# Patient Record
Sex: Female | Born: 1995 | Race: White | Hispanic: No | Marital: Married | Smoking: Never smoker
Health system: Southern US, Community
[De-identification: ages and names within clinical notes are randomized; demographics above are authoritative.]

## PROBLEM LIST (undated history)

## (undated) ENCOUNTER — Inpatient Hospital Stay (HOSPITAL_COMMUNITY): Payer: Self-pay

## (undated) DIAGNOSIS — T8859XA Other complications of anesthesia, initial encounter: Secondary | ICD-10-CM

## (undated) DIAGNOSIS — F419 Anxiety disorder, unspecified: Secondary | ICD-10-CM

## (undated) DIAGNOSIS — N281 Cyst of kidney, acquired: Secondary | ICD-10-CM

## (undated) DIAGNOSIS — E611 Iron deficiency: Secondary | ICD-10-CM

## (undated) DIAGNOSIS — T4145XA Adverse effect of unspecified anesthetic, initial encounter: Secondary | ICD-10-CM

## (undated) DIAGNOSIS — J45909 Unspecified asthma, uncomplicated: Secondary | ICD-10-CM

## (undated) DIAGNOSIS — N96 Recurrent pregnancy loss: Secondary | ICD-10-CM

## (undated) DIAGNOSIS — R760 Raised antibody titer: Secondary | ICD-10-CM

## (undated) DIAGNOSIS — F329 Major depressive disorder, single episode, unspecified: Secondary | ICD-10-CM

## (undated) DIAGNOSIS — Q899 Congenital malformation, unspecified: Secondary | ICD-10-CM

## (undated) DIAGNOSIS — T7840XA Allergy, unspecified, initial encounter: Secondary | ICD-10-CM

## (undated) DIAGNOSIS — R002 Palpitations: Secondary | ICD-10-CM

## (undated) DIAGNOSIS — E162 Hypoglycemia, unspecified: Secondary | ICD-10-CM

## (undated) DIAGNOSIS — Z8489 Family history of other specified conditions: Secondary | ICD-10-CM

## (undated) DIAGNOSIS — F32A Depression, unspecified: Secondary | ICD-10-CM

## (undated) DIAGNOSIS — D649 Anemia, unspecified: Secondary | ICD-10-CM

## (undated) DIAGNOSIS — D689 Coagulation defect, unspecified: Secondary | ICD-10-CM

## (undated) DIAGNOSIS — I499 Cardiac arrhythmia, unspecified: Secondary | ICD-10-CM

## (undated) DIAGNOSIS — R519 Headache, unspecified: Secondary | ICD-10-CM

## (undated) DIAGNOSIS — Z5189 Encounter for other specified aftercare: Secondary | ICD-10-CM

## (undated) DIAGNOSIS — R51 Headache: Secondary | ICD-10-CM

## (undated) DIAGNOSIS — K219 Gastro-esophageal reflux disease without esophagitis: Secondary | ICD-10-CM

## (undated) DIAGNOSIS — M792 Neuralgia and neuritis, unspecified: Secondary | ICD-10-CM

## (undated) HISTORY — PX: ABLATION: SHX5711

## (undated) HISTORY — DX: Major depressive disorder, single episode, unspecified: F32.9

## (undated) HISTORY — DX: Raised antibody titer: R76.0

## (undated) HISTORY — DX: Recurrent pregnancy loss: N96

## (undated) HISTORY — DX: Anxiety disorder, unspecified: F41.9

## (undated) HISTORY — DX: Anemia, unspecified: D64.9

## (undated) HISTORY — DX: Allergy, unspecified, initial encounter: T78.40XA

## (undated) HISTORY — DX: Coagulation defect, unspecified: D68.9

## (undated) HISTORY — DX: Neuralgia and neuritis, unspecified: M79.2

## (undated) HISTORY — DX: Iron deficiency: E61.1

## (undated) HISTORY — DX: Cyst of kidney, acquired: N28.1

## (undated) HISTORY — DX: Encounter for other specified aftercare: Z51.89

## (undated) HISTORY — PX: WISDOM TOOTH EXTRACTION: SHX21

## (undated) HISTORY — DX: Depression, unspecified: F32.A

---
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
## 2016-02-09 DIAGNOSIS — F411 Generalized anxiety disorder: Secondary | ICD-10-CM | POA: Insufficient documentation

## 2016-02-09 DIAGNOSIS — F330 Major depressive disorder, recurrent, mild: Secondary | ICD-10-CM | POA: Insufficient documentation

## 2016-04-22 DIAGNOSIS — N912 Amenorrhea, unspecified: Secondary | ICD-10-CM | POA: Insufficient documentation

## 2016-07-02 ENCOUNTER — Encounter: Payer: Self-pay | Admitting: Cardiology

## 2016-07-04 ENCOUNTER — Encounter: Payer: Self-pay | Admitting: *Deleted

## 2016-07-05 ENCOUNTER — Encounter: Payer: Self-pay | Admitting: *Deleted

## 2016-07-05 ENCOUNTER — Ambulatory Visit: Payer: Self-pay | Admitting: Cardiology

## 2016-07-10 ENCOUNTER — Encounter: Payer: Self-pay | Admitting: Cardiology

## 2016-07-10 ENCOUNTER — Ambulatory Visit (INDEPENDENT_AMBULATORY_CARE_PROVIDER_SITE_OTHER): Payer: BLUE CROSS/BLUE SHIELD | Admitting: Cardiology

## 2016-07-10 VITALS — BP 117/73 | HR 97 | Ht 64.0 in | Wt 122.0 lb

## 2016-07-10 DIAGNOSIS — R002 Palpitations: Secondary | ICD-10-CM | POA: Diagnosis not present

## 2016-07-10 DIAGNOSIS — Z136 Encounter for screening for cardiovascular disorders: Secondary | ICD-10-CM | POA: Diagnosis not present

## 2016-07-10 NOTE — Patient Instructions (Signed)
Your physician recommends that you schedule a follow-up appointment in: 6 weeks with Dr. Wyline MoodBranch  Your physician recommends that you continue on your current medications as directed. Please refer to the Current Medication list given to you today.  Your physician has recommended that you wear an event monitor FOR 30 DAYS. Event monitors are medical devices that record the heart's electrical activity. Doctors most often us these monitors to diagnose arrhythmias. Arrhythmias are problems with the speed or rhythm of the heartbeat. The monitor is a small, portable device. You can wear one while you do your normal daily activities. This is usually used to diagnose what is causing palpitations/syncope (passing out).  Thank you for choosing [facility]!!

## 2016-07-10 NOTE — Progress Notes (Signed)
        Clinical Summary [name] is a 21 y.o.female seen today as a new patient, she is referred by Dr Dimas AguasHoward for palpitations.   1. Palpitations. - started about 1 year ago. Can have episodes at rest or with activity. Has checked her heart rates during episodes and noted as high as  175 bpm, sharp pain left sided chest pain. Feels weak, drained. Lasts a few minutes - occurs sporadically, difficult to calculate frequency.  - no caffeine, no EtoH.    Past Medical History  Diagnosis Date  . Anxiety and depression      No Known Allergies   Current Outpatient Prescriptions  Medication Sig Dispense Refill  . busPIRone (BUSPAR) 5 MG tablet Take 5 mg by mouth 2 (two) times daily.    Marland Kitchen. FLUoxetine (PROZAC) 20 MG capsule Take 20 mg by mouth daily.  3   No current facility-administered medications for this visit.     No past surgical history on file.   No Known Allergies    No family history on file.   Social History [name] reports that she has never smoked. She has never used smokeless tobacco. [name] has no alcohol history on file.   Review of Systems CONSTITUTIONAL: No weight loss, fever, chills, weakness or fatigue.  HEENT: Eyes: No visual loss, blurred vision, double vision or yellow sclerae.No hearing loss, sneezing, congestion, runny nose or sore throat.  SKIN: No rash or itching.  CARDIOVASCULAR: per HPI RESPIRATORY: No shortness of breath, cough or sputum.  GASTROINTESTINAL: No anorexia, nausea, vomiting or diarrhea. No abdominal pain or blood.  GENITOURINARY: No burning on urination, no polyuria NEUROLOGICAL: No headache, dizziness, syncope, paralysis, ataxia, numbness or tingling in the extremities. No change in bowel or bladder control.  MUSCULOSKELETAL: No muscle, back pain, joint pain or stiffness.  LYMPHATICS: No enlarged nodes. No history of splenectomy.  PSYCHIATRIC: No history of depression or anxiety.  ENDOCRINOLOGIC: No reports of sweating,  cold or heat intolerance. No polyuria or polydipsia.  Marland Kitchen.   Physical Examination Filed Vitals:   07/10/16 1446  BP: 117/73  Pulse: 97   Filed Weights   07/10/16 1446  Weight: 122 lb (55.339 kg)    Gen: resting comfortably, no acute distress HEENT: no scleral icterus, pupils equal round and reactive, no palptable cervical adenopathy,  CV: RRR, no m/r/g,no jvd Resp: Clear to auscultation bilaterally GI: abdomen is soft, non-tender, non-distended, normal bowel sounds, no hepatosplenomegaly MSK: extremities are warm, no edema.  Skin: warm, no rash Neuro:  no focal deficits Psych: appropriate affect       Assessment and Plan  1. Palpitations - EKG in clinic shows NSR, normal intervals without preexcitation - we will obtain 30 day event monitor to further evaluate her symptoms      Antoine PocheJonathan F. Iren Whipp, M.D

## 2016-07-16 ENCOUNTER — Ambulatory Visit (INDEPENDENT_AMBULATORY_CARE_PROVIDER_SITE_OTHER): Payer: BLUE CROSS/BLUE SHIELD

## 2016-07-16 DIAGNOSIS — R002 Palpitations: Secondary | ICD-10-CM

## 2016-08-21 ENCOUNTER — Telehealth: Payer: Self-pay | Admitting: *Deleted

## 2016-08-21 NOTE — Telephone Encounter (Signed)
Pt aware and would like to keep 9/8 appt and discuss results with Dr. Wyline MoodBranch prior to starting new medication

## 2016-08-21 NOTE — Telephone Encounter (Signed)
-----   Message from Lesle ChrisAngela G Hill, LPN sent at 1/61/09608/21/2017  3:44 PM EDT -----   ----- Message ----- From: Antoine PocheJonathan F Branch, MD Sent: 08/20/2016   3:43 PM To: Lesle ChrisAngela G Hill, LPN  Heart monitor overall looks good, no serious abnormal rhythms. If she is still having palpitations we can try starting lopressor 12.5mg  bid and follow symptoms. Have her f/u with me in 2 months. She can always update us with a call about her symptoms.   J BrancH MD

## 2016-08-23 ENCOUNTER — Ambulatory Visit: Payer: BLUE CROSS/BLUE SHIELD | Admitting: Cardiology

## 2016-08-30 ENCOUNTER — Telehealth: Payer: Self-pay | Admitting: Cardiology

## 2016-08-30 MED ORDER — METOPROLOL TARTRATE 25 MG PO TABS: 12.5000 mg | ORAL_TABLET | Freq: Two times a day (BID) | ORAL | 3 refills | Status: DC

## 2016-08-30 NOTE — Telephone Encounter (Signed)
Patient's grandfather passed this past Sunday from heart disease  no one knew about.   She would like to go ahead and start medication that was discussed

## 2016-08-30 NOTE — Telephone Encounter (Signed)
Pt wants to start Lopressor 12.5 mg bid as per 8/22 phone note and push back appt for 2 months grandfather passed away from undiagnosed heart problem. Rescheduled for October and sent medication to pharmacy. Will forward to Dr. Melony OverlyBranch FYI

## 2016-08-31 NOTE — Telephone Encounter (Signed)
Patient notified and verbalized understanding.  Follow up scheduled for 10/15/16 with Dr. Wyline MoodBranch.

## 2016-08-31 NOTE — Telephone Encounter (Signed)
I am sorry to hear about her loss. I want to reassure that her symptoms of palpitations are very common in young healty patients, and that the heart monitor overall looked good. The medication is mainly to see if it helps with symptoms of heart racing, we will see if it helps  Carol FerryJ Makyra Corprew MD

## 2016-09-07 ENCOUNTER — Ambulatory Visit: Payer: BLUE CROSS/BLUE SHIELD | Admitting: Cardiology

## 2016-09-09 ENCOUNTER — Emergency Department (HOSPITAL_COMMUNITY)
Admission: EM | Admit: 2016-09-09 | Discharge: 2016-09-09 | Disposition: A | Discharge status: Home / Self Care | Payer: BLUE CROSS/BLUE SHIELD | Attending: Emergency Medicine | Admitting: Emergency Medicine

## 2016-09-09 ENCOUNTER — Encounter (HOSPITAL_COMMUNITY): Payer: Self-pay | Admitting: Emergency Medicine

## 2016-09-09 DIAGNOSIS — Z79899 Other long term (current) drug therapy: Secondary | ICD-10-CM | POA: Insufficient documentation

## 2016-09-09 DIAGNOSIS — R51 Headache: Secondary | ICD-10-CM | POA: Diagnosis not present

## 2016-09-09 DIAGNOSIS — R112 Nausea with vomiting, unspecified: Secondary | ICD-10-CM | POA: Diagnosis not present

## 2016-09-09 HISTORY — DX: Congenital malformation, unspecified: Q89.9

## 2016-09-09 LAB — LIPASE, BLOOD: LIPASE: 31 U/L (ref 11–51)

## 2016-09-09 LAB — CBC WITH DIFFERENTIAL/PLATELET
BASOS PCT: 0 %
Basophils Absolute: 0 10*3/uL (ref 0.0–0.1)
EOS ABS: 0.2 10*3/uL (ref 0.0–0.7)
Eosinophils Relative: 2 %
HCT: 33 % — ABNORMAL LOW (ref 36.0–46.0)
Hemoglobin: 10.6 g/dL — ABNORMAL LOW (ref 12.0–15.0)
Lymphocytes Relative: 14 %
Lymphs Abs: 1.1 10*3/uL (ref 0.7–4.0)
MCH: 25.2 pg — ABNORMAL LOW (ref 26.0–34.0)
MCHC: 32.1 g/dL (ref 30.0–36.0)
MCV: 78.4 fL (ref 78.0–100.0)
MONO ABS: 0.4 10*3/uL (ref 0.1–1.0)
MONOS PCT: 4 %
NEUTROS PCT: 80 %
Neutro Abs: 6.6 10*3/uL (ref 1.7–7.7)
Platelets: 188 10*3/uL (ref 150–400)
RBC: 4.21 MIL/uL (ref 3.87–5.11)
RDW: 13.2 % (ref 11.5–15.5)
WBC: 8.2 10*3/uL (ref 4.0–10.5)

## 2016-09-09 LAB — PREGNANCY, URINE: Preg Test, Ur: NEGATIVE

## 2016-09-09 LAB — URINALYSIS, ROUTINE W REFLEX MICROSCOPIC
Bilirubin Urine: NEGATIVE
Glucose, UA: NEGATIVE mg/dL
HGB URINE DIPSTICK: NEGATIVE
Ketones, ur: NEGATIVE mg/dL
Leukocytes, UA: NEGATIVE
Nitrite: NEGATIVE
PH: 8 (ref 5.0–8.0)
Protein, ur: NEGATIVE mg/dL
SPECIFIC GRAVITY, URINE: 1.01 (ref 1.005–1.030)

## 2016-09-09 LAB — COMPREHENSIVE METABOLIC PANEL
ALK PHOS: 41 U/L (ref 38–126)
ALT: 17 U/L (ref 14–54)
AST: 19 U/L (ref 15–41)
Albumin: 4.8 g/dL (ref 3.5–5.0)
Anion gap: 9 (ref 5–15)
BUN: 18 mg/dL (ref 6–20)
CALCIUM: 8.9 mg/dL (ref 8.9–10.3)
CO2: 25 mmol/L (ref 22–32)
CREATININE: 0.65 mg/dL (ref 0.44–1.00)
Chloride: 103 mmol/L (ref 101–111)
GFR calc non Af Amer: >60 mL/min (ref >60)
GLUCOSE: 99 mg/dL (ref 65–99)
Potassium: 3.7 mmol/L (ref 3.5–5.1)
SODIUM: 137 mmol/L (ref 135–145)
Total Bilirubin: 0.9 mg/dL (ref 0.3–1.2)
Total Protein: 7.6 g/dL (ref 6.5–8.1)

## 2016-09-09 LAB — I-STAT BETA HCG BLOOD, ED (MC, WL, AP ONLY)

## 2016-09-09 MED ORDER — METOCLOPRAMIDE HCL 5 MG/ML IJ SOLN
10.0000 mg | Freq: Once | INTRAMUSCULAR | Status: AC
  Administered 2016-09-09: 10 mg via INTRAVENOUS
  Filled 2016-09-09: qty 2

## 2016-09-09 MED ORDER — SODIUM CHLORIDE 0.9 % IV BOLUS (SEPSIS)
1000.0000 mL | Freq: Once | INTRAVENOUS | Status: AC
  Administered 2016-09-09: 1000 mL via INTRAVENOUS

## 2016-09-09 MED ORDER — ONDANSETRON HCL 4 MG PO TABS: 4.0000 mg | ORAL_TABLET | Freq: Four times a day (QID) | ORAL | 0 refills | Status: DC

## 2016-09-09 MED ORDER — ONDANSETRON HCL 4 MG/2ML IJ SOLN
4.0000 mg | Freq: Once | INTRAMUSCULAR | Status: AC
  Administered 2016-09-09: 4 mg via INTRAVENOUS
  Filled 2016-09-09: qty 2

## 2016-09-09 MED ORDER — KETOROLAC TROMETHAMINE 30 MG/ML IJ SOLN
30.0000 mg | Freq: Once | INTRAMUSCULAR | Status: AC
  Administered 2016-09-09: 30 mg via INTRAVENOUS
  Filled 2016-09-09: qty 1

## 2016-09-09 NOTE — ED Notes (Signed)
Pt dosnt want family to be told about any medical information or results.

## 2016-09-09 NOTE — ED Notes (Signed)
Pt. Verbalized understanding of discharge.

## 2016-09-09 NOTE — ED Triage Notes (Signed)
Pt woke up this morning with N/V.  Pt says she thinks she might be pregnant [facility] On the way she had to pull over the car to throw up.

## 2016-09-09 NOTE — Discharge Instructions (Signed)
Frequent sips of fluids today.  Bland diet as tolerated this evening.  Follow-up with your doctor for recheck if needed.

## 2016-09-09 NOTE — ED Notes (Signed)
MD at bedside. 

## 2016-09-12 NOTE — ED Provider Notes (Signed)
[facility]PT Provider Note   CSN: [id number] Arrival date & time: 09/09/16  4098     History   Chief Complaint Chief Complaint  Patient presents with  . Nausea    N/V/D and headache    HPI Carol Bernard is a 21 y.o. female.  HPI  Carol Bernard is a 21 y.o. female who presents to the Emergency Department complaining of nausea and vomiting.  States she woke up on the morning of arrival with several episodes of vomiting.  She states that she is concerned that she may be pregnant, but also admits to eating take out food on the night prior that may have made her sick. Also c/o of slight frontal headache of gradual onset that began after vomiting.   She denies diarrhea, fever, dysuria, abdominal pain or pelvic pain, neck pain or stiffness and visual changes.    Past Medical History:  Diagnosis Date  . Anxiety and depression   . Birth defect     There are no active problems to display for this patient.   Past Surgical History:  Procedure Laterality Date  . WISDOM TOOTH EXTRACTION      OB History    Gravida Para Term Preterm AB Living   1       1     SAB TAB Ectopic Multiple Live Births   1               Home Medications    Prior to Admission medications   Medication Sig Start Date End Date Taking? Authorizing Provider  busPIRone (BUSPAR) 5 MG tablet Take 5 mg by mouth 2 (two) times daily.   Yes Historical Provider, MD  diclofenac (VOLTAREN) 75 MG EC tablet Take 1 tablet by mouth 2 (two) times daily. 08/21/16  Yes Historical Provider, MD  FLUoxetine (PROZAC) 20 MG capsule Take 20 mg by mouth daily. 05/25/16  Yes Historical Provider, MD  metoprolol tartrate (LOPRESSOR) 25 MG tablet Take 0.5 tablets (12.5 mg total) by mouth 2 (two) times daily. 08/30/16 11/28/16 Yes Antoine Poche, MD  ondansetron (ZOFRAN) 4 MG tablet Take 1 tablet (4 mg total) by mouth every 6 (six) hours. 09/09/16   Bhavya Grand, PA-C    Family History No family history on file.  Social  History Social History  Substance Use Topics  . Smoking status: Never Smoker  . Smokeless tobacco: Never Used  . Alcohol use No     Allergies   Review of patient's allergies indicates no known allergies.   Review of Systems Review of Systems  Constitutional: Negative for appetite change, chills and fever.  Respiratory: Negative for chest tightness and shortness of breath.   Cardiovascular: Negative for chest pain.  Gastrointestinal: Positive for nausea and vomiting. Negative for abdominal pain, blood in stool and constipation.  Genitourinary: Negative for decreased urine volume, difficulty urinating, dysuria, flank pain, vaginal bleeding and vaginal pain.  Musculoskeletal: Negative for back pain.  Skin: Negative for color change and rash.  Neurological: Positive for headaches. Negative for dizziness, weakness and numbness.  Hematological: Negative for adenopathy.  Psychiatric/Behavioral: Negative for confusion.  All other systems reviewed and are negative.    Physical Exam Updated Vital Signs BP 108/65 (BP Location: Right Arm)   Pulse 75   Temp 98.4 F (36.9 C) (Oral)   Resp 18   Ht 5' 3.5" (1.613 m)   Wt 53 kg   LMP 08/12/2016   SpO2 100%   BMI 20.37 kg/m  Physical Exam  Constitutional: She is oriented to person, place, and time. She appears well-developed and well-nourished. No distress.  HENT:  Head: Normocephalic and atraumatic.  Mouth/Throat: Oropharynx is clear and moist.  Neck: Normal range of motion. No Kernig's sign noted.  Cardiovascular: Normal rate, regular rhythm and intact distal pulses.   No murmur heard. Pulmonary/Chest: Effort normal and breath sounds normal. No respiratory distress. She exhibits no tenderness.  Abdominal: Soft. Bowel sounds are normal. She exhibits no distension and no mass. There is no tenderness. There is no rebound and no guarding.  Musculoskeletal: Normal range of motion. She exhibits no edema.  Neurological: She is alert  and oriented to person, place, and time. She exhibits normal muscle tone. Coordination normal.  Skin: Skin is warm and dry.  Psychiatric: She has a normal mood and affect.  Nursing note and vitals reviewed.    ED Treatments / Results  Labs (all labs ordered are listed, but only abnormal results are displayed) Labs Reviewed  CBC WITH DIFFERENTIAL/PLATELET - Abnormal; Notable for the following:       Result Value   Hemoglobin 10.6 (*)    HCT 33.0 (*)    MCH 25.2 (*)    All other components within normal limits  PREGNANCY, URINE  URINALYSIS, ROUTINE W REFLEX MICROSCOPIC (NOT AT [facility])  COMPREHENSIVE METABOLIC PANEL  LIPASE, BLOOD  I-STAT BETA HCG BLOOD, ED ([facility], [facility], [facility] ONLY)    EKG  EKG Interpretation None       Radiology No results found.  Procedures Procedures (including critical care time)  Medications Ordered in ED Medications  sodium chloride 0.9 % bolus 1,000 mL (0 mLs Intravenous Stopped 09/09/16 1133)  ondansetron (ZOFRAN) injection 4 mg (4 mg Intravenous Given 09/09/16 1012)  ketorolac (TORADOL) 30 MG/ML injection 30 mg (30 mg Intravenous Given 09/09/16 1217)  metoCLOPramide (REGLAN) injection 10 mg (10 mg Intravenous Given 09/09/16 1217)     Initial Impression / Assessment and Plan / ED Course  I have reviewed the triage vital signs and the nursing notes.  Pertinent labs & imaging results that were available during my care of the patient were reviewed by me and considered in my medical decision making (see chart for details).  Clinical Course    Pt with several episodes of vomiting.  Abdomen is soft, non-tender.  Pt is non-toxic appearing.  No concerning sx's for surgical abdomen.   On recheck, pt is feeling better after IVF's and anti emetic.  Has ambulated to restroom w/o difficulty.  Sx's likely viral.  Has tolerated po fluids, appears stable for d/c.  Return precautions given.    Final Clinical Impressions(s) / ED Diagnoses   Final diagnoses:    Non-intractable vomiting with nausea, vomiting of unspecified type    New Prescriptions Discharge Medication List as of 09/09/2016 12:53 PM    START taking these medications   Details  ondansetron (ZOFRAN) 4 MG tablet Take 1 tablet (4 mg total) by mouth every 6 (six) hours., Starting Sun 09/09/2016, Print         [employer] Gyasi Hazzard, PA-C 09/12/16 [id number]    Loren Raceravid Yelverton, MD 09/13/16 [phone]

## 2016-10-15 ENCOUNTER — Encounter: Payer: Self-pay | Admitting: Cardiology

## 2016-10-15 ENCOUNTER — Ambulatory Visit (INDEPENDENT_AMBULATORY_CARE_PROVIDER_SITE_OTHER): Payer: BLUE CROSS/BLUE SHIELD | Admitting: Cardiology

## 2016-10-15 VITALS — BP 106/71 | HR 79 | Ht 64.0 in | Wt 124.0 lb

## 2016-10-15 DIAGNOSIS — R002 Palpitations: Secondary | ICD-10-CM | POA: Diagnosis not present

## 2016-10-15 MED ORDER — METOPROLOL TARTRATE 25 MG PO TABS: 25.0000 mg | ORAL_TABLET | Freq: Two times a day (BID) | ORAL | Status: DC

## 2016-10-15 NOTE — Patient Instructions (Addendum)
Medication Instructions:   Increase Lopressor to 25mg  twice a day, may take an extra tablet as needed for palpitations   Continue all other medications.    Labwork: none  Testing/Procedures: none  Follow-Up: Your physician wants you to follow up in:  4 months.  You will receive a reminder letter in the mail one-two months in advance.  If you don't receive a letter, please call our office to schedule the follow up appointment   Any Other Special Instructions Will Be Listed Below (If Applicable).  If you need a refill on your cardiac medications before your next appointment, please call your pharmacy.

## 2016-10-15 NOTE — Progress Notes (Signed)
Clinical Summary Carol Bernard is a 21 y.o.female seen today for follow up of the following medical problems.   1. Palpitations. - started about 1 year ago. Can have episodes at rest or with activity. Has checked her heart rates during episodes and noted as high as  175 bpm, sharp pain left sided chest pain. Feels weak, drained. Lasts a few minutes - occurs sporadically, difficult to calculate frequency.  - no caffeine, no EtoH.  - TSH 1.59   - 06/2016 event monitor without significant arrhythmias.  - started lopressor 12.5mg  bid. Has not helped symptoms. Still with episodes of palpitations  2. Anxiety/Depression - followed by pcp    Past Medical History:  Diagnosis Date  . Anxiety and depression   . Birth defect      No Known Allergies   Current Outpatient Prescriptions  Medication Sig Dispense Refill  . busPIRone (BUSPAR) 5 MG tablet Take 5 mg by mouth 2 (two) times daily.    . diclofenac (VOLTAREN) 75 MG EC tablet Take 1 tablet by mouth 2 (two) times daily.  0  . FLUoxetine (PROZAC) 20 MG capsule Take 20 mg by mouth daily.  3  . metoprolol tartrate (LOPRESSOR) 25 MG tablet Take 0.5 tablets (12.5 mg total) by mouth 2 (two) times daily. 180 tablet 3  . ondansetron (ZOFRAN) 4 MG tablet Take 1 tablet (4 mg total) by mouth every 6 (six) hours. 12 tablet 0   No current facility-administered medications for this visit.      Past Surgical History:  Procedure Laterality Date  . WISDOM TOOTH EXTRACTION       No Known Allergies    No family history on file.   Social History Carol Bernard reports that she has never smoked. She has never used smokeless tobacco. Carol Bernard reports that she does not drink alcohol.   Review of Systems CONSTITUTIONAL: No weight loss, fever, chills, weakness or fatigue.  HEENT: Eyes: No visual loss, blurred vision, double vision or yellow sclerae.No hearing loss, sneezing, congestion, runny nose or sore throat.  SKIN: No rash or itching.    CARDIOVASCULAR: per HPI RESPIRATORY: No shortness of breath, cough or sputum.  GASTROINTESTINAL: No anorexia, nausea, vomiting or diarrhea. No abdominal pain or blood.  GENITOURINARY: No burning on urination, no polyuria NEUROLOGICAL: No headache, dizziness, syncope, paralysis, ataxia, numbness or tingling in the extremities. No change in bowel or bladder control.  MUSCULOSKELETAL: No muscle, back pain, joint pain or stiffness.  LYMPHATICS: No enlarged nodes. No history of splenectomy.  PSYCHIATRIC: +depression, anxiety ENDOCRINOLOGIC: No reports of sweating, cold or heat intolerance. No polyuria or polydipsia.  Marland Kitchen   Physical Examination Vitals:   10/15/16 1552  BP: 106/71  Pulse: 79   Vitals:   10/15/16 1552  Weight: 124 lb (56.2 kg)  Height: 5\' 4"  (1.626 m)    Gen: resting comfortably, no acute distress HEENT: no scleral icterus, pupils equal round and reactive, no palptable cervical adenopathy,  CV: RRR, no m/r/g, no jvd Resp: Clear to auscultation bilaterally GI: abdomen is soft, non-tender, non-distended, normal bowel sounds, no hepatosplenomegaly MSK: extremities are warm, no edema.  Skin: warm, no rash Neuro:  no focal deficits Psych: appropriate affect   Diagnostic Studies  06/2016 event monitor  Telemetry tracings show sinus rhythm and sinus tachycardia  No symptoms reported  No significant arrhythmias   Assessment and Plan   1. Palpitations - no significant arrhythmias on monitor - we will increase lopressor to 25mg  bid  F/u 4 months   Antoine PocheJonathan F. Branch, M.D.

## 2017-01-04 ENCOUNTER — Other Ambulatory Visit: Payer: Self-pay | Admitting: Obstetrics and Gynecology

## 2017-01-04 DIAGNOSIS — O3680X0 Pregnancy with inconclusive fetal viability, not applicable or unspecified: Secondary | ICD-10-CM

## 2017-01-07 ENCOUNTER — Ambulatory Visit (INDEPENDENT_AMBULATORY_CARE_PROVIDER_SITE_OTHER): Payer: BLUE CROSS/BLUE SHIELD

## 2017-01-07 ENCOUNTER — Other Ambulatory Visit: Payer: Self-pay | Admitting: Obstetrics and Gynecology

## 2017-01-07 ENCOUNTER — Ambulatory Visit (INDEPENDENT_AMBULATORY_CARE_PROVIDER_SITE_OTHER): Payer: BLUE CROSS/BLUE SHIELD | Admitting: Women's Health

## 2017-01-07 ENCOUNTER — Encounter: Payer: Self-pay | Admitting: Women's Health

## 2017-01-07 VITALS — BP 125/71 | HR 91 | Ht 64.0 in | Wt 120.0 lb

## 2017-01-07 DIAGNOSIS — O200 Threatened abortion: Secondary | ICD-10-CM

## 2017-01-07 DIAGNOSIS — R112 Nausea with vomiting, unspecified: Secondary | ICD-10-CM

## 2017-01-07 DIAGNOSIS — O3680X0 Pregnancy with inconclusive fetal viability, not applicable or unspecified: Secondary | ICD-10-CM

## 2017-01-07 DIAGNOSIS — Z3A08 8 weeks gestation of pregnancy: Secondary | ICD-10-CM | POA: Diagnosis not present

## 2017-01-07 MED ORDER — PROMETHAZINE HCL 25 MG PO TABS: 12.5000 mg | ORAL_TABLET | Freq: Four times a day (QID) | ORAL | 0 refills | Status: DC | PRN

## 2017-01-07 NOTE — Progress Notes (Signed)
US TA/TV: 8+2 wks GS,w/ys,crl 1.3 mm correlates to 5+2 wks fetal pole,no FHT,normal ov's bilat,pt is seeing Kim following ultrasound,

## 2017-01-07 NOTE — Progress Notes (Signed)
   [facility] Visit  Patient name: Carol Bernard MRN [id number]  Date of birth: 1995/04/20  CC & HPI:  Carol Bernard is a 22 y.o. [id number] Caucasian female presenting today for dating u/s only, but was found to have some discrepancies on u/s. Certain LMP of 11/05/16 placed GA @ 7873w4d, GS measured 30.628mm c/w 6627w2d, and CRL of 1.513mm c/w 2843w2d w/o FCA, +intrauterine YS. Pt denies any cramping or vb. Had bhcg drawn at her hospital last Monday just to have it done and was 57,126. Still having n/v, has rx for zofran, states she's only taken 1-2 times and doesn't help. Requests rx for nausea med.  Patient's last menstrual period was 11/15/2016 (exact date).  Pertinent History Reviewed:  Medical & Surgical Hx:   Past medical, surgical, family, and social history reviewed in electronic medical record Medications: Reviewed & Updated - see associated section Allergies: Reviewed in electronic medical record  Objective Findings:  Vitals: BP 125/71 (BP Location: Right Arm, Patient Position: Sitting, Cuff Size: Normal)   Pulse 91   Ht 5\' 4"  (1.626 m)   Wt 120 lb (54.4 kg)   LMP 11/15/2016 (Exact Date)   BMI 20.60 kg/m  Body mass index is 20.6 kg/m.  Physical Examination: General appearance - crying, upset  No results found for this or any previous visit (from the past 24 hour(s)).   Assessment & Plan:  A:   Likely miscarriage  4573w4d by LMP, GS c/w 1327w2d, CRL c/w 7243w2d w/o FCA, +intrauterine YS  N/V  P:  CBC, BHCG, Group & Type today  Rx phenergan for n/v  Discussed s/s SAB  Return for 2d for bhcg, then 1/19 for early ob u/s and f/u w/ me.  Marge DuncansBooker, Carol Bernard CNM, [facility]NP-BC 01/07/2017 1:58 PM

## 2017-01-08 ENCOUNTER — Telehealth: Payer: Self-pay | Admitting: Women's Health

## 2017-01-08 LAB — CBC
HEMATOCRIT: 37.1 % (ref 34.0–46.6)
Hemoglobin: 12.7 g/dL (ref 11.1–15.9)
MCH: 26.5 pg — ABNORMAL LOW (ref 26.6–33.0)
MCHC: 34.2 g/dL (ref 31.5–35.7)
MCV: 78 fL — AB (ref 79–97)
Platelets: 208 10*3/uL (ref 150–379)
RBC: 4.79 x10E6/uL (ref 3.77–5.28)
RDW: 17.2 % — ABNORMAL HIGH (ref 12.3–15.4)
WBC: 9.7 10*3/uL (ref 3.4–10.8)

## 2017-01-08 LAB — ABO AND RH: Rh Factor: POSITIVE

## 2017-01-08 LAB — BETA HCG QUANT (REF LAB): HCG QUANT: 102828 m[IU]/mL

## 2017-01-08 NOTE — Telephone Encounter (Signed)
Pt informed of BHCG results, instructed to [facility] BHCG and next Friday for U/S and OV with Kim.  Pt verbalized understanding.

## 2017-01-09 ENCOUNTER — Telehealth: Payer: Self-pay | Admitting: Women's Health

## 2017-01-09 ENCOUNTER — Other Ambulatory Visit: Payer: BLUE CROSS/BLUE SHIELD

## 2017-01-09 DIAGNOSIS — Z3042 Encounter for surveillance of injectable contraceptive: Secondary | ICD-10-CM

## 2017-01-09 LAB — BETA HCG QUANT (REF LAB): hCG Quant: 84907 m[IU]/mL

## 2017-01-09 MED ORDER — MISOPROSTOL 200 MCG PO TABS: 800.0000 ug | ORAL_TABLET | Freq: Once | ORAL | 1 refills | Status: DC

## 2017-01-09 MED ORDER — NAPROXEN 250 MG PO TABS: 250.0000 mg | ORAL_TABLET | Freq: Two times a day (BID) | ORAL | 0 refills | Status: DC | PRN

## 2017-01-09 NOTE — Telephone Encounter (Signed)
Pt notified of dropping hcg to 84,907 from 102,828 2 days ago c/w missed Ab. No spotting/cramping. Has appt for u/s scheduled for next Friday, discussed no benefit of continuing w/ u/s. Gave options of expectant management vs. Cytotec. Wants to think about it and discuss w/ partner, will call back.  Cheral MarkerKimberly R. Ellory Khurana, CNM, WHNP-BC 01/09/2017 2:02 PM

## 2017-01-09 NOTE — Telephone Encounter (Signed)
Returned pt's call, wants cytotec sent to pharmacy. Discussed use, can take orally or vaginally. If no results in 48hrs can get refilled and take again. Unable to take pain meds w/ tylenol as it makes liver enzymes go up, will also rx naprosyn 250mg  take 1-2 q 12hrs prn pain. F/U in 1wk- will call back to schedule. Reviewed labs/sono and poc w/ JVF who agrees.  Cheral MarkerKimberly R. Towanna Avery, CNM, [facility] 01/09/2017 4:55 PM

## 2017-01-10 ENCOUNTER — Telehealth: Payer: Self-pay | Admitting: *Deleted

## 2017-01-10 NOTE — Telephone Encounter (Signed)
Pt called and wanted to know if she could get a copy of the U/S picture from Monday.  [facility] and she will print her the picture and leave it at the front desk for pt to pick up, pt verbalized understanding.

## 2017-01-18 ENCOUNTER — Other Ambulatory Visit: Payer: BLUE CROSS/BLUE SHIELD

## 2017-01-18 ENCOUNTER — Encounter: Payer: BLUE CROSS/BLUE SHIELD | Admitting: Women's Health

## 2017-01-22 ENCOUNTER — Telehealth: Payer: Self-pay | Admitting: Women's Health

## 2017-01-22 ENCOUNTER — Emergency Department (HOSPITAL_COMMUNITY)
Admission: EM | Admit: 2017-01-22 | Discharge: 2017-01-22 | Disposition: A | Discharge status: Home / Self Care | Payer: BLUE CROSS/BLUE SHIELD | Attending: Emergency Medicine | Admitting: Emergency Medicine

## 2017-01-22 ENCOUNTER — Encounter (HOSPITAL_COMMUNITY): Payer: Self-pay

## 2017-01-22 DIAGNOSIS — O039 Complete or unspecified spontaneous abortion without complication: Secondary | ICD-10-CM | POA: Insufficient documentation

## 2017-01-22 DIAGNOSIS — Z79899 Other long term (current) drug therapy: Secondary | ICD-10-CM | POA: Diagnosis not present

## 2017-01-22 DIAGNOSIS — O219 Vomiting of pregnancy, unspecified: Secondary | ICD-10-CM | POA: Diagnosis present

## 2017-01-22 HISTORY — DX: Palpitations: R00.2

## 2017-01-22 HISTORY — DX: Hypoglycemia, unspecified: E16.2

## 2017-01-22 LAB — COMPREHENSIVE METABOLIC PANEL
ALK PHOS: 35 U/L — AB (ref 38–126)
ALT: 12 U/L — ABNORMAL LOW (ref 14–54)
ANION GAP: 8 (ref 5–15)
AST: 17 U/L (ref 15–41)
Albumin: 4.6 g/dL (ref 3.5–5.0)
BILIRUBIN TOTAL: 1.1 mg/dL (ref 0.3–1.2)
BUN: 7 mg/dL (ref 6–20)
CALCIUM: 9.4 mg/dL (ref 8.9–10.3)
CO2: 22 mmol/L (ref 22–32)
Chloride: 101 mmol/L (ref 101–111)
Creatinine, Ser: 0.52 mg/dL (ref 0.44–1.00)
GFR calc non Af Amer: >60 mL/min (ref >60)
Glucose, Bld: 91 mg/dL (ref 65–99)
Potassium: 3.7 mmol/L (ref 3.5–5.1)
Sodium: 131 mmol/L — ABNORMAL LOW (ref 135–145)
TOTAL PROTEIN: 8.1 g/dL (ref 6.5–8.1)

## 2017-01-22 LAB — LIPASE, BLOOD: LIPASE: 26 U/L (ref 11–51)

## 2017-01-22 LAB — CBC
HCT: 37.9 % (ref 36.0–46.0)
HEMOGLOBIN: 12.9 g/dL (ref 12.0–15.0)
MCH: 27 pg (ref 26.0–34.0)
MCHC: 34 g/dL (ref 30.0–36.0)
MCV: 79.5 fL (ref 78.0–100.0)
Platelets: 187 10*3/uL (ref 150–400)
RBC: 4.77 MIL/uL (ref 3.87–5.11)
RDW: 15 % (ref 11.5–15.5)
WBC: 10.5 10*3/uL (ref 4.0–10.5)

## 2017-01-22 NOTE — ED Notes (Signed)
Patient given ice chips. 

## 2017-01-22 NOTE — ED Provider Notes (Signed)
[facility] DEPT Provider Note   CSN: [id number] Arrival date & time: 01/22/17  1738  By signing my name below, I, Bobbie Stack, attest that this documentation has been prepared under the direction and in the presence of Vanetta Mulders, MD. Electronically Signed: Bobbie Stack, Scribe. 01/22/17. 10:23 PM. History   Chief Complaint Chief Complaint  Patient presents with  . Weakness  . Nausea    HPI Carol Bernard is a 22 y.o. female.  She presents today with abdominal pain and weakness since 01/07/2017. On January 8th she was seen at [facility] for a threatening miscarriage. She was given Cytotec to assist with ending her pregnancy. She began spotting and passing blood shortly after. She also reports associated nausea with some vomiting. She called her OB today and they recommended that she come into the ED for treatment. Her last known menstrual cycle was on November 16th. This was her first pregnancy.  The history is provided by the patient and a friend. No language interpreter was used.  Weakness  Primary symptoms include no visual change. There has been no fever. Associated symptoms include chest pain and vomiting. Pertinent negatives include no shortness of breath and no confusion.     Past Medical History:  Diagnosis Date  . Anxiety and depression   . Birth defect   . Hypoglycemia   . Palpitations     Patient Active Problem List   Diagnosis Date Noted  . Threatened miscarriage 01/07/2017    Past Surgical History:  Procedure Laterality Date  . WISDOM TOOTH EXTRACTION      OB History    Gravida Para Term Preterm AB Living   2       1     SAB TAB Ectopic Multiple Live Births   1               Home Medications    Prior to Admission medications   Medication Sig Start Date End Date Taking? Authorizing Provider  busPIRone (BUSPAR) 5 MG tablet Take 5 mg by mouth 2 (two) times daily.   Yes Historical Provider, MD  diazepam (VALIUM) 5 MG tablet Take 5 mg by mouth 2  (two) times daily as needed for anxiety.   Yes Historical Provider, MD  metoprolol tartrate (LOPRESSOR) 25 MG tablet Take 1 tablet (25 mg total) by mouth 2 (two) times daily. (May take an extra 1 tab as needed for palpitations.) 10/15/16  Yes Antoine Poche, MD  naproxen (NAPROSYN) 250 MG tablet Take 1-2 tablets (250-500 mg total) by mouth every 12 (twelve) hours as needed for moderate pain. 01/09/17  Yes Cheral Marker, CNM  Prenatal Multivit-Min-Fe-FA (PRENATAL VITAMINS PO) Take 1 tablet by mouth daily.    Yes Historical Provider, MD  promethazine (PHENERGAN) 25 MG tablet Take 0.5-1 tablets (12.5-25 mg total) by mouth every 6 (six) hours as needed for nausea or vomiting. 01/07/17  Yes Cheral Marker, CNM  venlafaxine XR (EFFEXOR-XR) 37.5 MG 24 hr capsule Take 37.5 mg by mouth daily with breakfast.   Yes Historical Provider, MD  misoprostol (CYTOTEC) 200 MCG tablet Take 4 tablets (800 mcg total) by mouth once. Patient not taking: Reported on 01/22/2017 01/09/17 01/09/17  Cheral Marker, CNM  ondansetron (ZOFRAN) 4 MG tablet Take 1 tablet (4 mg total) by mouth every 6 (six) hours. Patient not taking: Reported on 01/07/2017 09/09/16   Pauline Aus, PA-C    Family History Family History  Problem Relation Age of Onset  . Hypertension Maternal  Grandmother   . Skin cancer Maternal Grandmother   . Heart disease Maternal Grandfather     Social History Social History  Substance Use Topics  . Smoking status: Never Smoker  . Smokeless tobacco: Never Used  . Alcohol use No     Allergies   Pecan nut (diagnostic) and Reglan [metoclopramide]   Review of Systems Review of Systems  Constitutional: Negative for fever.  HENT: Negative for rhinorrhea and sore throat.   Eyes: Negative for visual disturbance.  Respiratory: Negative for cough and shortness of breath.   Cardiovascular: Positive for chest pain.  Gastrointestinal: Positive for abdominal pain, nausea and vomiting.  Genitourinary:  Negative for difficulty urinating and hematuria.  Musculoskeletal: Positive for back pain (Lower). Negative for joint swelling.  Skin: Negative for rash.  Neurological: Positive for weakness.  Hematological: Does not bruise/bleed easily.  Psychiatric/Behavioral: Negative for confusion.  All other systems reviewed and are negative.    Physical Exam Updated Vital Signs BP 113/65 (BP Location: Right Arm)   Pulse 101   Temp 98.4 F (36.9 C)   Resp 18   Ht 5\' 4"  (1.626 m)   Wt 55.3 kg   LMP 11/15/2016 (Exact Date)   SpO2 100%   BMI 20.94 kg/m   Physical Exam  Constitutional: She is oriented to person, place, and time. She appears well-nourished. No distress.  HENT:  Head: Normocephalic and atraumatic.  Eyes: Pupils are equal, round, and reactive to light. No scleral icterus.  Neck: Normal range of motion. Neck supple.  Cardiovascular: Normal rate and regular rhythm.   No murmur heard. No swelling in ankles.  Pulmonary/Chest: Effort normal and breath sounds normal.  Abdominal: Bowel sounds are normal. There is no tenderness.  Musculoskeletal: Normal range of motion.  Neurological: She is alert and oriented to person, place, and time. No cranial nerve deficit.  Skin: Skin is warm and dry.     ED Treatments / Results  DIAGNOSTIC STUDIES: Oxygen Saturation is 100% on RA, normal by my interpretation.    COORDINATION OF CARE: 10:21 PM Discussed treatment plan with pt at bedside and pt agreed to plan.  Labs (all labs ordered are listed, but only abnormal results are displayed) Labs Reviewed  COMPREHENSIVE METABOLIC PANEL - Abnormal; Notable for the following:       Result Value   Sodium 131 (*)    ALT 12 (*)    Alkaline Phosphatase 35 (*)    All other components within normal limits  LIPASE, BLOOD  CBC  URINALYSIS, ROUTINE W REFLEX MICROSCOPIC    EKG  EKG Interpretation None       Radiology No results found.  Procedures Procedures (including critical care  time)  Medications Ordered in ED Medications - No data to display   Initial Impression / Assessment and Plan / ED Course  I have reviewed the triage vital signs and the nursing notes.  Pertinent labs & imaging results that were available during my care of the patient were reviewed by me and considered in my medical decision making (see chart for details).     Patient nontoxic no acute distress. [facility] a missed AB miscarriage patient was treated with Cytotec. Patient is having some abdominal discomfort no severe pain nausea and vomiting. Patient started on the medication on the 10th or 11th. Today's labs without sniffing or mallet is a slight hyponatremia. Patient's vital signs without any acute findings. Recommend follow back up with OB/GYN.  Final Clinical  Impressions(s) / ED Diagnoses   Final diagnoses:  Miscarriage    New Prescriptions New Prescriptions   No medications on file   I personally performed the services described in this documentation, which was scribed in my presence. The recorded information has been reviewed and is accurate.       Vanetta MuldersScott Luchiano Viscomi, MD 01/22/17 2308

## 2017-01-22 NOTE — ED Triage Notes (Addendum)
Pt seen by OB on the 8th for threatened miscarriage. Hcg  Levels had dropped and was given medication due to this. Has been weak and nauseated since. Complaining of some abdominal pain. Has not had a period since. She instructed to be seen in ED

## 2017-01-22 NOTE — Discharge Instructions (Signed)
Symptoms seem to be consistent with the Cytotec that you were given for the miscarriage. Recommend following back up with the OB/GYN office in the morning. Return for any new or worse symptoms. Today's labs without any significant abnormalities other than a slightly low sodium.

## 2017-01-22 NOTE — Telephone Encounter (Signed)
Pt states she is feeling very sick, nauseous all the time, headaches, vomiting and dizziness, has been going on since before taking the Cytotec two weeks ago.  Pt did have bleeding after taking the Cytotec, bleed for about 3 days and passed large clot then bleeding stopped.  Pt states she is trying to drink OJ every few hours to keep her blood sugar up because she has had problems in the past with low blood sugar.  Pt states she is staying hydrated with water and Gatorade.  Please advise.

## 2017-01-22 NOTE — ED Notes (Signed)
Pt reports she had a threatened miscarriage and was given a medication to "break" pregnancy. Spotting and passing blood happened shortly after. Pt continues to have N/ and abd bloating.

## 2017-01-22 NOTE — Telephone Encounter (Signed)
Informed pt to go to ED, pt does not want to go due to cost, she wants to come here tomorrow.  Informed pt our recommendation is the ED but we can not make her go, if she does not she can call back up here tomorrow morning to see if she can get worked in.  Pt verbalized understanding.

## 2017-01-23 ENCOUNTER — Telehealth: Payer: Self-pay | Admitting: *Deleted

## 2017-01-23 ENCOUNTER — Ambulatory Visit: Payer: BLUE CROSS/BLUE SHIELD

## 2017-01-23 ENCOUNTER — Encounter: Payer: Self-pay | Admitting: Advanced Practice Midwife

## 2017-01-23 ENCOUNTER — Ambulatory Visit (INDEPENDENT_AMBULATORY_CARE_PROVIDER_SITE_OTHER): Payer: BLUE CROSS/BLUE SHIELD | Admitting: Advanced Practice Midwife

## 2017-01-23 ENCOUNTER — Other Ambulatory Visit (INDEPENDENT_AMBULATORY_CARE_PROVIDER_SITE_OTHER): Payer: BLUE CROSS/BLUE SHIELD

## 2017-01-23 VITALS — BP 90/60 | HR 88 | Wt 123.4 lb

## 2017-01-23 DIAGNOSIS — O021 Missed abortion: Secondary | ICD-10-CM

## 2017-01-23 MED ORDER — ONDANSETRON 4 MG PO TBDP: 4.0000 mg | ORAL_TABLET | Freq: Three times a day (TID) | ORAL | 1 refills | Status: DC | PRN

## 2017-01-23 NOTE — Progress Notes (Signed)
[facility]  Patient name: IVORY MADURO MRN [id number]  Date of birth: 10/04/1995  CC & HPI:  Carol Bernard is a 22 y.o. Caucasian female presenting today for F/U missed ab.  She had an 8 week GS w/5 week fetal pole, no FCA. Took cytotec 1/13, bled "a little'" and saw a "little blood clot".  Went to ED yesterday on advice from our staff (called at 1650) d/t feeling nausea, some vomiting persistant. Was not treated for nausea or HCG drawn.  Pt states that ED MD told her "[facility] started this, they need to finish it".  Pt rather unhappy.    Pertinent History Reviewed:  Medical & Surgical Hx:   Past Medical History:  Diagnosis Date  . Anxiety and depression   . Birth defect   . Hypoglycemia   . Palpitations    Past Surgical History:  Procedure Laterality Date  . WISDOM TOOTH EXTRACTION     Family History  Problem Relation Age of Onset  . Hypertension Maternal Grandmother   . Skin cancer Maternal Grandmother   . Heart disease Maternal Grandfather     Current Outpatient Prescriptions:  .  promethazine (PHENERGAN) 25 MG tablet, Take 0.5-1 tablets (12.5-25 mg total) by mouth every 6 (six) hours as needed for nausea or vomiting., Disp: 30 tablet, Rfl: 0 .  busPIRone (BUSPAR) 5 MG tablet, Take 5 mg by mouth 2 (two) times daily., Disp: , Rfl:  .  diazepam (VALIUM) 5 MG tablet, Take 5 mg by mouth 2 (two) times daily as needed for anxiety., Disp: , Rfl:  .  metoprolol tartrate (LOPRESSOR) 25 MG tablet, Take 1 tablet (25 mg total) by mouth 2 (two) times daily. (May take an extra 1 tab as needed for palpitations.), Disp: , Rfl:  .  misoprostol (CYTOTEC) 200 MCG tablet, Take 4 tablets (800 mcg total) by mouth once. (Patient not taking: Reported on 01/22/2017), Disp: 4 tablet, Rfl: 1 .  naproxen (NAPROSYN) 250 MG tablet, Take 1-2 tablets (250-500 mg total) by mouth every 12 (twelve) hours as needed for moderate pain. (Patient not taking: Reported on 01/23/2017), Disp: 10 tablet,  Rfl: 0 .  ondansetron (ZOFRAN) 4 MG tablet, Take 1 tablet (4 mg total) by mouth every 6 (six) hours. (Patient not taking: Reported on 01/07/2017), Disp: 12 tablet, Rfl: 0 .  Prenatal Multivit-Min-Fe-FA (PRENATAL VITAMINS PO), Take 1 tablet by mouth daily. , Disp: , Rfl:  .  venlafaxine XR (EFFEXOR-XR) 37.5 MG 24 hr capsule, Take 37.5 mg by mouth daily with breakfast., Disp: , Rfl:  Social History: Reviewed -  reports that she has never smoked. She has never used smokeless tobacco.  Review of Systems:   Constitutional: Negative for fever and chills Eyes: Negative for visual disturbances Respiratory: Negative for shortness of breath, dyspnea Cardiovascular: Negative for chest pain or palpitations  Gastrointestinal: negative for diarrhea and constipation; mild abdominal pain Genitourinary: Negative for dysuria and urgency, vaginal irritation or itching Musculoskeletal: Negative for back pain, joint pain, myalgias  Neurological: Negative for dizziness and headaches    Objective Findings:    Physical Examination: General appearance - well appearing, and in no distress Mental status - alert, oriented to person, place, and time Chest:  Normal respiratory effort Heart - normal rate and regular rhythm Abdomen:  Soft, nontender [place]:  Still 8 week GS, no change since last [place] Musculoskeletal:  Normal range of motion without pain Extremities:  No edema    Results for orders  placed or performed during the hospital encounter of 01/22/17 (from the past 24 hour(s))  Lipase, blood   Collection Time: 01/22/17  6:38 PM  Result Value Ref Range   Lipase 26 11 - 51 U/L  Comprehensive metabolic panel   Collection Time: 01/22/17  6:38 PM  Result Value Ref Range   Sodium 131 (L) 135 - 145 mmol/L   Potassium 3.7 3.5 - 5.1 mmol/L   Chloride 101 101 - 111 mmol/L   CO2 22 22 - 32 mmol/L   Glucose, Bld 91 65 - 99 mg/dL   BUN 7 6 - 20 mg/dL   Creatinine, Ser 1.610.52 0.44 - 1.00 mg/dL   Calcium 9.4 8.9 -  09.610.3 mg/dL   Total Protein 8.1 6.5 - 8.1 g/dL   Albumin 4.6 3.5 - 5.0 g/dL   AST 17 15 - 41 U/L   ALT 12 (L) 14 - 54 U/L   Alkaline Phosphatase 35 (L) 38 - 126 U/L   Total Bilirubin 1.1 0.3 - 1.2 mg/dL   GFR calc non Af Amer >60 >60 mL/min   GFR calc Af Amer >60 >60 mL/min   Anion gap 8 5 - 15  CBC   Collection Time: 01/22/17  6:38 PM  Result Value Ref Range   WBC 10.5 4.0 - 10.5 K/uL   RBC 4.77 3.87 - 5.11 MIL/uL   Hemoglobin 12.9 12.0 - 15.0 g/dL   HCT 04.537.9 40.936.0 - 81.146.0 %   MCV 79.5 78.0 - 100.0 fL   MCH 27.0 26.0 - 34.0 pg   MCHC 34.0 30.0 - 36.0 g/dL   RDW 91.415.0 78.211.5 - 95.615.5 %   Platelets 187 150 - 400 K/uL      Assessment & Plan:  A:   Missed AB, s/p cytotec P:  Options discussed:  Repeat cytotec, wait, D&C Pt will call tomorrow or Friday and let me know what she wants to do   No Follow-up on file.  CRESENZO-DISHMAN,Evalise Abruzzese CNM 01/23/2017 4:21 PM

## 2017-01-23 NOTE — Telephone Encounter (Signed)
Discussed with Leah.  Pt given appointment to f/u with us today.  01-23-17  AS

## 2017-01-23 NOTE — Progress Notes (Signed)
US TV 8+5 wks GS w/ys,crl 2.1 mm 5+3 wks,No FHT seen,normal ov's bilat,N/C [place],Fran reviewed images during the exam and discussed results w/pt.

## 2017-01-24 ENCOUNTER — Telehealth: Payer: Self-pay | Admitting: Advanced Practice Midwife

## 2017-01-24 ENCOUNTER — Other Ambulatory Visit: Payer: Self-pay | Admitting: Advanced Practice Midwife

## 2017-01-24 MED ORDER — PROMETHAZINE HCL 25 MG PO TABS: 12.5000 mg | ORAL_TABLET | Freq: Four times a day (QID) | ORAL | 0 refills | Status: DC | PRN

## 2017-01-24 NOTE — Telephone Encounter (Signed)
Pt called stating that she would like to speak with Drenda FreezeFran regarding her appointment that she had yesterday. Please contact pt

## 2017-01-24 NOTE — Progress Notes (Signed)
Does not want to to another round of cytotec, wants D&C (discussed risks/benefits yesterday).  She does not want [facility] (did not delve into the why).  Discussed w/JVF.  Both his and LHE's schedules [facility] difficult to accommodate. Pt to come in Monday at noon to meed w/JVF for pre-op D&C, [facility].  Pt again offered cytotec, and instructed to call office before 2pm tomorrow if she changes her mind.

## 2017-01-28 ENCOUNTER — Encounter: Payer: Self-pay | Admitting: Obstetrics and Gynecology

## 2017-01-28 ENCOUNTER — Ambulatory Visit (INDEPENDENT_AMBULATORY_CARE_PROVIDER_SITE_OTHER): Payer: BLUE CROSS/BLUE SHIELD | Admitting: Obstetrics and Gynecology

## 2017-01-28 ENCOUNTER — Other Ambulatory Visit: Payer: Self-pay | Admitting: Obstetrics and Gynecology

## 2017-01-28 ENCOUNTER — Encounter: Payer: BLUE CROSS/BLUE SHIELD | Admitting: Obstetrics and Gynecology

## 2017-01-28 VITALS — BP 112/64 | HR 80 | Wt 125.6 lb

## 2017-01-28 DIAGNOSIS — Z118 Encounter for screening for other infectious and parasitic diseases: Secondary | ICD-10-CM

## 2017-01-28 DIAGNOSIS — Z01818 Encounter for other preprocedural examination: Secondary | ICD-10-CM | POA: Diagnosis not present

## 2017-01-28 DIAGNOSIS — O021 Missed abortion: Secondary | ICD-10-CM | POA: Diagnosis not present

## 2017-01-28 DIAGNOSIS — Z1159 Encounter for screening for other viral diseases: Secondary | ICD-10-CM

## 2017-01-28 MED ORDER — ACETAMINOPHEN-CODEINE #3 300-30 MG PO TABS: 1.0000 | ORAL_TABLET | ORAL | 0 refills | Status: DC | PRN

## 2017-01-28 NOTE — Progress Notes (Signed)
Preoperative History and Physical  Carol Bernard is a 22 y.o. G2P0010 here for surgical management of a miscarriage.   No significant preoperative concerns. She was last seen by Carol Bernard on 01/24/17. She reports slight vaginal bleeding.   Proposed surgery: D&C  Past Medical History:  Diagnosis Date  . Anxiety and depression   . Birth defect   . Hypoglycemia   . Palpitations    Past Surgical History:  Procedure Laterality Date  . WISDOM TOOTH EXTRACTION     OB History  Gravida Para Term Preterm AB Living  2       1    SAB TAB Ectopic Multiple Live Births  1            # Outcome Date GA Lbr Len/2nd Weight Sex Delivery Anes PTL Lv  2 Gravida           1 SAB             Patient denies any other pertinent gynecologic issues.   Current Outpatient Prescriptions on File Prior to Visit  Medication Sig Dispense Refill  . promethazine (PHENERGAN) 25 MG tablet Take 0.5-1 tablets (12.5-25 mg total) by mouth every 6 (six) hours as needed for nausea or vomiting. 30 tablet 0  . busPIRone (BUSPAR) 5 MG tablet Take 5 mg by mouth 2 (two) times daily.    . diazepam (VALIUM) 5 MG tablet Take 5 mg by mouth 2 (two) times daily as needed for anxiety.    . metoprolol tartrate (LOPRESSOR) 25 MG tablet Take 1 tablet (25 mg total) by mouth 2 (two) times daily. (May take an extra 1 tab as needed for palpitations.) (Patient not taking: Reported on 01/28/2017)    . misoprostol (CYTOTEC) 200 MCG tablet Take 4 tablets (800 mcg total) by mouth once. (Patient not taking: Reported on 01/22/2017) 4 tablet 1  . naproxen (NAPROSYN) 250 MG tablet Take 1-2 tablets (250-500 mg total) by mouth every 12 (twelve) hours as needed for moderate pain. (Patient not taking: Reported on 01/23/2017) 10 tablet 0  . ondansetron (ZOFRAN) 4 MG tablet Take 1 tablet (4 mg total) by mouth every 6 (six) hours. (Patient not taking: Reported on 01/07/2017) 12 tablet 0  . ondansetron (ZOFRAN-ODT) 4 MG disintegrating tablet Take 1 tablet (4 mg total) by  mouth every 8 (eight) hours as needed for nausea or vomiting. (Patient not taking: Reported on 01/28/2017) 30 tablet 1  . Prenatal Multivit-Min-Fe-FA (PRENATAL VITAMINS PO) Take 1 tablet by mouth daily.     Marland Kitchen venlafaxine XR (EFFEXOR-XR) 37.5 MG 24 hr capsule Take 37.5 mg by mouth daily with breakfast.     No current facility-administered medications on file prior to visit.    Allergies  Allergen Reactions  . Pecan Nut (Diagnostic) Hives  . Reglan [Metoclopramide] Hives    Social History:   reports that she has never smoked. She has never used smokeless tobacco. She reports that she does not drink alcohol or use drugs.  Family History  Problem Relation Age of Onset  . Hypertension Maternal Grandmother   . Skin cancer Maternal Grandmother   . Heart disease Maternal Grandfather     Review of Systems: Noncontributory  PHYSICAL EXAM: Blood pressure 112/64, pulse 80, weight 125 lb 9.6 oz (57 kg), last menstrual period 11/15/2016, unknown if currently breastfeeding. General appearance - alert, well appearing, and in no distress Chest - clear to auscultation, no wheezes, rales or rhonchi, symmetric air entry Heart - normal rate and regular  rhythm Abdomen - soft, nontender, nondistended, no masses or organomegaly  Pelvic - light vaginal bleeding with yellow/brown discharge from cervical os. Uterus is minimally enlarged and anteflexed. Bimanual exam normal Extremities - peripheral pulses normal, no pedal edema, no clubbing or cyanosis  Labs: Results for orders placed or performed during the hospital encounter of 01/22/17 (from the past 336 hour(s))  Lipase, blood   Collection Time: 01/22/17  6:38 PM  Result Value Ref Range   Lipase 26 11 - 51 U/L  Comprehensive metabolic panel   Collection Time: 01/22/17  6:38 PM  Result Value Ref Range   Sodium 131 (L) 135 - 145 mmol/L   Potassium 3.7 3.5 - 5.1 mmol/L   Chloride 101 101 - 111 mmol/L   CO2 22 22 - 32 mmol/L   Glucose, Bld 91 65 - 99  mg/dL   BUN 7 6 - 20 mg/dL   Creatinine, Ser 8.110.52 0.44 - 1.00 mg/dL   Calcium 9.4 8.9 - 91.410.3 mg/dL   Total Protein 8.1 6.5 - 8.1 g/dL   Albumin 4.6 3.5 - 5.0 g/dL   AST 17 15 - 41 U/L   ALT 12 (L) 14 - 54 U/L   Alkaline Phosphatase 35 (L) 38 - 126 U/L   Total Bilirubin 1.1 0.3 - 1.2 mg/dL   GFR calc non Af Amer >60 >60 mL/min   GFR calc Af Amer >60 >60 mL/min   Anion gap 8 5 - 15  CBC   Collection Time: 01/22/17  6:38 PM  Result Value Ref Range   WBC 10.5 4.0 - 10.5 K/uL   RBC 4.77 3.87 - 5.11 MIL/uL   Hemoglobin 12.9 12.0 - 15.0 g/dL   HCT 78.237.9 95.636.0 - 21.346.0 %   MCV 79.5 78.0 - 100.0 fL   MCH 27.0 26.0 - 34.0 pg   MCHC 34.0 30.0 - 36.0 g/dL   RDW 08.615.0 57.811.5 - 46.915.5 %   Platelets 187 150 - 400 K/uL    Imaging Studies: [place] Ob Comp Less 14 Wks  Result Date: 01/16/2017 DATING AND VIABILITY SONOGRAM Carol Bernard is a 22 y.o. year old G2P0010 with LMP 11/05/2016 which would correlate to  7+[redacted] weeks gestation.  She has regular menstrual cycles.   She is here today for a confirmatory initial sonogram. GESTATION: SINGLETON   FETAL ACTIVITY:          Heart rate         NO FHT         CERVIX: Appears closed ADNEXA: The ovaries are normal. GESTATIONAL AGE AND  BIOMETRICS: Gestational criteria: Estimated Date of Delivery: 08/22/2017. by LMP now at 7+4 wks Previous Scans:0 GESTATIONAL SAC           30.8 mm         8+2 weeks CROWN RUMP LENGTH           1.3 mm         5+2 weeks                                                                      AVERAGE EGA(BY THIS SCAN):  5+2 weeks EDD: to be determined  TECHNICIAN COMMENTS: US TA/TV: 8+2 wks GS,w/ys,crl 1.3 mm correlates to 5+2  wks fetal pole,no FHT,normal ov's bilat,pt is seeing Kim following ultrasound, A copy of this report including all images has been saved and backed up to a second source for retrieval if needed. All measures and details of the anatomical scan, placentation, fluid volume and pelvic anatomy are contained in that report. Amber J Carl  01/07/2017 1:28 PM 1. Size<<dates descrepancy, suggestive of missed ab., but would recommend Serial u/s in one week, or serial HCG's. 2. Intrauterine pregnancy confirmed.   US Ob Transvaginal  Result Date: 01/16/2017 DATING AND VIABILITY SONOGRAM MAKHAYLA MCMURRY is a 22 y.o. year old G2P0010 with LMP 11/05/2016 which would correlate to  7+[redacted] weeks gestation.  She has regular menstrual cycles.   She is here today for a confirmatory initial sonogram. GESTATION: SINGLETON   FETAL ACTIVITY:          Heart rate         NO FHT         CERVIX: Appears closed ADNEXA: The ovaries are normal. GESTATIONAL AGE AND  BIOMETRICS: Gestational criteria: Estimated Date of Delivery: 08/22/2017. by LMP now at 7+4 wks Previous Scans:0 GESTATIONAL SAC           30.8 mm         8+2 weeks CROWN RUMP LENGTH           1.3 mm         5+2 weeks                                                                      AVERAGE EGA(BY THIS SCAN):  5+2 weeks EDD: to be determined  TECHNICIAN COMMENTS: [place] TA/TV: 8+2 wks GS,w/ys,crl 1.3 mm correlates to 5+2 wks fetal pole,no FHT,normal ov's bilat,pt is seeing Kim following ultrasound, A copy of this report including all images has been saved and backed up to a second source for retrieval if needed. All measures and details of the anatomical scan, placentation, fluid volume and pelvic anatomy are contained in that report. Amber J Carl 01/07/2017 1:28 PM 1. Size<<dates descrepancy, suggestive of missed ab., but would recommend Serial u/s in one week, or serial HCG's. 2. Intrauterine pregnancy confirmed.    Assessment: Patient Active Problem List   Diagnosis Date Noted  . Threatened miscarriage 01/07/2017    Plan: Patient will undergo surgical management with a D&C. Scheduled for 1/30 Will prescribe Tylenol with codeine to take with ibuprofen for pain control     By signing my name below, I, Sonum Patel, attest that this documentation has been prepared under the direction and in the presence of Tilda Burrow, MD. Electronically Signed: Sonum Patel, Neurosurgeon. 01/28/17. 3:49 PM.  I personally performed the services described in this documentation, which was SCRIBED in my presence. The recorded information has been reviewed and considered accurate. It has been edited as necessary during review. Tilda Burrow, MD

## 2017-01-29 ENCOUNTER — Ambulatory Visit (HOSPITAL_COMMUNITY): Payer: BLUE CROSS/BLUE SHIELD | Admitting: Anesthesiology

## 2017-01-29 ENCOUNTER — Observation Stay (HOSPITAL_COMMUNITY)
Admission: RE | Admit: 2017-01-29 | Discharge: 2017-01-30 | Disposition: A | Discharge status: Home / Self Care | Payer: BLUE CROSS/BLUE SHIELD | Source: Ambulatory Visit | Attending: Obstetrics and Gynecology | Admitting: Obstetrics and Gynecology

## 2017-01-29 ENCOUNTER — Ambulatory Visit (HOSPITAL_COMMUNITY): Payer: BLUE CROSS/BLUE SHIELD

## 2017-01-29 ENCOUNTER — Encounter (HOSPITAL_COMMUNITY): Payer: Self-pay | Admitting: *Deleted

## 2017-01-29 ENCOUNTER — Other Ambulatory Visit: Payer: Self-pay | Admitting: Obstetrics and Gynecology

## 2017-01-29 ENCOUNTER — Encounter (HOSPITAL_COMMUNITY)
Admission: RE | Disposition: A | Discharge status: Home / Self Care | Payer: Self-pay | Source: Ambulatory Visit | Attending: Obstetrics and Gynecology

## 2017-01-29 DIAGNOSIS — F329 Major depressive disorder, single episode, unspecified: Secondary | ICD-10-CM | POA: Insufficient documentation

## 2017-01-29 DIAGNOSIS — O200 Threatened abortion: Secondary | ICD-10-CM | POA: Diagnosis present

## 2017-01-29 DIAGNOSIS — F419 Anxiety disorder, unspecified: Secondary | ICD-10-CM | POA: Diagnosis not present

## 2017-01-29 DIAGNOSIS — Z9889 Other specified postprocedural states: Secondary | ICD-10-CM

## 2017-01-29 DIAGNOSIS — J385 Laryngeal spasm: Secondary | ICD-10-CM | POA: Diagnosis present

## 2017-01-29 DIAGNOSIS — Z79899 Other long term (current) drug therapy: Secondary | ICD-10-CM | POA: Diagnosis not present

## 2017-01-29 DIAGNOSIS — R0600 Dyspnea, unspecified: Secondary | ICD-10-CM

## 2017-01-29 DIAGNOSIS — O021 Missed abortion: Principal | ICD-10-CM | POA: Diagnosis present

## 2017-01-29 HISTORY — DX: Headache: R51

## 2017-01-29 HISTORY — DX: Headache, unspecified: R51.9

## 2017-01-29 HISTORY — DX: Cardiac arrhythmia, unspecified: I49.9

## 2017-01-29 HISTORY — DX: Unspecified asthma, uncomplicated: J45.909

## 2017-01-29 HISTORY — PX: DILATION AND CURETTAGE OF UTERUS: SHX78

## 2017-01-29 HISTORY — DX: Gastro-esophageal reflux disease without esophagitis: K21.9

## 2017-01-29 LAB — CBC
HCT: 32.2 % — ABNORMAL LOW (ref 36.0–46.0)
HEMATOCRIT: 27.8 % — AB (ref 36.0–46.0)
Hemoglobin: 11.3 g/dL — ABNORMAL LOW (ref 12.0–15.0)
Hemoglobin: 9.7 g/dL — ABNORMAL LOW (ref 12.0–15.0)
MCH: 28.1 pg (ref 26.0–34.0)
MCH: 27.8 pg (ref 26.0–34.0)
MCHC: 35.1 g/dL (ref 30.0–36.0)
MCHC: 34.9 g/dL (ref 30.0–36.0)
MCV: 80.1 fL (ref 78.0–100.0)
MCV: 79.7 fL (ref 78.0–100.0)
PLATELETS: 143 10*3/uL — AB (ref 150–400)
PLATELETS: 138 10*3/uL — AB (ref 150–400)
RBC: 4.02 MIL/uL (ref 3.87–5.11)
RBC: 3.49 MIL/uL — ABNORMAL LOW (ref 3.87–5.11)
RDW: 15 % (ref 11.5–15.5)
RDW: 14.8 % (ref 11.5–15.5)
WBC: 6.4 10*3/uL (ref 4.0–10.5)
WBC: 7.7 10*3/uL (ref 4.0–10.5)

## 2017-01-29 LAB — BASIC METABOLIC PANEL
Anion gap: 6 (ref 5–15)
BUN: 5 mg/dL — AB (ref 6–20)
CALCIUM: 8.4 mg/dL — AB (ref 8.9–10.3)
CO2: 21 mmol/L — ABNORMAL LOW (ref 22–32)
CREATININE: 0.54 mg/dL (ref 0.44–1.00)
Chloride: 110 mmol/L (ref 101–111)
GFR calc Af Amer: >60 mL/min (ref >60)
GLUCOSE: 180 mg/dL — AB (ref 65–99)
POTASSIUM: 3.9 mmol/L (ref 3.5–5.1)
SODIUM: 137 mmol/L (ref 135–145)

## 2017-01-29 SURGERY — DILATION AND CURETTAGE
Anesthesia: General | Site: Vagina

## 2017-01-29 MED ORDER — RACEPINEPHRINE HCL 2.25 % IN NEBU
0.5000 mL | INHALATION_SOLUTION | Freq: Once | RESPIRATORY_TRACT | Status: AC
  Administered 2017-01-29: 0.5 mL via RESPIRATORY_TRACT

## 2017-01-29 MED ORDER — BUPIVACAINE-EPINEPHRINE (PF) 0.25% -1:200000 IJ SOLN
INTRAMUSCULAR | Status: AC
  Filled 2017-01-29: qty 30

## 2017-01-29 MED ORDER — MIDAZOLAM HCL 2 MG/2ML IJ SOLN
INTRAMUSCULAR | Status: AC
  Filled 2017-01-29: qty 2

## 2017-01-29 MED ORDER — FENTANYL CITRATE (PF) 100 MCG/2ML IJ SOLN
INTRAMUSCULAR | Status: AC
  Filled 2017-01-29: qty 2

## 2017-01-29 MED ORDER — LIDOCAINE HCL (CARDIAC) 10 MG/ML IV SOLN
INTRAVENOUS | Status: DC | PRN
  Administered 2017-01-29: 50 mg via INTRAVENOUS

## 2017-01-29 MED ORDER — DEXAMETHASONE SODIUM PHOSPHATE 4 MG/ML IJ SOLN
4.0000 mg | Freq: Once | INTRAMUSCULAR | Status: AC
  Administered 2017-01-29: 4 mg via INTRAVENOUS

## 2017-01-29 MED ORDER — 0.9 % SODIUM CHLORIDE (POUR BTL) OPTIME
TOPICAL | Status: DC | PRN
  Administered 2017-01-29: 1000 mL

## 2017-01-29 MED ORDER — IBUPROFEN 600 MG PO TABS: 600.0000 mg | ORAL_TABLET | Freq: Four times a day (QID) | ORAL | Status: DC | PRN

## 2017-01-29 MED ORDER — LORAZEPAM 2 MG/ML IJ SOLN
INTRAMUSCULAR | Status: AC
  Filled 2017-01-29: qty 1

## 2017-01-29 MED ORDER — ONDANSETRON HCL 4 MG/2ML IJ SOLN
4.0000 mg | Freq: Four times a day (QID) | INTRAMUSCULAR | Status: DC | PRN
  Administered 2017-01-30: 4 mg via INTRAVENOUS
  Filled 2017-01-29: qty 2

## 2017-01-29 MED ORDER — ALBUTEROL SULFATE (2.5 MG/3ML) 0.083% IN NEBU
2.5000 mg | INHALATION_SOLUTION | Freq: Once | RESPIRATORY_TRACT | Status: AC
  Administered 2017-01-29: 2.5 mg via RESPIRATORY_TRACT

## 2017-01-29 MED ORDER — ONDANSETRON HCL 4 MG PO TABS: 4.0000 mg | ORAL_TABLET | Freq: Four times a day (QID) | ORAL | Status: DC | PRN

## 2017-01-29 MED ORDER — SODIUM CHLORIDE 0.9 % IN NEBU
INHALATION_SOLUTION | RESPIRATORY_TRACT | Status: AC
  Filled 2017-01-29: qty 3

## 2017-01-29 MED ORDER — BUSPIRONE HCL 5 MG PO TABS
5.0000 mg | ORAL_TABLET | Freq: Two times a day (BID) | ORAL | Status: DC
  Administered 2017-01-30: 5 mg via ORAL
  Filled 2017-01-29: qty 1

## 2017-01-29 MED ORDER — RACEPINEPHRINE HCL 2.25 % IN NEBU
0.5000 mL | INHALATION_SOLUTION | RESPIRATORY_TRACT | Status: DC | PRN
  Administered 2017-01-29: 0.5 mL via RESPIRATORY_TRACT
  Filled 2017-01-29: qty 0.5

## 2017-01-29 MED ORDER — FENTANYL CITRATE (PF) 100 MCG/2ML IJ SOLN
INTRAMUSCULAR | Status: DC | PRN
  Administered 2017-01-29 (×2): 50 ug via INTRAVENOUS

## 2017-01-29 MED ORDER — LORAZEPAM 2 MG/ML IJ SOLN
1.0000 mg | INTRAMUSCULAR | Status: AC | PRN
  Administered 2017-01-29 (×2): 1 mg via INTRAVENOUS

## 2017-01-29 MED ORDER — ALBUTEROL SULFATE (2.5 MG/3ML) 0.083% IN NEBU: 2.5000 mg | INHALATION_SOLUTION | RESPIRATORY_TRACT | Status: DC | PRN

## 2017-01-29 MED ORDER — CEFAZOLIN SODIUM-DEXTROSE 2-4 GM/100ML-% IV SOLN
INTRAVENOUS | Status: AC
  Filled 2017-01-29: qty 100

## 2017-01-29 MED ORDER — RACEPINEPHRINE HCL 2.25 % IN NEBU
INHALATION_SOLUTION | RESPIRATORY_TRACT | Status: AC
Start: 2017-01-29 — End: 2017-01-29
  Filled 2017-01-29: qty 0.5

## 2017-01-29 MED ORDER — SCOPOLAMINE 1 MG/3DAYS TD PT72
MEDICATED_PATCH | TRANSDERMAL | Status: AC
  Filled 2017-01-29: qty 1

## 2017-01-29 MED ORDER — RACEPINEPHRINE HCL 2.25 % IN NEBU
INHALATION_SOLUTION | RESPIRATORY_TRACT | Status: AC
  Filled 2017-01-29: qty 0.5

## 2017-01-29 MED ORDER — DEXAMETHASONE SODIUM PHOSPHATE 4 MG/ML IJ SOLN
INTRAMUSCULAR | Status: AC
  Filled 2017-01-29: qty 1

## 2017-01-29 MED ORDER — HYDROMORPHONE HCL 1 MG/ML IJ SOLN
INTRAMUSCULAR | Status: AC
  Filled 2017-01-29: qty 0.5

## 2017-01-29 MED ORDER — PROMETHAZINE HCL 12.5 MG PO TABS
25.0000 mg | ORAL_TABLET | Freq: Four times a day (QID) | ORAL | Status: DC | PRN
  Administered 2017-01-29: 25 mg via ORAL
  Filled 2017-01-29: qty 2

## 2017-01-29 MED ORDER — HYDROMORPHONE HCL 1 MG/ML IJ SOLN
0.2500 mg | INTRAMUSCULAR | Status: DC | PRN
  Administered 2017-01-29 (×3): 0.5 mg via INTRAVENOUS
  Filled 2017-01-29: qty 0.5

## 2017-01-29 MED ORDER — METHYLPREDNISOLONE SODIUM SUCC 40 MG IJ SOLR
40.0000 mg | Freq: Two times a day (BID) | INTRAMUSCULAR | Status: DC
  Administered 2017-01-30 (×2): 40 mg via INTRAVENOUS
  Filled 2017-01-29 (×2): qty 1

## 2017-01-29 MED ORDER — PANTOPRAZOLE SODIUM 40 MG PO TBEC
40.0000 mg | DELAYED_RELEASE_TABLET | Freq: Every day | ORAL | Status: DC
  Administered 2017-01-30: 40 mg via ORAL
  Filled 2017-01-29: qty 1

## 2017-01-29 MED ORDER — ALBUTEROL SULFATE (2.5 MG/3ML) 0.083% IN NEBU
INHALATION_SOLUTION | RESPIRATORY_TRACT | Status: AC
  Filled 2017-01-29: qty 3

## 2017-01-29 MED ORDER — SUCCINYLCHOLINE CHLORIDE 20 MG/ML IJ SOLN
INTRAMUSCULAR | Status: DC | PRN
  Administered 2017-01-29: 120 mg via INTRAVENOUS

## 2017-01-29 MED ORDER — SODIUM CHLORIDE 0.9 % IV SOLN
INTRAVENOUS | Status: DC
  Administered 2017-01-29 – 2017-01-30 (×3): via INTRAVENOUS

## 2017-01-29 MED ORDER — SUCCINYLCHOLINE CHLORIDE 20 MG/ML IJ SOLN
INTRAMUSCULAR | Status: AC
  Filled 2017-01-29: qty 1

## 2017-01-29 MED ORDER — OXYTOCIN 10 UNIT/ML IJ SOLN
INTRAMUSCULAR | Status: DC | PRN
  Administered 2017-01-29 (×2): 10 [IU] via INTRAMUSCULAR

## 2017-01-29 MED ORDER — LIDOCAINE HCL (PF) 1 % IJ SOLN
INTRAMUSCULAR | Status: AC
  Filled 2017-01-29: qty 5

## 2017-01-29 MED ORDER — METHYLPREDNISOLONE SODIUM SUCC 125 MG IJ SOLR
INTRAMUSCULAR | Status: AC
Start: 2017-01-29 — End: 2017-01-29
  Filled 2017-01-29: qty 2

## 2017-01-29 MED ORDER — ACETAMINOPHEN-CODEINE #3 300-30 MG PO TABS: 1.0000 | ORAL_TABLET | ORAL | 0 refills | Status: DC | PRN

## 2017-01-29 MED ORDER — MIDAZOLAM HCL 2 MG/2ML IJ SOLN
2.0000 mg | Freq: Once | INTRAMUSCULAR | Status: AC
  Administered 2017-01-29: 2 mg via INTRAVENOUS

## 2017-01-29 MED ORDER — PRENATAL MULTIVITAMIN CH
1.0000 | ORAL_TABLET | Freq: Every day | ORAL | Status: DC
  Administered 2017-01-30: 1 via ORAL
  Filled 2017-01-29 (×3): qty 1

## 2017-01-29 MED ORDER — ROCURONIUM BROMIDE 100 MG/10ML IV SOLN
INTRAVENOUS | Status: DC | PRN
  Administered 2017-01-29: 5 mg via INTRAVENOUS

## 2017-01-29 MED ORDER — OXYCODONE-ACETAMINOPHEN 5-325 MG PO TABS
1.0000 | ORAL_TABLET | ORAL | Status: DC | PRN
  Administered 2017-01-29: 2 via ORAL
  Administered 2017-01-30: 1 via ORAL
  Administered 2017-01-30: 2 via ORAL
  Filled 2017-01-29 (×2): qty 2
  Filled 2017-01-29: qty 1

## 2017-01-29 MED ORDER — ONDANSETRON HCL 4 MG/2ML IJ SOLN
4.0000 mg | Freq: Once | INTRAMUSCULAR | Status: AC
  Administered 2017-01-29: 4 mg via INTRAVENOUS

## 2017-01-29 MED ORDER — MIDAZOLAM HCL 2 MG/2ML IJ SOLN
0.5000 mg | INTRAMUSCULAR | Status: DC | PRN
  Administered 2017-01-29: 1 mg via INTRAVENOUS
  Administered 2017-01-29: 2 mg via INTRAVENOUS
  Administered 2017-01-29: 1 mg via INTRAVENOUS
  Administered 2017-01-29: 2 mg via INTRAVENOUS
  Filled 2017-01-29 (×2): qty 2

## 2017-01-29 MED ORDER — OXYTOCIN 10 UNIT/ML IJ SOLN
INTRAMUSCULAR | Status: AC
  Filled 2017-01-29: qty 2

## 2017-01-29 MED ORDER — PROPOFOL 10 MG/ML IV BOLUS
INTRAVENOUS | Status: DC | PRN
  Administered 2017-01-29: 150 mg via INTRAVENOUS

## 2017-01-29 MED ORDER — METHYLPREDNISOLONE SODIUM SUCC 125 MG IJ SOLR
125.0000 mg | Freq: Once | INTRAMUSCULAR | Status: AC
  Administered 2017-01-29: 125 mg via INTRAVENOUS

## 2017-01-29 MED ORDER — KETOROLAC TROMETHAMINE 30 MG/ML IJ SOLN
30.0000 mg | Freq: Four times a day (QID) | INTRAMUSCULAR | Status: DC
  Administered 2017-01-29: 30 mg via INTRAVENOUS
  Filled 2017-01-29: qty 1

## 2017-01-29 MED ORDER — VENLAFAXINE HCL ER 37.5 MG PO CP24
37.5000 mg | ORAL_CAPSULE | Freq: Every day | ORAL | Status: DC
  Administered 2017-01-30: 37.5 mg via ORAL
  Filled 2017-01-29 (×3): qty 1

## 2017-01-29 MED ORDER — SCOPOLAMINE 1 MG/3DAYS TD PT72
1.0000 | MEDICATED_PATCH | Freq: Once | TRANSDERMAL | Status: DC
  Administered 2017-01-29: 1.5 mg via TRANSDERMAL

## 2017-01-29 MED ORDER — LACTATED RINGERS IV SOLN
INTRAVENOUS | Status: DC
  Administered 2017-01-29: 11:00:00 via INTRAVENOUS

## 2017-01-29 MED ORDER — BUPIVACAINE-EPINEPHRINE (PF) 0.25% -1:200000 IJ SOLN
INTRAMUSCULAR | Status: DC | PRN
  Administered 2017-01-29: 10 mL via PERINEURAL

## 2017-01-29 MED ORDER — CEFAZOLIN SODIUM-DEXTROSE 2-4 GM/100ML-% IV SOLN
2.0000 g | INTRAVENOUS | Status: AC
  Administered 2017-01-29: 2 g via INTRAVENOUS
  Filled 2017-01-29: qty 100

## 2017-01-29 MED ORDER — METHYLERGONOVINE MALEATE 0.2 MG/ML IJ SOLN
INTRAMUSCULAR | Status: AC
  Filled 2017-01-29: qty 1

## 2017-01-29 MED ORDER — ONDANSETRON HCL 4 MG/2ML IJ SOLN
INTRAMUSCULAR | Status: AC
  Filled 2017-01-29: qty 2

## 2017-01-29 MED ORDER — DIAZEPAM 5 MG PO TABS
5.0000 mg | ORAL_TABLET | Freq: Two times a day (BID) | ORAL | Status: DC
  Administered 2017-01-29 – 2017-01-30 (×2): 5 mg via ORAL
  Filled 2017-01-29 (×2): qty 1

## 2017-01-29 SURGICAL SUPPLY — 34 items
BAG HAMPER (MISCELLANEOUS) IMPLANT
CATH ROBINSON RED A/P 14FR (CATHETERS) IMPLANT
CLOTH BEACON ORANGE TIMEOUT ST (SAFETY) ×3 IMPLANT
COVER LIGHT HANDLE STERIS (MISCELLANEOUS) ×6 IMPLANT
COVER MAYO STAND XLG (DRAPE) ×3 IMPLANT
CURETTE VACUUM 9MM CVD CLR (CANNULA) ×3 IMPLANT
DECANTER SPIKE VIAL GLASS SM (MISCELLANEOUS) IMPLANT
FORMALIN 10 PREFIL 480ML (MISCELLANEOUS) ×3 IMPLANT
GAUZE SPONGE 4X4 16PLY XRAY LF (GAUZE/BANDAGES/DRESSINGS) ×3 IMPLANT
GLOVE BIOGEL PI IND STRL 7.0 (GLOVE) ×1 IMPLANT
GLOVE BIOGEL PI IND STRL 7.5 (GLOVE) ×1 IMPLANT
GLOVE BIOGEL PI IND STRL 9 (GLOVE) ×1 IMPLANT
GLOVE BIOGEL PI INDICATOR 7.0 (GLOVE) ×2
GLOVE BIOGEL PI INDICATOR 7.5 (GLOVE) ×2
GLOVE BIOGEL PI INDICATOR 9 (GLOVE) ×2
GLOVE ECLIPSE 6.5 STRL STRAW (GLOVE) ×3 IMPLANT
GLOVE ECLIPSE 7.0 STRL STRAW (GLOVE) ×3 IMPLANT
GLOVE ECLIPSE 9.0 STRL (GLOVE) ×3 IMPLANT
GOWN SPEC L3 XXLG W/TWL (GOWN DISPOSABLE) ×3 IMPLANT
GOWN STRL REUS W/TWL LRG LVL3 (GOWN DISPOSABLE) ×3 IMPLANT
KIT BERKELEY 1ST TRIMESTER 3/8 (MISCELLANEOUS) ×3 IMPLANT
KIT ROOM TURNOVER AP CYSTO (KITS) ×3 IMPLANT
MARKER SKIN DUAL TIP RULER LAB (MISCELLANEOUS) ×3 IMPLANT
NEEDLE SPNL 22GX3.5 QUINCKE BK (NEEDLE) ×3 IMPLANT
NS IRRIG 1000ML POUR BTL (IV SOLUTION) ×3 IMPLANT
PACK BASIC III (CUSTOM PROCEDURE TRAY) ×2
PACK SRG BSC III STRL LF ECLPS (CUSTOM PROCEDURE TRAY) ×1 IMPLANT
PAD ARMBOARD 7.5X6 YLW CONV (MISCELLANEOUS) ×3 IMPLANT
SET BERKELEY SUCTION TUBING (SUCTIONS) ×3 IMPLANT
SYR CONTROL 10ML LL (SYRINGE) IMPLANT
TOWEL OR 17X26 4PK STRL BLUE (TOWEL DISPOSABLE) ×3 IMPLANT
VACURETTE 10MM (CANNULA) ×3 IMPLANT
VACURETTE 12MM (CANNULA) IMPLANT
VACURETTE 8MM (CANNULA) IMPLANT

## 2017-01-29 NOTE — Discharge Instructions (Signed)

## 2017-01-29 NOTE — Brief Op Note (Signed)
01/29/2017  12:44 PM  PATIENT:  Carol Bernard  22 y.o. female  PRE-OPERATIVE DIAGNOSIS:  missed abortion  POST-OPERATIVE DIAGNOSIS:  missed abortion  PROCEDURE:  Procedure(s) with comments: SUCTION DILATATION AND CURETTAGE (N/A) - patient to arrive at 10:00 for any needed labwork  SURGEON:  Surgeon(s) and Role:    * Tilda BurrowJohn Timithy Arons V, MD - Primary   PHYSICIAN ASSISTANT:   ASSISTANTS: Angela witt CST   ANESTHESIA:   paracervical block  EBL:  Total I/O In: 1000 [I.V.:1000] Out: 400 [Blood:400] Increased blood loss due to a tendency toward uterine atony, eventually responding to IV oxytocin. The patient will be given Cytotec orally in recovery BLOOD ADMINISTERED:none  DRAINS: none   LOCAL MEDICATIONS USED:  MARCAINE    and Amount: 20 ml  SPECIMEN:  Source of Specimen:  Products of conception  DISPOSITION OF SPECIMEN:  PATHOLOGY  COUNTS:  YES  TOURNIQUET:  * No tourniquets in log *  DICTATION: .Dragon Dictation  PLAN OF CARE: Discharge to home after PACU  PATIENT DISPOSITION:  PACU - hemodynamically stable.   Delay start of Pharmacological VTE agent (>24hrs) due to surgical blood loss or risk of bleeding: n/a Details of procedure: Patient was taken operating room prepped and draped for vaginal procedure with anesthesia introduced and timeout conducted. Ancef was administered. Cervix was grasped with single-tooth tenaculum sounded in the anteflexed to mid position to a depth of 12 cm, dilated to 27 [place] which allowed introduction of a 9 mm suction curette. It was noted during the dilation patient that are a real tendency toward increase bleeding, [name] 100 cc during the initial port of the case the patient will then used a 9 mm curved suction curet was placed inside the uterine cavity and suction applied to 45-50 mm suction pressure appropriate amounts of tissue began to be extracted. The tissue would not completely come out of the cervix so we switched to a 10 mm suction  curette which was more effective. Again suction curettage removed tissue sometimes and large portions consistent with normal procedure. IV oxytocin was infused smooth sharp curettage was performed and there was a small amount of tissue on the anterior right side that was encountered that was able to be curetted away and then again suction curettage repeated 2 and confirmatory curettage again performed and the uterus remained somewhat boggy but there was never any suspicion of uterine perforation and at the end of the procedure with felt like that the cesarean internal uterine cavity felt smooth EBL for the entire procedure was at least 400 cc. The patient was observed in the bleeding seemed controlled so procedure was considered satisfactorily completed. Patient will be given Cytotec orally in recovery room and a prescription for 3 times a day Cytotec for 24 hours given. Sponge and needle counts correct blood type is Confirmed as A positive

## 2017-01-29 NOTE — H&P (Signed)
Preoperative History and Physical  Carol Bernard is a 22 y.o. G2P0010 here for surgical management of a miscarriage.   No significant preoperative concerns. She was last seen by Carol Bernard on 01/24/17. She reports slight vaginal bleeding.   Proposed surgery: D&C      Past Medical History:  Diagnosis Date  . Anxiety and depression   . Birth defect   . Hypoglycemia   . Palpitations         Past Surgical History:  Procedure Laterality Date  . WISDOM TOOTH EXTRACTION                     OB History  Gravida Para Term Preterm AB Living  2       1    SAB TAB Ectopic Multiple Live Births  1            # Outcome Date GA Lbr Len/2nd Weight Sex Delivery Anes PTL Lv  2 Gravida           1 SAB             Patient denies any other pertinent gynecologic issues.         Current Outpatient Prescriptions on File Prior to Visit  Medication Sig Dispense Refill  . promethazine (PHENERGAN) 25 MG tablet Take 0.5-1 tablets (12.5-25 mg total) by mouth every 6 (six) hours as needed for nausea or vomiting. 30 tablet 0  . busPIRone (BUSPAR) 5 MG tablet Take 5 mg by mouth 2 (two) times daily.    . diazepam (VALIUM) 5 MG tablet Take 5 mg by mouth 2 (two) times daily as needed for anxiety.    . metoprolol tartrate (LOPRESSOR) 25 MG tablet Take 1 tablet (25 mg total) by mouth 2 (two) times daily. (May take an extra 1 tab as needed for palpitations.) (Patient not taking: Reported on 01/28/2017)    . misoprostol (CYTOTEC) 200 MCG tablet Take 4 tablets (800 mcg total) by mouth once. (Patient not taking: Reported on 01/22/2017) 4 tablet 1  . naproxen (NAPROSYN) 250 MG tablet Take 1-2 tablets (250-500 mg total) by mouth every 12 (twelve) hours as needed for moderate pain. (Patient not taking: Reported on 01/23/2017) 10 tablet 0  . ondansetron (ZOFRAN) 4 MG tablet Take 1 tablet (4 mg total) by mouth every 6 (six) hours. (Patient not taking: Reported on 01/07/2017) 12 tablet 0  .  ondansetron (ZOFRAN-ODT) 4 MG disintegrating tablet Take 1 tablet (4 mg total) by mouth every 8 (eight) hours as needed for nausea or vomiting. (Patient not taking: Reported on 01/28/2017) 30 tablet 1  . Prenatal Multivit-Min-Fe-FA (PRENATAL VITAMINS PO) Take 1 tablet by mouth daily.     Marland Kitchen venlafaxine XR (EFFEXOR-XR) 37.5 MG 24 hr capsule Take 37.5 mg by mouth daily with breakfast.     No current facility-administered medications on file prior to visit.        Allergies  Allergen Reactions  . Pecan Nut (Diagnostic) Hives  . Reglan [Metoclopramide] Hives    Social History:   reports that she has never smoked. She has never used smokeless tobacco. She reports that she does not drink alcohol or use drugs.       Family History  Problem Relation Age of Onset  . Hypertension Maternal Grandmother   . Skin cancer Maternal Grandmother   . Heart disease Maternal Grandfather     Review of Systems: Noncontributory  PHYSICAL EXAM: Blood pressure 112/64, pulse 80, weight 125 lb 9.6  oz (57 kg), last menstrual period 11/15/2016, unknown if currently breastfeeding. General appearance - alert, well appearing, and in no distress Chest - clear to auscultation, no wheezes, rales or rhonchi, symmetric air entry Heart - normal rate and regular rhythm Abdomen - soft, nontender, nondistended, no masses or organomegaly  Pelvic - light vaginal bleeding with yellow/brown discharge from cervical os. Uterus is minimally enlarged and anteflexed. Bimanual exam normal Extremities - peripheral pulses normal, no pedal edema, no clubbing or cyanosis  Labs:      Results for orders placed or performed during the hospital encounter of 01/22/17 (from the past 336 hour(s))  Lipase, blood   Collection Time: 01/22/17  6:38 PM  Result Value Ref Range   Lipase 26 11 - 51 U/L  Comprehensive metabolic panel   Collection Time: 01/22/17  6:38 PM  Result Value Ref Range   Sodium 131 (L) 135 - 145 mmol/L    Potassium 3.7 3.5 - 5.1 mmol/L   Chloride 101 101 - 111 mmol/L   CO2 22 22 - 32 mmol/L   Glucose, Bld 91 65 - 99 mg/dL   BUN 7 6 - 20 mg/dL   Creatinine, Ser 1.610.52 0.44 - 1.00 mg/dL   Calcium 9.4 8.9 - 09.610.3 mg/dL   Total Protein 8.1 6.5 - 8.1 g/dL   Albumin 4.6 3.5 - 5.0 g/dL   AST 17 15 - 41 U/L   ALT 12 (L) 14 - 54 U/L   Alkaline Phosphatase 35 (L) 38 - 126 U/L   Total Bilirubin 1.1 0.3 - 1.2 mg/dL   GFR calc non Af Amer >60 >60 mL/min   GFR calc Af Amer >60 >60 mL/min   Anion gap 8 5 - 15  CBC   Collection Time: 01/22/17  6:38 PM  Result Value Ref Range   WBC 10.5 4.0 - 10.5 K/uL   RBC 4.77 3.87 - 5.11 MIL/uL   Hemoglobin 12.9 12.0 - 15.0 g/dL   HCT 04.537.9 40.936.0 - 81.146.0 %   MCV 79.5 78.0 - 100.0 fL   MCH 27.0 26.0 - 34.0 pg   MCHC 34.0 30.0 - 36.0 g/dL   RDW 91.415.0 78.211.5 - 95.615.5 %   Platelets 187 150 - 400 K/uL    Imaging Studies:  ImagingResults  [place] Ob Comp Less 14 Wks  Result Date: 01/16/2017 DATING AND VIABILITY SONOGRAM Carol Bernard is a 22 y.o. year old G2P0010 with LMP 11/05/2016 which would correlate to  7+[redacted] weeks gestation.  She has regular menstrual cycles.   She is here today for a confirmatory initial sonogram. GESTATION: SINGLETON   FETAL ACTIVITY:          Heart rate         NO FHT         CERVIX: Appears closed ADNEXA: The ovaries are normal. GESTATIONAL AGE AND  BIOMETRICS: Gestational criteria: Estimated Date of Delivery: 08/22/2017. by LMP now at 7+4 wks Previous Scans:0 GESTATIONAL SAC           30.8 mm         8+2 weeks CROWN RUMP LENGTH           1.3 mm         5+2 weeks  AVERAGE EGA(BY THIS SCAN):  5+2 weeks EDD: to be determined  TECHNICIAN COMMENTS: [place] TA/TV: 8+2 wks GS,w/ys,crl 1.3 mm correlates to 5+2 wks fetal pole,no FHT,normal ov's bilat,pt is seeing Carol Bernard following ultrasound, A copy of this report including all images has been saved and backed up to a second  source for retrieval if needed. All measures and details of the anatomical scan, placentation, fluid volume and pelvic anatomy are contained in that report. Carol Bernard 01/07/2017 1:28 PM 1. Size<<dates descrepancy, suggestive of missed ab., but would recommend Serial u/s in one week, or serial HCG's. 2. Intrauterine pregnancy confirmed.   US Ob Transvaginal  Result Date: 01/16/2017 DATING AND VIABILITY SONOGRAM ANUPAMA PIEHL is a 22 y.o. year old G2P0010 with LMP 11/05/2016 which would correlate to  7+[redacted] weeks gestation.  She has regular menstrual cycles.   She is here today for a confirmatory initial sonogram. GESTATION: SINGLETON   FETAL ACTIVITY:          Heart rate         NO FHT         CERVIX: Appears closed ADNEXA: The ovaries are normal. GESTATIONAL AGE AND  BIOMETRICS: Gestational criteria: Estimated Date of Delivery: 08/22/2017. by LMP now at 7+4 wks Previous Scans:0 GESTATIONAL SAC           30.8 mm         8+2 weeks CROWN RUMP LENGTH           1.3 mm         5+2 weeks                                                                      AVERAGE EGA(BY THIS SCAN):  5+2 weeks EDD: to be determined  TECHNICIAN COMMENTS: [place] TA/TV: 8+2 wks GS,w/ys,crl 1.3 mm correlates to 5+2 wks fetal pole,no FHT,normal ov's bilat,pt is seeing Carol Bernard following ultrasound, A copy of this report including all images has been saved and backed up to a second source for retrieval if needed. All measures and details of the anatomical scan, placentation, fluid volume and pelvic anatomy are contained in that report. Carol Bernard 01/07/2017 1:28 PM 1. Size<<dates descrepancy, suggestive of missed ab., but would recommend Serial u/s in one week, or serial HCG's. 2. Intrauterine pregnancy confirmed.     Assessment:     Patient Active Problem List   Diagnosis Date Noted  . Threatened miscarriage 01/07/2017    Plan: Patient will undergo surgical management with a D&C ,Suction Dilation and Curettage. Scheduled for 1/30 Will  prescribe Tylenol with codeine to take with ibuprofen for pain control     By signing my name below, I, Sonum Patel, attest that this documentation has been prepared under the direction and in the presence of Tilda Burrow, MD. Electronically Signed: Sonum Patel, Neurosurgeon. 01/28/17. 3:49 PM.  I personally performed the services described in this documentation, which was SCRIBED in my presence. The recorded information has been reviewed and considered accurate. It has been edited as necessary during review. Tilda Burrow, MD

## 2017-01-29 NOTE — Anesthesia Postprocedure Evaluation (Addendum)
Anesthesia Post Note  Patient: [name]  Procedure(s) Performed: Procedure(s) (LRB): SUCTION DILATATION AND CURETTAGE (N/A)  Patient location during evaluation: PACU Anesthesia Type: General Level of consciousness: awake and alert Pain management: pain level controlled Vital Signs Assessment: post-procedure vital signs reviewed and stable Respiratory status: spontaneous breathing, patient connected to face mask oxygen and respiratory function stable Cardiovascular status: stable Postop Assessment: no signs of nausea or vomiting Anesthetic complications: no Comments: Pt c/o SOB in PACU.  O2 sat =100%, given albuterol nebs treatment, but she claims no improvement. CXR negative. Pt making stridor sounds that stop when she speaks to answer questions. Lungs are clear to auscultation. Lack of organic cause for her SOB leads to think that it is more likely an anxiety reaction. Will consult with her surgeon and plan for extended observation and monitoring.                    Raena Pau

## 2017-01-29 NOTE — Op Note (Signed)
Please see the brief operative note for details. The patient had a greater than normal blood loss 400 cc due to a tendency toward a boggy uterus that only slightly firmed up after curettage

## 2017-01-29 NOTE — Progress Notes (Signed)
Patient came into PACU  Having difficult time catching breath. Oral suction was used on patient and an albuterol breathing treatment given. 5 mg of versed was given at interval times. Scopalamine patch was removed and pitocin was stopped. A chest x-ray was taken because patient complained of chest pain. A 12 lead EKG was done which showed sinus tachycardia. No further orders at this time from Dr. Marcos EkeGonzales.

## 2017-01-29 NOTE — Progress Notes (Signed)
Day of Surgery Procedure(s) (LRB): SUCTION DILATATION AND CURETTAGE (N/A) Rolly SalterHaley was admitted after a dramatic presentation , somewhat histrionic, of respiratory stridor postop after Suction D&C.  Workup by Anesthesia and then myself included albuterol, then EKG (nl), CXR (nl), and then sedation, restarting pt's reported preop anxiolytics.  Subjective: Patient reports currently she's somewhat sedated, is breathing without stridor, talking slightly hoarse voice. .  Pt is somewhat somnolent, but she asked boyfriend to leave room so she could tell me she wanted to stay another night after tonight as she knew no one could take care of her at her home, where she lives with her grandmother who is limited in mobility. Pt's mother and stepfather live in area. Pt is adopted, I think. Pt has made multiple somewhat illogical assessments throughout her care, refusing po cytotec second dose, [facility],    Objective: I have reviewed patient's vital signs, intake and output and labs. She had an episode of anxiety with HR to 140's.  BP 122/65 (BP Location: Right Arm)   Pulse (!) 127   Temp 97.9 F (36.6 C) (Oral)   Resp (!) 25   SpO2 100%  Breathing is fine now. General: fatigued and anxious. GI: soft, non-tender; bowel sounds normal; no masses,  no organomegaly Vaginal Bleeding: minimal  Assessment: s/p Procedure(s): SUCTION DILATATION AND CURETTAGE (N/A): stable and anxiety with somatization. Dramatic initial response to intubation. Plan: 1. To continue solumedrol  2  . Social work consult.consider Psychology followupas outpatient. 3 check CBC and cmet tonight instead of a.m. 4 No indicators for continued inpatient care after tonight, but pt does need psychologic support for her issues as outpt.  LOS: 0 days    Jeovanni Heuring V 01/29/2017, 8:13 PM

## 2017-01-29 NOTE — Transfer of Care (Signed)
Immediate Anesthesia Transfer of Care Note  Patient: Carol Bernard  Procedure(s) Performed: Procedure(s): SUCTION DILATATION AND CURETTAGE (N/A)  Patient Location: PACU  Anesthesia Type:General  Level of Consciousness: awake and patient cooperative  Airway & Oxygen Therapy: Patient Spontanous Breathing and Patient connected to face mask oxygen  Post-op Assessment: Report given to RN, Post -op Vital signs reviewed and stable and Patient moving all extremities  Post vital signs: Reviewed and stable  Last Vitals:  Vitals:   01/29/17 1105 01/29/17 1110  BP: 115/73 119/76  Resp: (!) 22 (!) 30    Last Pain: There were no vitals filed for this visit.       Complications: No apparent anesthesia complications

## 2017-01-29 NOTE — Anesthesia Postprocedure Evaluation (Addendum)
Anesthesia Post Note  Patient: Carol Bernard  Procedure(s) Performed: Procedure(s) (LRB): SUCTION DILATATION AND CURETTAGE (N/A)  Patient location during evaluation: PACU Anesthesia Type: General Level of consciousness: oriented (Patient resting quietly, dozing off an.) Pain management: pain level controlled Vital Signs Assessment: post-procedure vital signs reviewed and stable Respiratory status: spontaneous breathing (Room air, no stridor, lungs clear.) Cardiovascular status: stable and blood pressure returned to baseline Postop Assessment: no signs of nausea or vomiting Anesthesia complication: Airway issues resolved.  Being admitted for observation, may be discharged later if stable. Comments: sats 98-100.      Last Vitals:  Vitals:   01/29/17 1515 01/29/17 1530  BP: 117/69 127/72  Pulse: (!) 106 (!) 127  Resp: 13 13  Temp:      Last Pain:  Vitals:   01/29/17 1510  PainSc: 6                  Kynzee Devinney

## 2017-01-29 NOTE — Anesthesia Procedure Notes (Signed)
Procedure Name: Intubation Date/Time: 01/29/2017 12:03 PM Performed by: Franco NonesYATES, Less Woolsey S Pre-anesthesia Checklist: Patient identified, Patient being monitored, Timeout performed, Emergency Drugs available and Suction available Patient Re-evaluated:Patient Re-evaluated prior to inductionOxygen Delivery Method: Circle System Utilized Preoxygenation: Pre-oxygenation with 100% oxygen Intubation Type: IV induction Ventilation: Mask ventilation without difficulty Laryngoscope Size: Miller and 2 Grade View: Grade I Tube type: Oral Tube size: 7.0 mm Number of attempts: 1 Airway Equipment and Method: Stylet Placement Confirmation: ETT inserted through vocal cords under direct vision,  positive ETCO2 and breath sounds checked- equal and bilateral Secured at: 21 cm Tube secured with: Tape Dental Injury: Teeth and Oropharynx as per pre-operative assessment

## 2017-01-29 NOTE — Progress Notes (Signed)
Preoperative History and Physical  Carol Bernard is a 22 y.o. G2P0010 here for surgical management of a miscarriage.   No significant preoperative concerns. She was last seen by Drenda FreezeFran on 01/24/17. She reports slight vaginal bleeding.   Proposed surgery: D&C      Past Medical History:  Diagnosis Date  . Anxiety and depression   . Birth defect   . Hypoglycemia   . Palpitations         Past Surgical History:  Procedure Laterality Date  . WISDOM TOOTH EXTRACTION                     OB History  Gravida Para Term Preterm AB Living  2       1    SAB TAB Ectopic Multiple Live Births  1            # Outcome Date GA Lbr Len/2nd Weight Sex Delivery Anes PTL Lv  2 Gravida           1 SAB             Patient denies any other pertinent gynecologic issues.         Current Outpatient Prescriptions on File Prior to Visit  Medication Sig Dispense Refill  . promethazine (PHENERGAN) 25 MG tablet Take 0.5-1 tablets (12.5-25 mg total) by mouth every 6 (six) hours as needed for nausea or vomiting. 30 tablet 0  . busPIRone (BUSPAR) 5 MG tablet Take 5 mg by mouth 2 (two) times daily.    . diazepam (VALIUM) 5 MG tablet Take 5 mg by mouth 2 (two) times daily as needed for anxiety.    . metoprolol tartrate (LOPRESSOR) 25 MG tablet Take 1 tablet (25 mg total) by mouth 2 (two) times daily. (May take an extra 1 tab as needed for palpitations.) (Patient not taking: Reported on 01/28/2017)    . misoprostol (CYTOTEC) 200 MCG tablet Take 4 tablets (800 mcg total) by mouth once. (Patient not taking: Reported on 01/22/2017) 4 tablet 1  . naproxen (NAPROSYN) 250 MG tablet Take 1-2 tablets (250-500 mg total) by mouth every 12 (twelve) hours as needed for moderate pain. (Patient not taking: Reported on 01/23/2017) 10 tablet 0  . ondansetron (ZOFRAN) 4 MG tablet Take 1 tablet (4 mg total) by mouth every 6 (six) hours. (Patient not taking: Reported on 01/07/2017) 12 tablet 0  .  ondansetron (ZOFRAN-ODT) 4 MG disintegrating tablet Take 1 tablet (4 mg total) by mouth every 8 (eight) hours as needed for nausea or vomiting. (Patient not taking: Reported on 01/28/2017) 30 tablet 1  . Prenatal Multivit-Min-Fe-FA (PRENATAL VITAMINS PO) Take 1 tablet by mouth daily.     Marland Kitchen. venlafaxine XR (EFFEXOR-XR) 37.5 MG 24 hr capsule Take 37.5 mg by mouth daily with breakfast.     No current facility-administered medications on file prior to visit.        Allergies  Allergen Reactions  . Pecan Nut (Diagnostic) Hives  . Reglan [Metoclopramide] Hives    Social History:   reports that she has never smoked. She has never used smokeless tobacco. She reports that she does not drink alcohol or use drugs.       Family History  Problem Relation Age of Onset  . Hypertension Maternal Grandmother   . Skin cancer Maternal Grandmother   . Heart disease Maternal Grandfather     Review of Systems: Noncontributory  PHYSICAL EXAM: Blood pressure 112/64, pulse 80, weight 125 lb 9.6  oz (57 kg), last menstrual period 11/15/2016, unknown if currently breastfeeding. General appearance - alert, well appearing, and in no distress Chest - clear to auscultation, no wheezes, rales or rhonchi, symmetric air entry Heart - normal rate and regular rhythm Abdomen - soft, nontender, nondistended, no masses or organomegaly  Pelvic - light vaginal bleeding with yellow/brown discharge from cervical os. Uterus is minimally enlarged and anteflexed. Bimanual exam normal Extremities - peripheral pulses normal, no pedal edema, no clubbing or cyanosis  Labs:      Results for orders placed or performed during the hospital encounter of 01/22/17 (from the past 336 hour(s))  Lipase, blood   Collection Time: 01/22/17  6:38 PM  Result Value Ref Range   Lipase 26 11 - 51 U/L  Comprehensive metabolic panel   Collection Time: 01/22/17  6:38 PM  Result Value Ref Range   Sodium 131 (L) 135 - 145 mmol/L    Potassium 3.7 3.5 - 5.1 mmol/L   Chloride 101 101 - 111 mmol/L   CO2 22 22 - 32 mmol/L   Glucose, Bld 91 65 - 99 mg/dL   BUN 7 6 - 20 mg/dL   Creatinine, Ser 1.610.52 0.44 - 1.00 mg/dL   Calcium 9.4 8.9 - 09.610.3 mg/dL   Total Protein 8.1 6.5 - 8.1 g/dL   Albumin 4.6 3.5 - 5.0 g/dL   AST 17 15 - 41 U/L   ALT 12 (L) 14 - 54 U/L   Alkaline Phosphatase 35 (L) 38 - 126 U/L   Total Bilirubin 1.1 0.3 - 1.2 mg/dL   GFR calc non Af Amer >60 >60 mL/min   GFR calc Af Amer >60 >60 mL/min   Anion gap 8 5 - 15  CBC   Collection Time: 01/22/17  6:38 PM  Result Value Ref Range   WBC 10.5 4.0 - 10.5 K/uL   RBC 4.77 3.87 - 5.11 MIL/uL   Hemoglobin 12.9 12.0 - 15.0 g/dL   HCT 04.537.9 40.936.0 - 81.146.0 %   MCV 79.5 78.0 - 100.0 fL   MCH 27.0 26.0 - 34.0 pg   MCHC 34.0 30.0 - 36.0 g/dL   RDW 91.415.0 78.211.5 - 95.615.5 %   Platelets 187 150 - 400 K/uL    Imaging Studies:  ImagingResults  [place] Ob Comp Less 14 Wks  Result Date: 01/16/2017 DATING AND VIABILITY SONOGRAM Carol Bernard is a 22 y.o. year old G2P0010 with LMP 11/05/2016 which would correlate to  7+[redacted] weeks gestation.  She has regular menstrual cycles.   She is here today for a confirmatory initial sonogram. GESTATION: SINGLETON   FETAL ACTIVITY:          Heart rate         NO FHT         CERVIX: Appears closed ADNEXA: The ovaries are normal. GESTATIONAL AGE AND  BIOMETRICS: Gestational criteria: Estimated Date of Delivery: 08/22/2017. by LMP now at 7+4 wks Previous Scans:0 GESTATIONAL SAC           30.8 mm         8+2 weeks CROWN RUMP LENGTH           1.3 mm         5+2 weeks  AVERAGE EGA(BY THIS SCAN):  5+2 weeks EDD: to be determined  TECHNICIAN COMMENTS: [place] TA/TV: 8+2 wks GS,w/ys,crl 1.3 mm correlates to 5+2 wks fetal pole,no FHT,normal ov's bilat,pt is seeing Kim following ultrasound, A copy of this report including all images has been saved and backed up to a second  source for retrieval if needed. All measures and details of the anatomical scan, placentation, fluid volume and pelvic anatomy are contained in that report. Amber J Carl 01/07/2017 1:28 PM 1. Size<<dates descrepancy, suggestive of missed ab., but would recommend Serial u/s in one week, or serial HCG's. 2. Intrauterine pregnancy confirmed.   US Ob Transvaginal  Result Date: 01/16/2017 DATING AND VIABILITY SONOGRAM VICI NOVICK is a 22 y.o. year old G2P0010 with LMP 11/05/2016 which would correlate to  7+[redacted] weeks gestation.  She has regular menstrual cycles.   She is here today for a confirmatory initial sonogram. GESTATION: SINGLETON   FETAL ACTIVITY:          Heart rate         NO FHT         CERVIX: Appears closed ADNEXA: The ovaries are normal. GESTATIONAL AGE AND  BIOMETRICS: Gestational criteria: Estimated Date of Delivery: 08/22/2017. by LMP now at 7+4 wks Previous Scans:0 GESTATIONAL SAC           30.8 mm         8+2 weeks CROWN RUMP LENGTH           1.3 mm         5+2 weeks                                                                      AVERAGE EGA(BY THIS SCAN):  5+2 weeks EDD: to be determined  TECHNICIAN COMMENTS: [place] TA/TV: 8+2 wks GS,w/ys,crl 1.3 mm correlates to 5+2 wks fetal pole,no FHT,normal ov's bilat,pt is seeing Kim following ultrasound, A copy of this report including all images has been saved and backed up to a second source for retrieval if needed. All measures and details of the anatomical scan, placentation, fluid volume and pelvic anatomy are contained in that report. Amber J Carl 01/07/2017 1:28 PM 1. Size<<dates descrepancy, suggestive of missed ab., but would recommend Serial u/s in one week, or serial HCG's. 2. Intrauterine pregnancy confirmed.     Assessment:     Patient Active Problem List   Diagnosis Date Noted  . Missed abortion,  S/p failed cytotec treatment for missed abortion     Plan: Patient will undergo surgical management with a D&C. Scheduled for  1/30 Will prescribe Tylenol with codeine to take with ibuprofen for pain control   Procedure scheduled at [facility] for 01/29/17  By signing my name below, I, Sonum Patel, attest that this documentation has been prepared under the direction and in the presence of Tilda Burrow, MD. Electronically Signed: Sonum Patel, Neurosurgeon. 01/28/17. 3:49 PM.  I personally performed the services described in this documentation, which was SCRIBED in my presence. The recorded information has been reviewed and considered accurate. It has been edited as necessary during review. Tilda Burrow, MD

## 2017-01-29 NOTE — Anesthesia Preprocedure Evaluation (Signed)
Anesthesia Evaluation  Patient identified by MRN, date of birth, ID band Patient awake    Reviewed: Allergy & Precautions, NPO status , Patient's Chart, lab work & pertinent test results  Airway Mallampati: I  TM Distance: >3 FB Neck ROM: Full    Dental  (+) Teeth Intact   Pulmonary asthma ,    breath sounds clear to auscultation       Cardiovascular + dysrhythmias ("palpiteations")  Rhythm:Regular Rate:Normal     Neuro/Psych  Headaches, PSYCHIATRIC DISORDERS Anxiety Depression    GI/Hepatic GERD (inactive now)  Controlled,  Endo/Other    Renal/GU      Musculoskeletal   Abdominal   Peds  Hematology   Anesthesia Other Findings N&V this AM.  Reproductive/Obstetrics                             Anesthesia Physical Anesthesia Plan  ASA: II  Anesthesia Plan: General   Post-op Pain Management:    Induction: Intravenous, Rapid sequence and Cricoid pressure planned  Airway Management Planned: Oral ETT  Additional Equipment:   Intra-op Plan:   Post-operative Plan: Extubation in OR  Informed Consent: I have reviewed the patients History and Physical, chart, labs and discussed the procedure including the risks, benefits and alternatives for the proposed anesthesia with the patient or authorized representative who has indicated his/her understanding and acceptance.     Plan Discussed with:   Anesthesia Plan Comments:         Anesthesia Quick Evaluation

## 2017-01-29 NOTE — Consult Note (Signed)
Full note to follow. She had post op stridor which is better. History of mild asthma.  Family says she is back to baseline.

## 2017-01-29 NOTE — Anesthesia Postprocedure Evaluation (Signed)
Anesthesia Post Note  Patient: Carol Bernard  Procedure(s) Performed: Procedure(s) (LRB): SUCTION DILATATION AND CURETTAGE (N/A)  Patient location during evaluation: PACU Anesthesia Type: General Level of consciousness: awake and alert Pain control: mild pain Lt shoulder. Vital Signs Assessment: post-procedure vital signs reviewed and stable Respiratory status: spontaneous breathing and patient connected to nasal cannula oxygen Cardiovascular status: stable Postop Assessment: no signs of nausea or vomiting Anesthesia complication: Continue monitoring.     Last Vitals:  Vitals:   01/29/17 1330 01/29/17 1345  BP: (!) 134/111 136/78  Pulse: (!) 123 (!) 119  Resp: (!) 24 20  Temp:      Last Pain:  Vitals:   01/29/17 1330  PainSc: 3                  Keland Peyton

## 2017-01-29 NOTE — Progress Notes (Signed)
Patient starting to relax more. Chest x-ray was normal. Dr. Emelda FearFerguson to come back and reassess patient.

## 2017-01-30 ENCOUNTER — Encounter (HOSPITAL_COMMUNITY): Payer: Self-pay | Admitting: Radiology

## 2017-01-30 ENCOUNTER — Observation Stay (HOSPITAL_COMMUNITY): Payer: BLUE CROSS/BLUE SHIELD

## 2017-01-30 DIAGNOSIS — O021 Missed abortion: Secondary | ICD-10-CM | POA: Diagnosis not present

## 2017-01-30 LAB — CBC WITH DIFFERENTIAL/PLATELET
BASOS ABS: 0 10*3/uL (ref 0.0–0.1)
BASOS PCT: 0 %
Eosinophils Absolute: 0 10*3/uL (ref 0.0–0.7)
Eosinophils Relative: 0 %
HCT: 27.6 % — ABNORMAL LOW (ref 36.0–46.0)
HEMOGLOBIN: 9.5 g/dL — AB (ref 12.0–15.0)
Lymphocytes Relative: 6 %
Lymphs Abs: 0.8 10*3/uL (ref 0.7–4.0)
MCH: 27.9 pg (ref 26.0–34.0)
MCHC: 34.4 g/dL (ref 30.0–36.0)
MCV: 81.2 fL (ref 78.0–100.0)
MONOS PCT: 4 %
Monocytes Absolute: 0.5 10*3/uL (ref 0.1–1.0)
NEUTROS ABS: 11 10*3/uL — AB (ref 1.7–7.7)
NEUTROS PCT: 90 %
Platelets: 147 10*3/uL — ABNORMAL LOW (ref 150–400)
RBC: 3.4 MIL/uL — ABNORMAL LOW (ref 3.87–5.11)
RDW: 15.4 % (ref 11.5–15.5)
WBC: 12.3 10*3/uL — ABNORMAL HIGH (ref 4.0–10.5)

## 2017-01-30 LAB — RPR: RPR Ser Ql: NONREACTIVE

## 2017-01-30 MED ORDER — DOXYCYCLINE HYCLATE 100 MG PO CAPS: 100.0000 mg | ORAL_CAPSULE | Freq: Two times a day (BID) | ORAL | 1 refills | Status: DC

## 2017-01-30 NOTE — Discharge Summary (Signed)
Physician Discharge Summary  Patient ID: Carol Bernard MRN: [id number] DOB/AGE: 1995-06-16 21 y.o.  Admit date: 01/29/2017 Discharge date: 01/30/2017  Admission Diagnoses:Stridor after suction D&C  Discharge Diagnoses: Resolved respiratory stridor, symmetric position Active Problems:   * No active hospital problems. * Depression without suicidal ideation  Discharged Condition: good  Hospital Course: Day of Surgery Procedure(s) (LRB): SUCTION DILATATION AND CURETTAGE (N/A) Rolly SalterHaley was admitted after a dramatic presentation , somewhat histrionic, of respiratory stridor postop after Suction D&C.  Workup by Anesthesia and then myself included albuterol, then EKG (nl), CXR (nl), and then sedation, restarting pt's reported preop anxiolytics.  Consults: pulmonary/intensive care  Significant Diagnostic Studies: labs:  CBC Latest Ref Rng & Units 01/30/2017 01/29/2017 01/29/2017  WBC 4.0 - 10.5 K/uL 12.3(H) 7.7 6.4  Hemoglobin 12.0 - 15.0 g/dL 8.6(V9.5(L) 7.8(I9.7(L) 11.3(L)  Hematocrit 36.0 - 46.0 % 27.6(L) 27.8(L) 32.2(L)  Platelets 150 - 400 K/uL 147(L) 138(L) 143(L)    and radiology: X-Ray: acute abd series, normal pelvic ultrasound confirming emptied uterus  Treatments: IV hydration and respiratory therapy: O2 and albuterol/atropine nebulizer  Discharge Exam: Blood pressure 117/71, pulse 72, temperature 99.2 F (37.3 C), temperature source Oral, resp. rate 16, SpO2 100 %, unknown if currently breastfeeding. General appearance: alert, cooperative, distracted and tearful anxious, greiving over loss and uncertain of home support Head: Normocephalic, without obvious abnormality, atraumatic Resp: clear to auscultation bilaterally Chest wall: no tenderness Cardio: regular rate and rhythm GI: soft, non-tender; bowel sounds normal; no masses,  no organomegaly Pelvic: see u/s report Neurologic: Alert and oriented X 3, normal strength and tone. Normal symmetric reflexes. Normal coordination and  gait Mental status: Alert, oriented, thought content appropriate, affect: labile  Disposition: 01-Home or Self Care  Discharge Instructions    Call MD for:  severe uncontrolled pain    Complete by:  As directed    Call MD for:  temperature >100.4    Complete by:  As directed    Diet - low sodium heart healthy    Complete by:  As directed    Diet - low sodium heart healthy    Complete by:  As directed    Increase activity slowly    Complete by:  As directed    Increase activity slowly    Complete by:  As directed       Follow-up Information    Tilda BurrowFERGUSON,Devinn Hurwitz V, MD Follow up in 2 week(s).   Specialties:  Obstetrics and Gynecology, Radiology Why:  Postoperative visit Contact information: [address] [place] [address] [phone]           Signed: Tilda BurrowFERGUSON,Bretta Fees V 01/30/2017, 4:22 PM

## 2017-01-30 NOTE — Addendum Note (Signed)
Addendum  created 01/30/17 1502 by Despina Hiddenobert J Gennesis Hogland, CRNA   Sign clinical note

## 2017-01-30 NOTE — Consult Note (Signed)
Consult requested by: Dr. Emelda Fear Consult requested for stridor:  HPI: This is the completion of consult note started yesterday after my exam around 6:00 PM. She was intubated for D&C than when she was extubated had some stridor. According to her family at bedside she does have some history of relatively mild asthma. She uses a rescue inhaler only and doesn't use that very much. When I saw her she looked sleepy but did not have any stridor. Family confirmed that her voice was normal. No chest pain. Not short of breath. No other new complaints. No nausea or vomiting.  Past Medical History:  Diagnosis Date  . Anxiety   . Anxiety and depression   . Asthma   . Birth defect   . Depression   . Dysrhythmia    h/o SVT  . GERD (gastroesophageal reflux disease)   . Headache   . Hypoglycemia   . Palpitations      Family History  Problem Relation Age of Onset  . Hypertension Maternal Grandmother   . Skin cancer Maternal Grandmother   . Heart disease Maternal Grandfather      Social History   Social History  . Marital status: Single    Spouse name: N/A  . Number of children: N/A  . Years of education: N/A   Social History Main Topics  . Smoking status: Never Smoker  . Smokeless tobacco: Never Used  . Alcohol use No  . Drug use: No  . Sexual activity: Yes    Birth control/ protection: None   Other Topics Concern  . None   Social History Narrative  . None     ROS: Except as mentioned above 10 point review of systems is negative    Objective: Vital signs in last 24 hours: Temp:  [97.6 F (36.4 C)-99.2 F (37.3 C)] 99.2 F (37.3 C) (01/31 0517) Pulse Rate:  [69-135] 74 (01/31 0517) Resp:  [13-35] 20 (01/31 0517) BP: (103-162)/(55-122) 105/63 (01/31 0517) SpO2:  [97 %-100 %] 100 % (01/31 0517) Weight change:  Last BM Date: 01/29/17  Intake/Output from previous day: 01/30 0701 - 01/31 0700 In: 2433.3 [I.V.:2433.3] Out: 400 [Blood:400]  PHYSICAL  EXAM Constitutional: She is awake but sleepy. She is very thin. Eyes: Pupils react. EOMI. Ears, nose and throat: Mucous membranes are moist. Pharynx clear. Cardiovascular: Her heart is regular with normal heart sounds. No edema. Respiratory: Normal respiratory effort. She doesn't seem to have any stridor now. Lungs are clear. Gastrointestinal: Her abdomen is soft with no masses. Musculoskeletal: Normal strength. Skin: Warm and dry psychiatric: Sleepy. She had been anxious previously it does not appear anxious now  Lab Results: Basic Metabolic Panel:  Recent Labs  16/10/96 2044  NA 137  K 3.9  CL 110  CO2 21*  GLUCOSE 180*  BUN 5*  CREATININE 0.54  CALCIUM 8.4*   Liver Function Tests: No results for input(s): AST, ALT, ALKPHOS, BILITOT, PROT, ALBUMIN in the last 72 hours. No results for input(s): LIPASE, AMYLASE in the last 72 hours. No results for input(s): AMMONIA in the last 72 hours. CBC:  Recent Labs  01/29/17 1047 01/29/17 2044  WBC 6.4 7.7  HGB 11.3* 9.7*  HCT 32.2* 27.8*  MCV 80.1 79.7  PLT 143* 138*   Cardiac Enzymes: No results for input(s): CKTOTAL, CKMB, CKMBINDEX, TROPONINI in the last 72 hours. BNP: No results for input(s): PROBNP in the last 72 hours. D-Dimer: No results for input(s): DDIMER in the last 72 hours. CBG: No results  for input(s): GLUCAP in the last 72 hours. Hemoglobin A1C: No results for input(s): HGBA1C in the last 72 hours. Fasting Lipid Panel: No results for input(s): CHOL, HDL, LDLCALC, TRIG, CHOLHDL, LDLDIRECT in the last 72 hours. Thyroid Function Tests: No results for input(s): TSH, T4TOTAL, FREET4, T3FREE, THYROIDAB in the last 72 hours. Anemia Panel: No results for input(s): VITAMINB12, FOLATE, FERRITIN, TIBC, IRON, RETICCTPCT in the last 72 hours. Coagulation: No results for input(s): LABPROT, INR in the last 72 hours. Urine Drug Screen: Drugs of Abuse  No results found for: LABOPIA, COCAINSCRNUR, LABBENZ, AMPHETMU, THCU,  LABBARB  Alcohol Level: No results for input(s): ETH in the last 72 hours. Urinalysis: No results for input(s): COLORURINE, LABSPEC, PHURINE, GLUCOSEU, HGBUR, BILIRUBINUR, KETONESUR, PROTEINUR, UROBILINOGEN, NITRITE, LEUKOCYTESUR in the last 72 hours.  Invalid input(s): APPERANCEUR Misc. Labs:   ABGS: No results for input(s): PHART, PO2ART, TCO2, HCO3 in the last 72 hours.  Invalid input(s): PCO2   MICROBIOLOGY: No results found for this or any previous visit (from the past 240 hour(s)).  Studies/Results: Dg Chest Port 1 View  Result Date: 01/29/2017 CLINICAL DATA:  22 year old female with shortness of breath following surgical management of miscarriage. Initial encounter. EXAM: PORTABLE CHEST 1 VIEW COMPARISON:  Thoracic spine radiographs 03/29/2014 FINDINGS: Portable AP upright view at 1333 hours. Normal cardiac size and mediastinal contours. Visualized tracheal air column is within normal limits. Allowing for portable technique the lungs are clear. No pneumothorax or pleural effusion. Negative visible bowel gas pattern. No acute osseous abnormality identified. IMPRESSION: Negative.  No acute cardiopulmonary abnormality. Electronically Signed   By: Odessa FlemingH  Hall M.D.   On: 01/29/2017 13:48    Medications:  Prior to Admission:  Prescriptions Prior to Admission  Medication Sig Dispense Refill Last Dose  . busPIRone (BUSPAR) 5 MG tablet Take 5 mg by mouth 2 (two) times daily.   01/28/2017 at Unknown time  . diazepam (VALIUM) 5 MG tablet Take 5 mg by mouth 2 (two) times daily as needed for anxiety.   01/28/2017 at Unknown time  . Prenatal Multivit-Min-Fe-FA (PRENATAL VITAMINS PO) Take 1 tablet by mouth daily.    01/28/2017 at Unknown time  . promethazine (PHENERGAN) 25 MG tablet Take 0.5-1 tablets (12.5-25 mg total) by mouth every 6 (six) hours as needed for nausea or vomiting. 30 tablet 0 01/28/2017 at Unknown time  . venlafaxine XR (EFFEXOR-XR) 37.5 MG 24 hr capsule Take 37.5 mg by mouth daily  with breakfast.   01/28/2017 at Unknown time  . [DISCONTINUED] acetaminophen-codeine (TYLENOL #3) 300-30 MG tablet Take 1-2 tablets by mouth every 4 (four) hours as needed for moderate pain. 20 tablet 0 01/28/2017 at Unknown time   Scheduled: . busPIRone  5 mg Oral BID  . diazepam  5 mg Oral BID  . methylPREDNISolone (SOLU-MEDROL) injection  40 mg Intravenous Q12H  . pantoprazole  40 mg Oral Daily  . prenatal multivitamin  1 tablet Oral Q1200  . venlafaxine XR  37.5 mg Oral Q breakfast   Continuous: . sodium chloride 100 mL/hr at 01/30/17 0513   ZOX:[id number]:albuterol, ibuprofen, ondansetron **OR** ondansetron (ZOFRAN) IV, oxyCODONE-acetaminophen, promethazine, Racepinephrine HCl  Assesment: She appeared to have some stridor/laryngospasm after being extubated. She has a history of asthma at baseline. I have left orders for her to continue Solu-Medrol overnight and for her to have racemic epinephrine available but I don't think she needs another treatment now. Active Problems:   Laryngospasm    Plan: As above. Thanks for allowing me to see  her with you    LOS: 0 days   Hudsen Fei L 01/30/2017, 7:35 AM

## 2017-01-30 NOTE — Anesthesia Postprocedure Evaluation (Signed)
Anesthesia Post Note  Patient: Carol Bernard  Procedure(s) Performed: Procedure(s) (LRB): SUCTION DILATATION AND CURETTAGE (N/A)  Patient location during evaluation: Nursing Unit Anesthesia Type: General Level of consciousness: awake, patient cooperative and oriented Pain management: pain level controlled Vital Signs Assessment: post-procedure vital signs reviewed and stable Respiratory status: spontaneous breathing, nonlabored ventilation and respiratory function stable Cardiovascular status: blood pressure returned to baseline Postop Assessment: no signs of nausea or vomiting Comments: Pt to remain overnight per self due to "weakness".VSS.No wheezing or stridor noted on speaking.     Last Vitals:  Vitals:   01/29/17 2222 01/30/17 0517  BP: (!) 103/55 105/63  Pulse: 88 74  Resp: 20 20  Temp: 37.3 C 37.3 C    Last Pain:  Vitals:   01/30/17 1331  TempSrc:   PainSc: 4                  Kenslei Hearty J

## 2017-01-30 NOTE — Progress Notes (Signed)
I discussed her situation with Dr. Emelda FearFerguson this morning and he plans to let her go home. I will sign off.

## 2017-01-30 NOTE — Progress Notes (Signed)
Pt being discharged home, vitals stable, no c/o pain, no questions for the nurse
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
## 2017-01-31 ENCOUNTER — Encounter (HOSPITAL_COMMUNITY): Payer: Self-pay | Admitting: Obstetrics and Gynecology

## 2017-01-31 LAB — GC/CHLAMYDIA PROBE AMP
CHLAMYDIA, DNA PROBE: NEGATIVE
NEISSERIA GONORRHOEAE BY PCR: NEGATIVE

## 2017-02-11 ENCOUNTER — Encounter: Payer: Self-pay | Admitting: Obstetrics and Gynecology

## 2017-02-11 ENCOUNTER — Ambulatory Visit (INDEPENDENT_AMBULATORY_CARE_PROVIDER_SITE_OTHER): Payer: BLUE CROSS/BLUE SHIELD | Admitting: Obstetrics and Gynecology

## 2017-02-11 VITALS — BP 98/58 | Wt 120.8 lb

## 2017-02-11 DIAGNOSIS — N938 Other specified abnormal uterine and vaginal bleeding: Secondary | ICD-10-CM | POA: Diagnosis not present

## 2017-02-11 DIAGNOSIS — Z09 Encounter for follow-up examination after completed treatment for conditions other than malignant neoplasm: Secondary | ICD-10-CM

## 2017-02-11 DIAGNOSIS — R300 Dysuria: Secondary | ICD-10-CM

## 2017-02-11 NOTE — Progress Notes (Signed)
Patient ID: Carol Bernard, female   DOB: 01/25/1995, 22 y.o.   MRN: [id number]  Subjective:  Carol Bernard is a 22 y.o. female now 2 weeks status post suction D&C.   Chief Complaint  Patient presents with  . Routine Post Op    D&C    Pt states she is still experiencing light vaginal bleeding. She also complains of dysuria.  Pt states her dysuria is similar to prior UTI. No worsening or alleviating factors noted.    Review of Systems Negative except dysuria, vaginal spotting    Diet:   Normal    Bowel movements : normal.  The patient is not having any pain.  Objective:  BP (!) 98/58 (BP Location: Right Arm, Patient Position: Sitting, Cuff Size: Normal)   Wt 120 lb 12.8 oz (54.8 kg)   BMI 20.74 kg/m  General:Well developed, well nourished.  No acute distress. Abdomen: Bowel sounds normal, soft, non-tender. Pelvic Exam:    External Genitalia:  Normal.    Vagina: Normal   Bedside transvaginal US reveals:  Anteverted and anteflexed uterus with thin symmetric endometrium, 5.8 mm thick.   Assessment:  Post-Op 2 weeks status post suction D&C. That was notable for increased   Doing well postoperatively.   Plan:  1.Wound care discussed   2. . current medications: none 3. Activity restrictions: none 4. return to work: now. 5. Follow up in PRN .   By signing my name below, I, Doreatha MartinEva Mathews, attest that this documentation has been prepared under the direction and in the presence of Tilda BurrowJohn Flynt Breeze V, MD. Electronically Signed: Doreatha MartinEva Mathews, ED Scribe. 02/11/17. 2:47 PM.  I personally performed the services described in this documentation, which was SCRIBED in my presence. The recorded information has been reviewed and considered accurate. It has been edited as necessary during review. Tilda BurrowFERGUSON,Avedis Bevis V, MD   I personally performed the services described in this documentation, which was SCRIBED in my presence. The recorded information has been reviewed and considered accurate. It has been  edited as necessary during review. Tilda BurrowFERGUSON,Marceline Napierala V, MD

## 2017-02-14 DIAGNOSIS — Z09 Encounter for follow-up examination after completed treatment for conditions other than malignant neoplasm: Secondary | ICD-10-CM | POA: Insufficient documentation

## 2017-02-18 ENCOUNTER — Telehealth: Payer: Self-pay | Admitting: *Deleted

## 2017-02-19 ENCOUNTER — Encounter: Payer: Self-pay | Admitting: *Deleted

## 2017-02-19 NOTE — Telephone Encounter (Signed)
Patient requesting work excuse note from being out of school due to miscarriage and surgery. Note printed and signed. Will pick up later today.

## 2017-03-05 ENCOUNTER — Telehealth: Payer: Self-pay | Admitting: *Deleted

## 2017-03-05 NOTE — Telephone Encounter (Signed)
Patient called wanting to know when should could conceived. She and her boyfriend are having sex frequently, with no protection and wants to know how she can conceive sooner. I advised patient that since her last period was 2/25 she should be ovulating towards the end of the week to first of next week. Pt verbalized understanding.

## 2017-03-25 ENCOUNTER — Encounter: Payer: Self-pay | Admitting: Adult Health

## 2017-03-25 ENCOUNTER — Ambulatory Visit (INDEPENDENT_AMBULATORY_CARE_PROVIDER_SITE_OTHER): Payer: BLUE CROSS/BLUE SHIELD | Admitting: Adult Health

## 2017-03-25 VITALS — BP 110/58 | HR 82 | Ht 63.5 in | Wt 126.5 lb

## 2017-03-25 DIAGNOSIS — N96 Recurrent pregnancy loss: Secondary | ICD-10-CM | POA: Diagnosis not present

## 2017-03-25 DIAGNOSIS — R300 Dysuria: Secondary | ICD-10-CM

## 2017-03-25 DIAGNOSIS — Z3490 Encounter for supervision of normal pregnancy, unspecified, unspecified trimester: Secondary | ICD-10-CM

## 2017-03-25 DIAGNOSIS — Z3201 Encounter for pregnancy test, result positive: Secondary | ICD-10-CM | POA: Diagnosis not present

## 2017-03-25 DIAGNOSIS — O0990 Supervision of high risk pregnancy, unspecified, unspecified trimester: Secondary | ICD-10-CM | POA: Insufficient documentation

## 2017-03-25 DIAGNOSIS — O3680X0 Pregnancy with inconclusive fetal viability, not applicable or unspecified: Secondary | ICD-10-CM

## 2017-03-25 LAB — POCT URINALYSIS DIPSTICK
Glucose, UA: NEGATIVE
Ketones, UA: NEGATIVE
Nitrite, UA: NEGATIVE
PROTEIN UA: NEGATIVE
RBC UA: NEGATIVE

## 2017-03-25 LAB — POCT URINE PREGNANCY: PREG TEST UR: POSITIVE — AB

## 2017-03-25 NOTE — Progress Notes (Signed)
Subjective:     Patient ID: Carol Bernard, female   DOB: Nov 27, 1995, 22 y.o.   MRN: [id number]  HPI Carol Bernard is a 22 year old white female in for UPT, she had D&C 01/29/17.+ nausea.  Review of Systems +missed period +nausea Had burning with urination, but has resolved    Objective:   Physical Exam BP (!) 110/58 (BP Location: Left Arm, Patient Position: Sitting, Cuff Size: Normal)   Pulse 82   Ht 5' 3.5" (1.613 m)   Wt 126 lb 8 oz (57.4 kg)   LMP 02/24/2017   Breastfeeding? No   BMI 22.06 kg/m  Urine dipstick:trace leuks UPT +, about 4+1 week by LMP, with EDD 12/01/17.Skin warm and dry. Neck: mid line trachea, normal thyroid, good ROM, no lymphadenopathy noted. Lungs: clear to ausculation bilaterally. Cardiovascular: regular rate and rhythm.Abdomen is soft and non tender.   Blood type is A+. [facility] and US in 3 weeks.  Assessment:     1. Pregnancy examination or test, positive result   2. Pregnancy, unspecified gestational age   273. Encounter to determine fetal viability of pregnancy, single or unspecified fetus   4. Burning with urination   5. History of multiple miscarriages       Plan:     Check QHCG and progesterone level  Continue PNV Return in 3 weeks for dating US Review handout on first trimester

## 2017-03-25 NOTE — Patient Instructions (Signed)
First Trimester of Pregnancy The first trimester of pregnancy is from week 1 until the end of week 13 (months 1 through 3). A week after a sperm fertilizes an egg, the egg will implant on the wall of the uterus. This embryo will begin to develop into a baby. Genes from you and your partner will form the baby. The female genes will determine whether the baby will be a boy or a girl. At 6-8 weeks, the eyes and face will be formed, and the heartbeat can be seen on ultrasound. At the end of 12 weeks, all the baby's organs will be formed. Now that you are pregnant, you will want to do everything you can to have a healthy baby. Two of the most important things are to get good prenatal care and to follow your health care provider's instructions. Prenatal care is all the medical care you receive before the baby's birth. This care will help prevent, find, and treat any problems during the pregnancy and childbirth. Body changes during your first trimester Your body goes through many changes during pregnancy. The changes vary from woman to woman.  You may gain or lose a couple of pounds at first.  You may feel sick to your stomach (nauseous) and you may throw up (vomit). If the vomiting is uncontrollable, call your health care provider.  You may tire easily.  You may develop headaches that can be relieved by medicines. All medicines should be approved by your health care provider.  You may urinate more often. Painful urination may mean you have a bladder infection.  You may develop heartburn as a result of your pregnancy.  You may develop constipation because certain hormones are causing the muscles that push stool through your intestines to slow down.  You may develop hemorrhoids or swollen veins (varicose veins).  Your breasts may begin to grow larger and become tender. Your nipples may stick out more, and the tissue that surrounds them (areola) may become darker.  Your gums may bleed and may be  sensitive to brushing and flossing.  Dark spots or blotches (chloasma, mask of pregnancy) may develop on your face. This will likely fade after the baby is born.  Your menstrual periods will stop.  You may have a loss of appetite.  You may develop cravings for certain kinds of food.  You may have changes in your emotions from day to day, such as being excited to be pregnant or being concerned that something may go wrong with the pregnancy and baby.  You may have more vivid and strange dreams.  You may have changes in your hair. These can include thickening of your hair, rapid growth, and changes in texture. Some women also have hair loss during or after pregnancy, or hair that feels dry or thin. Your hair will most likely return to normal after your baby is born.  What to expect at prenatal visits During a routine prenatal visit:  You will be weighed to make sure you and the baby are growing normally.  Your blood pressure will be taken.  Your abdomen will be measured to track your baby's growth.  The fetal heartbeat will be listened to between weeks 10 and 14 of your pregnancy.  Test results from any previous visits will be discussed.  Your health care provider may ask you:  How you are feeling.  If you are feeling the baby move.  If you have had any abnormal symptoms, such as leaking fluid, bleeding, severe headaches,   or abdominal cramping.  If you are using any tobacco products, including cigarettes, chewing tobacco, and electronic cigarettes.  If you have any questions.  Other tests that may be performed during your first trimester include:  Blood tests to find your blood type and to check for the presence of any previous infections. The tests will also be used to check for low iron levels (anemia) and protein on red blood cells (Rh antibodies). Depending on your risk factors, or if you previously had diabetes during pregnancy, you may have tests to check for high blood  sugar that affects pregnant women (gestational diabetes).  Urine tests to check for infections, diabetes, or protein in the urine.  An ultrasound to confirm the proper growth and development of the baby.  Fetal screens for spinal cord problems (spina bifida) and Down syndrome.  HIV (human immunodeficiency virus) testing. Routine prenatal testing includes screening for HIV, unless you choose not to have this test.  You may need other tests to make sure you and the baby are doing well.  Follow these instructions at home: Medicines  Follow your health care provider's instructions regarding medicine use. Specific medicines may be either safe or unsafe to take during pregnancy.  Take a prenatal vitamin that contains at least 600 micrograms (mcg) of folic acid.  If you develop constipation, try taking a stool softener if your health care provider approves. Eating and drinking  Eat a balanced diet that includes fresh fruits and vegetables, whole grains, good sources of protein such as meat, eggs, or tofu, and low-fat dairy. Your health care provider will help you determine the amount of weight gain that is right for you.  Avoid raw meat and uncooked cheese. These carry germs that can cause birth defects in the baby.  Eating four or five small meals rather than three large meals a day may help relieve nausea and vomiting. If you start to feel nauseous, eating a few soda crackers can be helpful. Drinking liquids between meals, instead of during meals, also seems to help ease nausea and vomiting.  Limit foods that are high in fat and processed sugars, such as fried and sweet foods.  To prevent constipation: ? Eat foods that are high in fiber, such as fresh fruits and vegetables, whole grains, and beans. ? Drink enough fluid to keep your urine clear or pale yellow. Activity  Exercise only as directed by your health care provider. Most women can continue their usual exercise routine during  pregnancy. Try to exercise for 30 minutes at least 5 days a week. Exercising will help you: ? Control your weight. ? Stay in shape. ? Be prepared for labor and delivery.  Experiencing pain or cramping in the lower abdomen or lower back is a good sign that you should stop exercising. Check with your health care provider before continuing with normal exercises.  Try to avoid standing for long periods of time. Move your legs often if you must stand in one place for a long time.  Avoid heavy lifting.  Wear low-heeled shoes and practice good posture.  You may continue to have sex unless your health care provider tells you not to. Relieving pain and discomfort  Wear a good support bra to relieve breast tenderness.  Take warm sitz baths to soothe any pain or discomfort caused by hemorrhoids. Use hemorrhoid cream if your health care provider approves.  Rest with your legs elevated if you have leg cramps or low back pain.  If you develop   varicose veins in your legs, wear support hose. Elevate your feet for 15 minutes, 3-4 times a day. Limit salt in your diet. Prenatal care  Schedule your prenatal visits by the twelfth week of pregnancy. They are usually scheduled monthly at first, then more often in the last 2 months before delivery.  Write down your questions. Take them to your prenatal visits.  Keep all your prenatal visits as told by your health care provider. This is important. Safety  Wear your seat belt at all times when driving.  Make a list of emergency phone numbers, including numbers for family, friends, the hospital, and police and fire departments. General instructions  Ask your health care provider for a referral to a local prenatal education class. Begin classes no later than the beginning of month 6 of your pregnancy.  Ask for help if you have counseling or nutritional needs during pregnancy. Your health care provider can offer advice or refer you to specialists for help  with various needs.  Do not use hot tubs, steam rooms, or saunas.  Do not douche or use tampons or scented sanitary pads.  Do not cross your legs for long periods of time.  Avoid cat litter boxes and soil used by cats. These carry germs that can cause birth defects in the baby and possibly loss of the fetus by miscarriage or stillbirth.  Avoid all smoking, herbs, alcohol, and medicines not prescribed by your health care provider. Chemicals in these products affect the formation and growth of the baby.  Do not use any products that contain nicotine or tobacco, such as cigarettes and e-cigarettes. If you need help quitting, ask your health care provider. You may receive counseling support and other resources to help you quit.  Schedule a dentist appointment. At home, brush your teeth with a soft toothbrush and be gentle when you floss. Contact a health care provider if:  You have dizziness.  You have mild pelvic cramps, pelvic pressure, or nagging pain in the abdominal area.  You have persistent nausea, vomiting, or diarrhea.  You have a bad smelling vaginal discharge.  You have pain when you urinate.  You notice increased swelling in your face, hands, legs, or ankles.  You are exposed to fifth disease or chickenpox.  You are exposed to German measles (rubella) and have never had it. Get help right away if:  You have a fever.  You are leaking fluid from your vagina.  You have spotting or bleeding from your vagina.  You have severe abdominal cramping or pain.  You have rapid weight gain or loss.  You vomit blood or material that looks like coffee grounds.  You develop a severe headache.  You have shortness of breath.  You have any kind of trauma, such as from a fall or a car accident. Summary  The first trimester of pregnancy is from week 1 until the end of week 13 (months 1 through 3).  Your body goes through many changes during pregnancy. The changes vary from  woman to woman.  You will have routine prenatal visits. During those visits, your health care provider will examine you, discuss any test results you may have, and talk with you about how you are feeling. This information is not intended to replace advice given to you by your health care provider. Make sure you discuss any questions you have with your health care provider. Document Released: 12/11/2001 Document Revised: 11/28/2016 Document Reviewed: 11/28/2016 Elsevier Interactive Patient Education  2017 Elsevier   Inc.  

## 2017-03-26 ENCOUNTER — Telehealth: Payer: Self-pay | Admitting: Adult Health

## 2017-03-26 LAB — BETA HCG QUANT (REF LAB): hCG Quant: 81 m[IU]/mL

## 2017-03-26 LAB — PROGESTERONE: Progesterone: 25.6 ng/mL

## 2017-03-26 NOTE — Telephone Encounter (Signed)
Spoke with pt letting her know progesterone is good and quant suggests around [redacted] weeks pregnant. Advised to keep appt for US on 4/16. Call with any concerns. Pt voiced understanding. JSY

## 2017-03-26 NOTE — Telephone Encounter (Signed)
Pt called stating that she would like to know the results of her blood work that she had done yesterday. Please contact pt

## 2017-04-01 ENCOUNTER — Telehealth: Payer: Self-pay | Admitting: *Deleted

## 2017-04-01 DIAGNOSIS — Z3490 Encounter for supervision of normal pregnancy, unspecified, unspecified trimester: Secondary | ICD-10-CM

## 2017-04-01 NOTE — Telephone Encounter (Signed)
Patient called requesting Hcg and progesterone levels be rechecked again, she states she is "very paranoid". I informed patient that her labs were just drawn on 3/26 and generally we would wait a couple of weeks to recheck if necessary. Patient has appointment on 4/16. Please advise.

## 2017-04-01 NOTE — Telephone Encounter (Signed)
Will check QHCG and progesterone level at her request, she is worried about miscarriage, has no pregnancy symptoms

## 2017-04-01 NOTE — Addendum Note (Signed)
Addended by: Cyril Mourning A on: 04/01/2017 04:49 PM   Modules accepted: Orders

## 2017-04-03 ENCOUNTER — Telehealth: Payer: Self-pay | Admitting: Adult Health

## 2017-04-03 ENCOUNTER — Telehealth: Payer: Self-pay | Admitting: *Deleted

## 2017-04-03 LAB — PROGESTERONE: PROGESTERONE: 16.4 ng/mL

## 2017-04-03 LAB — BETA HCG QUANT (REF LAB): HCG QUANT: 2513 m[IU]/mL

## 2017-04-03 MED ORDER — PROGESTERONE MICRONIZED 200 MG PO CAPS: ORAL_CAPSULE | ORAL | 3 refills | Status: DC

## 2017-04-03 NOTE — Telephone Encounter (Signed)
Pt wants to start progesterone suppositories to prevent miscarriage. I explained to patient the per Jennifer's note yesterday, her levels were fine and progesterone was not indicated at this time. Patient states she wants to be proactive to prevent the miscarriage. Informed patient I would route message to [facility] to reevaluate. Pt verbalized understanding.

## 2017-04-03 NOTE — Telephone Encounter (Signed)
Pt is nervous over miscarriage again, and wants progesterone, will rx Prometrium 200 mg in vagina at hs

## 2017-04-04 ENCOUNTER — Telehealth: Payer: Self-pay | Admitting: *Deleted

## 2017-04-04 NOTE — Telephone Encounter (Signed)
Patient called concerned about soft stools since starting the Prometrium last night. I informed patient that progesterone is a natural hormone that is released and may cause loose stools along with discharge. Pt states she is just very nervous.

## 2017-04-09 ENCOUNTER — Telehealth: Payer: Self-pay | Admitting: Adult Health

## 2017-04-09 ENCOUNTER — Encounter: Payer: Self-pay | Admitting: *Deleted

## 2017-04-09 ENCOUNTER — Other Ambulatory Visit: Payer: BLUE CROSS/BLUE SHIELD

## 2017-04-09 ENCOUNTER — Other Ambulatory Visit: Payer: Self-pay | Admitting: Adult Health

## 2017-04-09 ENCOUNTER — Encounter: Payer: Self-pay | Admitting: Adult Health

## 2017-04-09 DIAGNOSIS — O09299 Supervision of pregnancy with other poor reproductive or obstetric history, unspecified trimester: Secondary | ICD-10-CM

## 2017-04-09 NOTE — Telephone Encounter (Signed)
Send mychart message

## 2017-04-10 LAB — BETA HCG QUANT (REF LAB): hCG Quant: 13686 m[IU]/mL

## 2017-04-15 ENCOUNTER — Other Ambulatory Visit: Payer: Self-pay | Admitting: Adult Health

## 2017-04-15 ENCOUNTER — Ambulatory Visit (INDEPENDENT_AMBULATORY_CARE_PROVIDER_SITE_OTHER): Payer: BLUE CROSS/BLUE SHIELD

## 2017-04-15 DIAGNOSIS — O3680X0 Pregnancy with inconclusive fetal viability, not applicable or unspecified: Secondary | ICD-10-CM | POA: Diagnosis not present

## 2017-04-15 NOTE — Progress Notes (Addendum)
[place] 5+6 wks,single IUP w/ys,pos fht 92 bpm,normal ovaries bilat,crl 3.36 mm,pt will come back for f/u ultrasound in 10 day,per Victorino Dike, EDD 12/10/2017

## 2017-04-23 ENCOUNTER — Telehealth: Payer: Self-pay | Admitting: *Deleted

## 2017-04-23 ENCOUNTER — Other Ambulatory Visit: Payer: Self-pay | Admitting: Adult Health

## 2017-04-23 DIAGNOSIS — O3680X0 Pregnancy with inconclusive fetal viability, not applicable or unspecified: Secondary | ICD-10-CM

## 2017-04-23 NOTE — Telephone Encounter (Signed)
Patient called wanting to know if she could eat crab legs. Informed patient that was ok. Pt verbalized understanding.

## 2017-04-24 ENCOUNTER — Telehealth: Payer: Self-pay | Admitting: *Deleted

## 2017-04-24 NOTE — Telephone Encounter (Signed)
Patient called with complaints of lower abdominal cramping that comes and goes, no bleeding. Patient states she did have intercourse last night. Informed patient that cramping can occur after intercourse.Pt verbalized understanding.

## 2017-04-25 ENCOUNTER — Other Ambulatory Visit: Payer: Self-pay | Admitting: Adult Health

## 2017-04-25 ENCOUNTER — Telehealth: Payer: Self-pay | Admitting: *Deleted

## 2017-04-25 ENCOUNTER — Ambulatory Visit (INDEPENDENT_AMBULATORY_CARE_PROVIDER_SITE_OTHER): Payer: BLUE CROSS/BLUE SHIELD

## 2017-04-25 ENCOUNTER — Ambulatory Visit: Payer: BLUE CROSS/BLUE SHIELD | Admitting: Adult Health

## 2017-04-25 ENCOUNTER — Encounter: Payer: Self-pay | Admitting: Adult Health

## 2017-04-25 VITALS — BP 92/60 | HR 80 | Ht 64.0 in | Wt 128.5 lb

## 2017-04-25 DIAGNOSIS — O200 Threatened abortion: Secondary | ICD-10-CM

## 2017-04-25 DIAGNOSIS — O3680X0 Pregnancy with inconclusive fetal viability, not applicable or unspecified: Secondary | ICD-10-CM

## 2017-04-25 DIAGNOSIS — O418X10 Other specified disorders of amniotic fluid and membranes, first trimester, not applicable or unspecified: Secondary | ICD-10-CM

## 2017-04-25 DIAGNOSIS — O468X1 Other antepartum hemorrhage, first trimester: Secondary | ICD-10-CM

## 2017-04-25 NOTE — Telephone Encounter (Signed)
Patient called with complaints of continued right lower sided pain. She is not bleeding but is concerned. Informed patient that per Jennifer's note, she does have a subchorionic hem and also a threatened miscarriage. Encouraged rest and push fluids and to follow-up as advised but to go to the ED if she felt was necessary. Patient verbalized understanding.

## 2017-04-25 NOTE — Progress Notes (Signed)
[place] TA/TV:7+2 wks by previous ultrasound,crl 4.68 mm = 6+1 wks,positive FHT 79 bpm (bradycardia),subchorionic hemorrhage 1.3 x  .6 x 1 cm,normal ovaries bilat,pt will see Victorino Dike to discuss results

## 2017-04-25 NOTE — Progress Notes (Signed)
Subjective:     Patient ID: Carol Bernard, female   DOB: [birth year], 22 y.o.   MRN: [id number]  HPI Carol Bernard is a 22 year old white female in for F/U US has history of 2 miscarriages and 1 molar pregnancy, is on Prometrium. Has some pain in right side.  Review of Systems +pain in right side Reviewed past medical,surgical, social and family history. Reviewed medications and allergies.     Objective:   Physical Exam BP 92/60 (BP Location: Left Arm, Patient Position: Sitting, Cuff Size: Normal)   Pulse 80   Ht  (1.626 m)   Wt 128 lb 8 oz (58.3 kg)   LMP 02/24/2017 (Exact Date)   BMI 22.06 kg/m    US shows CRL 4.68 mm=6+1 week, which is 1 week less than last [place], and hs SCH 1.3 x.6 x 1 cm, FHR 79 bpm, ovaries were normal. Discussed with pt that this is concerning for miscarriage, will recheck [place] in 5 days.  Assessment:     1. Threatened miscarriage   2. Subchorionic hematoma in first trimester, single or unspecified fetus       Plan:     Note given for school and work to return on 05/01/17 Continue Prometrium Increase fluids No sex or heavy lifting Return in 5 days for F/U US

## 2017-04-26 ENCOUNTER — Telehealth: Payer: Self-pay | Admitting: Adult Health

## 2017-04-26 NOTE — Telephone Encounter (Signed)
Patient called stating she requested a note from [employer] to be out of work until May 2. She is now wanting to work but they will not let her until she received a different note stating she can work. Will get note from [facility] and leave at front for patient to pick up.

## 2017-04-30 ENCOUNTER — Ambulatory Visit (INDEPENDENT_AMBULATORY_CARE_PROVIDER_SITE_OTHER): Payer: BLUE CROSS/BLUE SHIELD | Admitting: Women's Health

## 2017-04-30 ENCOUNTER — Ambulatory Visit (INDEPENDENT_AMBULATORY_CARE_PROVIDER_SITE_OTHER): Payer: BLUE CROSS/BLUE SHIELD

## 2017-04-30 ENCOUNTER — Ambulatory Visit: Payer: BLUE CROSS/BLUE SHIELD | Admitting: *Deleted

## 2017-04-30 ENCOUNTER — Encounter: Payer: Self-pay | Admitting: Women's Health

## 2017-04-30 ENCOUNTER — Other Ambulatory Visit: Payer: Self-pay | Admitting: Adult Health

## 2017-04-30 VITALS — BP 122/70 | HR 106 | Ht 64.0 in | Wt 130.0 lb

## 2017-04-30 DIAGNOSIS — O468X1 Other antepartum hemorrhage, first trimester: Secondary | ICD-10-CM | POA: Diagnosis not present

## 2017-04-30 DIAGNOSIS — O200 Threatened abortion: Secondary | ICD-10-CM

## 2017-04-30 DIAGNOSIS — O021 Missed abortion: Secondary | ICD-10-CM | POA: Diagnosis not present

## 2017-04-30 DIAGNOSIS — O418X10 Other specified disorders of amniotic fluid and membranes, first trimester, not applicable or unspecified: Secondary | ICD-10-CM

## 2017-04-30 NOTE — Patient Instructions (Signed)
Nothing to eat or drink after midnight Be at [facility] Short Stay at 7:15am in the morning

## 2017-04-30 NOTE — Progress Notes (Signed)
   [facility] Visit  Patient name: Carol Bernard MRN [id number]  Date of birth: [birth year]  CC & HPI:  Carol Bernard is a 22 y.o. G26P0030 Caucasian female presenting today initially for new ob visit, however on f/u u/s done today there was no FCA. Denies cramping or bleeding. Recently had D&C in Jan for missed Ab, failed cytotec. Does not want to try meds again. Leaving for [place] next Wed 5/9, wants everything to be done by then. 03/25/17: bhcg 81, progesterone 25.6 04/02/17: bhcg 2,513, progesterone 16.4>started on progesterone 04/09/17: bhcg 13,686 04/15/17: u/s at [redacted]w[redacted]d by LMP w/ GS c/w [redacted]w[redacted]d and CRL c/w [redacted]w[redacted]d and FHR 92 04/25/17: u/s at [redacted]w[redacted]d w/ CRL c/w [redacted]w[redacted]d, and FHR 79 w/ [facility] 04/30/17: u/s @ [redacted]w[redacted]d w/ GS c/w [redacted]w[redacted]d and CRL c/w [redacted]w[redacted]d w/ no FCA Patient's last menstrual period was 02/24/2017 (exact date).  Pertinent History Reviewed:  Medical & Surgical Hx:   Past medical, surgical, family, and social history reviewed in electronic medical record Medications: Reviewed & Updated - see associated section Allergies: Reviewed in electronic medical record  Objective Findings:  Vitals: BP 122/70 (BP Location: Left Arm, Patient Position: Sitting, Cuff Size: Normal)   Pulse (!) 106   Ht  (1.626 m)   Wt 130 lb (59 kg)   LMP 02/24/2017 (Exact Date)   BMI 22.31 kg/m  Body mass index is 22.31 kg/m.  Physical Examination: General appearance - alert, well appearing, and in no distress  No results found for this or any previous visit (from the past 24 hour(s)).   Assessment & Plan:  A:   Missed Ab, CRL c/w 6ws  Mutliple miscarriages  Rh+  P:  Discussed w/ LHE, added to schedule tomorrow for suction D&C- aware of post-op stridor after last D&C  NPO after midnight  Be at [facility] Short Stay @ 0715  Dr. Despina Hidden spoke w/ pt, advised not to get pregnant again until after we can do work-up  Return for 6wks for f/u w/ Dr. Despina Hidden.  Marge Duncans CNM, [facility] 04/30/2017 4:33 PM

## 2017-04-30 NOTE — Progress Notes (Signed)
US TV:6 wks no FHT,crl 36.3 mm,GS 34.1 mm=8+4 wks,normal ovaries bilat,subchorionic hemorrhage 1.3 x 1.4 x 1.2 cm,pt is seeing Kim following ultrasound

## 2017-05-01 ENCOUNTER — Encounter (HOSPITAL_COMMUNITY)
Admission: RE | Disposition: A | Discharge status: Home / Self Care | Payer: Self-pay | Source: Ambulatory Visit | Attending: Obstetrics & Gynecology

## 2017-05-01 ENCOUNTER — Ambulatory Visit (HOSPITAL_COMMUNITY): Payer: BLUE CROSS/BLUE SHIELD | Admitting: Anesthesiology

## 2017-05-01 ENCOUNTER — Ambulatory Visit (HOSPITAL_COMMUNITY)
Admission: RE | Admit: 2017-05-01 | Discharge: 2017-05-01 | Disposition: A | Discharge status: Home / Self Care | Payer: BLUE CROSS/BLUE SHIELD | Source: Ambulatory Visit | Attending: Obstetrics & Gynecology | Admitting: Obstetrics & Gynecology

## 2017-05-01 ENCOUNTER — Encounter (HOSPITAL_COMMUNITY): Payer: Self-pay

## 2017-05-01 ENCOUNTER — Other Ambulatory Visit: Payer: Self-pay | Admitting: Obstetrics & Gynecology

## 2017-05-01 DIAGNOSIS — F419 Anxiety disorder, unspecified: Secondary | ICD-10-CM | POA: Insufficient documentation

## 2017-05-01 DIAGNOSIS — I471 Supraventricular tachycardia: Secondary | ICD-10-CM | POA: Insufficient documentation

## 2017-05-01 DIAGNOSIS — O021 Missed abortion: Secondary | ICD-10-CM | POA: Diagnosis not present

## 2017-05-01 DIAGNOSIS — Z8249 Family history of ischemic heart disease and other diseases of the circulatory system: Secondary | ICD-10-CM | POA: Insufficient documentation

## 2017-05-01 DIAGNOSIS — Z808 Family history of malignant neoplasm of other organs or systems: Secondary | ICD-10-CM | POA: Insufficient documentation

## 2017-05-01 DIAGNOSIS — J45909 Unspecified asthma, uncomplicated: Secondary | ICD-10-CM | POA: Insufficient documentation

## 2017-05-01 DIAGNOSIS — K219 Gastro-esophageal reflux disease without esophagitis: Secondary | ICD-10-CM | POA: Diagnosis not present

## 2017-05-01 DIAGNOSIS — F329 Major depressive disorder, single episode, unspecified: Secondary | ICD-10-CM | POA: Insufficient documentation

## 2017-05-01 HISTORY — PX: DILATION AND CURETTAGE OF UTERUS: SHX78

## 2017-05-01 LAB — CBC
HEMATOCRIT: 36.9 % (ref 36.0–46.0)
HEMOGLOBIN: 12.8 g/dL (ref 12.0–15.0)
MCH: 28.6 pg (ref 26.0–34.0)
MCHC: 34.7 g/dL (ref 30.0–36.0)
MCV: 82.6 fL (ref 78.0–100.0)
Platelets: 166 10*3/uL (ref 150–400)
RBC: 4.47 MIL/uL (ref 3.87–5.11)
RDW: 13.6 % (ref 11.5–15.5)
WBC: 8.4 10*3/uL (ref 4.0–10.5)

## 2017-05-01 LAB — BASIC METABOLIC PANEL
Anion gap: 7 (ref 5–15)
BUN: 9 mg/dL (ref 6–20)
CHLORIDE: 106 mmol/L (ref 101–111)
CO2: 22 mmol/L (ref 22–32)
Calcium: 9.2 mg/dL (ref 8.9–10.3)
Creatinine, Ser: 0.51 mg/dL (ref 0.44–1.00)
GFR calc Af Amer: >60 mL/min (ref >60)
GFR calc non Af Amer: >60 mL/min (ref >60)
GLUCOSE: 85 mg/dL (ref 65–99)
POTASSIUM: 4.2 mmol/L (ref 3.5–5.1)
Sodium: 135 mmol/L (ref 135–145)

## 2017-05-01 SURGERY — DILATION AND CURETTAGE
Anesthesia: General

## 2017-05-01 MED ORDER — SUCCINYLCHOLINE CHLORIDE 20 MG/ML IJ SOLN
INTRAMUSCULAR | Status: DC | PRN
Start: 2017-05-01 — End: 2017-05-01
  Administered 2017-05-01: 100 mg via INTRAVENOUS

## 2017-05-01 MED ORDER — FENTANYL CITRATE (PF) 250 MCG/5ML IJ SOLN
INTRAMUSCULAR | Status: AC
  Filled 2017-05-01: qty 5

## 2017-05-01 MED ORDER — ROCURONIUM BROMIDE 50 MG/5ML IV SOLN
INTRAVENOUS | Status: AC
Start: 2017-05-01 — End: ?
  Filled 2017-05-01: qty 1

## 2017-05-01 MED ORDER — ONDANSETRON HCL 4 MG/2ML IJ SOLN
INTRAMUSCULAR | Status: AC
  Filled 2017-05-01: qty 2

## 2017-05-01 MED ORDER — LACTATED RINGERS IV SOLN
INTRAVENOUS | Status: DC
  Administered 2017-05-01: 1000 mL via INTRAVENOUS

## 2017-05-01 MED ORDER — MIDAZOLAM HCL 2 MG/2ML IJ SOLN
INTRAMUSCULAR | Status: AC
  Filled 2017-05-01: qty 2

## 2017-05-01 MED ORDER — FENTANYL CITRATE (PF) 100 MCG/2ML IJ SOLN
INTRAMUSCULAR | Status: DC | PRN
  Administered 2017-05-01 (×2): 50 ug via INTRAVENOUS

## 2017-05-01 MED ORDER — CEFAZOLIN SODIUM-DEXTROSE 2-4 GM/100ML-% IV SOLN
2.0000 g | INTRAVENOUS | Status: AC
  Administered 2017-05-01: 2 g via INTRAVENOUS
  Filled 2017-05-01: qty 100

## 2017-05-01 MED ORDER — SCOPOLAMINE 1 MG/3DAYS TD PT72
MEDICATED_PATCH | TRANSDERMAL | Status: AC
  Filled 2017-05-01: qty 1

## 2017-05-01 MED ORDER — 0.9 % SODIUM CHLORIDE (POUR BTL) OPTIME
TOPICAL | Status: DC | PRN
  Administered 2017-05-01: 1000 mL

## 2017-05-01 MED ORDER — KETOROLAC TROMETHAMINE 10 MG PO TABS: 10.0000 mg | ORAL_TABLET | Freq: Three times a day (TID) | ORAL | 0 refills | Status: DC | PRN

## 2017-05-01 MED ORDER — IPRATROPIUM-ALBUTEROL 0.5-2.5 (3) MG/3ML IN SOLN
3.0000 mL | Freq: Once | RESPIRATORY_TRACT | Status: AC
Start: 2017-05-01 — End: 2017-05-01
  Administered 2017-05-01: 3 mL via RESPIRATORY_TRACT

## 2017-05-01 MED ORDER — METHYLERGONOVINE MALEATE 0.2 MG/ML IJ SOLN
INTRAMUSCULAR | Status: AC
  Filled 2017-05-01: qty 2

## 2017-05-01 MED ORDER — HYDROMORPHONE HCL 1 MG/ML IJ SOLN
0.2500 mg | INTRAMUSCULAR | Status: DC | PRN
  Administered 2017-05-01 (×2): 0.5 mg via INTRAVENOUS
  Filled 2017-05-01: qty 1

## 2017-05-01 MED ORDER — METHYLERGONOVINE MALEATE 0.2 MG PO TABS: 0.2000 mg | ORAL_TABLET | Freq: Four times a day (QID) | ORAL | 0 refills | Status: DC

## 2017-05-01 MED ORDER — LIDOCAINE HCL (CARDIAC) 10 MG/ML IV SOLN
INTRAVENOUS | Status: DC | PRN
Start: 2017-05-01 — End: 2017-05-01
  Administered 2017-05-01: 50 mg via INTRAVENOUS

## 2017-05-01 MED ORDER — KETOROLAC TROMETHAMINE 30 MG/ML IJ SOLN
30.0000 mg | Freq: Once | INTRAMUSCULAR | Status: AC
  Administered 2017-05-01: 30 mg via INTRAVENOUS
  Filled 2017-05-01: qty 1

## 2017-05-01 MED ORDER — METHYLERGONOVINE MALEATE 0.2 MG/ML IJ SOLN
INTRAMUSCULAR | Status: DC | PRN
  Administered 2017-05-01: 0.2 mg via INTRAMUSCULAR

## 2017-05-01 MED ORDER — DEXAMETHASONE SODIUM PHOSPHATE 4 MG/ML IJ SOLN
4.0000 mg | Freq: Once | INTRAMUSCULAR | Status: AC
  Administered 2017-05-01: 4 mg via INTRAVENOUS

## 2017-05-01 MED ORDER — PROPOFOL 10 MG/ML IV BOLUS
INTRAVENOUS | Status: DC | PRN
  Administered 2017-05-01: 200 mg via INTRAVENOUS
  Administered 2017-05-01: 50 mg via INTRAVENOUS
  Administered 2017-05-01: 20 mg via INTRAVENOUS

## 2017-05-01 MED ORDER — SCOPOLAMINE 1 MG/3DAYS TD PT72
1.0000 | MEDICATED_PATCH | Freq: Once | TRANSDERMAL | Status: DC
  Administered 2017-05-01: 1.5 mg via TRANSDERMAL

## 2017-05-01 MED ORDER — ONDANSETRON 8 MG PO TBDP: 8.0000 mg | ORAL_TABLET | Freq: Three times a day (TID) | ORAL | 0 refills | Status: DC | PRN

## 2017-05-01 MED ORDER — IPRATROPIUM-ALBUTEROL 0.5-2.5 (3) MG/3ML IN SOLN
RESPIRATORY_TRACT | Status: AC
  Filled 2017-05-01: qty 3

## 2017-05-01 MED ORDER — ONDANSETRON HCL 4 MG/2ML IJ SOLN
4.0000 mg | Freq: Once | INTRAMUSCULAR | Status: AC
  Administered 2017-05-01: 4 mg via INTRAVENOUS

## 2017-05-01 MED ORDER — HYDROCODONE-ACETAMINOPHEN 5-325 MG PO TABS: 1.0000 | ORAL_TABLET | Freq: Four times a day (QID) | ORAL | 0 refills | Status: DC | PRN

## 2017-05-01 MED ORDER — SUCCINYLCHOLINE CHLORIDE 20 MG/ML IJ SOLN
INTRAMUSCULAR | Status: AC
  Filled 2017-05-01: qty 1

## 2017-05-01 MED ORDER — PROPOFOL 10 MG/ML IV BOLUS
INTRAVENOUS | Status: AC
  Filled 2017-05-01: qty 20

## 2017-05-01 MED ORDER — DEXAMETHASONE SODIUM PHOSPHATE 4 MG/ML IJ SOLN
INTRAMUSCULAR | Status: AC
  Filled 2017-05-01: qty 1

## 2017-05-01 MED ORDER — MIDAZOLAM HCL 2 MG/2ML IJ SOLN
INTRAMUSCULAR | Status: DC | PRN
  Administered 2017-05-01 (×2): 2 mg via INTRAVENOUS

## 2017-05-01 MED ORDER — SODIUM CHLORIDE 0.9% FLUSH
INTRAVENOUS | Status: AC
  Filled 2017-05-01: qty 10

## 2017-05-01 MED ORDER — LIDOCAINE HCL (PF) 1 % IJ SOLN
INTRAMUSCULAR | Status: AC
  Filled 2017-05-01: qty 5

## 2017-05-01 MED ORDER — ROCURONIUM BROMIDE 100 MG/10ML IV SOLN
INTRAVENOUS | Status: DC | PRN
  Administered 2017-05-01: 5 mg via INTRAVENOUS

## 2017-05-01 MED ORDER — MIDAZOLAM HCL 2 MG/2ML IJ SOLN
1.0000 mg | INTRAMUSCULAR | Status: AC
Start: 2017-05-01 — End: 2017-05-01
  Administered 2017-05-01: 2 mg via INTRAVENOUS

## 2017-05-01 SURGICAL SUPPLY — 31 items
BAG HAMPER (MISCELLANEOUS) ×3 IMPLANT
CLOTH BEACON ORANGE TIMEOUT ST (SAFETY) ×3 IMPLANT
COVER LIGHT HANDLE STERIS (MISCELLANEOUS) ×6 IMPLANT
CURETTE VACUUM 9MM CVD CLR (CANNULA) IMPLANT
DECANTER SPIKE VIAL GLASS SM (MISCELLANEOUS) IMPLANT
FORMALIN 10 PREFIL 480ML (MISCELLANEOUS) ×3 IMPLANT
GAUZE SPONGE 4X4 16PLY XRAY LF (GAUZE/BANDAGES/DRESSINGS) ×3 IMPLANT
GLOVE BIOGEL PI IND STRL 7.0 (GLOVE) ×1 IMPLANT
GLOVE BIOGEL PI IND STRL 8 (GLOVE) ×1 IMPLANT
GLOVE BIOGEL PI INDICATOR 7.0 (GLOVE) ×2
GLOVE BIOGEL PI INDICATOR 8 (GLOVE) ×2
GLOVE ECLIPSE 8.0 STRL XLNG CF (GLOVE) ×3 IMPLANT
GOWN STRL REUS W/TWL LRG LVL3 (GOWN DISPOSABLE) ×6 IMPLANT
GOWN STRL REUS W/TWL XL LVL3 (GOWN DISPOSABLE) ×3 IMPLANT
KIT BERKELEY 1ST TRIMESTER 3/8 (MISCELLANEOUS) ×3 IMPLANT
KIT ROOM TURNOVER AP CYSTO (KITS) ×3 IMPLANT
MANIFOLD NEPTUNE II (INSTRUMENTS) ×3 IMPLANT
MARKER SKIN DUAL TIP RULER LAB (MISCELLANEOUS) ×3 IMPLANT
NS IRRIG 1000ML POUR BTL (IV SOLUTION) ×3 IMPLANT
PACK BASIC III (CUSTOM PROCEDURE TRAY) ×2
PACK SRG BSC III STRL LF ECLPS (CUSTOM PROCEDURE TRAY) ×1 IMPLANT
PAD ARMBOARD 7.5X6 YLW CONV (MISCELLANEOUS) ×3 IMPLANT
SET BASIN LINEN APH (SET/KITS/TRAYS/PACK) ×3 IMPLANT
SET BERKELEY SUCTION TUBING (SUCTIONS) ×3 IMPLANT
SHEET LAVH (DRAPES) ×3 IMPLANT
SYRINGE 10CC LL (SYRINGE) IMPLANT
TOWEL OR 17X26 4PK STRL BLUE (TOWEL DISPOSABLE) IMPLANT
VACURETTE 10MM (CANNULA) IMPLANT
VACURETTE 12MM (CANNULA) IMPLANT
VACURETTE 8 RIGID CVD (CANNULA) ×3 IMPLANT
VACURETTE 8MM (CANNULA) IMPLANT

## 2017-05-01 NOTE — Anesthesia Preprocedure Evaluation (Addendum)
Anesthesia Evaluation  Patient identified by MRN, date of birth, ID band Patient awake    Reviewed: Allergy & Precautions, NPO status , Patient's Chart, lab work & pertinent test results  History of Anesthesia Complications (+) history of anesthetic complications (hx bronchospasm during emergence)  Airway Mallampati: I  TM Distance: >3 FB Neck ROM: Full    Dental  (+) Teeth Intact   Pulmonary asthma ,    breath sounds clear to auscultation       Cardiovascular + dysrhythmias ("palpitations" )  Rhythm:Regular Rate:Normal     Neuro/Psych  Headaches, PSYCHIATRIC DISORDERS Anxiety Depression    GI/Hepatic GERD (inactive now)  Controlled,  Endo/Other    Renal/GU      Musculoskeletal   Abdominal   Peds  Hematology   Anesthesia Other Findings N&V today.  Reproductive/Obstetrics                             Anesthesia Physical Anesthesia Plan  ASA: II  Anesthesia Plan: General   Post-op Pain Management:    Induction: Intravenous, Rapid sequence and Cricoid pressure planned  Airway Management Planned: Oral ETT  Additional Equipment:   Intra-op Plan:   Post-operative Plan: Extubation in OR  Informed Consent: I have reviewed the patients History and Physical, chart, labs and discussed the procedure including the risks, benefits and alternatives for the proposed anesthesia with the patient or authorized representative who has indicated his/her understanding and acceptance.     Plan Discussed with:   Anesthesia Plan Comments:         Anesthesia Quick Evaluation

## 2017-05-01 NOTE — Anesthesia Postprocedure Evaluation (Signed)
Anesthesia Post Note  Patient: Carol Bernard  Procedure(s) Performed: Procedure(s) (LRB): SUCTION DILATATION AND CURETTAGE (N/A)  Patient location during evaluation: PACU Anesthesia Type: General Level of consciousness: awake and alert Pain management: satisfactory to patient Vital Signs Assessment: post-procedure vital signs reviewed and stable Respiratory status: spontaneous breathing Cardiovascular status: stable Anesthetic complications: no     Last Vitals:  Vitals:   05/01/17 1115 05/01/17 1130  BP: 120/77 105/74  Pulse: (!) 113 (!) 101  Resp: 20 (!) 33  Temp:      Last Pain:  Vitals:   05/01/17 0745  TempSrc: Oral                 [name]

## 2017-05-01 NOTE — Anesthesia Procedure Notes (Signed)
Procedure Name: Intubation Date/Time: 05/01/2017 10:01 AM Performed by: Franco Nones Pre-anesthesia Checklist: Patient identified, Patient being monitored, Timeout performed, Emergency Drugs available and Suction available Patient Re-evaluated:Patient Re-evaluated prior to inductionOxygen Delivery Method: Circle System Utilized Preoxygenation: Pre-oxygenation with 100% oxygen Intubation Type: IV induction, Rapid sequence and Cricoid Pressure applied Laryngoscope Size: Miller and 2 Grade View: Grade I Tube type: Oral Tube size: 7.0 mm Number of attempts: 1 Airway Equipment and Method: Stylet and Oral airway Placement Confirmation: ETT inserted through vocal cords under direct vision,  positive ETCO2 and breath sounds checked- equal and bilateral Secured at: 20 cm Tube secured with: Tape Dental Injury: Teeth and Oropharynx as per pre-operative assessment

## 2017-05-01 NOTE — H&P (Signed)
Preoperative History and Physical  Carol Bernard is a 22 y.o. [phone] with Patient's last menstrual period was 02/24/2017 (exact date). admitted for a suction D&C for missed ab in the first trimester.  Serial sonograms revealed yesterday no fetal cardiac activity:A positive Blood type:   PMH:    Past Medical History:  Diagnosis Date  . Anxiety   . Anxiety and depression   . Asthma   . Birth defect   . Depression   . Dysrhythmia    h/o SVT  . GERD (gastroesophageal reflux disease)   . Headache   . Hypoglycemia   . Miscarriage   . Palpitations     PSH:     Past Surgical History:  Procedure Laterality Date  . DILATION AND CURETTAGE OF UTERUS N/A 01/29/2017   Procedure: SUCTION DILATATION AND CURETTAGE;  Surgeon: Tilda Burrow, MD;  Location: [facility];  Service: Gynecology;  Laterality: N/A;  . WISDOM TOOTH EXTRACTION      POb/GynH:      OB History    Gravida Para Term Preterm AB Living   4       3     SAB TAB Ectopic Multiple Live Births   2              SH:   Social History  Substance Use Topics  . Smoking status: Never Smoker  . Smokeless tobacco: Never Used  . Alcohol use No    FH:    Family History  Problem Relation Age of Onset  . Migraines Sister   . Hypertension Maternal Grandmother   . Skin cancer Maternal Grandmother   . Heart disease Maternal Grandfather      Allergies:  Allergies  Allergen Reactions  . Pecan Nut (Diagnostic) Hives  . Reglan [Metoclopramide] Hives    Medications:      No current facility-administered medications for this encounter.   Review of Systems:   Review of Systems  Constitutional: Negative for fever, chills, weight loss, malaise/fatigue and diaphoresis.  HENT: Negative for hearing loss, ear pain, nosebleeds, congestion, sore throat, neck pain, tinnitus and ear discharge.   Eyes: Negative for blurred vision, double vision, photophobia, pain, discharge and redness.  Respiratory: Negative for cough, hemoptysis,  sputum production, shortness of breath, wheezing and stridor.   Cardiovascular: Negative for chest pain, palpitations, orthopnea, claudication, leg swelling and PND.  Gastrointestinal: Positive for abdominal pain. Negative for heartburn, nausea, vomiting, diarrhea, constipation, blood in stool and melena.  Genitourinary: Negative for dysuria, urgency, frequency, hematuria and flank pain.  Musculoskeletal: Negative for myalgias, back pain, joint pain and falls.  Skin: Negative for itching and rash.  Neurological: Negative for dizziness, tingling, tremors, sensory change, speech change, focal weakness, seizures, loss of consciousness, weakness and headaches.  Endo/Heme/Allergies: Negative for environmental allergies and polydipsia. Does not bruise/bleed easily.  Psychiatric/Behavioral: Negative for depression, suicidal ideas, hallucinations, memory loss and substance abuse. The patient is not nervous/anxious and does not have insomnia.      PHYSICAL EXAM:  Last menstrual period 02/24/2017.    Vitals reviewed. Constitutional: She is oriented to person, place, and time. She appears well-developed and well-nourished.  HENT:  Head: Normocephalic and atraumatic.  Right Ear: External ear normal.  Left Ear: External ear normal.  Nose: Nose normal.  Mouth/Throat: Oropharynx is clear and moist.  Eyes: Conjunctivae and EOM are normal. Pupils are equal, round, and reactive to light. Right eye exhibits no discharge. Left eye exhibits no discharge. No scleral icterus.  Neck:  Normal range of motion. Neck supple. No tracheal deviation present. No thyromegaly present.  Cardiovascular: Normal rate, regular rhythm, normal heart sounds and intact distal pulses.  Exam reveals no gallop and no friction rub.   No murmur heard. Respiratory: Effort normal and breath sounds normal. No respiratory distress. She has no wheezes. She has no rales. She exhibits no tenderness.  GI: Soft. Bowel sounds are normal. She  exhibits no distension and no mass. There is tenderness. There is no rebound and no guarding.  Genitourinary:       Vulva is normal without lesions Vagina is pink moist without discharge Cervix normal in appearance and pap is normal Uterus is normal size, contour, position, consistency, mobility, non-tender Adnexa is negative with normal sized ovaries by sonogram  Musculoskeletal: Normal range of motion. She exhibits no edema and no tenderness.  Neurological: She is alert and oriented to person, place, and time. She has normal reflexes. She displays normal reflexes. No cranial nerve deficit. She exhibits normal muscle tone. Coordination normal.  Skin: Skin is warm and dry. No rash noted. No erythema. No pallor.  Psychiatric: She has a normal mood and affect. Her behavior is normal. Judgment and thought content normal.    Labs: No results found for this or any previous visit (from the past 336 hour(s)).  EKG: Orders placed or performed during the hospital encounter of 01/29/17  . EKG 12-Lead  . EKG 12-Lead  . EKG 12-Lead  . EKG 12-Lead    Imaging Studies: US Ob Comp Less 14 Wks  Result Date: 05/01/2017 FOLLOW UP SONOGRAM Carol Bernard is in the office for a follow up sonogram for viability. She is a 22 y.o. year old G95P0030 with Estimated Date of Delivery: 12/10/17 by early ultrasound now at  [redacted]w[redacted]d weeks gestation. Thus far the pregnancy has been complicated by bradycardia. GESTATION: SINGLETON FETAL ACTIVITY:          Heart rate         79 BPM  AMNIOTIC FLUID: The amniotic fluid volume is  normal. CERVIX: Appears closed ADNEXA: The ovaries are normal. GESTATIONAL AGE AND  BIOMETRICS: Gestational criteria: Estimated Date of Delivery: 12/10/17 by early ultrasound now at [redacted]w[redacted]d Previous Scans:1    CROWN RUMP LENGTH           4.68 mm         6+1 weeks                                                                      AVERAGE EGA(BY THIS SCAN):  6+1 weeks                                            SUSPECTED ABNORMALITIES:  yes, discrepancy in CRL,FHR 79 bpm,SCH QUALITY OF SCAN: satisfactory TECHNICIAN COMMENTS: [place] TA/TV:7+2 wks by previous ultrasound,crl 4.68 mm = 6+1 wks,positive FHT 79 bpm (bradycardia),subchorionic hemorrhage 1.3 x  .6 x 1 cm,normal ovaries bilat,pt will see Victorino Dike to discuss results A copy of this report including all images has been saved and backed up to a second source for retrieval if needed. All measures and details  of the anatomical scan, placentation, fluid volume and pelvic anatomy are contained in that report. Amber Flora Lipps 04/25/2017 10:04 AM Clinical Impression and recommendations: I have reviewed the sonogram results above, combined with the patient's current clinical course, below are my impressions and any appropriate recommendations for management based on the sonographic findings. Viable early IUP but with persistent fetal bradycardia and actually heart rate lower today than sonogram 1 week ago, now with subchorionic bleed G4P0030 Estimated Date of Delivery: 12/10/17 Normal general sonographic findings Recommend repeat sonogram within a week, impression is inevitable ab EURE,LUTHER H   US Ob Comp Less 14 Wks  Result Date: 04/18/2017 DATING AND VIABILITY SONOGRAM Carol Bernard is a 22 y.o. year old G71P0030 with LMP 02/24/2017 which would correlate to  7+[redacted] weeks gestation.  She has regular menstrual cycles,w/a history of multiple miscarriages.   She is here today for a confirmatory initial sonogram. GESTATION: SINGLETON   FETAL ACTIVITY:          Heart rate         92 bpm        CERVIX: Appears closed ADNEXA: The ovaries are normal. GESTATIONAL AGE AND  BIOMETRICS: Gestational criteria: Estimated Date of Delivery: 12/01/2017. by LMP now at 7+1 wks Previous Scans:0 GESTATIONAL SAC           15.2 mm         6+2 weeks CROWN RUMP LENGTH           3.36 mm         5+6 weeks                                                                      AVERAGE EGA(BY THIS SCAN):  5+6 weeks  WORKING EDD( early ultrasound ):  12/10/2017  TECHNICIAN COMMENTS: [place] 5+6 wks,single IUP w/ys,pos fht 92 bpm,normal ovaries bilat,crl 3.36 mm,pt will come back for f/u ultrasound in 10 day,per Victorino Dike. A copy of this report including all images has been saved and backed up to a second source for retrieval if needed. All measures and details of the anatomical scan, placentation, fluid volume and pelvic anatomy are contained in that report. Amber Flora Lipps 04/15/2017 1:23 PM Clinical Impression and recommendations: I have reviewed the sonogram results above. Combined with the patient's current clinical course, below are my impressions and any appropriate recommendations for management based on the sonographic findings: 1. Singleton intrauterine pregnancy, with size dates discrepancy resulting in revised EDD of 12/10/2017 based on this ultrasound. Fetal bradycardia noted less than 100 so we will repeat ultrasound in 10 days to confirm viability 2. Generally normal  adnexal structures FERGUSON,JOHN V   US Ob Transvaginal  Result Date: 05/01/2017 FOLLOW UP SONOGRAM Carol Bernard is in the office for a follow up sonogram for viability. She is a 22 y.o. year old G32P0030 with Estimated Date of Delivery: 12/10/17 by early ultrasound now at  [redacted]w[redacted]d weeks gestation. Thus far the pregnancy has been complicated by bradycardia. GESTATION: SINGLETON FETAL ACTIVITY:          Heart rate         No fht       AMNIOTIC FLUID: The amniotic fluid volume is  normal CERVIX: Appears  closed ADNEXA: The ovaries are normal. GESTATIONAL AGE AND  BIOMETRICS: Gestational criteria: Estimated Date of Delivery: 12/10/17 by early ultrasound now at [redacted]w[redacted]d Previous Scans:2 GESTATIONAL SAC           34.1 mm         8+4 weeks CROWN RUMP LENGTH           3.68 mm         6 weeks                                                 AVERAGE EGA(BY THIS SCAN):  6 weeks                                              SUSPECTED ABNORMALITIES:  yes,NO FHT QUALITY OF SCAN:  satisfactory TECHNICIAN COMMENTS: US TV:6 wks no FHT,crl 36.3 mm,GS 34.1 mm=8+4 wks,normal ovaries bilat,subchorionic hemorrhage 1.3 x 1.4 x 1.2 cm,pt is seeing Kim following ultrasound A copy of this report including all images has been saved and backed up to a second source for retrieval if needed. All measures and details of the anatomical scan, placentation, fluid volume and pelvic anatomy are contained in that report. Amber Flora Lipps 04/30/2017 3:08 PM Clinical Impression and recommendations: I have reviewed the sonogram results above, combined with the patient's current clinical course, below are my impressions and any appropriate recommendations for management based on the sonographic findings. No viable early IUP: persistent bradycardia with lower heart rate last week, now with no fetal cardiac activity G4P0030 Estimated Date of Delivery: 12/10/17 Normal general sonographic findings Missed ab in the first trimester EURE,LUTHER H 05/01/2017 7:17 AM   US Ob Transvaginal  Result Date: 05/01/2017 FOLLOW UP SONOGRAM Carol Bernard is in the office for a follow up sonogram for viability. She is a 22 y.o. year old G78P0030 with Estimated Date of Delivery: 12/10/17 by early ultrasound now at  [redacted]w[redacted]d weeks gestation. Thus far the pregnancy has been complicated by bradycardia. GESTATION: SINGLETON FETAL ACTIVITY:          Heart rate         79 BPM  AMNIOTIC FLUID: The amniotic fluid volume is  normal. CERVIX: Appears closed ADNEXA: The ovaries are normal. GESTATIONAL AGE AND  BIOMETRICS: Gestational criteria: Estimated Date of Delivery: 12/10/17 by early ultrasound now at [redacted]w[redacted]d Previous Scans:1    CROWN RUMP LENGTH           4.68 mm         6+1 weeks                                                                      AVERAGE EGA(BY THIS SCAN):  6+1 weeks                                           SUSPECTED ABNORMALITIES:  yes, discrepancy in CRL,FHR 79 bpm,SCH QUALITY OF SCAN: satisfactory TECHNICIAN COMMENTS: [place] TA/TV:7+2 wks  by previous ultrasound,crl 4.68 mm = 6+1 wks,positive FHT 79 bpm (bradycardia),subchorionic hemorrhage 1.3 x  .6 x 1 cm,normal ovaries bilat,pt will see Victorino Dike to discuss results A copy of this report including all images has been saved and backed up to a second source for retrieval if needed. All measures and details of the anatomical scan, placentation, fluid volume and pelvic anatomy are contained in that report. Amber Flora Lipps 04/25/2017 10:04 AM Clinical Impression and recommendations: I have reviewed the sonogram results above, combined with the patient's current clinical course, below are my impressions and any appropriate recommendations for management based on the sonographic findings. Viable early IUP but with persistent fetal bradycardia and actually heart rate lower today than sonogram 1 week ago, now with subchorionic bleed G4P0030 Estimated Date of Delivery: 12/10/17 Normal general sonographic findings Recommend repeat sonogram within a week, impression is inevitable ab EURE,LUTHER H   US Ob Transvaginal  Result Date: 04/18/2017 DATING AND VIABILITY SONOGRAM Carol Bernard is a 22 y.o. year old G59P0030 with LMP 02/24/2017 which would correlate to  7+[redacted] weeks gestation.  She has regular menstrual cycles,w/a history of multiple miscarriages.   She is here today for a confirmatory initial sonogram. GESTATION: SINGLETON   FETAL ACTIVITY:          Heart rate         92 bpm        CERVIX: Appears closed ADNEXA: The ovaries are normal. GESTATIONAL AGE AND  BIOMETRICS: Gestational criteria: Estimated Date of Delivery: 12/01/2017. by LMP now at 7+1 wks Previous Scans:0 GESTATIONAL SAC           15.2 mm         6+2 weeks CROWN RUMP LENGTH           3.36 mm         5+6 weeks                                                                      AVERAGE EGA(BY THIS SCAN):  5+6 weeks WORKING EDD( early ultrasound ):  12/10/2017  TECHNICIAN COMMENTS: [place] 5+6 wks,single IUP w/ys,pos fht 92 bpm,normal ovaries bilat,crl  3.36 mm,pt will come back for f/u ultrasound in 10 day,per Victorino Dike. A copy of this report including all images has been saved and backed up to a second source for retrieval if needed. All measures and details of the anatomical scan, placentation, fluid volume and pelvic anatomy are contained in that report. Amber Flora Lipps 04/15/2017 1:23 PM Clinical Impression and recommendations: I have reviewed the sonogram results above. Combined with the patient's current clinical course, below are my impressions and any appropriate recommendations for management based on the sonographic findings: 1. Singleton intrauterine pregnancy, with size dates discrepancy resulting in revised EDD of 12/10/2017 based on this ultrasound. Fetal bradycardia noted less than 100 so we will repeat ultrasound in 10 days to confirm viability 2. Generally normal  adnexal structures FERGUSON,JOHN V      Assessment: Missed ab in the first trimester Blood type: A positive Patient Active Problem List   Diagnosis Date Noted  . History of multiple miscarriages 03/25/2017  .  Burning with urination 03/25/2017    Plan: Cervical dilation suction sharp uterine curettage  EURE,LUTHER H 05/01/2017 7:18 AM

## 2017-05-01 NOTE — Progress Notes (Signed)
Dr Jayme Cloud at bedside to check pt. No new orders given.

## 2017-05-01 NOTE — Anesthesia Postprocedure Evaluation (Signed)
Anesthesia Post Note  Patient: Carol Bernard  Procedure(s) Performed: Procedure(s) (LRB): SUCTION DILATATION AND CURETTAGE (N/A)  Patient location during evaluation: PACU Anesthesia Type: General Level of consciousness: awake Pain control: 4/10 Medicine given with decrease in pain. Vital Signs Assessment: post-procedure vital signs reviewed and stable Respiratory status: spontaneous breathing Cardiovascular status: stable Anesthetic complications: no     Last Vitals:  Vitals:   05/01/17 1115 05/01/17 1130  BP: 120/77 105/74  Pulse: (!) 113 (!) 101  Resp: 20 (!) 33  Temp:      Last Pain:  Vitals:   05/01/17 0745  TempSrc: Oral                 Carol Bernard

## 2017-05-01 NOTE — Progress Notes (Signed)
Awake. Eyes open. Oriented to place per nurse. resp adequate/nonlabored. o2 continued. Jerky movements of upper extremities. Reassurance given.

## 2017-05-01 NOTE — Transfer of Care (Signed)
Immediate Anesthesia Transfer of Care Note  Patient: Carol Bernard  Procedure(s) Performed: Procedure(s) with comments: SUCTION DILATATION AND CURETTAGE (N/A) - pt to arrive at 7:15 to have labwork  Patient Location: PACU  Anesthesia Type:General  Level of Consciousness: drowsy and patient cooperative  Airway & Oxygen Therapy: Patient Spontanous Breathing and non-rebreather face mask  Post-op Assessment: Report given to RN and Post -op Vital signs reviewed and stable  Post vital signs: Reviewed and stable  Last Vitals:  Vitals:   05/01/17 0940 05/01/17 0945  BP: 111/65   Pulse:    Resp: 20 20  Temp:      Last Pain:  Vitals:   05/01/17 0745  TempSrc: Oral      Patients Stated Pain Goal: 6 (05/01/17 0742)  Complications: No apparent anesthesia complications

## 2017-05-01 NOTE — Progress Notes (Signed)
Pt awake. No jerking of upper extremities. resp adequate/nonlabored. Crying. Talking about present personal situation about miscarriage. Reassurance given. Wants to see boyfriend Landon. States pain med effective.

## 2017-05-01 NOTE — Addendum Note (Signed)
Addendum  created 05/01/17 1147 by Franco Nones, CRNA   Sign clinical note

## 2017-05-01 NOTE — Progress Notes (Signed)
Awake. Pleasant. Crying ceased. Wants to go home. Transferred to postop area via w/c in good condition.

## 2017-05-01 NOTE — Discharge Instructions (Signed)
Recurrent Pregnancy Loss Recurrent pregnancy loss is the loss of three or more pregnancies before 20 weeks of gestation. Losing three or more pregnancies in a row is rare. What are the causes? The most common cause of recurrent pregnancy loss is an abnormal number of chromosomes in the developing baby (fetus). Chromosomes are the structures inside cells that hold all your genetic material. In most cases of recurrent pregnancy loss, a missing or extra chromosome keeps a baby from developing. It may not be possible to identify which chromosome is defective. Chromosome abnormalities can be inherited, but most of the time they occur by chance. Other possible causes of recurrent pregnancy loss include:  Being born with an abnormal womb structure (septate uterus).  Having noncancerous growths in your uterus (fibroids or polyps).  Having a disease that causes scarring in your uterus (Asherman syndrome).  Having a disease that causes your blood to clot (antiphospholipid syndrome).  Having a disease that increases bleeding (thrombophilia). What increases the risk? The risk of recurrent pregnancy loss increases as your age increases. Other risk factors include:  Diabetes.  Thyroid disease.  Obesity.  Smoking.  Excessive alcohol or caffeine.  Recreational drug use. What are the signs or symptoms?  Vaginal bleeding.  Passing clots vaginally.  Abdominal pain or cramps.  Low back pain. How is this diagnosed? Your health care provider can create an image of your womb using sound waves and a computer (ultrasound) to confirm your pregnancy by 5 or 6 weeks after you conceive. Ultrasound can also confirm a pregnancy loss. Your health care provider may do a complete physical exam, including your vagina and uterus (pelvic exam), to find possible causes of recurrent pregnancy loss. Other tests may include:  Ultrasound imaging to see if the structure of your uterus is normal or if you have polyps  or fibroids.  Blood tests to see if you have a disease that causes recurrent pregnancy loss.  Blood tests to see if your blood clots normally.  Genetic testing of you and your partner. How is this treated? In many cases, there is no specific treatment. Depending on the cause, possible treatments include:  Having your eggs fertilized outside your uterus (in vitro fertilization). By doing this, a health care provider may be able to select eggs without chromosome abnormalities.  Taking a blood thinner to prevent clotting if you have antiphospholipid syndrome.  Having corrective surgery if you have an abnormality in your uterus. Follow these instructions at home: Follow all your health care provider's home instructions carefully. These may include:  Do not smoke or use recreational drugs.  Do not drink alcohol if you are pregnant.  Do not put anything in your vagina or have sex for 2 weeks after you have had a miscarriage.  If you are Rh negative and have had a miscarriage, you may need to get a shot of Rho (D) immune globulin. Ask your doctor if you need this shot.  Use birth control if you do not want to get pregnant. You can get pregnant [redacted] weeks after a miscarriage.  Get support from friends and loved ones. Miscarriage can be a sad and stressful event. Contact a health care provider if:  You have light vaginal bleeding or spotting while pregnant.  You have been trying to get pregnant without success.  You are struggling with sadness or depression after a miscarriage. Get help right away if:  You have heavy vaginal bleeding and abdominal cramps.  You have fever, chills, and severe   abdominal pain. This information is not intended to replace advice given to you by your health care provider. Make sure you discuss any questions you have with your health care provider. Document Released: 06/04/2008 Document Revised: 05/24/2016 Document Reviewed: 10/09/2013 Elsevier Interactive  Patient Education  2017 Else[employer] Inc.  

## 2017-05-01 NOTE — Op Note (Signed)
Preoperative diagnosis:  Missed Ab in the first trimester  Postoperative diagnosis:  Same as above  Procedure:  Cervical dilation with suction and sharp uterine curettage  Surgeon:  Lazaro Arms  Anesthesia:  Laryngeal mask airway  Findings:  The patient was known to have a first trimester pregnancy loss by serial sonogram evaluations from the office.  Sonogram yesterday revealed no cardiac activity   As a result she is admitted for a uterine  Evacuation.  Description of operation:  The patient was taken to the operating room and placed in the supine position.  She underwent laryngeal mask airway general anesthesia.  The patient was placed in the dorsal lithotomy position.  The vagina was prepped and draped in the usual sterile fashion.  A Graves speculum was placed.  The anterior cervix was grasped with a single-tooth tenaculum.  The cervix was dilated serially with Hegar dilators.  A #8 curved suction curette was placed in the uterus.  The suction pressure was placed at 52 and several passes were made.  All of the intrauterine contents were removed.  The sharp curette was used x1 to feel uterine crie in all areas.  The patient was given Methergine 0.2 mg IV x1.  There was good hemostasis.  The patient was given Ancef 2 grams preoperatively.  The patient was given Toradol 30 mg IV preoperatively.  Estimated blood loss for the procedure was 400 cc.  The patient was awakened from anesthesia taken to the recovery room in good stable condition.  All counts were correct x3.  EURE,LUTHER H 05/01/2017 10:32 AM

## 2017-05-02 ENCOUNTER — Encounter (HOSPITAL_COMMUNITY): Payer: Self-pay | Admitting: Obstetrics & Gynecology

## 2017-05-13 ENCOUNTER — Ambulatory Visit (INDEPENDENT_AMBULATORY_CARE_PROVIDER_SITE_OTHER): Payer: BLUE CROSS/BLUE SHIELD | Admitting: Obstetrics & Gynecology

## 2017-05-13 ENCOUNTER — Encounter: Payer: Self-pay | Admitting: Obstetrics & Gynecology

## 2017-05-13 VITALS — BP 112/60 | HR 88 | Wt 129.0 lb

## 2017-05-13 DIAGNOSIS — O021 Missed abortion: Secondary | ICD-10-CM

## 2017-05-13 DIAGNOSIS — Z9889 Other specified postprocedural states: Secondary | ICD-10-CM

## 2017-05-13 NOTE — Progress Notes (Signed)
  OZH:[id number]  Patient returns for routine postoperative follow-up having undergone D&C for missed AB on 05/01/2017.  The patient's immediate postoperative recovery has been unremarkable. Since hospital discharge the patient reports no complaints.   Current Outpatient Prescriptions: albuterol (VENTOLIN HFA) 108 (90 Base) MCG/ACT inhaler, Inhale 2 puffs into the lungs See admin instructions. 4 times daily and as needed for shortness of breath and wheezing, Disp: , Rfl:  HYDROcodone-acetaminophen (NORCO/VICODIN) 5-325 MG tablet, Take 1 tablet by mouth every 6 (six) hours as needed for moderate pain., Disp: 20 tablet, Rfl: 0 ketorolac (TORADOL) 10 MG tablet, Take 1 tablet (10 mg total) by mouth every 8 (eight) hours as needed., Disp: 15 tablet, Rfl: 0 methylergonovine (METHERGINE) 0.2 MG tablet, Take 1 tablet (0.2 mg total) by mouth every 6 (six) hours. (Patient not taking: Reported on 05/13/2017), Disp: 6 tablet, Rfl: 0 ondansetron (ZOFRAN ODT) 8 MG disintegrating tablet, Take 1 tablet (8 mg total) by mouth every 8 (eight) hours as needed for nausea or vomiting. (Patient not taking: Reported on 05/13/2017), Disp: 20 tablet, Rfl: 0 Prenatal Multivit-Min-Fe-FA (PRENATAL VITAMINS PO), Take 1 tablet by mouth daily. , Disp: , Rfl:   No current facility-administered medications for this visit.     Blood pressure 112/60, pulse 88, weight 129 lb (58.5 kg), last menstrual period 02/24/2017.  Physical Exam: Abdomen soft normal Bimanual normal  Diagnostic Tests:   Pathology: benign  Impression: s/p  D&C mulitple pregnancy losses(1st pregnancy with 2 different FOB and these last 2 were with the same FOB)  Plan: Follow up 2 months to do thorough work up for multiple early pregnancy losses Pt does not want pills or other hormonal birth control, I have recommended condoms she states she just won't do anything, which I assumed will not work  Follow up: 2  months and check lab for recurrent  pregnancy loss  Lazaro ArmsEURE,LUTHER H, MD

## 2017-06-05 ENCOUNTER — Telehealth: Payer: Self-pay | Admitting: Obstetrics & Gynecology

## 2017-06-05 NOTE — Telephone Encounter (Signed)
Pt called stating that she is having a [facility] and would lie to speak with a nurse regarding that. Pt states that she pulled her underwear down and there was a lot of blood. Please contact pt

## 2017-06-05 NOTE — Telephone Encounter (Signed)
Pt called stating that she had some bleeding occur last wed to fri and that she had had a D&C done 52/01/2017. She stated that it started again on Tuesday and was concerned about this. I advised pt that is was probably her period starting back [facility] and it was probably not regulated yet. I advised pt that if the bleeding continued or increased or if she passed large blood clots to give us a call back. Pt verbalized understanding.

## 2017-06-11 ENCOUNTER — Encounter: Payer: BLUE CROSS/BLUE SHIELD | Admitting: Obstetrics & Gynecology

## 2017-06-28 ENCOUNTER — Ambulatory Visit (INDEPENDENT_AMBULATORY_CARE_PROVIDER_SITE_OTHER): Payer: BLUE CROSS/BLUE SHIELD | Admitting: Obstetrics & Gynecology

## 2017-06-28 ENCOUNTER — Encounter: Payer: Self-pay | Admitting: Obstetrics & Gynecology

## 2017-06-28 VITALS — BP 100/54 | HR 72 | Wt 130.0 lb

## 2017-06-28 DIAGNOSIS — N96 Recurrent pregnancy loss: Secondary | ICD-10-CM | POA: Diagnosis not present

## 2017-06-28 NOTE — Progress Notes (Signed)
Chief Complaint  Patient presents with  . Follow-up    Blood pressure (!) 100/54, pulse 72, weight 130 lb (59 kg), last menstrual period 06/16/2017, unknown if currently breastfeeding.  22 y.o. [id number] Patient's last menstrual period was 06/16/2017 (approximate). The current method of family planning is none.  Outpatient Encounter Prescriptions as of 06/28/2017  Medication Sig  . albuterol (VENTOLIN HFA) 108 (90 Base) MCG/ACT inhaler Inhale 2 puffs into the lungs See admin instructions. 4 times daily and as needed for shortness of breath and wheezing  . [DISCONTINUED] HYDROcodone-acetaminophen (NORCO/VICODIN) 5-325 MG tablet Take 1 tablet by mouth every 6 (six) hours as needed for moderate pain. (Patient not taking: Reported on 06/28/2017)  . [DISCONTINUED] ketorolac (TORADOL) 10 MG tablet Take 1 tablet (10 mg total) by mouth every 8 (eight) hours as needed.  . [DISCONTINUED] methylergonovine (METHERGINE) 0.2 MG tablet Take 1 tablet (0.2 mg total) by mouth every 6 (six) hours. (Patient not taking: Reported on 05/13/2017)  . [DISCONTINUED] ondansetron (ZOFRAN ODT) 8 MG disintegrating tablet Take 1 tablet (8 mg total) by mouth every 8 (eight) hours as needed for nausea or vomiting. (Patient not taking: Reported on 05/13/2017)  . [DISCONTINUED] Prenatal Multivit-Min-Fe-FA (PRENATAL VITAMINS PO) Take 1 tablet by mouth daily.    No facility-administered encounter medications on file as of 06/28/2017.     Subjective Pt and her partner are in today to draw labs for recurrent pregnancy loss I have reviewed 2 very recent review articles regaarding laboratory evaluation and have shared the results with them inn full Pt is without other complaints today and all questions are answered Pt is aware it will take some time for all of the results to trickle in I have recommended she wait 6 months before becoming pregnant again  Objective None today  Pertinent ROS No burning with urination,  frequency or urgency No nausea, vomiting or diarrhea Nor fever chills or other constitutional symptoms   Labs or studies Labs from previous pregnancies reviewed again today    Impression Diagnoses this Encounter::   ICD-10-CM   1. Recurrent pregnancy loss N96 Comprehensive Metabolic Panel (CMET)    TSH    Prolactin    Antinuclear Antib (ANA)    Cardiolipin antibodies, IgG, IgM, IgA    Antiphospholipid Syndrome Diagnostic Panel    Chromosome, Blood, Routine    Antiphospholipid Syndrome Comp    Established relevant diagnosis(es):   Plan/Recommendations: No orders of the defined types were placed in this encounter.   Labs or Scans Ordered: Orders Placed This Encounter  Procedures  . Comprehensive Metabolic Panel (CMET)  . TSH  . Prolactin  . Antinuclear Antib (ANA)  . Cardiolipin antibodies, IgG, IgM, IgA  . Antiphospholipid Syndrome Diagnostic Panel  . Chromosome, Blood, Routine  . Antiphospholipid Syndrome Comp    Management:: Lab evaluation as noted above Will send MyChart messages as the results come in with explanations if needed  Follow up Return if symptoms worsen or fail to improve, for Follow up, with Dr Despina Hidden.        Face to face time:  15 minutes  Greater than 50% of the visit time was spent in counseling and coordination of care with the patient.  The summary and outline of the counseling and care coordination is summarized in the note above.   All questions were answered.  Past Medical History:  Diagnosis Date  . Anxiety   . Anxiety and depression   . Asthma   . Birth  defect   . Cyst of left kidney   . Depression   . Dysrhythmia    h/o SVT  . GERD (gastroesophageal reflux disease)   . Headache   . Hypoglycemia   . Miscarriage   . Palpitations     Past Surgical History:  Procedure Laterality Date  . DILATION AND CURETTAGE OF UTERUS N/A 01/29/2017   Procedure: SUCTION DILATATION AND CURETTAGE;  Surgeon: Tilda BurrowJohn Ferguson V, MD;   Location: [facility];  Service: Gynecology;  Laterality: N/A;  . DILATION AND CURETTAGE OF UTERUS N/A 05/01/2017   Procedure: SUCTION DILATATION AND CURETTAGE;  Surgeon: Lazaro ArmsLuther H Clarise Chacko, MD;  Location: [facility];  Service: Gynecology;  Laterality: N/A;  pt to arrive at 7:15 to have labwork  . WISDOM TOOTH EXTRACTION      OB History as of 06/28/17    Gravida Para Term Preterm AB Living   5       5     SAB TAB Ectopic Multiple Live Births   4              Allergies  Allergen Reactions  . Pecan Nut (Diagnostic) Hives  . Reglan [Metoclopramide] Hives    Social History   Social History  . Marital status: Single    Spouse name: N/A  . Number of children: N/A  . Years of education: N/A   Social History Main Topics  . Smoking status: Never Smoker  . Smokeless tobacco: Never Used  . Alcohol use No  . Drug use: No  . Sexual activity: Yes    Birth control/ protection: None   Other Topics Concern  . None   Social History Narrative  . None    Family History  Problem Relation Age of Onset  . Migraines Sister   . Hypertension Maternal Grandmother   . Skin cancer Maternal Grandmother   . Heart disease Maternal Grandfather

## 2017-07-07 LAB — ANTIPHOSPHOLIPID SYNDROME COMP
ANTIPHOSPHATIDYLSERINE IGG: 1 {GPS'U}
APTT: 28.8 s
Anticardiolipin Ab, IgG: <10 [GPL'U]
Anticardiolipin Ab, IgM: <10 [MPL'U]
Antiphosphatidylserine IgM: 5 {MPS'U}
Antiprothrombin Antibody, IgG: 2 G units
Beta-2 Glycoprotein I, IgA: <10 SAU
Beta-2 Glycoprotein I, IgM: <10 SMU
DRVVT Screen Seconds: 34.1 s
Hexagonal Phospholipid Neutral: 14 s — ABNORMAL HIGH
Platelet Neutralization: 0 s

## 2017-07-11 ENCOUNTER — Telehealth: Payer: Self-pay | Admitting: *Deleted

## 2017-07-11 ENCOUNTER — Encounter: Payer: Self-pay | Admitting: Obstetrics & Gynecology

## 2017-07-12 ENCOUNTER — Ambulatory Visit: Payer: BLUE CROSS/BLUE SHIELD | Admitting: Obstetrics & Gynecology

## 2017-07-18 ENCOUNTER — Telehealth: Payer: Self-pay | Admitting: *Deleted

## 2017-07-18 NOTE — Telephone Encounter (Signed)
I message dher last week I think, there is nothing further at this point, still waiting on the chromosomes, they take a long time, after that comes back I will have her come in for an appointment  Right now nothing new to report

## 2017-07-18 NOTE — Telephone Encounter (Signed)
Informed patient there per Dr Despina HiddenEure he messagedher last week, and there is nothing further at this point, still waiting on the chromosomes, they take a long time, after that comes back I will have her come in for an appointment, right now nothing new to report. Also informed I didn't know how long it would take for the lab to result and Dr Despina HiddenEure would let her know.

## 2017-07-23 ENCOUNTER — Ambulatory Visit (INDEPENDENT_AMBULATORY_CARE_PROVIDER_SITE_OTHER): Payer: Medicaid Other | Admitting: Obstetrics & Gynecology

## 2017-07-23 ENCOUNTER — Encounter: Payer: Self-pay | Admitting: Obstetrics & Gynecology

## 2017-07-23 VITALS — BP 98/54 | HR 85 | Wt 133.0 lb

## 2017-07-23 DIAGNOSIS — Z3201 Encounter for pregnancy test, result positive: Secondary | ICD-10-CM

## 2017-07-23 DIAGNOSIS — N96 Recurrent pregnancy loss: Secondary | ICD-10-CM

## 2017-07-23 DIAGNOSIS — N926 Irregular menstruation, unspecified: Secondary | ICD-10-CM

## 2017-07-23 LAB — POCT URINE PREGNANCY: PREG TEST UR: POSITIVE — AB

## 2017-07-23 MED ORDER — PROGESTERONE MICRONIZED 200 MG PO CAPS: ORAL_CAPSULE | ORAL | 3 refills | Status: DC

## 2017-07-23 MED ORDER — ENOXAPARIN SODIUM 40 MG/0.4ML ~~LOC~~ SOLN: 40.0000 mg | SUBCUTANEOUS | 11 refills | Status: DC

## 2017-07-23 NOTE — Progress Notes (Signed)
Chief Complaint  Patient presents with  . Possible Pregnancy    and f/u on blood work    Blood pressure (!) 98/54, pulse 85, weight 133 lb (60.3 kg), last menstrual period 06/23/2017, unknown if currently breastfeeding.  22 y.o. [id number] Patient's last menstrual period was 06/23/2017 (exact date). The current method of family planning is none.  Outpatient Encounter Prescriptions as of 07/23/2017  Medication Sig  . albuterol (VENTOLIN HFA) 108 (90 Base) MCG/ACT inhaler Inhale 2 puffs into the lungs See admin instructions. 4 times daily and as needed for shortness of breath and wheezing  . enoxaparin (LOVENOX) 40 MG/0.4ML injection Inject 0.4 mLs (40 mg total) into the skin daily.  . progesterone (PROMETRIUM) 200 MG capsule 1 capsule nightly at bedtime   No facility-administered encounter medications on file as of 07/23/2017.     Subjective Hailey is in todayWith an early positive pregnancy test and by early amine like she just missed her menstrual cycle and it took about 10 minutes for the pregnancy test turned positive but of course this is not an early morning urine  She of course does have a history of recurrent pregnancy loss and were sort of in the midst of a workup they only thing that's, positive so far was a mildly elevated Sani coagulant itch would need a follow-up in about 9 more weeks however she's pregnant so we need to go ahead and treat her with Lovenox  I'm also going to add Prometrium 200 mg nightly for progestational support  And we will add TLC therapy to see if this will improve successful pregnancy outcome With that we'll do an hCG and progesterone and 6 days and and base further levels and ultrasounds on these initial levels  She is given a prescription for Lovenox 40 and Prometrium 200 and I'll contact her via my chart next Tuesday when her hCG and progesterone return  Objective   Pertinent ROS No bleeding cramping or pain  Labs or studies Urine  pregnancy test is positive    Impression Diagnoses this Encounter::   ICD-10-CM   1. Recurrent pregnancy loss N96 Beta HCG, Quant    Progesterone  2. Positive pregnancy test Z32.01 POCT urine pregnancy    Established relevant diagnosis(es):   Plan/Recommendations: Meds ordered this encounter  Medications  . progesterone (PROMETRIUM) 200 MG capsule    Sig: 1 capsule nightly at bedtime    Dispense:  30 capsule    Refill:  3  . enoxaparin (LOVENOX) 40 MG/0.4ML injection    Sig: Inject 0.4 mLs (40 mg total) into the skin daily.    Dispense:  30 Syringe    Refill:  11    Labs or Scans Ordered: Orders Placed This Encounter  Procedures  . Beta HCG, Quant  . Progesterone  . POCT urine pregnancy    Management:: Per history of present illness Lovenox and Prometrium support TLC management  Follow up Return if symptoms worsen or fail to improve.   Face to face time:  15 minutes  Greater than 50% of the visit time was spent in counseling and coordination of care with the patient.  The summary and outline of the counseling and care coordination is summarized in the note above.   All questions were answered.  Past Medical History:  Diagnosis Date  . Anxiety   . Anxiety and depression   . Asthma   . Birth defect   . Cyst of left kidney   . Depression   .  Dysrhythmia    h/o SVT  . GERD (gastroesophageal reflux disease)   . Headache   . Hypoglycemia   . Miscarriage   . Palpitations     Past Surgical History:  Procedure Laterality Date  . DILATION AND CURETTAGE OF UTERUS N/A 01/29/2017   Procedure: SUCTION DILATATION AND CURETTAGE;  Surgeon: Tilda Burrow, MD;  Location: [facility];  Service: Gynecology;  Laterality: N/A;  . DILATION AND CURETTAGE OF UTERUS N/A 05/01/2017   Procedure: SUCTION DILATATION AND CURETTAGE;  Surgeon: Lazaro Arms, MD;  Location: [facility];  Service: Gynecology;  Laterality: N/A;  pt to arrive at 7:15 to have labwork  . WISDOM TOOTH  EXTRACTION      OB History    Gravida Para Term Preterm AB Living   5       5     SAB TAB Ectopic Multiple Live Births   4              Allergies  Allergen Reactions  . Pecan Nut (Diagnostic) Hives  . Reglan [Metoclopramide] Hives    Social History   Social History  . Marital status: Single    Spouse name: N/A  . Number of children: N/A  . Years of education: N/A   Social History Main Topics  . Smoking status: Never Smoker  . Smokeless tobacco: Never Used  . Alcohol use No  . Drug use: No  . Sexual activity: Yes    Birth control/ protection: None   Other Topics Concern  . None   Social History Narrative  . None    Family History  Problem Relation Age of Onset  . Migraines Sister   . Hypertension Maternal Grandmother   . Skin cancer Maternal Grandmother   . Heart disease Maternal Grandfather

## 2017-07-24 LAB — CHROMOSOME, BLOOD, ROUTINE
CELLS ANALYZED: 20
CELLS COUNTED: 20
Cells Karyotyped: 2
GTG BAND RESOLUTION ACHIEVED: 500

## 2017-07-24 LAB — COMPREHENSIVE METABOLIC PANEL
A/G RATIO: 1.6 (ref 1.2–2.2)
ALBUMIN: 4.5 g/dL (ref 3.5–5.5)
ALK PHOS: 53 IU/L (ref 39–117)
ALT: 11 IU/L (ref 0–32)
AST: 17 IU/L (ref 0–40)
BILIRUBIN TOTAL: 0.9 mg/dL (ref 0.0–1.2)
BUN / CREAT RATIO: 16 (ref 9–23)
BUN: 10 mg/dL (ref 6–20)
CHLORIDE: 107 mmol/L — AB (ref 96–106)
CO2: 24 mmol/L (ref 20–29)
Calcium: 9.1 mg/dL (ref 8.7–10.2)
Creatinine, Ser: 0.64 mg/dL (ref 0.57–1.00)
GFR calc Af Amer: 149 mL/min/{1.73_m2} (ref >59)
GFR calc non Af Amer: 129 mL/min/{1.73_m2} (ref >59)
GLOBULIN, TOTAL: 2.8 g/dL (ref 1.5–4.5)
Glucose: 90 mg/dL (ref 65–99)
POTASSIUM: 4 mmol/L (ref 3.5–5.2)
SODIUM: 140 mmol/L (ref 134–144)
TOTAL PROTEIN: 7.3 g/dL (ref 6.0–8.5)

## 2017-07-24 LAB — CARDIOLIPIN ANTIBODIES, IGG, IGM, IGA
Anticardiolipin IgA: <9 APL U/mL (ref 0–11)
Anticardiolipin IgM: 9 MPL U/mL (ref 0–12)

## 2017-07-24 LAB — ANA: Anti Nuclear Antibody(ANA): NEGATIVE

## 2017-07-24 LAB — TSH: TSH: 3.88 u[IU]/mL (ref 0.450–4.500)

## 2017-07-24 LAB — PROLACTIN: Prolactin: 12.9 ng/mL (ref 4.8–23.3)

## 2017-07-29 ENCOUNTER — Telehealth: Payer: Self-pay | Admitting: *Deleted

## 2017-07-29 ENCOUNTER — Encounter: Payer: Self-pay | Admitting: Obstetrics & Gynecology

## 2017-07-29 NOTE — Telephone Encounter (Signed)
Patient called stating she has a bright yellow discharge that seems to be a different texture/color than from when she started the progesterone. It does not have an odor, but only a small amount of itching. Patient requesting to be seen for the discharge and leave a urine specimen since she was told by the after hours nurse she needed to be seen.  Informed patient that if she was not having any urinary symptoms, that she did not need to leave a sample. Also informed her that the discharge could be from the prometrium which is normal. Patient still concerned but informed her I would send the message to Dr Despina HiddenEure but didn't think she would need to be evaluated at this time.

## 2017-07-30 LAB — BETA HCG QUANT (REF LAB): HCG QUANT: 865 m[IU]/mL

## 2017-07-30 LAB — PROGESTERONE: PROGESTERONE: 30.1 ng/mL

## 2017-07-31 ENCOUNTER — Encounter: Payer: Self-pay | Admitting: Obstetrics & Gynecology

## 2017-08-02 ENCOUNTER — Encounter: Payer: Self-pay | Admitting: Obstetrics & Gynecology

## 2017-08-02 ENCOUNTER — Other Ambulatory Visit: Payer: Self-pay | Admitting: Obstetrics & Gynecology

## 2017-08-02 DIAGNOSIS — N96 Recurrent pregnancy loss: Secondary | ICD-10-CM

## 2017-08-06 LAB — BETA HCG QUANT (REF LAB): HCG QUANT: 16475 m[IU]/mL

## 2017-08-10 ENCOUNTER — Emergency Department (HOSPITAL_COMMUNITY)
Admission: EM | Admit: 2017-08-10 | Discharge: 2017-08-10 | Disposition: A | Discharge status: Home / Self Care | Payer: Medicaid Other | Attending: Emergency Medicine | Admitting: Emergency Medicine

## 2017-08-10 ENCOUNTER — Encounter (HOSPITAL_COMMUNITY): Payer: Self-pay | Admitting: Emergency Medicine

## 2017-08-10 ENCOUNTER — Emergency Department (HOSPITAL_COMMUNITY): Payer: Medicaid Other

## 2017-08-10 DIAGNOSIS — Z3A01 Less than 8 weeks gestation of pregnancy: Secondary | ICD-10-CM | POA: Diagnosis not present

## 2017-08-10 DIAGNOSIS — J45909 Unspecified asthma, uncomplicated: Secondary | ICD-10-CM | POA: Diagnosis not present

## 2017-08-10 DIAGNOSIS — R1110 Vomiting, unspecified: Secondary | ICD-10-CM | POA: Diagnosis present

## 2017-08-10 DIAGNOSIS — R110 Nausea: Secondary | ICD-10-CM | POA: Diagnosis not present

## 2017-08-10 DIAGNOSIS — Z79899 Other long term (current) drug therapy: Secondary | ICD-10-CM | POA: Insufficient documentation

## 2017-08-10 LAB — URINALYSIS, ROUTINE W REFLEX MICROSCOPIC
BILIRUBIN URINE: NEGATIVE
GLUCOSE, UA: NEGATIVE mg/dL
Hgb urine dipstick: NEGATIVE
KETONES UR: 20 mg/dL — AB
LEUKOCYTES UA: NEGATIVE
Nitrite: NEGATIVE
PH: 8 (ref 5.0–8.0)
PROTEIN: 30 mg/dL — AB
Specific Gravity, Urine: 1.017 (ref 1.005–1.030)

## 2017-08-10 LAB — HCG, QUANTITATIVE, PREGNANCY: HCG, BETA CHAIN, QUANT, S: 78373 m[IU]/mL — AB (ref <5)

## 2017-08-10 MED ORDER — SODIUM CHLORIDE 0.9 % IV BOLUS (SEPSIS)
1000.0000 mL | Freq: Once | INTRAVENOUS | Status: AC
  Administered 2017-08-10: 1000 mL via INTRAVENOUS

## 2017-08-10 MED ORDER — VITAMIN B-6 50 MG PO TABS
25.0000 mg | ORAL_TABLET | Freq: Once | ORAL | Status: AC
  Administered 2017-08-10: 25 mg via ORAL
  Filled 2017-08-10: qty 0.5

## 2017-08-10 MED ORDER — DOXYLAMINE-PYRIDOXINE 10-10 MG PO TBEC: 1.0000 | DELAYED_RELEASE_TABLET | Freq: Every day | ORAL | 0 refills | Status: DC

## 2017-08-10 MED ORDER — VITAMIN B-6 50 MG PO TABS: 50.0000 mg | ORAL_TABLET | Freq: Every day | ORAL | 0 refills | Status: DC

## 2017-08-10 MED ORDER — DIPHENHYDRAMINE HCL 25 MG PO CAPS
25.0000 mg | ORAL_CAPSULE | Freq: Every evening | ORAL | Status: DC | PRN
  Filled 2017-08-10: qty 1

## 2017-08-10 MED ORDER — DOXYLAMINE SUCCINATE (SLEEP) 25 MG PO TABS: 25.0000 mg | ORAL_TABLET | Freq: Once | ORAL | Status: DC

## 2017-08-10 NOTE — ED Provider Notes (Signed)
[facility] Provider Note   CSN: [id number] Arrival date & time: 08/10/17  1012     History   Chief Complaint Chief Complaint  Patient presents with  . Emesis    HPI Carol Bernard is a 22 y.o. female.  Patient is 22 y.o. [id number] [redacted]w[redacted]d by LMP with h/o recurrent pregnancy loss who was undergoing workup prior to this pregnancy who presents to the ED with c/o nausea and vomiting. Woke up this morning with nausea and multiple episode of NBNB emesis, states had had this before in some of her previous pregnancies. No abdominal pain/cramping, vaginal pain, vaginal bleeding or discharge.       Past Medical History:  Diagnosis Date  . Anxiety   . Anxiety and depression   . Asthma   . Birth defect   . Cyst of left kidney   . Depression   . Dysrhythmia    h/o SVT  . GERD (gastroesophageal reflux disease)   . Headache   . Hypoglycemia   . Miscarriage   . Palpitations     Patient Active Problem List   Diagnosis Date Noted  . History of multiple miscarriages 03/25/2017  . Burning with urination 03/25/2017    Past Surgical History:  Procedure Laterality Date  . DILATION AND CURETTAGE OF UTERUS N/A 01/29/2017   Procedure: SUCTION DILATATION AND CURETTAGE;  Surgeon: Tilda Burrow, MD;  Location: [facility];  Service: Gynecology;  Laterality: N/A;  . DILATION AND CURETTAGE OF UTERUS N/A 05/01/2017   Procedure: SUCTION DILATATION AND CURETTAGE;  Surgeon: Lazaro Arms, MD;  Location: [facility];  Service: Gynecology;  Laterality: N/A;  pt to arrive at 7:15 to have labwork  . WISDOM TOOTH EXTRACTION      OB History    Gravida Para Term Preterm AB Living   6       5     SAB TAB Ectopic Multiple Live Births   4               Home Medications    Prior to Admission medications   Medication Sig Start Date End Date Taking? Authorizing Provider  albuterol (VENTOLIN HFA) 108 (90 Base) MCG/ACT inhaler Inhale 2 puffs into the lungs See admin instructions. 4 times daily and as  needed for shortness of breath and wheezing    [provider]  enoxaparin (LOVENOX) 40 MG/0.4ML injection Inject 0.4 mLs (40 mg total) into the skin daily. 07/23/17   Lazaro Arms, MD  progesterone (PROMETRIUM) 200 MG capsule 1 capsule nightly at bedtime 07/23/17   Lazaro Arms, MD    Family History Family History  Problem Relation Age of Onset  . Migraines Sister   . Hypertension Maternal Grandmother   . Skin cancer Maternal Grandmother   . Heart disease Maternal Grandfather     Social History Social History  Substance Use Topics  . Smoking status: Never Smoker  . Smokeless tobacco: Never Used  . Alcohol use No     Allergies   Pecan nut (diagnostic) and Reglan [metoclopramide]   Review of Systems Review of Systems  Constitutional: Negative for chills and fever.  Respiratory: Negative for shortness of breath.   Cardiovascular: Negative for chest pain.  Gastrointestinal: Positive for nausea and vomiting. Negative for abdominal pain, constipation and diarrhea.  Genitourinary: Negative for dysuria, frequency, hematuria and urgency.  Musculoskeletal: Negative for myalgias.  Skin: Negative for rash.  Neurological: Negative for dizziness and weakness.     Physical Exam  Updated Vital Signs BP 123/86 (BP Location: Right Arm)   Pulse 77   Temp 98.1 F (36.7 C) (Oral)   Resp 18   Ht 5\' 3"  (1.6 m)   Wt 58.1 kg (128 lb)   LMP 06/23/2017   SpO2 100%   Breastfeeding? No   BMI 22.67 kg/m   Physical Exam  Constitutional: She is oriented to person, place, and time. She appears well-developed and well-nourished. No distress.  HENT:  Head: Normocephalic and atraumatic.  Nose: Nose normal.  Eyes: Conjunctivae and EOM are normal.  Neck: Normal range of motion. Neck supple.  Cardiovascular: Normal rate, regular rhythm, normal heart sounds and intact distal pulses.   No murmur heard. Pulmonary/Chest: Effort normal and breath sounds normal. No respiratory distress.   Abdominal: Soft. Bowel sounds are normal. She exhibits no distension. There is no tenderness. There is no rebound and no guarding.  Musculoskeletal: Normal range of motion. She exhibits no edema.  Neurological: She is alert and oriented to person, place, and time. She exhibits normal muscle tone.  Skin: Skin is warm and dry. Capillary refill takes less than 2 seconds. No rash noted. She is not diaphoretic.  Psychiatric: She has a normal mood and affect.     ED Treatments / Results  Labs (all labs ordered are listed, but only abnormal results are displayed) Labs Reviewed - No data to display  EKG  EKG Interpretation None       Radiology US Ob Comp Less 14 Wks  Result Date: 08/10/2017 CLINICAL DATA:  Nausea. Quantitative HCG [id number] today (16,475 on 08/05/2017). Estimated gestational age per LMP 6 weeks 6 days. EXAM: OBSTETRIC <14 WK [place] AND TRANSVAGINAL OB US TECHNIQUE: Both transabdominal and transvaginal ultrasound examinations were performed for complete evaluation of the gestation as well as the maternal uterus, adnexal regions, and pelvic cul-de-sac. Transvaginal technique was performed to assess early pregnancy. COMPARISON:  None. FINDINGS: Intrauterine gestational sac: Single visualized. Yolk sac:  Not visualized. Embryo:  Visualized. Cardiac Activity: Visualized. Heart Rate: 117  bpm CRL:  6.6  mm   6 w   4 d                  [place] EDC: 04/01/2018 Subchorionic hemorrhage:  None visualized. Maternal uterus/adnexae: Ovaries within normal with normal color flow. Minimal fluid over the endocervical canal. No free pelvic fluid. IMPRESSION: Single live IUP with estimated gestational age 327 weeks 4 days. Electronically Signed   By: Elberta Fortis M.D.   On: 08/10/2017 12:24   US Ob Transvaginal  Result Date: 08/10/2017 CLINICAL DATA:  Nausea. Quantitative HCG [id number] today (16,475 on 08/05/2017). Estimated gestational age per LMP 6 weeks 6 days. EXAM: OBSTETRIC <14 WK [place] AND TRANSVAGINAL OB US  TECHNIQUE: Both transabdominal and transvaginal ultrasound examinations were performed for complete evaluation of the gestation as well as the maternal uterus, adnexal regions, and pelvic cul-de-sac. Transvaginal technique was performed to assess early pregnancy. COMPARISON:  None. FINDINGS: Intrauterine gestational sac: Single visualized. Yolk sac:  Not visualized. Embryo:  Visualized. Cardiac Activity: Visualized. Heart Rate: 117  bpm CRL:  6.6  mm   6 w   4 d                  [place] EDC: 04/01/2018 Subchorionic hemorrhage:  None visualized. Maternal uterus/adnexae: Ovaries within normal with normal color flow. Minimal fluid over the endocervical canal. No free pelvic fluid. IMPRESSION: Single live IUP with estimated gestational age 327 weeks 4 days.  Electronically Signed   By: Elberta Fortisaniel  Boyle M.D.   On: 08/10/2017 12:24    Procedures Procedures (including critical care time)  Medications Ordered in ED Medications - No data to display   Initial Impression / Assessment and Plan / ED Course  I have reviewed the triage vital signs and the nursing notes.  Pertinent labs & imaging results that were available during my care of the patient were reviewed by me and considered in my medical decision making (see chart for details).   Patient is a G60050 at 4421w5d by LMP who presented with nausea and vomiting. VVS and IUP with good FHR confirmed on ultrasound today. HCG levels rising appropriately. No abd pain/cramping or bleeding so no concern for miscarriage. Given patient's morning sickness, advised trial of vitb6 first with escalation to vitb6 + doxylamine if necessary and follow up with OBGYN as outpatient. Return precautions discussed, patient voiced good understanding.  Final Clinical Impressions(s) / ED Diagnoses   Final diagnoses:  Nausea    New Prescriptions Discharge Medication List as of 08/10/2017  1:20 PM    START taking these medications   Details  Doxylamine-Pyridoxine 10-10 MG TBEC Take 1  tablet by mouth daily., Starting Sat 08/10/2017, Print    pyridOXINE (VITAMIN B-6) 50 MG tablet Take 1 tablet (50 mg total) by mouth daily., Starting Sat 08/10/2017, Print         Leland HerYoo, Mahli Glahn J, DO 08/10/17 1353    Blane OharaZavitz, Joshua, MD 08/10/17 [phone]

## 2017-08-10 NOTE — ED Triage Notes (Addendum)
Pt reports history of several miscarriages. Pt reports is currently [redacted] weeks pregnant and is taking lovenox injections for lupus and reports progesterone vaginally. Pt reports emesis x3 this am. Pt denies any vaginal bleeding or abd pain at this time.

## 2017-08-10 NOTE — Discharge Instructions (Signed)
You were evaluated in the ER for nausea in pregnancy. Ultrasound looked good and measured appropriately. Please make sure to stay well hydrated. For your nausea, try taking vitamin b6 and follow up with your OBGYN.  Safe Medications in Pregnancy   Acne:  Benzoyl Peroxide  Salicylic Acid   Backache/Headache:  Tylenol: 2 regular strength every 4 hours OR               2 Extra strength every 6 hours   Colds/Coughs/Allergies:  Benadryl (alcohol free) 25 mg every 6 hours as needed  Breath right strips  Claritin  Cepacol throat lozenges  Chloraseptic throat spray  Cold-Eeze- up to three times per day  Cough drops, alcohol free  Flonase (by prescription only)  Guaifenesin  Mucinex  Robitussin DM (plain only, alcohol free)  Saline nasal spray/drops  Sudafed (pseudoephedrine) & Actifed * use only after [redacted] weeks gestation and if you do not have high blood pressure  Tylenol  Vicks Vaporub  Zinc lozenges  Zyrtec   Constipation:  Colace  Ducolax suppositories  Fleet enema  Glycerin suppositories  Metamucil  Milk of magnesia  Miralax  Senokot  Smooth move tea   Diarrhea:  Kaopectate  Imodium A-D   *NO pepto Bismol   Hemorrhoids:  Anusol  Anusol HC  Preparation H  Tucks   Indigestion:  Tums  Maalox  Mylanta  Zantac  Pepcid   Insomnia:  Benadryl (alcohol free) 25mg  every 6 hours as needed  Tylenol PM  Unisom, no Gelcaps   Leg Cramps:  Tums  MagGel   Nausea/Vomiting:  Bonine  Dramamine  Emetrol  Ginger extract  Sea bands  Meclizine  Nausea medication to take during pregnancy:  Unisom (doxylamine succinate 25 mg tablets) Take one tablet daily at bedtime. If symptoms are not adequately controlled, the dose can be increased to a maximum recommended dose of two tablets daily (1/2 tablet in the morning, 1/2 tablet mid-afternoon and one at bedtime).  Vitamin B6 100mg  tablets. Take one tablet twice a day (up to 200 mg per day).   Skin Rashes:  Aveeno products   Benadryl cream or 25mg  every 6 hours as needed  Calamine Lotion  1% cortisone cream   Yeast infection:  Gyne-lotrimin 7  Monistat 7    **If taking multiple medications, please check labels to avoid duplicating the same active ingredients  **take medication as directed on the label  ** Do not exceed 4000 mg of tylenol in 24 hours  **Do not take medications that contain aspirin or ibuprofen

## 2017-08-12 ENCOUNTER — Other Ambulatory Visit: Payer: Self-pay | Admitting: Obstetrics & Gynecology

## 2017-08-12 DIAGNOSIS — O3680X0 Pregnancy with inconclusive fetal viability, not applicable or unspecified: Secondary | ICD-10-CM

## 2017-08-15 ENCOUNTER — Ambulatory Visit (INDEPENDENT_AMBULATORY_CARE_PROVIDER_SITE_OTHER): Payer: Medicaid Other | Admitting: Obstetrics & Gynecology

## 2017-08-15 ENCOUNTER — Encounter: Payer: Self-pay | Admitting: Obstetrics & Gynecology

## 2017-08-15 ENCOUNTER — Inpatient Hospital Stay (EMERGENCY_DEPARTMENT_HOSPITAL)
Admission: AD | Admit: 2017-08-15 | Discharge: 2017-08-15 | Disposition: A | Discharge status: Home / Self Care | Payer: Medicaid Other | Source: Ambulatory Visit | Attending: Family Medicine | Admitting: Family Medicine

## 2017-08-15 ENCOUNTER — Other Ambulatory Visit: Payer: Medicaid Other

## 2017-08-15 ENCOUNTER — Encounter (HOSPITAL_COMMUNITY): Payer: Self-pay | Admitting: *Deleted

## 2017-08-15 VITALS — BP 80/60 | HR 80 | Wt 127.4 lb

## 2017-08-15 DIAGNOSIS — N96 Recurrent pregnancy loss: Secondary | ICD-10-CM

## 2017-08-15 DIAGNOSIS — O2621 Pregnancy care for patient with recurrent pregnancy loss, first trimester: Secondary | ICD-10-CM

## 2017-08-15 DIAGNOSIS — O210 Mild hyperemesis gravidarum: Secondary | ICD-10-CM

## 2017-08-15 DIAGNOSIS — Z3A01 Less than 8 weeks gestation of pregnancy: Secondary | ICD-10-CM

## 2017-08-15 DIAGNOSIS — O211 Hyperemesis gravidarum with metabolic disturbance: Secondary | ICD-10-CM

## 2017-08-15 LAB — URINALYSIS, ROUTINE W REFLEX MICROSCOPIC
Bacteria, UA: NONE SEEN
Bilirubin Urine: NEGATIVE
GLUCOSE, UA: NEGATIVE mg/dL
Hgb urine dipstick: NEGATIVE
KETONES UR: 80 mg/dL — AB
Leukocytes, UA: NEGATIVE
NITRITE: NEGATIVE
PH: 5 (ref 5.0–8.0)
Protein, ur: 30 mg/dL — AB
Specific Gravity, Urine: 1.03 (ref 1.005–1.030)

## 2017-08-15 MED ORDER — M.V.I. ADULT IV INJ
Freq: Once | INTRAVENOUS | Status: DC
  Filled 2017-08-15: qty 10

## 2017-08-15 MED ORDER — PROMETHAZINE HCL 12.5 MG RE SUPP: 12.5000 mg | Freq: Four times a day (QID) | RECTAL | 1 refills | Status: DC | PRN

## 2017-08-15 MED ORDER — LACTATED RINGERS IV BOLUS (SEPSIS)
1000.0000 mL | Freq: Once | INTRAVENOUS | Status: AC
  Administered 2017-08-15: 1000 mL via INTRAVENOUS

## 2017-08-15 MED ORDER — SCOPOLAMINE 1 MG/3DAYS TD PT72
1.0000 | MEDICATED_PATCH | TRANSDERMAL | Status: DC
  Administered 2017-08-15: 1.5 mg via TRANSDERMAL
  Filled 2017-08-15: qty 1

## 2017-08-15 MED ORDER — SODIUM CHLORIDE 0.9 % IV SOLN
25.0000 mg | Freq: Once | INTRAVENOUS | Status: AC
  Administered 2017-08-15: 25 mg via INTRAVENOUS
  Filled 2017-08-15: qty 1

## 2017-08-15 NOTE — Discharge Instructions (Signed)

## 2017-08-15 NOTE — MAU Note (Signed)
Pt was unsure about going home still felt a little nauseated and weak. Had Dr. Nira Retortegele talk with pt and scop patch given and pt to f/u on Monday as scheduled with Dr. Despina HiddenEure. Pt to come back if symptoms worsen.

## 2017-08-15 NOTE — Progress Notes (Signed)
Chief Complaint  Patient presents with  . gyn visit    [redacted] week pregnant/ vomiting  Saturday    Blood pressure (!) 80/60, pulse 80, weight 127 lb 6.4 oz (57.8 kg), last menstrual period 06/23/2017.  22 y.o. G6P0050 Patient's last menstrual period was 06/23/2017. The current method of family planning is early pregnancy.  Outpatient Encounter Prescriptions as of 08/15/2017  Medication Sig Note  . Doxylamine-Pyridoxine 10-10 MG TBEC Take 1 tablet by mouth daily.   Marland Kitchen enoxaparin (LOVENOX) 40 MG/0.4ML injection Inject 0.4 mLs (40 mg total) into the skin daily.   . progesterone (PROMETRIUM) 200 MG capsule 1 capsule nightly at bedtime (Patient taking differently: Place 200 mg vaginally at bedtime. 1 capsule nightly at bedtime)   . [DISCONTINUED] Prenatal Vit-Fe Fumarate-FA (MULTIVITAMIN-PRENATAL) 27-0.8 MG TABS tablet Take 1 tablet by mouth daily at 12 noon.   . [DISCONTINUED] pyridOXINE (VITAMIN B-6) 50 MG tablet Take 1 tablet (50 mg total) by mouth daily. 08/15/2017: Pt states med makes her throw up  . albuterol (VENTOLIN HFA) 108 (90 Base) MCG/ACT inhaler Inhale 2 puffs into the lungs See admin instructions. 4 times daily and as needed for shortness of breath and wheezing   . promethazine (PHENERGAN) 12.5 MG suppository Place 1 suppository (12.5 mg total) rectally every 6 (six) hours as needed for nausea or vomiting.    No facility-administered encounter medications on file as of 08/15/2017.     Subjective Pt is early pregnant confirmed by sonogram with severe nausea vomiting, nio spitting No GERD symtoms Can't keep anything down  Objective Abdomen soft benign  Pertinent ROS No burning with urination, frequency or urgency No nausea, vomiting or diarrhea Nor fever chills or other constitutional symptoms   Labs or studies No new    Impression Diagnoses this Encounter::   ICD-10-CM   1. Hyperemesis gravidarum before end of [redacted] week gestation with dehydration O21.1   2.  Recurrent pregnancy loss N96 US OB Transvaginal    Established relevant diagnosis(es): Recurrent pregnancy loss  Plan/Recommendations: Meds ordered this encounter  Medications  . promethazine (PHENERGAN) 12.5 MG suppository    Sig: Place 1 suppository (12.5 mg total) rectally every 6 (six) hours as needed for nausea or vomiting.    Dispense:  24 each    Refill:  1    Labs or Scans Ordered: Orders Placed This Encounter  Procedures  . US OB Transvaginal    Management:: [facility] and she is going to go down for fluids and emesis control  Follow up Return in about 4 days (around 08/19/2017) for vaginal sonogram, Follow up, with Dr Despina Hidden.        Face to face time:  15 minutes  Greater than 50% of the visit time was spent in counseling and coordination of care with the patient.  The summary and outline of the counseling and care coordination is summarized in the note above.   All questions were answered.  Past Medical History:  Diagnosis Date  . Anxiety   . Anxiety and depression   . Asthma   . Birth defect   . Cyst of left kidney   . Depression   . Dysrhythmia    h/o SVT  . GERD (gastroesophageal reflux disease)   . Headache   . Hypoglycemia   . Lupus   . Miscarriage   . Palpitations     Past Surgical History:  Procedure Laterality Date  . DILATION AND CURETTAGE OF UTERUS N/A 01/29/2017  Procedure: SUCTION DILATATION AND CURETTAGE;  Surgeon: Tilda BurrowJohn Ferguson V, MD;  Location: [facility];  Service: Gynecology;  Laterality: N/A;  . DILATION AND CURETTAGE OF UTERUS N/A 05/01/2017   Procedure: SUCTION DILATATION AND CURETTAGE;  Surgeon: Lazaro ArmsLuther H Violetta Lavalle, MD;  Location: [facility];  Service: Gynecology;  Laterality: N/A;  pt to arrive at 7:15 to have labwork  . WISDOM TOOTH EXTRACTION      OB History    Gravida Para Term Preterm AB Living   6 0     5     SAB TAB Ectopic Multiple Live Births   4              Allergies  Allergen Reactions  . Pecan Nut  (Diagnostic) Hives  . Reglan [Metoclopramide] Hives    Social History   Social History  . Marital status: Single    Spouse name: N/A  . Number of children: N/A  . Years of education: N/A   Social History Main Topics  . Smoking status: Never Smoker  . Smokeless tobacco: Never Used  . Alcohol use No  . Drug use: No  . Sexual activity: Yes    Birth control/ protection: None   Other Topics Concern  . None   Social History Narrative  . None    Family History  Problem Relation Age of Onset  . Migraines Sister   . Hypertension Maternal Grandmother   . Skin cancer Maternal Grandmother   . Heart disease Maternal Grandfather

## 2017-08-15 NOTE — MAU Note (Signed)
Pt got up to go to BR and started feeling nauseated and "not feeling good.  Had pt lay back down B/P 97/50.    Pt feeling a little better b/p back up to 100/71

## 2017-08-15 NOTE — MAU Provider Note (Signed)
History     CSN: [id number] Arrival date and time: 08/15/17 1359  First Provider Initiated Contact with Patient 08/15/17 1446      Chief Complaint  Patient presents with  . Emesis    HPI: Carol Bernard is a 22 y.o. [phone] with IUP at [redacted]w[redacted]d who presents to maternity admissions d/t persistent nausea and vomiting. She was seen at [facility] 5 days ago where she received IV fluids and was prescribed vitamin B6. She reports N/V has been unrelieved and she has not been able to keep even sips of fluids down. Reports she was only voided twice in the last 24 hours, unsure when last void was. She was seen earlier today for PNV by Dr. Despina Hidden and sent to MAU for IVFs.   She denies any abnormal vaginal discharge, fevers, chills, sweats, dysuria, nausea, vomiting, other GI or GU symptoms or other general symptoms.  Past obstetric history: OB History  Gravida Para Term Preterm AB Living  6       5    SAB TAB Ectopic Multiple Live Births  4            # Outcome Date GA Lbr Len/2nd Weight Sex Delivery Anes PTL Lv  6 Current           5 SAB           4 SAB           3 Molar           2 SAB           1 SAB               Past Medical History:  Diagnosis Date  . Anxiety   . Anxiety and depression   . Asthma   . Birth defect   . Cyst of left kidney   . Depression   . Dysrhythmia    h/o SVT  . GERD (gastroesophageal reflux disease)   . Headache   . Hypoglycemia   . Miscarriage   . Palpitations     Past Surgical History:  Procedure Laterality Date  . DILATION AND CURETTAGE OF UTERUS N/A 01/29/2017   Procedure: SUCTION DILATATION AND CURETTAGE;  Surgeon: Tilda Burrow, MD;  Location: [facility];  Service: Gynecology;  Laterality: N/A;  . DILATION AND CURETTAGE OF UTERUS N/A 05/01/2017   Procedure: SUCTION DILATATION AND CURETTAGE;  Surgeon: Lazaro Arms, MD;  Location: [facility];  Service: Gynecology;  Laterality: N/A;  pt to arrive at 7:15 to have labwork  . WISDOM TOOTH EXTRACTION       Family History  Problem Relation Age of Onset  . Migraines Sister   . Hypertension Maternal Grandmother   . Skin cancer Maternal Grandmother   . Heart disease Maternal Grandfather     Social History  Substance Use Topics  . Smoking status: Never Smoker  . Smokeless tobacco: Never Used  . Alcohol use No    Allergies:  Allergies  Allergen Reactions  . Pecan Nut (Diagnostic) Hives  . Reglan [Metoclopramide] Hives    Prescriptions Prior to Admission  Medication Sig Dispense Refill Last Dose  . enoxaparin (LOVENOX) 40 MG/0.4ML injection Inject 0.4 mLs (40 mg total) into the skin daily. 30 Syringe 11 08/15/2017 at 1300  . Prenatal Vit-Fe Fumarate-FA (MULTIVITAMIN-PRENATAL) 27-0.8 MG TABS tablet Take 1 tablet by mouth daily at 12 noon.   Past Week at Unknown time  . progesterone (PROMETRIUM) 200 MG capsule 1 capsule  nightly at bedtime (Patient taking differently: Place 200 mg vaginally at bedtime. 1 capsule nightly at bedtime) 30 capsule 3 08/14/2017 at Unknown time  . promethazine (PHENERGAN) 12.5 MG suppository Place 1 suppository (12.5 mg total) rectally every 6 (six) hours as needed for nausea or vomiting. 24 each 1 not filled yet  . pyridOXINE (VITAMIN B-6) 50 MG tablet Take 1 tablet (50 mg total) by mouth daily. 30 tablet 0 08/14/2017 at Unknown time  . albuterol (VENTOLIN HFA) 108 (90 Base) MCG/ACT inhaler Inhale 2 puffs into the lungs See admin instructions. 4 times daily and as needed for shortness of breath and wheezing   prn  . Doxylamine-Pyridoxine 10-10 MG TBEC Take 1 tablet by mouth daily. (Patient not taking: Reported on 08/15/2017) 30 tablet 0 Not Taking at Unknown time    Review of Systems - Negative except for what is mentioned in HPI.  Physical Exam   Blood pressure 128/65, pulse (!) 101, temperature 98.2 F (36.8 C), temperature source Oral, resp. rate 18, height 5\' 3"  (1.6 m), weight 127 lb (57.6 kg), last menstrual period 06/23/2017.  Constitutional:  Well-developed, in NAD, but appears tired  HEENT: mucous membranes are moist Cardiovascular: normal rate, regular rythm Respiratory: normal effort, lungs CTAB.  GI: Abd soft, non-tender MSK: Extremities nontender, no edema, normal ROM Neurologic: Alert and oriented x 4. Psych: Normal mood and affect   MAU Course  Procedures  MDM IVF and IV phenergan given. UA after first 1L of IVFs with 80 ketones, SG 1.030 Additional 1L IVFs given. Passed po trial with fluids, reports felling better.  After d/c placed patient reports feeling dizzy with ambulation. BP  97/50, 105/60 on repeat. Reports still felling nauseous as well. Discussed with Dr. Shawnie PonsPratt. Another liter of IVFs and scopolamine patch ordered. Patient refuses IVFs. Will try scopolamine patch. Pt reports feeling better. Will D/c  Assessment and Plan  22 y.o. [id number] with IUP at 4538w3d presenting with persistent N/V, poor po intake. --D/c home in stable condition --Counseled on sipping on fluids and advancing slowly --Take Vit 6 + doxylamine scheduled --Phenergan prn (has Rx from Dr. Despina HiddenEure) --Strict return precautions given   Future Appointments Date Time Provider Department Center  08/19/2017 9:00 AM [facility] ULTRASOUND [facility] None  08/19/2017 9:30 AM Lazaro ArmsEure, Luther H, MD [facility]     Frederik Pearegele, Vivaan Helseth P, MD 08/15/2017 7:19 PM

## 2017-08-15 NOTE — MAU Note (Addendum)
Pt reports she has been having n/v for several weeks. [facility]  given fluids and B6 for nausea. Still unable to get relief from n/v. Sent from Dr. Despina HiddenEure for fluids. Pt stated she has only urinated twice since yesterday unable to give a sample right now.

## 2017-08-16 ENCOUNTER — Encounter (HOSPITAL_COMMUNITY): Payer: Self-pay

## 2017-08-16 ENCOUNTER — Inpatient Hospital Stay (HOSPITAL_COMMUNITY)
Admission: AD | Admit: 2017-08-16 | Discharge: 2017-08-18 | DRG: 775 | Disposition: A | Discharge status: Home / Self Care | Payer: Medicaid Other | Source: Ambulatory Visit | Attending: Obstetrics and Gynecology | Admitting: Obstetrics and Gynecology

## 2017-08-16 DIAGNOSIS — D6862 Lupus anticoagulant syndrome: Secondary | ICD-10-CM | POA: Diagnosis present

## 2017-08-16 DIAGNOSIS — O210 Mild hyperemesis gravidarum: Principal | ICD-10-CM | POA: Diagnosis present

## 2017-08-16 DIAGNOSIS — O99011 Anemia complicating pregnancy, first trimester: Secondary | ICD-10-CM | POA: Diagnosis present

## 2017-08-16 DIAGNOSIS — Z3A01 Less than 8 weeks gestation of pregnancy: Secondary | ICD-10-CM | POA: Diagnosis not present

## 2017-08-16 DIAGNOSIS — D696 Thrombocytopenia, unspecified: Secondary | ICD-10-CM | POA: Diagnosis present

## 2017-08-16 DIAGNOSIS — O99019 Anemia complicating pregnancy, unspecified trimester: Secondary | ICD-10-CM | POA: Diagnosis present

## 2017-08-16 DIAGNOSIS — O9912 Other diseases of the blood and blood-forming organs and certain disorders involving the immune mechanism complicating childbirth: Secondary | ICD-10-CM | POA: Diagnosis present

## 2017-08-16 DIAGNOSIS — D649 Anemia, unspecified: Secondary | ICD-10-CM | POA: Diagnosis present

## 2017-08-16 DIAGNOSIS — O219 Vomiting of pregnancy, unspecified: Secondary | ICD-10-CM | POA: Diagnosis present

## 2017-08-16 DIAGNOSIS — O2621 Pregnancy care for patient with recurrent pregnancy loss, first trimester: Secondary | ICD-10-CM | POA: Diagnosis present

## 2017-08-16 DIAGNOSIS — O99119 Other diseases of the blood and blood-forming organs and certain disorders involving the immune mechanism complicating pregnancy, unspecified trimester: Secondary | ICD-10-CM

## 2017-08-16 LAB — COMPREHENSIVE METABOLIC PANEL
ALBUMIN: 3.8 g/dL (ref 3.5–5.0)
ALT: 27 U/L (ref 14–54)
AST: 23 U/L (ref 15–41)
Alkaline Phosphatase: 34 U/L — ABNORMAL LOW (ref 38–126)
Anion gap: 11 (ref 5–15)
BILIRUBIN TOTAL: 1.3 mg/dL — AB (ref 0.3–1.2)
BUN: 9 mg/dL (ref 6–20)
CO2: 15 mmol/L — AB (ref 22–32)
Calcium: 8.3 mg/dL — ABNORMAL LOW (ref 8.9–10.3)
Chloride: 109 mmol/L (ref 101–111)
Creatinine, Ser: 0.59 mg/dL (ref 0.44–1.00)
GFR calc Af Amer: >60 mL/min (ref >60)
GFR calc non Af Amer: >60 mL/min (ref >60)
GLUCOSE: 67 mg/dL (ref 65–99)
POTASSIUM: 4.4 mmol/L (ref 3.5–5.1)
SODIUM: 135 mmol/L (ref 135–145)
TOTAL PROTEIN: 6.7 g/dL (ref 6.5–8.1)

## 2017-08-16 LAB — URINALYSIS, ROUTINE W REFLEX MICROSCOPIC
Bilirubin Urine: NEGATIVE
GLUCOSE, UA: NEGATIVE mg/dL
Ketones, ur: 80 mg/dL — AB
NITRITE: NEGATIVE
PH: 5 (ref 5.0–8.0)
Protein, ur: NEGATIVE mg/dL
Specific Gravity, Urine: 1.01 (ref 1.005–1.030)

## 2017-08-16 LAB — CBC
HEMATOCRIT: 31.1 % — AB (ref 36.0–46.0)
Hemoglobin: 10.6 g/dL — ABNORMAL LOW (ref 12.0–15.0)
MCH: 27.5 pg (ref 26.0–34.0)
MCHC: 34.1 g/dL (ref 30.0–36.0)
MCV: 80.8 fL (ref 78.0–100.0)
Platelets: 147 10*3/uL — ABNORMAL LOW (ref 150–400)
RBC: 3.85 MIL/uL — ABNORMAL LOW (ref 3.87–5.11)
RDW: 14.3 % (ref 11.5–15.5)
WBC: 7.7 10*3/uL (ref 4.0–10.5)

## 2017-08-16 LAB — RAPID URINE DRUG SCREEN, HOSP PERFORMED
Amphetamines: NOT DETECTED
Barbiturates: NOT DETECTED
Benzodiazepines: NOT DETECTED
Cocaine: NOT DETECTED
OPIATES: NOT DETECTED
TETRAHYDROCANNABINOL: NOT DETECTED

## 2017-08-16 LAB — TYPE AND SCREEN
ABO/RH(D): A POS
Antibody Screen: NEGATIVE

## 2017-08-16 LAB — ABO/RH: ABO/RH(D): A POS

## 2017-08-16 MED ORDER — LACTATED RINGERS IV BOLUS (SEPSIS)
1000.0000 mL | Freq: Once | INTRAVENOUS | Status: AC
  Administered 2017-08-16: 1000 mL via INTRAVENOUS

## 2017-08-16 MED ORDER — PROGESTERONE MICRONIZED 200 MG PO CAPS
200.0000 mg | ORAL_CAPSULE | Freq: Every day | ORAL | Status: DC
  Administered 2017-08-16 – 2017-08-17 (×2): 200 mg via VAGINAL
  Filled 2017-08-16 (×2): qty 1

## 2017-08-16 MED ORDER — PROMETHAZINE HCL 25 MG/ML IJ SOLN: 25.0000 mg | Freq: Four times a day (QID) | INTRAMUSCULAR | Status: DC

## 2017-08-16 MED ORDER — SCOPOLAMINE 1 MG/3DAYS TD PT72: 1.0000 | MEDICATED_PATCH | TRANSDERMAL | Status: DC

## 2017-08-16 MED ORDER — BOOST / RESOURCE BREEZE PO LIQD
1.0000 | Freq: Three times a day (TID) | ORAL | Status: DC
  Filled 2017-08-16 (×5): qty 1

## 2017-08-16 MED ORDER — PRENATAL MULTIVITAMIN CH: 1.0000 | ORAL_TABLET | Freq: Every day | ORAL | Status: DC

## 2017-08-16 MED ORDER — THIAMINE HCL 100 MG/ML IJ SOLN
Freq: Once | INTRAVENOUS | Status: AC
  Administered 2017-08-16: 19:00:00 via INTRAVENOUS
  Filled 2017-08-16: qty 1000

## 2017-08-16 MED ORDER — CALCIUM CARBONATE ANTACID 500 MG PO CHEW: 2.0000 | CHEWABLE_TABLET | ORAL | Status: DC | PRN

## 2017-08-16 MED ORDER — PANTOPRAZOLE SODIUM 40 MG IV SOLR
40.0000 mg | INTRAVENOUS | Status: DC
  Administered 2017-08-16 – 2017-08-17 (×2): 40 mg via INTRAVENOUS
  Filled 2017-08-16 (×2): qty 40

## 2017-08-16 MED ORDER — ENOXAPARIN SODIUM 40 MG/0.4ML ~~LOC~~ SOLN
40.0000 mg | SUBCUTANEOUS | Status: DC
  Administered 2017-08-16 – 2017-08-17 (×2): 40 mg via SUBCUTANEOUS
  Filled 2017-08-16 (×2): qty 0.4

## 2017-08-16 MED ORDER — PYRIDOXINE HCL 100 MG/ML IJ SOLN: 100.0000 mg | Freq: Every day | INTRAMUSCULAR | Status: DC

## 2017-08-16 MED ORDER — SCOPOLAMINE 1 MG/3DAYS TD PT72
1.0000 | MEDICATED_PATCH | TRANSDERMAL | Status: DC
Start: 2017-08-16 — End: 2017-08-16
  Filled 2017-08-16: qty 1

## 2017-08-16 MED ORDER — ACETAMINOPHEN 325 MG PO TABS: 650.0000 mg | ORAL_TABLET | ORAL | Status: DC | PRN

## 2017-08-16 MED ORDER — PYRIDOXINE HCL 100 MG/ML IJ SOLN
100.0000 mg | Freq: Every day | INTRAMUSCULAR | Status: DC
  Administered 2017-08-16 – 2017-08-18 (×3): 100 mg via INTRAVENOUS
  Filled 2017-08-16 (×3): qty 1

## 2017-08-16 MED ORDER — SODIUM CHLORIDE 0.9 % IV SOLN: 25.0000 mg | INTRAVENOUS | Status: DC

## 2017-08-16 MED ORDER — SODIUM CHLORIDE 0.9 % IV SOLN
8.0000 mg | Freq: Two times a day (BID) | INTRAVENOUS | Status: DC | PRN
  Administered 2017-08-16: 8 mg via INTRAVENOUS
  Filled 2017-08-16 (×2): qty 4

## 2017-08-16 MED ORDER — ALBUTEROL SULFATE (2.5 MG/3ML) 0.083% IN NEBU: 3.0000 mL | INHALATION_SOLUTION | Freq: Four times a day (QID) | RESPIRATORY_TRACT | Status: DC | PRN

## 2017-08-16 MED ORDER — SCOPOLAMINE 1 MG/3DAYS TD PT72
1.0000 | MEDICATED_PATCH | TRANSDERMAL | Status: DC
  Administered 2017-08-16: 1.5 mg via TRANSDERMAL
  Filled 2017-08-16 (×2): qty 1

## 2017-08-16 MED ORDER — ONDANSETRON HCL 4 MG/2ML IJ SOLN: 4.0000 mg | Freq: Four times a day (QID) | INTRAMUSCULAR | Status: DC | PRN

## 2017-08-16 MED ORDER — PROMETHAZINE HCL 25 MG/ML IJ SOLN
25.0000 mg | Freq: Four times a day (QID) | INTRAMUSCULAR | Status: DC
  Administered 2017-08-16 – 2017-08-18 (×7): 25 mg via INTRAVENOUS
  Filled 2017-08-16 (×7): qty 1

## 2017-08-16 MED ORDER — DOCUSATE SODIUM 100 MG PO CAPS: 100.0000 mg | ORAL_CAPSULE | Freq: Every day | ORAL | Status: DC

## 2017-08-16 MED ORDER — DIPHENHYDRAMINE HCL 25 MG PO CAPS
25.0000 mg | ORAL_CAPSULE | Freq: Three times a day (TID) | ORAL | Status: DC | PRN
  Administered 2017-08-16: 25 mg via ORAL
  Filled 2017-08-16: qty 1

## 2017-08-16 MED ORDER — SODIUM CHLORIDE 0.9 % IV SOLN
25.0000 mg | Freq: Once | INTRAVENOUS | Status: AC
  Administered 2017-08-16: 25 mg via INTRAVENOUS
  Filled 2017-08-16: qty 1

## 2017-08-16 NOTE — Progress Notes (Signed)
Antepartum Update Note  Patient states feeling a little better. D/w her goals and plan of care.   Carol Copa MD Attending Center for [employer] (Faculty Practice) 08/16/2017 Time: 2326

## 2017-08-16 NOTE — H&P (Signed)
Carol Bernard is a 22 y.o. female G6P0050 at [redacted]w[redacted]d presenting for treatment of persistent hyperemesis. Patient was seen at [facility] approximately 1 week ago with nausea/vomiting. She was treated with IV fluid and vitamin B6. Patient was seen on 8/16 for her first prenatal visit with [facility] and due to persistent nausea/vomiting and inability to keep oral intake she was sent to MAU. Patient received 2 bags of IV fluid and offered admission as she was unable to tolerate sips of ginger ale. Patient declined admission, hoping she would feel better once home. She was not able to tolerate sips of water all day and thus was not able to take her prescribed phernergan and diclegis. Patient denies any cramping or vaginal bleeding. She has a history of 5 miscarriages and a recent diagnosis of LUPUS. Patient is currently on lovenox for prophylaxis.    OB History    Gravida Para Term Preterm AB Living   6       5     SAB TAB Ectopic Multiple Live Births   4             Past Medical History:  Diagnosis Date  . Anxiety   . Anxiety and depression   . Asthma   . Birth defect   . Cyst of left kidney   . Depression   . Dysrhythmia    h/o SVT  . GERD (gastroesophageal reflux disease)   . Headache   . Hypoglycemia   . Miscarriage   . Palpitations    Past Surgical History:  Procedure Laterality Date  . DILATION AND CURETTAGE OF UTERUS N/A 01/29/2017   Procedure: SUCTION DILATATION AND CURETTAGE;  Surgeon: Tilda Burrow, MD;  Location: [facility];  Service: Gynecology;  Laterality: N/A;  . DILATION AND CURETTAGE OF UTERUS N/A 05/01/2017   Procedure: SUCTION DILATATION AND CURETTAGE;  Surgeon: Lazaro Arms, MD;  Location: [facility];  Service: Gynecology;  Laterality: N/A;  pt to arrive at 7:15 to have labwork  . WISDOM TOOTH EXTRACTION     Family History: family history includes Heart disease in her maternal grandfather; Hypertension in her maternal grandmother; Migraines in her sister; Skin cancer in her  maternal grandmother. Social History:  reports that she has never smoked. She has never used smokeless tobacco. She reports that she does not drink alcohol or use drugs.   ROS  See pertinent in HPI History   Blood pressure 111/60, pulse (!) 103, temperature 98 F (36.7 C), resp. rate 18, height 5\' 3"  (1.6 m), weight 128 lb (58.1 kg), last menstrual period 06/23/2017. Exam Physical Exam  GENERAL: Well-developed, well-nourished female. Anxious  NECK: Supple. Normal thyroid.  LUNGS: Clear to auscultation bilaterally.  HEART: Regular rate and rhythm. ABDOMEN: Soft, nontender, nondistended.  PELVIC: Not indicated. EXTREMITIES: No cyanosis, clubbing, or edema, 2+ distal pulses.   Prenatal labs: ABO, Rh: A/Positive/-- (01/08 1400) Antibody:   Rubella:   RPR: Non Reactive (01/30 1047)  HBsAg:    HIV:    GBS:     Results for orders placed or performed during the hospital encounter of 08/15/17 (from the past 48 hour(s))  Urinalysis, Routine w reflex microscopic     Status: Abnormal   Collection Time: 08/15/17  2:23 PM  Result Value Ref Range   Color, Urine YELLOW YELLOW   APPearance HAZY (A) CLEAR   Specific Gravity, Urine 1.030 1.005 - 1.030   pH 5.0 5.0 - 8.0   Glucose, UA NEGATIVE NEGATIVE mg/dL  Hgb urine dipstick NEGATIVE NEGATIVE   Bilirubin Urine NEGATIVE NEGATIVE   Ketones, ur 80 (A) NEGATIVE mg/dL   Protein, ur 30 (A) NEGATIVE mg/dL   Nitrite NEGATIVE NEGATIVE   Leukocytes, UA NEGATIVE NEGATIVE   RBC / HPF 0-5 0 - 5 RBC/hpf   WBC, UA 0-5 0 - 5 WBC/hpf   Bacteria, UA NONE SEEN NONE SEEN   Squamous Epithelial / LPF 6-30 (A) NONE SEEN   Mucous PRESENT       Assessment/Plan: 22 yo G6P0050 at [redacted]w[redacted]d with hyperemesis gravidarum  - Admit for IV hydration - Phenergan in IV fluid - Zofran IV prn - Continue lovenox for prophylaxis - Will check TSH and bmp to rule out hypokalemia - Advance diet as tolerated  Carol Bernard 08/16/2017, 3:37 PM

## 2017-08-16 NOTE — MAU Note (Signed)
Pt presents to MAU with complaints of nausea and vomiting worsening. PT was evaluated in MAU yesterday with the same symptoms. Denies any VB or abnormal discharge

## 2017-08-16 NOTE — MAU Note (Signed)
Report given to [employer] and Abby RN receiving patient.

## 2017-08-17 DIAGNOSIS — O99011 Anemia complicating pregnancy, first trimester: Secondary | ICD-10-CM

## 2017-08-17 DIAGNOSIS — O219 Vomiting of pregnancy, unspecified: Secondary | ICD-10-CM

## 2017-08-17 DIAGNOSIS — O99019 Anemia complicating pregnancy, unspecified trimester: Secondary | ICD-10-CM | POA: Diagnosis present

## 2017-08-17 DIAGNOSIS — D696 Thrombocytopenia, unspecified: Secondary | ICD-10-CM | POA: Diagnosis present

## 2017-08-17 DIAGNOSIS — O99119 Other diseases of the blood and blood-forming organs and certain disorders involving the immune mechanism complicating pregnancy, unspecified trimester: Secondary | ICD-10-CM

## 2017-08-17 LAB — IRON AND TIBC
IRON: 93 ug/dL (ref 28–170)
SATURATION RATIOS: 32 % — AB (ref 10.4–31.8)
TIBC: 294 ug/dL (ref 250–450)
UIBC: 201 ug/dL

## 2017-08-17 LAB — VITAMIN B12: VITAMIN B 12: 543 pg/mL (ref 180–914)

## 2017-08-17 LAB — RETICULOCYTES
RBC.: 3.85 MIL/uL — ABNORMAL LOW (ref 3.87–5.11)
Retic Count, Absolute: 57.8 10*3/uL (ref 19.0–186.0)
Retic Ct Pct: 1.5 % (ref 0.4–3.1)

## 2017-08-17 LAB — FERRITIN: Ferritin: 25 ng/mL (ref 11–307)

## 2017-08-17 LAB — FOLATE: FOLATE: 22.6 ng/mL (ref >5.9)

## 2017-08-17 MED ORDER — ENSURE ENLIVE PO LIQD
237.0000 mL | Freq: Two times a day (BID) | ORAL | Status: DC
  Administered 2017-08-17 – 2017-08-18 (×2): 237 mL via ORAL
  Filled 2017-08-17 (×3): qty 237

## 2017-08-17 NOTE — Progress Notes (Signed)
Patient tolerated chicken salad sandwich, jello, and ginger ale for dinner.  No nausea or vomiting.

## 2017-08-17 NOTE — Progress Notes (Addendum)
Daily Antepartum Note  Admission Date: 08/16/2017 Current Date: 08/17/2017 7:34 AM  Carol Bernard is a 22 y.o. G6P0050 @ [redacted]w[redacted]d , HD#2, admitted for n/v of pregnancy.  Pregnancy complicated by: Recurrent pregnancy loss ?lupus Anemia ?gestational thrombocytopenia  Overnight/24hr events:  Slept o/n. Ate some clears and soft foods and threw up small amount  Subjective:  No n/v.   Objective:    Current Vital Signs 24h Vital Sign Ranges  T 98.8 F (37.1 C) Temp  Avg: 98.6 F (37 C)  Min: 98 F (36.7 C)  Max: 99 F (37.2 C)  BP 101/63 BP  Min: 91/46  Max: 111/60  HR 81 Pulse  Avg: 81  Min: 73  Max: 103  RR 18 Resp  Avg: 17.5  Min: 16  Max: 18  SaO2 100 % Not Delivered SpO2  Avg: 100 %  Min: 100 %  Max: 100 %       24 Hour I/O Current Shift I/O  Time Ins Outs No intake/output data recorded. No intake/output data recorded.  Weight 8/17: 128 lbs, 7/24: 133 lbs, 6/29: 130 lbs, 04/2017: 129 lbs Physical exam: General: Well nourished, well developed female in no acute distress. Abdomen: gravid nttp Extremities: no clubbing, cyanosis or edema Skin: Warm and dry.   Medications: Current Facility-Administered Medications  Medication Dose Route Frequency Provider Last Rate Last Dose  . acetaminophen (TYLENOL) tablet 650 mg  650 mg Oral Q4H PRN Constant, Peggy, MD      . albuterol (PROVENTIL) (2.5 MG/3ML) 0.083% nebulizer solution 3 mL  3 mL Inhalation QID PRN Constant, Peggy, MD      . diphenhydrAMINE (BENADRYL) capsule 25 mg  25 mg Oral Q8H PRN [place] Bing, MD   25 mg at 08/16/17 2342  . enoxaparin (LOVENOX) injection 40 mg  40 mg Subcutaneous Q24H Constant, Peggy, MD   40 mg at 08/16/17 1709  . feeding supplement (BOOST / RESOURCE BREEZE) liquid 1 Container  1 Container Oral TID BM [place] Bing, MD      . ondansetron (ZOFRAN) 8 mg in sodium chloride 0.9 % 50 mL IVPB  8 mg Intravenous Q12H PRN [place] Bing, MD   Stopped at 08/16/17 1950  . pantoprazole (PROTONIX) injection  40 mg  40 mg Intravenous Q24H [place] Bing, MD   40 mg at 08/16/17 1800  . progesterone (PROMETRIUM) capsule 200 mg  200 mg Vaginal QHS Constant, Peggy, MD   200 mg at 08/16/17 2206  . promethazine (PHENERGAN) injection 25 mg  25 mg Intravenous Q6H [place] Bing, MD   25 mg at 08/17/17 0342  . pyridOXINE (B-6) injection 100 mg  100 mg Intravenous Daily Constant, Peggy, MD   100 mg at 08/16/17 1526  . scopolamine (TRANSDERM-SCOP) 1 MG/3DAYS 1.5 mg  1 patch Transdermal Q72H [place] Bing, MD   1.5 mg at 08/16/17 2126    Labs:   Recent Labs Lab 08/16/17 1600  WBC 7.7  HGB 10.6*  HCT 31.1*  PLT 147*     Recent Labs Lab 08/16/17 1600  NA 135  K 4.4  CL 109  CO2 15*  BUN 9  CREATININE 0.59  CALCIUM 8.3*  PROT 6.7  BILITOT 1.3*  ALKPHOS 34*  ALT 27  AST 23  GLUCOSE 67   Results for Carol, Bernard (MRN [id number]) as of 08/17/2017 07:35  Ref. Range 08/16/2017 19:25  URINALYSIS, ROUTINE W REFLEX MICROSCOPIC Unknown Rpt (A)  Appearance Latest Ref Range: CLEAR  TURBID (A)  Bacteria, UA Latest  Ref Range: NONE SEEN  MANY (A)  Bilirubin Urine Latest Ref Range: NEGATIVE  NEGATIVE  Color, Urine Latest Ref Range: YELLOW  YELLOW  Glucose Latest Ref Range: NEGATIVE mg/dL NEGATIVE  Hgb urine dipstick Latest Ref Range: NEGATIVE  SMALL (A)  Ketones, ur Latest Ref Range: NEGATIVE mg/dL 80 (A)  Leukocytes, UA Latest Ref Range: NEGATIVE  TRACE (A)  Mucous Unknown PRESENT  Nitrite Latest Ref Range: NEGATIVE  NEGATIVE  pH Latest Ref Range: 5.0 - 8.0  5.0  Protein Latest Ref Range: NEGATIVE mg/dL NEGATIVE  RBC / HPF Latest Ref Range: 0 - 5 RBC/hpf 6-30  Specific Gravity, Urine Latest Ref Range: 1.005 - 1.030  1.010  Squamous Epithelial / LPF Latest Ref Range: NONE SEEN  TOO NUMEROUS TO C... (A)  WBC, UA Latest Ref Range: 0 - 5 WBC/hpf 6-30    Negative: UDS  Pending: UCx  Radiology: no new imaging Prior IUP confirmed last week  Assessment & Plan:  Pt doing well *Pregnancy:  no current issues *n/v of pregnancy: continue current regimen. Consider nutrition consult if not continuing to improve. If improving can consider transitioning to PO meds later today -s/p banana bag x 1 *?lupus: on ppx lovenox already *RPL: continue progesterone support *anemia: panel added on to labs *?gest thrombocytopenia: rpt cbc with nt or quad screen.  *PPx: lovenox, oob ad lib *FEN/GI: will finish 1L bag of crystalloid and then can SLIV *Dispo: see above likely monday  Cornelia Copa. MD Attending Center for [facility])

## 2017-08-18 LAB — URINE CULTURE

## 2017-08-18 MED ORDER — PROGESTERONE MICRONIZED 200 MG PO CAPS: 200.0000 mg | ORAL_CAPSULE | Freq: Every day | ORAL | 6 refills | Status: DC

## 2017-08-18 MED ORDER — PROMETHAZINE HCL 25 MG PO TABS: 25.0000 mg | ORAL_TABLET | Freq: Four times a day (QID) | ORAL | 6 refills | Status: DC

## 2017-08-18 NOTE — Progress Notes (Signed)
Discharged home in stable condition via w/c. 

## 2017-08-18 NOTE — Discharge Instructions (Signed)
Eating Plan for Hyperemesis Gravidarum °Hyperemesis gravidarum is a severe form of morning sickness. Because this condition causes severe nausea and vomiting, it can lead to dehydration, malnutrition, and weight loss. One way to lessen the symptoms of nausea and vomiting is to follow the eating plan for hyperemesis gravidarum. It is often used along with prescribed medicines to control your symptoms. °What can I do to relieve my symptoms? °Listen to your body. Everyone is different and has different preferences. Find what works best for you. Take any of the following actions that are helpful to you: °· Eat and drink slowly. °· Eat 5-6 small meals daily instead of 3 large meals. °· Eat crackers before you get out of bed in the morning. °· Try having a snack in the middle of the night. °· Starchy foods are usually tolerated well. Examples include cereal, toast, bread, potatoes, pasta, rice, and pretzels. °· Ginger may help with nausea. Add ¼ tsp ground ginger to hot tea or choose ginger tea. °· Try drinking 100% fruit juice or an electrolyte drink. An electrolyte drink contains sodium, potassium, and chloride. °· Continue to take your prenatal vitamins as told by your health care provider. If you are having trouble taking your prenatal vitamins, talk with your health care provider about different options. °· Include at least 1 serving of protein with your meals and snacks. Protein options include meats or poultry, beans, nuts, eggs, and yogurt. Try eating a protein-rich snack before bed. Examples of these snacks include cheese and crackers or half of a peanut butter or turkey sandwich. °· Consider eliminating foods that trigger your symptoms. These may include spicy foods, coffee, high-fat foods, very sweet foods, and acidic foods. °· Try meals that have more protein combined with bland, salty, lower-fat, and dry foods, such as nuts, seeds, pretzels, crackers, and cereal. °· Talk with your healthcare provider about  starting a supplement of vitamin B6. °· Have fluids that are cold, clear, and carbonated or sour. Examples include lemonade, ginger ale, lemon-lime soda, ice water, and sparkling water. °· Try lemon or mint tea. °· Try brushing your teeth or using a mouth rinse after meals. ° °What should I avoid to reduce my symptoms? °Avoiding some of the following things may help reduce your symptoms. °· Foods with strong smells. Try eating meals in well-ventilated areas that are free of odors. °· Drinking water or other beverages with meals. Try not to drink anything during the 30 minutes before and after your meals. °· Drinking more than 1 cup of fluid at a time. Sometimes using a straw helps. °· Fried or high-fat foods, such as butter and cream sauces. °· Spicy foods. °· Skipping meals as best as you can. Nausea can be more intense on an empty stomach. If you cannot tolerate food at that time, do not force it. Try sucking on ice chips or other frozen items, and make up for missed calories later. °· Lying down within 2 hours after eating. °· Environmental triggers. These may include smoky rooms, closed spaces, rooms with strong smells, warm or humid places, overly loud and noisy rooms, and rooms with motion or flickering lights. °· Quick and sudden changes in your movement. ° °This information is not intended to replace advice given to you by your health care provider. Make sure you discuss any questions you have with your health care provider. °Document Released: 10/14/2007 Document Revised: 08/15/2016 Document Reviewed: 07/17/2016 °Elsevier Interactive Patient Education © 2018 [employer] ° °

## 2017-08-18 NOTE — Progress Notes (Signed)
Discharge instructions given to patient and she verbalized understanding of all instructions. Written copy of AVS given to patient. 

## 2017-08-18 NOTE — Discharge Summary (Signed)
Obstetric Discharge Summary Reason for Admission: Hyperemesis gravidarum   Hemoglobin  Date Value Ref Range Status  08/16/2017 10.6 (L) 12.0 - 15.0 g/dL Final  83/33/8329 19.1 11.1 - 15.9 g/dL Final   HCT  Date Value Ref Range Status  08/16/2017 31.1 (L) 36.0 - 46.0 % Final   Hematocrit  Date Value Ref Range Status  01/07/2017 37.1 34.0 - 46.6 % Final   Patient admitted secondary to persistent nausea/vomiting in pregnancy at [redacted]w[redacted]d following multiple ED visits for hydration. During her hospitalization, she received IV fluids and antiemetics on schedule. She was able to tolerate both liquid and solid food. Patient was found stable for discharge on HD#2. Discharge instructions reviewed Patient also instructed to continue lovenox and prometrium  Physical Exam:  General: alert, cooperative and no distress Abdomen: soft, nontender, non distended DVT Evaluation: No evidence of DVT seen on physical exam. Negative Homan's sign.  Discharge Diagnoses: Hyperemesis graviderum  Discharge Information: Date: 08/18/2017 Activity: unrestricted Diet: as tolerated Medications: phenergan/diclegis Condition: stable Discharge to: home Follow-up Information    [facility] up.   Why:  As scheduled for continued prenatal care Contact information: [address] [place] [address] [phone]           Carol Bernard 08/18/2017, 8:03 AM

## 2017-08-18 NOTE — Progress Notes (Signed)
Patient complained of intermittent burning when urinating.

## 2017-08-19 ENCOUNTER — Other Ambulatory Visit: Payer: Self-pay | Admitting: Obstetrics & Gynecology

## 2017-08-19 ENCOUNTER — Ambulatory Visit (INDEPENDENT_AMBULATORY_CARE_PROVIDER_SITE_OTHER): Payer: Medicaid Other

## 2017-08-19 ENCOUNTER — Encounter: Payer: Self-pay | Admitting: Obstetrics & Gynecology

## 2017-08-19 ENCOUNTER — Ambulatory Visit (INDEPENDENT_AMBULATORY_CARE_PROVIDER_SITE_OTHER): Payer: Medicaid Other | Admitting: Obstetrics & Gynecology

## 2017-08-19 VITALS — BP 100/58 | HR 75 | Ht 63.0 in | Wt 130.0 lb

## 2017-08-19 DIAGNOSIS — O3680X0 Pregnancy with inconclusive fetal viability, not applicable or unspecified: Secondary | ICD-10-CM

## 2017-08-19 DIAGNOSIS — Z1389 Encounter for screening for other disorder: Secondary | ICD-10-CM | POA: Diagnosis not present

## 2017-08-19 DIAGNOSIS — O30031 Twin pregnancy, monochorionic/diamniotic, first trimester: Secondary | ICD-10-CM

## 2017-08-19 DIAGNOSIS — O30009 Twin pregnancy, unspecified number of placenta and unspecified number of amniotic sacs, unspecified trimester: Secondary | ICD-10-CM

## 2017-08-19 DIAGNOSIS — O2621 Pregnancy care for patient with recurrent pregnancy loss, first trimester: Secondary | ICD-10-CM

## 2017-08-19 DIAGNOSIS — O210 Mild hyperemesis gravidarum: Secondary | ICD-10-CM

## 2017-08-19 DIAGNOSIS — Z3A08 8 weeks gestation of pregnancy: Secondary | ICD-10-CM | POA: Diagnosis not present

## 2017-08-19 DIAGNOSIS — N96 Recurrent pregnancy loss: Secondary | ICD-10-CM

## 2017-08-19 DIAGNOSIS — Z3A01 Less than 8 weeks gestation of pregnancy: Secondary | ICD-10-CM | POA: Diagnosis not present

## 2017-08-19 DIAGNOSIS — Z331 Pregnant state, incidental: Secondary | ICD-10-CM

## 2017-08-19 DIAGNOSIS — Z3689 Encounter for other specified antenatal screening: Secondary | ICD-10-CM

## 2017-08-19 LAB — POCT URINALYSIS DIPSTICK
Blood, UA: NEGATIVE
GLUCOSE UA: NEGATIVE
KETONES UA: NEGATIVE
LEUKOCYTES UA: NEGATIVE
Nitrite, UA: NEGATIVE
PROTEIN UA: NEGATIVE

## 2017-08-19 NOTE — Progress Notes (Signed)
[place] MO/DI TWINS,normal ovaries bilat BABY A: 7 wks,IUP w/ys, crl  9.53 mm,pos fht 135 bpm BABY B: 7 + 3 wks,IUP w/ys,crl 12.16 mm,pos fht 152 bpm,EDD 04/04/2018

## 2017-08-19 NOTE — Progress Notes (Signed)
[id number] [redacted]w[redacted]d Estimated Date of Delivery: 03/31/18  Blood pressure (!) 100/58, pulse 75, height 5\' 3"  (1.6 m), weight 130 lb (59 kg), last menstrual period 06/23/2017.   BP weight and urine results all reviewed and noted.  Please refer to the obstetrical flow sheet for the fundal height and fetal heart rate documentation:  Patient reports good fetal movement, denies any bleeding and no rupture of membranes symptoms or regular contractions. Patient is without complaints. All questions were answered.  Orders Placed This Encounter  Procedures  . POCT urinalysis dipstick    Plan:  Continued routine obstetrical care,   Twins, probably mono di but will know for sure at 10-12 weeks  Hyperemesis Grvidarum exacerbated by twins  Return in about 1 week (around 08/26/2017) for Follow up, with Dr Despina Hidden.

## 2017-08-23 ENCOUNTER — Other Ambulatory Visit: Payer: Self-pay | Admitting: *Deleted

## 2017-08-23 ENCOUNTER — Telehealth: Payer: Self-pay | Admitting: Obstetrics & Gynecology

## 2017-08-23 NOTE — Telephone Encounter (Signed)
Informed patient Lovenox has 11 refills. Stated she has not called pharmacy and did not know. Advised to call pharmacy for refill. Verbalized understanding.

## 2017-08-26 ENCOUNTER — Ambulatory Visit (INDEPENDENT_AMBULATORY_CARE_PROVIDER_SITE_OTHER): Payer: Medicaid Other | Admitting: Obstetrics & Gynecology

## 2017-08-26 ENCOUNTER — Encounter: Payer: Self-pay | Admitting: Obstetrics & Gynecology

## 2017-08-26 VITALS — BP 100/60 | HR 107 | Ht 63.0 in | Wt 125.5 lb

## 2017-08-26 DIAGNOSIS — N96 Recurrent pregnancy loss: Secondary | ICD-10-CM

## 2017-08-26 DIAGNOSIS — O2621 Pregnancy care for patient with recurrent pregnancy loss, first trimester: Secondary | ICD-10-CM | POA: Diagnosis not present

## 2017-08-26 DIAGNOSIS — O30009 Twin pregnancy, unspecified number of placenta and unspecified number of amniotic sacs, unspecified trimester: Secondary | ICD-10-CM

## 2017-08-26 DIAGNOSIS — O30001 Twin pregnancy, unspecified number of placenta and unspecified number of amniotic sacs, first trimester: Secondary | ICD-10-CM

## 2017-08-26 DIAGNOSIS — O210 Mild hyperemesis gravidarum: Secondary | ICD-10-CM | POA: Diagnosis not present

## 2017-08-26 DIAGNOSIS — Z1389 Encounter for screening for other disorder: Secondary | ICD-10-CM | POA: Diagnosis not present

## 2017-08-26 DIAGNOSIS — Z3A08 8 weeks gestation of pregnancy: Secondary | ICD-10-CM

## 2017-08-26 DIAGNOSIS — Z331 Pregnant state, incidental: Secondary | ICD-10-CM | POA: Diagnosis not present

## 2017-08-26 LAB — POCT URINALYSIS DIPSTICK
Blood, UA: NEGATIVE
Glucose, UA: NEGATIVE
NITRITE UA: NEGATIVE

## 2017-08-26 NOTE — Progress Notes (Signed)
Chief Complaint  Patient presents with  . Follow-up    vomiting continues    Blood pressure 100/60, pulse (!) 107, height 5\' 3"  (1.6 m), weight 125 lb 8 oz (56.9 kg), last menstrual period 06/23/2017.  22 y.o. G6P0050 Patient's last menstrual period was 06/23/2017. The current method of family planning is none.  Outpatient Encounter Prescriptions as of 08/26/2017  Medication Sig Note  . albuterol (VENTOLIN HFA) 108 (90 Base) MCG/ACT inhaler Inhale 2 puffs into the lungs See admin instructions. 4 times daily and as needed for shortness of breath and wheezing   . Doxylamine-Pyridoxine 10-10 MG TBEC Take 1 tablet by mouth daily. 08/15/2017: Makes pt throw up  . enoxaparin (LOVENOX) 40 MG/0.4ML injection Inject 0.4 mLs (40 mg total) into the skin daily.   . Pediatric Multiple Vit-C-FA (FLINSTONES GUMMIES OMEGA-3 DHA PO) Take by mouth. Takes 2 daily   . progesterone (PROMETRIUM) 200 MG capsule 1 capsule nightly at bedtime (Patient taking differently: Place 200 mg vaginally at bedtime. 1 capsule nightly at bedtime)   . promethazine (PHENERGAN) 12.5 MG suppository Place 1 suppository (12.5 mg total) rectally every 6 (six) hours as needed for nausea or vomiting. 08/15/2017: Pt has not picked rx up from pharmacy  . promethazine (PHENERGAN) 25 MG tablet Take 1 tablet (25 mg total) by mouth every 6 (six) hours.   . [DISCONTINUED] Prenatal Vit-Fe Fumarate-FA (PRENATAL MULTIVITAMIN) TABS tablet Take 1 tablet by mouth daily at 12 noon.   . [DISCONTINUED] progesterone (PROMETRIUM) 200 MG capsule Place 1 capsule (200 mg total) vaginally at bedtime.    No facility-administered encounter medications on file as of 08/26/2017.     Subjective [redacted]w[redacted]d twins(questionable mono/di) with hyperemesis gravidarum, no ptylism yet Taking the phenergan suppositories every 6 hours No bleeding thowing up 2-3 times per day, so far has suffered 7 lbs of weight loss Sonogram next week   Objective   Pertinent  ROS Per HPI  Labs or studies None new    Impression Diagnoses this Encounter::   ICD-10-CM   1. Recurrent pregnancy loss N96 US OB Transvaginal   Continue TLC therapy, weekly visits, every other week sonogram, [elvic rest, no heavy work or exercise  2. Twin pregnancy, antepartum, unspecified multiple gestation type O30.009 US OB Transvaginal  3. Screening for genitourinary condition Z13.89 POCT Urinalysis Dipstick  4. Pregnant state, incidental Z33.1 POCT Urinalysis Dipstick    Established relevant diagnosis(es):   Plan/Recommendations: Meds ordered this encounter  Medications  . Pediatric Multiple Vit-C-FA (FLINSTONES GUMMIES OMEGA-3 DHA PO)    Sig: Take by mouth. Takes 2 daily    Labs or Scans Ordered: Orders Placed This Encounter  Procedures  . US OB Transvaginal  . POCT Urinalysis Dipstick    Management:: Sonogram and visit next week  Follow up Return in about 1 week (around 09/02/2017) for vaginal sonogram, Follow up, with Dr Despina Hidden.        Face to face time:  15 minutes  Greater than 50% of the visit time was spent in counseling and coordination of care with the patient.  The summary and outline of the counseling and care coordination is summarized in the note above.   All questions were answered.  Past Medical History:  Diagnosis Date  . Anxiety   . Anxiety and depression   . Asthma   . Birth defect   . Cyst of left kidney   . Depression   . Dysrhythmia    h/o SVT  .  GERD (gastroesophageal reflux disease)   . Headache   . Hypoglycemia   . Lupus   . Miscarriage   . Palpitations     Past Surgical History:  Procedure Laterality Date  . DILATION AND CURETTAGE OF UTERUS N/A 01/29/2017   Procedure: SUCTION DILATATION AND CURETTAGE;  Surgeon: Tilda Burrow, MD;  Location: [facility];  Service: Gynecology;  Laterality: N/A;  . DILATION AND CURETTAGE OF UTERUS N/A 05/01/2017   Procedure: SUCTION DILATATION AND CURETTAGE;  Surgeon: Lazaro Arms, MD;   Location: [facility];  Service: Gynecology;  Laterality: N/A;  pt to arrive at 7:15 to have labwork  . WISDOM TOOTH EXTRACTION      OB History    Gravida Para Term Preterm AB Living   6       5     SAB TAB Ectopic Multiple Live Births   4              Allergies  Allergen Reactions  . Pecan Nut (Diagnostic) Hives  . Reglan [Metoclopramide] Hives    Social History   Social History  . Marital status: Single    Spouse name: N/A  . Number of children: N/A  . Years of education: N/A   Social History Main Topics  . Smoking status: Never Smoker  . Smokeless tobacco: Never Used  . Alcohol use No  . Drug use: No  . Sexual activity: Yes    Birth control/ protection: None   Other Topics Concern  . None   Social History Narrative  . None    Family History  Problem Relation Age of Onset  . Migraines Sister   . Hypertension Maternal Grandmother   . Skin cancer Maternal Grandmother   . Heart disease Maternal Grandfather

## 2017-08-29 ENCOUNTER — Inpatient Hospital Stay (HOSPITAL_COMMUNITY)
Admission: AD | Admit: 2017-08-29 | Discharge: 2017-08-29 | Disposition: A | Discharge status: Home / Self Care | Payer: Medicaid Other | Source: Ambulatory Visit | Attending: Obstetrics & Gynecology | Admitting: Obstetrics & Gynecology

## 2017-08-29 ENCOUNTER — Encounter (HOSPITAL_COMMUNITY): Payer: Self-pay

## 2017-08-29 DIAGNOSIS — O219 Vomiting of pregnancy, unspecified: Secondary | ICD-10-CM

## 2017-08-29 DIAGNOSIS — O26891 Other specified pregnancy related conditions, first trimester: Secondary | ICD-10-CM | POA: Diagnosis not present

## 2017-08-29 DIAGNOSIS — Z3A00 Weeks of gestation of pregnancy not specified: Secondary | ICD-10-CM | POA: Insufficient documentation

## 2017-08-29 DIAGNOSIS — R109 Unspecified abdominal pain: Secondary | ICD-10-CM | POA: Insufficient documentation

## 2017-08-29 DIAGNOSIS — O30001 Twin pregnancy, unspecified number of placenta and unspecified number of amniotic sacs, first trimester: Secondary | ICD-10-CM | POA: Insufficient documentation

## 2017-08-29 LAB — URINALYSIS, ROUTINE W REFLEX MICROSCOPIC
BILIRUBIN URINE: NEGATIVE
Glucose, UA: NEGATIVE mg/dL
HGB URINE DIPSTICK: NEGATIVE
KETONES UR: NEGATIVE mg/dL
Leukocytes, UA: NEGATIVE
NITRITE: NEGATIVE
PH: 7 (ref 5.0–8.0)
Protein, ur: NEGATIVE mg/dL
SPECIFIC GRAVITY, URINE: 1.023 (ref 1.005–1.030)

## 2017-08-29 NOTE — MAU Provider Note (Signed)
Chief Complaint: Emesis   First Provider Initiated Contact with Patient 08/29/17 1523      SUBJECTIVE HPI: Carol Bernard is a 22 y.o. G6P0050 at 7558w6d by LMP who presents to maternity admissions reporting ongoing n/v of pregnancy with onset of lower abdominal cramping today. She reports using phenergan suppositories for nausea which are helping. She has hx of miscarriage x 5 so was worried when the cramping started.  She denies any bleeding or other associated symptoms. She denies vaginal itching/burning, urinary symptoms, h/a, dizziness, or fever/chills.     HPI  Past Medical History:  Diagnosis Date  . Anxiety   . Anxiety and depression   . Asthma   . Birth defect   . Cyst of left kidney   . Depression   . Dysrhythmia    h/o SVT  . GERD (gastroesophageal reflux disease)   . Headache   . Hypoglycemia   . Lupus   . Miscarriage   . Palpitations    Past Surgical History:  Procedure Laterality Date  . DILATION AND CURETTAGE OF UTERUS N/A 01/29/2017   Procedure: SUCTION DILATATION AND CURETTAGE;  Surgeon: Tilda BurrowJohn Ferguson V, MD;  Location: [facility];  Service: Gynecology;  Laterality: N/A;  . DILATION AND CURETTAGE OF UTERUS N/A 05/01/2017   Procedure: SUCTION DILATATION AND CURETTAGE;  Surgeon: Lazaro ArmsLuther H Eure, MD;  Location: [facility];  Service: Gynecology;  Laterality: N/A;  pt to arrive at 7:15 to have labwork  . WISDOM TOOTH EXTRACTION     Social History   Social History  . Marital status: Single    Spouse name: N/A  . Number of children: N/A  . Years of education: N/A   Occupational History  . Not on file.   Social History Main Topics  . Smoking status: Never Smoker  . Smokeless tobacco: Never Used  . Alcohol use No  . Drug use: No  . Sexual activity: Yes    Birth control/ protection: None   Other Topics Concern  . Not on file   Social History Narrative  . No narrative on file   No current facility-administered medications on file prior to encounter.    Current  Outpatient Prescriptions on File Prior to Encounter  Medication Sig Dispense Refill  . Doxylamine-Pyridoxine 10-10 MG TBEC Take 1 tablet by mouth daily. 30 tablet 0  . enoxaparin (LOVENOX) 40 MG/0.4ML injection Inject 0.4 mLs (40 mg total) into the skin daily. 30 Syringe 11  . progesterone (PROMETRIUM) 200 MG capsule 1 capsule nightly at bedtime (Patient taking differently: Place 200 mg vaginally at bedtime. 1 capsule nightly at bedtime) 30 capsule 3  . promethazine (PHENERGAN) 12.5 MG suppository Place 1 suppository (12.5 mg total) rectally every 6 (six) hours as needed for nausea or vomiting. 24 each 1  . albuterol (VENTOLIN HFA) 108 (90 Base) MCG/ACT inhaler Inhale 2 puffs into the lungs See admin instructions. 4 times daily and as needed for shortness of breath and wheezing     Allergies  Allergen Reactions  . Pecan Nut (Diagnostic) Hives  . Reglan [Metoclopramide] Hives    ROS:  Review of Systems  Constitutional: Negative for chills, fatigue and fever.  Respiratory: Negative for shortness of breath.   Cardiovascular: Negative for chest pain.  Gastrointestinal: Positive for abdominal pain, nausea and vomiting.  Genitourinary: Positive for pelvic pain. Negative for difficulty urinating, dysuria, flank pain, vaginal bleeding, vaginal discharge and vaginal pain.  Neurological: Negative for dizziness and headaches.  Psychiatric/Behavioral: Negative.  I have reviewed patient's Past Medical Hx, Surgical Hx, Family Hx, Social Hx, medications and allergies.   Physical Exam   Patient Vitals for the past 24 hrs:  BP Temp Pulse Resp  08/29/17 1427 114/78 97.7 F (36.5 C) 95 18   Constitutional: Well-developed, well-nourished female in no acute distress.  Cardiovascular: normal rate Respiratory: normal effort GI: Abd soft, non-tender. Pos BS x 4 MS: Extremities nontender, no edema, normal ROM Neurologic: Alert and oriented x 4.  GU: Neg CVAT.  PELVIC EXAM: Deferred   LAB  RESULTS Results for orders placed or performed during the hospital encounter of 08/29/17 (from the past 24 hour(s))  Urinalysis, Routine w reflex microscopic     Status: Abnormal   Collection Time: 08/29/17  2:25 PM  Result Value Ref Range   Color, Urine YELLOW YELLOW   APPearance CLOUDY (A) CLEAR   Specific Gravity, Urine 1.023 1.005 - 1.030   pH 7.0 5.0 - 8.0   Glucose, UA NEGATIVE NEGATIVE mg/dL   Hgb urine dipstick NEGATIVE NEGATIVE   Bilirubin Urine NEGATIVE NEGATIVE   Ketones, ur NEGATIVE NEGATIVE mg/dL   Protein, ur NEGATIVE NEGATIVE mg/dL   Nitrite NEGATIVE NEGATIVE   Leukocytes, UA NEGATIVE NEGATIVE    --/--/A POS, A POS (08/17 1605)  IMAGING  Bedside US today shows IUP x 2 with FHR 169 for Baby A and 179 for Baby B, amniotic fluid visually normal, no abnormalities visualized  US Ob Comp Less 14 Wks  Result Date: 08/10/2017 CLINICAL DATA:  Nausea. Quantitative HCG [id number] today (16,475 on 08/05/2017). Estimated gestational age per LMP 6 weeks 6 days. EXAM: OBSTETRIC <14 WK [place] AND TRANSVAGINAL OB US TECHNIQUE: Both transabdominal and transvaginal ultrasound examinations were performed for complete evaluation of the gestation as well as the maternal uterus, adnexal regions, and pelvic cul-de-sac. Transvaginal technique was performed to assess early pregnancy. COMPARISON:  None. FINDINGS: Intrauterine gestational sac: Single visualized. Yolk sac:  Not visualized. Embryo:  Visualized. Cardiac Activity: Visualized. Heart Rate: 117  bpm CRL:  6.6  mm   6 w   4 d                  [place] EDC: 04/01/2018 Subchorionic hemorrhage:  None visualized. Maternal uterus/adnexae: Ovaries within normal with normal color flow. Minimal fluid over the endocervical canal. No free pelvic fluid. IMPRESSION: Single live IUP with estimated gestational age 147 weeks 4 days. Electronically Signed   By: Elberta Fortis M.D.   On: 08/10/2017 12:24   US Ob Transvaginal  Result Date: 08/10/2017 CLINICAL DATA:  Nausea.  Quantitative HCG [id number] today (16,475 on 08/05/2017). Estimated gestational age per LMP 6 weeks 6 days. EXAM: OBSTETRIC <14 WK [place] AND TRANSVAGINAL OB US TECHNIQUE: Both transabdominal and transvaginal ultrasound examinations were performed for complete evaluation of the gestation as well as the maternal uterus, adnexal regions, and pelvic cul-de-sac. Transvaginal technique was performed to assess early pregnancy. COMPARISON:  None. FINDINGS: Intrauterine gestational sac: Single visualized. Yolk sac:  Not visualized. Embryo:  Visualized. Cardiac Activity: Visualized. Heart Rate: 117  bpm CRL:  6.6  mm   6 w   4 d                  [place] EDC: 04/01/2018 Subchorionic hemorrhage:  None visualized. Maternal uterus/adnexae: Ovaries within normal with normal color flow. Minimal fluid over the endocervical canal. No free pelvic fluid. IMPRESSION: Single live IUP with estimated gestational age 147 weeks 4 days. Electronically Signed  By: Elberta Fortis M.D.   On: 08/10/2017 12:24    MAU Management/MDM: Ordered labs and reviewed results.  Pt has n/v but it is improved on her current home regimen of phenergan suppositories Q 6 hours.  She declines any new medications.  Reassurance provided to pt after bedside US. Pt to follow up in office as scheduled, return to MAU as needed for emergencies. Pt stable at time of discharge.  ASSESSMENT 1. Abdominal pain during pregnancy in first trimester   2. Twin gestation in first trimester, unspecified multiple gestation type   3. Nausea and vomiting during pregnancy prior to [redacted] weeks gestation     PLAN Discharge home  Allergies as of 08/29/2017      Reactions   Pecan Nut (diagnostic) Hives   Reglan [metoclopramide] Hives      Medication List    TAKE these medications   Doxylamine-Pyridoxine 10-10 MG Tbec Take 1 tablet by mouth daily.   enoxaparin 40 MG/0.4ML injection Commonly known as:  LOVENOX Inject 0.4 mLs (40 mg total) into the skin daily.   FLINSTONES GUMMIES  OMEGA-3 DHA PO Take 2 tablets by mouth daily. Takes 2 daily   progesterone 200 MG capsule Commonly known as:  PROMETRIUM 1 capsule nightly at bedtime What changed:  how much to take  how to take this  when to take this  additional instructions   promethazine 12.5 MG suppository Commonly known as:  PHENERGAN Place 1 suppository (12.5 mg total) rectally every 6 (six) hours as needed for nausea or vomiting. What changed:  Another medication with the same name was removed. Continue taking this medication, and follow the directions you see here.   VENTOLIN HFA 108 (90 Base) MCG/ACT inhaler Generic drug:  albuterol Inhale 2 puffs into the lungs See admin instructions. 4 times daily and as needed for shortness of breath and wheezing   VITAMIN B-6 PO Take 1 tablet by mouth daily.            Discharge Care Instructions        Start     Ordered   08/29/17 0000  Discharge patient    Question Answer Comment  Discharge disposition 01-Home or Self Care   Discharge patient date 08/29/2017      08/29/17 1601     Follow-up Information    [facility] Follow up.   Why:  As scheduled next week, return to MAU as needed for emergencies Contact information: [address] [place] [address] [phone]          Sharen Counter Certified Nurse-Midwife 08/29/2017  4:17 PM

## 2017-08-29 NOTE — MAU Note (Signed)
Pt still having N/V . Took Phenergan supp last night with minimal relief. C/o some cramping as well

## 2017-08-29 NOTE — Progress Notes (Addendum)
G6P0 with twin IUP @ 8.6 days.  Here dt n/v for 7 wks. Has been treated with home meds phenergan suppository. Took phenergan suppository at 0100 and states helped. Has not taken anything since.   Provider at bs assessing pt. bs sono done to re-aasure pt.  Discharge instructions given with pt understanding. Pt left unit via ambulatory with SO.

## 2017-09-05 ENCOUNTER — Other Ambulatory Visit: Payer: Self-pay | Admitting: Obstetrics & Gynecology

## 2017-09-05 ENCOUNTER — Ambulatory Visit: Payer: Medicaid Other | Admitting: Obstetrics & Gynecology

## 2017-09-05 ENCOUNTER — Other Ambulatory Visit: Payer: Medicaid Other

## 2017-09-05 DIAGNOSIS — O30009 Twin pregnancy, unspecified number of placenta and unspecified number of amniotic sacs, unspecified trimester: Secondary | ICD-10-CM

## 2017-09-05 DIAGNOSIS — N96 Recurrent pregnancy loss: Secondary | ICD-10-CM

## 2017-09-06 ENCOUNTER — Encounter: Payer: Self-pay | Admitting: Obstetrics & Gynecology

## 2017-09-06 ENCOUNTER — Ambulatory Visit (INDEPENDENT_AMBULATORY_CARE_PROVIDER_SITE_OTHER): Payer: Medicaid Other

## 2017-09-06 ENCOUNTER — Ambulatory Visit (INDEPENDENT_AMBULATORY_CARE_PROVIDER_SITE_OTHER): Payer: Medicaid Other | Admitting: Obstetrics & Gynecology

## 2017-09-06 VITALS — BP 98/62 | HR 79 | Ht 62.0 in | Wt 126.0 lb

## 2017-09-06 DIAGNOSIS — O30031 Twin pregnancy, monochorionic/diamniotic, first trimester: Secondary | ICD-10-CM | POA: Diagnosis not present

## 2017-09-06 DIAGNOSIS — O2621 Pregnancy care for patient with recurrent pregnancy loss, first trimester: Secondary | ICD-10-CM | POA: Diagnosis not present

## 2017-09-06 DIAGNOSIS — O211 Hyperemesis gravidarum with metabolic disturbance: Secondary | ICD-10-CM | POA: Diagnosis not present

## 2017-09-06 DIAGNOSIS — Z331 Pregnant state, incidental: Secondary | ICD-10-CM | POA: Diagnosis not present

## 2017-09-06 DIAGNOSIS — Z1389 Encounter for screening for other disorder: Secondary | ICD-10-CM

## 2017-09-06 DIAGNOSIS — N96 Recurrent pregnancy loss: Secondary | ICD-10-CM

## 2017-09-06 DIAGNOSIS — Z3A10 10 weeks gestation of pregnancy: Secondary | ICD-10-CM | POA: Diagnosis not present

## 2017-09-06 DIAGNOSIS — O30009 Twin pregnancy, unspecified number of placenta and unspecified number of amniotic sacs, unspecified trimester: Secondary | ICD-10-CM

## 2017-09-06 LAB — POCT URINALYSIS DIPSTICK
Blood, UA: NEGATIVE
GLUCOSE UA: NEGATIVE
KETONES UA: NEGATIVE
Leukocytes, UA: NEGATIVE
Nitrite, UA: NEGATIVE
PROTEIN UA: NEGATIVE

## 2017-09-06 MED ORDER — PROMETHAZINE HCL 12.5 MG RE SUPP: 12.5000 mg | Freq: Four times a day (QID) | RECTAL | 6 refills | Status: DC | PRN

## 2017-09-06 NOTE — Addendum Note (Signed)
Addended by: Lazaro ArmsEURE, Danna Casella H on: 09/06/2017 10:03 AM   Modules accepted: Orders

## 2017-09-06 NOTE — Progress Notes (Signed)
US 10 wks MO/DI twins:anterior pl gr 0,normal ovaries bilat,baby A appears to be slightly small than baby B BABY A right:fhr 171 bpm,crl 28.32 mm BABY B left: fhr 169 bpm,crl 34.02 mm

## 2017-09-06 NOTE — Progress Notes (Signed)
Fetal Surveillance Testing today:  Sonogram for mono di twins and recurrent pregnancy loss   High Risk Pregnancy Diagnosis(es):   As above  G6P0050 1018w0d Estimated Date of Delivery: 04/04/18  Blood pressure 98/62, pulse 79, height 5\' 2"  (1.575 m), weight 126 lb (57.2 kg), last menstrual period 06/23/2017.  Urinalysis: Negative   HPI: The patient is being seen today for ongoing management of as above. Today she reports continued nausea and vomiting   BP weight and urine results all reviewed and noted. Patient reports good fetal movement, denies any bleeding and no rupture of membranes symptoms or regular contractions.  Fundal Height:  na Fetal Heart rate:  See sono report Edema:  none  Patient is without complaints other than noted in her HPI. All questions were answered.  All lab and sonogram results have been reviewed. Comments:    Assessment:  1.  Pregnancy at 3818w0d,  Estimated Date of Delivery: 04/04/18 :                          2.  Mono Di twins                        3.  Recurrent pregnancy loss  Medication(s) Plans:  lovenox 40, baby ASA, prometrium nightly  Treatment Plan:  TLC: weekly care visits repeat scan 2 weeks of course transitioning to mono di management  Return in about 10 days (around 09/16/2017) for HROB, with Dr Despina HiddenEure. for appointment for high risk OB care  No orders of the defined types were placed in this encounter.  Orders Placed This Encounter  Procedures  . POCT urinalysis dipstick

## 2017-09-12 ENCOUNTER — Encounter: Payer: Self-pay | Admitting: Obstetrics & Gynecology

## 2017-09-16 ENCOUNTER — Encounter: Payer: Self-pay | Admitting: Obstetrics & Gynecology

## 2017-09-16 ENCOUNTER — Ambulatory Visit (INDEPENDENT_AMBULATORY_CARE_PROVIDER_SITE_OTHER): Payer: Medicaid Other | Admitting: Obstetrics & Gynecology

## 2017-09-16 VITALS — BP 102/70 | HR 119 | Ht 63.0 in | Wt 123.0 lb

## 2017-09-16 DIAGNOSIS — O211 Hyperemesis gravidarum with metabolic disturbance: Secondary | ICD-10-CM | POA: Diagnosis not present

## 2017-09-16 DIAGNOSIS — Z1389 Encounter for screening for other disorder: Secondary | ICD-10-CM | POA: Diagnosis not present

## 2017-09-16 DIAGNOSIS — O2621 Pregnancy care for patient with recurrent pregnancy loss, first trimester: Secondary | ICD-10-CM

## 2017-09-16 DIAGNOSIS — Z3A11 11 weeks gestation of pregnancy: Secondary | ICD-10-CM

## 2017-09-16 DIAGNOSIS — O30031 Twin pregnancy, monochorionic/diamniotic, first trimester: Secondary | ICD-10-CM

## 2017-09-16 DIAGNOSIS — Z331 Pregnant state, incidental: Secondary | ICD-10-CM | POA: Diagnosis not present

## 2017-09-16 LAB — POCT URINALYSIS DIPSTICK
GLUCOSE UA: NEGATIVE
Ketones, UA: NEGATIVE
Nitrite, UA: NEGATIVE

## 2017-09-16 MED ORDER — PROMETHAZINE HCL 12.5 MG RE SUPP: 12.5000 mg | Freq: Four times a day (QID) | RECTAL | 6 refills | Status: DC | PRN

## 2017-09-16 NOTE — Progress Notes (Signed)
Fetal Surveillance Testing today:  FHR 165   High Risk Pregnancy Diagnosis(es):   Recurrent pregnancy loss, monitor no chorionic dye amniotic twin gestation hyperemesis gravidarum  G6P0050 [redacted]w[redacted]d Estimated Date of Delivery: 04/04/18  Blood pressure 102/70, pulse (!) 119, height  (1.6 m), weight 123 lb (55.8 kg), last menstrual period 06/23/2017.  Urinalysis: Negative   HPI: The patient is being seen today for ongoing management of as above. Today she reports her nausea and vomiting is still significant but she is able to at least stay hydrated reasonably and maintain weight at this point She does not report any bleeding or any significant for some lower pelvic pain or pressure   BP weight and urine results all reviewed and noted. Patient reports good fetal movement, denies any bleeding and no rupture of membranes symptoms or regular contractions.  Fundal Height:  12 Fetal Heart rate:  165 Edema:  None  Patient is without complaints other than noted in her HPI. All questions were answered.  All lab and sonogram results have been reviewed. Comments:    Assessment:  1.  Pregnancy at [redacted]w[redacted]d,  Estimated Date of Delivery: 04/04/18 :                          2.  Mono dye twins                        3.  Recurrent pregnancy loss                        4.  Positive lupus anticoagulant  Medication(s) Plans:  Continue baby aspirin and Lovenox 40 mg daily, continue when necessary nausea meds  Treatment Plan:  Currently we are putting Hailey through [facility] care plan so we'll do an ultrasound on Friday with nuchal translucency for both twins as well as her integrated screen and her prenatal 1 at that time I will follow her up on Friday after she has her sonograms  Return in about 4 days (around 09/20/2017) for NT sono, HROB, with Dr Despina Hidden, PN1, IT. for appointment for high risk OB care  No orders of the defined types were placed in this encounter.  Orders Placed This Encounter  Procedures  .  US Fetal Nuchal Translucency Measurement  . POCT Urinalysis Dipstick

## 2017-09-20 ENCOUNTER — Encounter: Payer: Self-pay | Admitting: Obstetrics & Gynecology

## 2017-09-20 ENCOUNTER — Other Ambulatory Visit: Payer: Self-pay | Admitting: Obstetrics & Gynecology

## 2017-09-20 ENCOUNTER — Ambulatory Visit (INDEPENDENT_AMBULATORY_CARE_PROVIDER_SITE_OTHER): Payer: Medicaid Other | Admitting: Obstetrics & Gynecology

## 2017-09-20 ENCOUNTER — Ambulatory Visit (INDEPENDENT_AMBULATORY_CARE_PROVIDER_SITE_OTHER): Payer: Medicaid Other

## 2017-09-20 VITALS — BP 98/52 | HR 101 | Ht 63.0 in | Wt 124.0 lb

## 2017-09-20 DIAGNOSIS — O30031 Twin pregnancy, monochorionic/diamniotic, first trimester: Secondary | ICD-10-CM

## 2017-09-20 DIAGNOSIS — Z3A12 12 weeks gestation of pregnancy: Secondary | ICD-10-CM

## 2017-09-20 DIAGNOSIS — O2621 Pregnancy care for patient with recurrent pregnancy loss, first trimester: Secondary | ICD-10-CM

## 2017-09-20 DIAGNOSIS — Z331 Pregnant state, incidental: Secondary | ICD-10-CM

## 2017-09-20 DIAGNOSIS — Z3682 Encounter for antenatal screening for nuchal translucency: Secondary | ICD-10-CM

## 2017-09-20 DIAGNOSIS — Z3481 Encounter for supervision of other normal pregnancy, first trimester: Secondary | ICD-10-CM

## 2017-09-20 DIAGNOSIS — Z1389 Encounter for screening for other disorder: Secondary | ICD-10-CM

## 2017-09-20 DIAGNOSIS — O211 Hyperemesis gravidarum with metabolic disturbance: Secondary | ICD-10-CM

## 2017-09-20 NOTE — Progress Notes (Signed)
Fetal Surveillance Testing today:  FHR x 2   High Risk Pregnancy Diagnosis(es):   Mono Di twins, recurrent pregnancy loss  G6P0050 [redacted]w[redacted]d Estimated Date of Delivery: 04/04/18  Blood pressure (!) 98/52, pulse (!) 101, height  (1.6 m), weight 124 lb (56.2 kg), last menstrual period 06/23/2017.  Urinalysis: Negative   HPI: The patient is being seen today for ongoing management of as above. Today she reports continue HG   BP weight and urine results all reviewed and noted. Patient reports good fetal movement, denies any bleeding and no rupture of membranes symptoms or regular contractions.  Fundal Height:   Fetal Heart rate:  160 x 2 Edema:  none  Patient is without complaints other than noted in her HPI. All questions were answered.  All lab and sonogram results have been reviewed. Comments:    Assessment:  1.  Pregnancy at [redacted]w[redacted]d,  Estimated Date of Delivery: 04/04/18 :                          2.  Mono Di twins                        3.  Continued hyperemesis gravidarum  Medication(s) Plans:  Patient is on Reglan Prilosec Phenergan as well as her Lovenox and baby aspirin  Treatment Plan:  Continue try to support patient best we can with hydration and medication controlled her nausea, nuchal translucency today is normal for both twins and integrated testing will again  Return in about 2 weeks (around 10/04/2017) for new ob ppointment with Tish then see dr Despina Hidden. for appointment for high risk OB care  No orders of the defined types were placed in this encounter.  Orders Placed This Encounter  Procedures  . GC/Chlamydia Probe Amp  . Urine Culture  . Integrated 1  . Pain Management Screening Profile (10S)  . CBC  . Hepatitis B surface antigen  . HIV antibody  . RPR  . Rubella screen  . Urinalysis, Routine w reflex microscopic  . Varicella zoster antibody, IgG  . Cystic Fibrosis Mutation 97  . POCT urinalysis dipstick  . ABO/Rh  . Antibody screen

## 2017-09-20 NOTE — Progress Notes (Signed)
[place] 12 wks,MO/DI twins,anterior pl gr 0,normal ovaries bilat BABY A: NB present,NT 1.2 mm,FHR 154 bpm,crl 49.71 mm BABY B: NB present,NT  1.5 mm,FHT 157 bpm,crl 53.51 mm

## 2017-09-21 LAB — URINALYSIS, ROUTINE W REFLEX MICROSCOPIC
BILIRUBIN UA: NEGATIVE
Glucose, UA: NEGATIVE
Ketones, UA: NEGATIVE
LEUKOCYTES UA: NEGATIVE
Nitrite, UA: NEGATIVE
Protein, UA: NEGATIVE
RBC UA: NEGATIVE
Specific Gravity, UA: 1.013 (ref 1.005–1.030)
Urobilinogen, Ur: 0.2 mg/dL (ref 0.2–1.0)

## 2017-09-21 LAB — CBC
HEMATOCRIT: 33.9 % — AB (ref 34.0–46.6)
HEMOGLOBIN: 11.7 g/dL (ref 11.1–15.9)
MCH: 27.2 pg (ref 26.6–33.0)
MCHC: 34.5 g/dL (ref 31.5–35.7)
MCV: 79 fL (ref 79–97)
Platelets: 179 10*3/uL (ref 150–379)
RBC: 4.3 x10E6/uL (ref 3.77–5.28)
RDW: 16.3 % — ABNORMAL HIGH (ref 12.3–15.4)
WBC: 7.9 10*3/uL (ref 3.4–10.8)

## 2017-09-21 LAB — HIV ANTIBODY (ROUTINE TESTING W REFLEX): HIV SCREEN 4TH GENERATION: NONREACTIVE

## 2017-09-21 LAB — VARICELLA ZOSTER ANTIBODY, IGG

## 2017-09-21 LAB — ABO/RH: Rh Factor: POSITIVE

## 2017-09-21 LAB — RUBELLA SCREEN: Rubella Antibodies, IGG: 1.83 index (ref >0.99)

## 2017-09-21 LAB — HEPATITIS B SURFACE ANTIGEN: HEP B S AG: NEGATIVE

## 2017-09-21 LAB — MED LIST OPTION NOT SELECTED

## 2017-09-21 LAB — ANTIBODY SCREEN: Antibody Screen: NEGATIVE

## 2017-09-21 LAB — RPR: RPR Ser Ql: NONREACTIVE

## 2017-09-22 LAB — GC/CHLAMYDIA PROBE AMP
CHLAMYDIA, DNA PROBE: NEGATIVE
NEISSERIA GONORRHOEAE BY PCR: NEGATIVE

## 2017-09-23 ENCOUNTER — Telehealth: Payer: Self-pay | Admitting: *Deleted

## 2017-09-23 LAB — PMP SCREEN PROFILE (10S), URINE
AMPHETAMINE SCREEN URINE: NEGATIVE ng/mL
BARBITURATE SCREEN URINE: NEGATIVE ng/mL
BENZODIAZEPINE SCREEN, URINE: NEGATIVE ng/mL
CANNABINOIDS UR QL SCN: NEGATIVE ng/mL
Cocaine (Metab) Scrn, Ur: NEGATIVE ng/mL
Creatinine(Crt), U: 85.1 mg/dL (ref 20.0–300.0)
Methadone Screen, Urine: NEGATIVE ng/mL
OXYCODONE+OXYMORPHONE UR QL SCN: NEGATIVE ng/mL
Opiate Scrn, Ur: NEGATIVE ng/mL
PH UR, DRUG SCRN: 8.4 (ref 4.5–8.9)
PHENCYCLIDINE QUANTITATIVE URINE: NEGATIVE ng/mL
PROPOXYPHENE SCREEN URINE: NEGATIVE ng/mL

## 2017-09-23 NOTE — Telephone Encounter (Signed)
Informed patient that pharmacy did not have current insurance information. Informed ID number was given and Lovenox went throught. Verbalized understanding.

## 2017-09-24 ENCOUNTER — Encounter (HOSPITAL_COMMUNITY): Payer: Self-pay

## 2017-09-24 ENCOUNTER — Inpatient Hospital Stay (HOSPITAL_COMMUNITY)
Admission: AD | Admit: 2017-09-24 | Discharge: 2017-09-24 | Disposition: A | Discharge status: Home / Self Care | Payer: Medicaid Other | Source: Ambulatory Visit | Attending: Family Medicine | Admitting: Family Medicine

## 2017-09-24 DIAGNOSIS — Z91018 Allergy to other foods: Secondary | ICD-10-CM | POA: Diagnosis not present

## 2017-09-24 DIAGNOSIS — O99511 Diseases of the respiratory system complicating pregnancy, first trimester: Secondary | ICD-10-CM | POA: Insufficient documentation

## 2017-09-24 DIAGNOSIS — O219 Vomiting of pregnancy, unspecified: Secondary | ICD-10-CM | POA: Insufficient documentation

## 2017-09-24 DIAGNOSIS — O9989 Other specified diseases and conditions complicating pregnancy, childbirth and the puerperium: Secondary | ICD-10-CM | POA: Insufficient documentation

## 2017-09-24 DIAGNOSIS — O99611 Diseases of the digestive system complicating pregnancy, first trimester: Secondary | ICD-10-CM | POA: Diagnosis not present

## 2017-09-24 DIAGNOSIS — J45909 Unspecified asthma, uncomplicated: Secondary | ICD-10-CM | POA: Insufficient documentation

## 2017-09-24 DIAGNOSIS — E860 Dehydration: Secondary | ICD-10-CM

## 2017-09-24 DIAGNOSIS — K219 Gastro-esophageal reflux disease without esophagitis: Secondary | ICD-10-CM | POA: Insufficient documentation

## 2017-09-24 DIAGNOSIS — Z3A12 12 weeks gestation of pregnancy: Secondary | ICD-10-CM | POA: Diagnosis not present

## 2017-09-24 DIAGNOSIS — I471 Supraventricular tachycardia: Secondary | ICD-10-CM | POA: Diagnosis not present

## 2017-09-24 DIAGNOSIS — Z79899 Other long term (current) drug therapy: Secondary | ICD-10-CM | POA: Diagnosis not present

## 2017-09-24 DIAGNOSIS — Z9889 Other specified postprocedural states: Secondary | ICD-10-CM | POA: Insufficient documentation

## 2017-09-24 DIAGNOSIS — Z888 Allergy status to other drugs, medicaments and biological substances status: Secondary | ICD-10-CM | POA: Insufficient documentation

## 2017-09-24 DIAGNOSIS — F419 Anxiety disorder, unspecified: Secondary | ICD-10-CM | POA: Insufficient documentation

## 2017-09-24 DIAGNOSIS — O99411 Diseases of the circulatory system complicating pregnancy, first trimester: Secondary | ICD-10-CM | POA: Insufficient documentation

## 2017-09-24 DIAGNOSIS — R109 Unspecified abdominal pain: Secondary | ICD-10-CM | POA: Diagnosis not present

## 2017-09-24 DIAGNOSIS — Z7901 Long term (current) use of anticoagulants: Secondary | ICD-10-CM | POA: Diagnosis not present

## 2017-09-24 DIAGNOSIS — F329 Major depressive disorder, single episode, unspecified: Secondary | ICD-10-CM | POA: Diagnosis not present

## 2017-09-24 DIAGNOSIS — O99281 Endocrine, nutritional and metabolic diseases complicating pregnancy, first trimester: Secondary | ICD-10-CM | POA: Diagnosis not present

## 2017-09-24 DIAGNOSIS — O26899 Other specified pregnancy related conditions, unspecified trimester: Secondary | ICD-10-CM

## 2017-09-24 DIAGNOSIS — M329 Systemic lupus erythematosus, unspecified: Secondary | ICD-10-CM | POA: Diagnosis not present

## 2017-09-24 DIAGNOSIS — O99341 Other mental disorders complicating pregnancy, first trimester: Secondary | ICD-10-CM | POA: Insufficient documentation

## 2017-09-24 DIAGNOSIS — O26891 Other specified pregnancy related conditions, first trimester: Secondary | ICD-10-CM | POA: Insufficient documentation

## 2017-09-24 LAB — URINALYSIS, ROUTINE W REFLEX MICROSCOPIC
Bilirubin Urine: NEGATIVE
Glucose, UA: NEGATIVE mg/dL
Hgb urine dipstick: NEGATIVE
KETONES UR: 80 mg/dL — AB
Leukocytes, UA: NEGATIVE
Nitrite: NEGATIVE
PROTEIN: 100 mg/dL — AB
Specific Gravity, Urine: 1.028 (ref 1.005–1.030)
pH: 5 (ref 5.0–8.0)

## 2017-09-24 LAB — COMPREHENSIVE METABOLIC PANEL
ALBUMIN: 4 g/dL (ref 3.5–5.0)
ALK PHOS: 40 U/L (ref 38–126)
ALT: 30 U/L (ref 14–54)
ANION GAP: 11 (ref 5–15)
AST: 39 U/L (ref 15–41)
BILIRUBIN TOTAL: 0.9 mg/dL (ref 0.3–1.2)
BUN: 7 mg/dL (ref 6–20)
CALCIUM: 9.2 mg/dL (ref 8.9–10.3)
CO2: 22 mmol/L (ref 22–32)
CREATININE: 0.52 mg/dL (ref 0.44–1.00)
Chloride: 101 mmol/L (ref 101–111)
GFR calc Af Amer: >60 mL/min (ref >60)
GFR calc non Af Amer: >60 mL/min (ref >60)
GLUCOSE: 87 mg/dL (ref 65–99)
Potassium: 3.6 mmol/L (ref 3.5–5.1)
SODIUM: 134 mmol/L — AB (ref 135–145)
Total Protein: 7.3 g/dL (ref 6.5–8.1)

## 2017-09-24 LAB — WET PREP, GENITAL
CLUE CELLS WET PREP: NONE SEEN
Sperm: NONE SEEN
Trich, Wet Prep: NONE SEEN
WBC, Wet Prep HPF POC: NONE SEEN
Yeast Wet Prep HPF POC: NONE SEEN

## 2017-09-24 LAB — CBC
HEMATOCRIT: 32.5 % — AB (ref 36.0–46.0)
HEMOGLOBIN: 11.4 g/dL — AB (ref 12.0–15.0)
MCH: 27.5 pg (ref 26.0–34.0)
MCHC: 35.1 g/dL (ref 30.0–36.0)
MCV: 78.5 fL (ref 78.0–100.0)
Platelets: 152 10*3/uL (ref 150–400)
RBC: 4.14 MIL/uL (ref 3.87–5.11)
RDW: 14.7 % (ref 11.5–15.5)
WBC: 10 10*3/uL (ref 4.0–10.5)

## 2017-09-24 MED ORDER — PROMETHAZINE HCL 25 MG/ML IJ SOLN
25.0000 mg | Freq: Four times a day (QID) | INTRAMUSCULAR | Status: DC | PRN
  Filled 2017-09-24: qty 1

## 2017-09-24 MED ORDER — FAMOTIDINE IN NACL 20-0.9 MG/50ML-% IV SOLN
20.0000 mg | Freq: Once | INTRAVENOUS | Status: AC
  Administered 2017-09-24: 20 mg via INTRAVENOUS
  Filled 2017-09-24: qty 50

## 2017-09-24 MED ORDER — ONDANSETRON HCL 4 MG PO TABS: 4.0000 mg | ORAL_TABLET | Freq: Three times a day (TID) | ORAL | 3 refills | Status: DC | PRN

## 2017-09-24 MED ORDER — DEXTROSE 5 % IN LACTATED RINGERS IV BOLUS
1000.0000 mL | Freq: Once | INTRAVENOUS | Status: AC
  Administered 2017-09-24: 1000 mL via INTRAVENOUS

## 2017-09-24 MED ORDER — ONDANSETRON HCL 4 MG/2ML IJ SOLN
4.0000 mg | Freq: Once | INTRAMUSCULAR | Status: AC
  Administered 2017-09-24: 4 mg via INTRAVENOUS
  Filled 2017-09-24: qty 2

## 2017-09-24 NOTE — MAU Provider Note (Signed)
Chief Complaint: Abdominal Pain and Emesis   None     SUBJECTIVE HPI: Carol Bernard is a 22 y.o. G6P0050 at [redacted]w[redacted]d by 1st trimester [place] with mono/di twins pregnancy and recurrent pregnancy loss, pt of [facility], who presents to maternity admissions reporting vomiting all day while at work which is not unusual for her but feeling more abdominal cramping associated with her vomiting. 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  She has phenergan suppositories but is unable to use this at work because of drowsiness.  The abdominal cramping is associated with vaginal burning, not dysuria but generalized burning. She has not tried any treatments. She denies vaginal bleeding, vaginal itching/burning, urinary symptoms, h/a, dizziness, or fever/chills.     HPI  Past Medical History:  Diagnosis Date  . Anxiety   . Anxiety and depression   . Asthma   . Birth defect   . Cyst of left kidney   . Depression   . Dysrhythmia    h/o SVT  . GERD (gastroesophageal reflux disease)   . Headache   . Hypoglycemia   . Lupus   . Miscarriage   . Palpitations    Past Surgical History:  Procedure Laterality Date  . DILATION AND CURETTAGE OF UTERUS N/A 01/29/2017   Procedure: SUCTION DILATATION AND CURETTAGE;  Surgeon: Tilda Burrow, MD;  Location: [facility];  Service: Gynecology;  Laterality: N/A;  . DILATION AND CURETTAGE OF UTERUS N/A 05/01/2017   Procedure: SUCTION DILATATION AND CURETTAGE;  Surgeon: Lazaro Arms, MD;  Location: [facility];  Service: Gynecology;  Laterality: N/A;  pt to arrive at 7:15 to have labwork  . WISDOM TOOTH EXTRACTION     Social History   Social History  . Marital status: Single    Spouse name: N/A  . Number of children: N/A  . Years of education: N/A   Occupational History  . Not on file.   Social History Main Topics  . Smoking status: Never Smoker  . Smokeless tobacco: Never Used  . Alcohol use No  . Drug use: No  .  Sexual activity: Not Currently    Birth control/ protection: None   Other Topics Concern  . Not on file   Social History Narrative  . No narrative on file   No current facility-administered medications on file prior to encounter.    Current Outpatient Prescriptions on File Prior to Encounter  Medication Sig Dispense Refill  . albuterol (VENTOLIN HFA) 108 (90 Base) MCG/ACT inhaler Inhale 2 puffs into the lungs See admin instructions. 4 times daily and as needed for shortness of breath and wheezing    . enoxaparin (LOVENOX) 40 MG/0.4ML injection Inject 0.4 mLs (40 mg total) into the skin daily. 30 Syringe 11  . Pediatric Multiple Vit-C-FA (FLINSTONES GUMMIES OMEGA-3 DHA PO) Take 2 tablets by mouth daily. Takes 2 daily     . progesterone (PROMETRIUM) 200 MG capsule 1 capsule nightly at bedtime (Patient taking differently: Place 200 mg vaginally at bedtime. 1 capsule nightly at bedtime) 30 capsule 3  . promethazine (PHENERGAN) 12.5 MG suppository Place 1 suppository (12.5 mg total) rectally every 6 (six) hours as needed for nausea or vomiting. 24 each 6  . Doxylamine-Pyridoxine 10-10 MG TBEC Take 1 tablet by mouth daily. 30 tablet 0  . Pyridoxine HCl (VITAMIN B-6 PO) Take 1 tablet by mouth daily.     Allergies  Allergen Reactions  .  Pecan Nut (Diagnostic) Hives  . Reglan [Metoclopramide] Hives    ROS:  Review of Systems  Constitutional: Negative for chills, fatigue and fever.  Respiratory: Negative for shortness of breath.   Cardiovascular: Negative for chest pain.  Genitourinary: Positive for pelvic pain. Negative for difficulty urinating, dysuria, flank pain, vaginal bleeding, vaginal discharge and vaginal pain.  Neurological: Negative for dizziness and headaches.  Psychiatric/Behavioral: Negative.      I have reviewed patient's Past Medical Hx, Surgical Hx, Family Hx, Social Hx, medications and allergies.   Physical Exam   Patient Vitals for the past 24 hrs:  BP Temp Temp  src Pulse Resp SpO2 Height Weight  09/24/17 0224 109/66 98.1 F (36.7 C) Oral 88 18 100 %  (1.6 m) 124 lb (56.2 kg)   Constitutional: Well-developed, well-nourished female in no acute distress.  Cardiovascular: normal rate Respiratory: normal effort GI: Abd soft, non-tender. Pos BS x 4 MS: Extremities nontender, no edema, normal ROM Neurologic: Alert and oriented x 4.  GU: Neg CVAT.  FHT Baby A 152, Baby B 157 by doppler  Dilation: Closed Effacement (%): Thick Cervical Position: Posterior Exam by:: L Leftwich Kirby CNM   Vaginal cultures collected by blind swab. Vulva/vagina/perineum wnl on external exam.  LAB RESULTS Results for orders placed or performed during the hospital encounter of 09/24/17 (from the past 24 hour(s))  Urinalysis, Routine w reflex microscopic     Status: Abnormal   Collection Time: 09/24/17  2:20 AM  Result Value Ref Range   Color, Urine YELLOW YELLOW   APPearance HAZY (A) CLEAR   Specific Gravity, Urine 1.028 1.005 - 1.030   pH 5.0 5.0 - 8.0   Glucose, UA NEGATIVE NEGATIVE mg/dL   Hgb urine dipstick NEGATIVE NEGATIVE   Bilirubin Urine NEGATIVE NEGATIVE   Ketones, ur 80 (A) NEGATIVE mg/dL   Protein, ur 161 (A) NEGATIVE mg/dL   Nitrite NEGATIVE NEGATIVE   Leukocytes, UA NEGATIVE NEGATIVE   RBC / HPF 0-5 0 - 5 RBC/hpf   WBC, UA 0-5 0 - 5 WBC/hpf   Bacteria, UA RARE (A) NONE SEEN   Squamous Epithelial / LPF 0-5 (A) NONE SEEN   Mucus PRESENT   CBC     Status: Abnormal   Collection Time: 09/24/17  4:05 AM  Result Value Ref Range   WBC 10.0 4.0 - 10.5 K/uL   RBC 4.14 3.87 - 5.11 MIL/uL   Hemoglobin 11.4 (L) 12.0 - 15.0 g/dL   HCT 09.6 (L) 04.5 - 40.9 %   MCV 78.5 78.0 - 100.0 fL   MCH 27.5 26.0 - 34.0 pg   MCHC 35.1 30.0 - 36.0 g/dL   RDW 81.1 91.4 - 78.2 %   Platelets 152 150 - 400 K/uL  Comprehensive metabolic panel     Status: Abnormal   Collection Time: 09/24/17  4:05 AM  Result Value Ref Range   Sodium 134 (L) 135 - 145 mmol/L    Potassium 3.6 3.5 - 5.1 mmol/L   Chloride 101 101 - 111 mmol/L   CO2 22 22 - 32 mmol/L   Glucose, Bld 87 65 - 99 mg/dL   BUN 7 6 - 20 mg/dL   Creatinine, Ser 9.56 0.44 - 1.00 mg/dL   Calcium 9.2 8.9 - 21.3 mg/dL   Total Protein 7.3 6.5 - 8.1 g/dL   Albumin 4.0 3.5 - 5.0 g/dL   AST 39 15 - 41 U/L   ALT 30 14 - 54 U/L   Alkaline Phosphatase  40 38 - 126 U/L   Total Bilirubin 0.9 0.3 - 1.2 mg/dL   GFR calc non Af Amer >60 >60 mL/min   GFR calc Af Amer >60 >60 mL/min   Anion gap 11 5 - 15    A/Positive/-- (09/21 1416)  IMAGING   MAU Management/MDM: Ordered labs and reviewed results. No evidence of UTI with negative leukocytes and negative nitrites.  Will send urine for culture.  Pt with dehydration with 80 ketones so fluid replaced with D5LR x 1000 ml.  Pt declined additional fluids and desired to be discharged to take nausea medications at home.  Cervix closed.  D/C home with Rx for Zofran.  F/U at [facility] as scheduled.  Return to MAU as needed for emergencies.  Pt stable at time of discharge.  ASSESSMENT 1. Nausea and vomiting of pregnancy, antepartum   2. Mild dehydration   3. Abdominal cramping affecting pregnancy     PLAN Discharge home Allergies as of 09/24/2017      Reactions   Pecan Nut (diagnostic) Hives   Reglan [metoclopramide] Hives      Medication List    TAKE these medications   Doxylamine-Pyridoxine 10-10 MG Tbec Take 1 tablet by mouth daily.   enoxaparin 40 MG/0.4ML injection Commonly known as:  LOVENOX Inject 0.4 mLs (40 mg total) into the skin daily.   FLINSTONES GUMMIES OMEGA-3 DHA PO Take 2 tablets by mouth daily. Takes 2 daily   ondansetron 4 MG tablet Commonly known as:  ZOFRAN Take 1-2 tablets (4-8 mg total) by mouth every 8 (eight) hours as needed for nausea or vomiting.   progesterone 200 MG capsule Commonly known as:  PROMETRIUM 1 capsule nightly at bedtime What changed:  how much to take  how to take this  when to take  this  additional instructions   promethazine 12.5 MG suppository Commonly known as:  PHENERGAN Place 1 suppository (12.5 mg total) rectally every 6 (six) hours as needed for nausea or vomiting.   VENTOLIN HFA 108 (90 Base) MCG/ACT inhaler Generic drug:  albuterol Inhale 2 puffs into the lungs See admin instructions. 4 times daily and as needed for shortness of breath and wheezing   VITAMIN B-6 PO Take 1 tablet by mouth daily.            Discharge Care Instructions        Start     Ordered   09/24/17 0000  ondansetron (ZOFRAN) 4 MG tablet  Every 8 hours PRN    Question:  Supervising Provider  Answer:  Reva Bores   09/24/17 0528   09/24/17 0000  Discharge patient    Question Answer Comment  Discharge disposition 01-Home or Self Care   Discharge patient date 09/24/2017      09/24/17 1610     Follow-up Information    [facility] Follow up.   Why:  As scheduled, return to MAU as needed for emergencies Contact information: [address] [place] [address] [phone]          Sharen Counter Certified Nurse-Midwife 09/24/2017  5:31 AM

## 2017-09-24 NOTE — MAU Note (Signed)
Pt here with c/o abdominal pain, vomiting, vaginal burning. Pregnant with mono/di twins. Took a urine test at work and reports being dehydrated and having possible UTI.

## 2017-09-25 LAB — GC/CHLAMYDIA PROBE AMP (~~LOC~~) NOT AT ARMC
CHLAMYDIA, DNA PROBE: NEGATIVE
NEISSERIA GONORRHEA: NEGATIVE

## 2017-09-26 ENCOUNTER — Telehealth: Payer: Self-pay | Admitting: Obstetrics & Gynecology

## 2017-09-26 NOTE — Telephone Encounter (Signed)
Missing information called to [facility].

## 2017-09-27 ENCOUNTER — Encounter: Payer: Self-pay | Admitting: *Deleted

## 2017-09-27 LAB — INTEGRATED 1
CROWN RUMP LENGTH MAT SCREEN: 49.7 mm
CROWN RUMP LENGTH TWIN B: 53.5 mm
Gest. Age on Collection Date: 12 weeks
MATERNAL AGE AT EDD: 21.6 a
NT Twin B: 1.5 mm
NUCHAL TRANSLUCENCY (NT): 1.2 mm
Number of Fetuses: 2
PAPP-A Value: 2012.6 ng/mL
Weight: 124 [lb_av]

## 2017-09-30 LAB — CYSTIC FIBROSIS MUTATION 97: GENE DIS ANAL CARRIER INTERP BLD/T-IMP: NOT DETECTED

## 2017-10-04 ENCOUNTER — Ambulatory Visit (INDEPENDENT_AMBULATORY_CARE_PROVIDER_SITE_OTHER): Payer: Commercial Managed Care - PPO | Admitting: Obstetrics & Gynecology

## 2017-10-04 ENCOUNTER — Ambulatory Visit: Payer: Commercial Managed Care - PPO | Admitting: *Deleted

## 2017-10-04 ENCOUNTER — Encounter: Payer: Self-pay | Admitting: Obstetrics & Gynecology

## 2017-10-04 VITALS — BP 102/64 | HR 74 | Wt 124.0 lb

## 2017-10-04 DIAGNOSIS — O0992 Supervision of high risk pregnancy, unspecified, second trimester: Secondary | ICD-10-CM

## 2017-10-04 DIAGNOSIS — Z1389 Encounter for screening for other disorder: Secondary | ICD-10-CM

## 2017-10-04 DIAGNOSIS — O2622 Pregnancy care for patient with recurrent pregnancy loss, second trimester: Secondary | ICD-10-CM

## 2017-10-04 DIAGNOSIS — O30032 Twin pregnancy, monochorionic/diamniotic, second trimester: Secondary | ICD-10-CM

## 2017-10-04 DIAGNOSIS — Z3A14 14 weeks gestation of pregnancy: Secondary | ICD-10-CM

## 2017-10-04 DIAGNOSIS — O30031 Twin pregnancy, monochorionic/diamniotic, first trimester: Secondary | ICD-10-CM

## 2017-10-04 DIAGNOSIS — Z331 Pregnant state, incidental: Secondary | ICD-10-CM

## 2017-10-04 LAB — POCT URINALYSIS DIPSTICK
GLUCOSE UA: NEGATIVE
Ketones, UA: NEGATIVE
Leukocytes, UA: NEGATIVE
NITRITE UA: NEGATIVE
RBC UA: NEGATIVE

## 2017-10-04 NOTE — Progress Notes (Signed)
Fetal Surveillance Testing today:  150/140   High Risk Pregnancy Diagnosis(es):   Mon/Di twins, recurrent pregnancy   G6P0050 [redacted]w[redacted]d Estimated Date of Delivery: 04/04/18  Blood pressure 102/64, pulse 74, weight 124 lb (56.2 kg), last menstrual period 06/23/2017.  Urinalysis: Negative   HPI: The patient is being seen today for ongoing management of as above. Today she reports nausea is getting better although slowly, throws up none to 3-4 times a day   BP weight and urine results all reviewed and noted. Patient reports good fetal movement, denies any bleeding and no rupture of membranes symptoms or regular contractions.  Fundal Height:  20 Fetal Heart rate:  150/140 Edema:  none  Patient is without complaints other than noted in her HPI. All questions were answered.  All lab and sonogram results have been reviewed. Comments:    Assessment:  1.  Pregnancy at [redacted]w[redacted]d,  Estimated Date of Delivery: 04/04/18 :                          2.  Mono/Di twins                        3.  Recurrent pregnancy loss  Medication(s) Plans:  lvoenox 40 daily and baby ASA  Treatment Plan:  Per protocol mono di twins, + evaluate for short cervix  Return in about 2 weeks (around 10/18/2017) for HROB, with Dr Despina Hidden. for appointment for high risk OB care  No orders of the defined types were placed in this encounter.  No orders of the defined types were placed in this encounter.

## 2017-10-15 DIAGNOSIS — Z029 Encounter for administrative examinations, unspecified: Secondary | ICD-10-CM

## 2017-10-18 ENCOUNTER — Ambulatory Visit (INDEPENDENT_AMBULATORY_CARE_PROVIDER_SITE_OTHER): Payer: Commercial Managed Care - PPO | Admitting: Obstetrics & Gynecology

## 2017-10-18 ENCOUNTER — Encounter: Payer: Self-pay | Admitting: Obstetrics & Gynecology

## 2017-10-18 VITALS — BP 110/60 | HR 107 | Wt 125.0 lb

## 2017-10-18 DIAGNOSIS — O2622 Pregnancy care for patient with recurrent pregnancy loss, second trimester: Secondary | ICD-10-CM

## 2017-10-18 DIAGNOSIS — O99112 Other diseases of the blood and blood-forming organs and certain disorders involving the immune mechanism complicating pregnancy, second trimester: Secondary | ICD-10-CM

## 2017-10-18 DIAGNOSIS — Z1389 Encounter for screening for other disorder: Secondary | ICD-10-CM

## 2017-10-18 DIAGNOSIS — D6862 Lupus anticoagulant syndrome: Secondary | ICD-10-CM

## 2017-10-18 DIAGNOSIS — O30032 Twin pregnancy, monochorionic/diamniotic, second trimester: Secondary | ICD-10-CM

## 2017-10-18 DIAGNOSIS — O0992 Supervision of high risk pregnancy, unspecified, second trimester: Secondary | ICD-10-CM

## 2017-10-18 DIAGNOSIS — O211 Hyperemesis gravidarum with metabolic disturbance: Secondary | ICD-10-CM

## 2017-10-18 DIAGNOSIS — Z1379 Encounter for other screening for genetic and chromosomal anomalies: Secondary | ICD-10-CM

## 2017-10-18 DIAGNOSIS — Z3A16 16 weeks gestation of pregnancy: Secondary | ICD-10-CM

## 2017-10-18 DIAGNOSIS — Z331 Pregnant state, incidental: Secondary | ICD-10-CM

## 2017-10-18 LAB — POCT URINALYSIS DIPSTICK
Glucose, UA: NEGATIVE
Leukocytes, UA: NEGATIVE
Nitrite, UA: NEGATIVE
RBC UA: NEGATIVE

## 2017-10-18 NOTE — Addendum Note (Signed)
Addended by: Moss McRESENZO, Keil Pickering M on: 10/18/2017 12:40 PM   Modules accepted: Orders

## 2017-10-18 NOTE — Progress Notes (Signed)
Fetal Surveillance Testing today:  142/150   High Risk Pregnancy Diagnosis(es):   Mono Di twins, recurrent pregnancy loss, +lupus anticoagulat  G6P0050 2943w0d Estimated Date of Delivery: 04/04/18  Blood pressure 110/60, pulse (!) 107, weight 125 lb (56.7 kg), last menstrual period 06/23/2017.  Urinalysis: moderate ketones   HPI: The patient is being seen today for ongoing management of as above. Today she reports nausea is getting better   BP weight and urine results all reviewed and noted. Patient reports good fetal movement, denies any bleeding and no rupture of membranes symptoms or regular contractions.  Fundal Height:  20 Fetal Heart rate:  142/150 Edema:  none  Patient is without complaints other than noted in her HPI. All questions were answered.  All lab and sonogram results have been reviewed. Comments:    Assessment:  1.  Pregnancy at 2143w0d,  Estimated Date of Delivery: 04/04/18 :                          2.  Mono Di twins                        3.  Hyperemesis Gravidarum                        4.  Recurrent pregnancy loss with +LA  Medication(s) Plans:  lovenox 40 daily, baby ASA, prometrium 200 qhs  Treatment Plan:  2nd IT today sonogram next visit  Return in about 3 weeks (around 11/07/2017) for 20 week sono, Twins, HROB, with Dr Despina HiddenEure. for appointment for high risk OB care  No orders of the defined types were placed in this encounter.  Orders Placed This Encounter  Procedures  . US OB Comp + 14 Wk  . US OB DETAIL ADDL GEST + 14 WK  . POCT urinalysis dipstick

## 2017-10-25 LAB — INTEGRATED 2
ADSF: 2.59
AFP MARKER: 122.8 ng/mL
AFP MoM: 3.56
CROWN RUMP LENGTH TWIN B: 53.5 mm
Crown Rump Length: 49.7 mm
DIA MOM: 2.44
DIA VALUE: 465.6 pg/mL
Estriol, Unconjugated: 2.06 ng/mL
GESTATIONAL AGE: 16 wk
Gest. Age on Collection Date: 12 weeks
HCG MOM: 3.56
MATERNAL AGE AT EDD: 21.6 a
NT MoM Twin B: 1.06
NT TWIN B: 1.5 mm
NUCHAL TRANSLUCENCY MOM: 0.89
NUMBER OF FETUSES: 2
Nuchal Translucency (NT): 1.2 mm
PAPP-A MOM: 2.11
PAPP-A VALUE: 2012.6 ng/mL
WEIGHT: 124 [lb_av]
Weight: 125 [lb_av]
hCG Value: 133.9 IU/mL

## 2017-10-30 ENCOUNTER — Telehealth: Payer: Self-pay | Admitting: Obstetrics & Gynecology

## 2017-10-30 NOTE — Telephone Encounter (Signed)
Patient states she awoke during the night having a couple of episodes of abdominal pain. When she got up she vomited 6 times and after that felt better. She is currently not having any pain/bleeding,etc. Advised patient that it could have been gas pains and since it was 1 time, just to continue to monitor. Advised to rest, push fluids, no heavy lifting and to let us know if pain returned with intensity or frequency. Pt verbalized understanding.

## 2017-11-02 ENCOUNTER — Inpatient Hospital Stay (HOSPITAL_COMMUNITY)
Admission: AD | Admit: 2017-11-02 | Discharge: 2017-11-02 | Disposition: A | Discharge status: Home / Self Care | Payer: Medicaid Other | Source: Ambulatory Visit | Attending: Obstetrics and Gynecology | Admitting: Obstetrics and Gynecology

## 2017-11-02 ENCOUNTER — Encounter (HOSPITAL_COMMUNITY): Payer: Self-pay

## 2017-11-02 DIAGNOSIS — Z3A18 18 weeks gestation of pregnancy: Secondary | ICD-10-CM | POA: Diagnosis not present

## 2017-11-02 DIAGNOSIS — O26892 Other specified pregnancy related conditions, second trimester: Secondary | ICD-10-CM | POA: Diagnosis not present

## 2017-11-02 DIAGNOSIS — R109 Unspecified abdominal pain: Secondary | ICD-10-CM | POA: Diagnosis present

## 2017-11-02 DIAGNOSIS — O2342 Unspecified infection of urinary tract in pregnancy, second trimester: Secondary | ICD-10-CM

## 2017-11-02 LAB — CBC WITH DIFFERENTIAL/PLATELET
BASOS ABS: 0 10*3/uL (ref 0.0–0.1)
BASOS PCT: 0 %
EOS ABS: 0.1 10*3/uL (ref 0.0–0.7)
EOS PCT: 1 %
HCT: 29.7 % — ABNORMAL LOW (ref 36.0–46.0)
HEMOGLOBIN: 10.2 g/dL — AB (ref 12.0–15.0)
LYMPHS ABS: 1.1 10*3/uL (ref 0.7–4.0)
Lymphocytes Relative: 11 %
MCH: 27.7 pg (ref 26.0–34.0)
MCHC: 34.3 g/dL (ref 30.0–36.0)
MCV: 80.7 fL (ref 78.0–100.0)
Monocytes Absolute: 0.4 10*3/uL (ref 0.1–1.0)
Monocytes Relative: 4 %
NEUTROS PCT: 84 %
Neutro Abs: 8.4 10*3/uL — ABNORMAL HIGH (ref 1.7–7.7)
PLATELETS: 170 10*3/uL (ref 150–400)
RBC: 3.68 MIL/uL — AB (ref 3.87–5.11)
RDW: 14.3 % (ref 11.5–15.5)
WBC: 10 10*3/uL (ref 4.0–10.5)

## 2017-11-02 LAB — URINALYSIS, ROUTINE W REFLEX MICROSCOPIC
BILIRUBIN URINE: NEGATIVE
GLUCOSE, UA: NEGATIVE mg/dL
HGB URINE DIPSTICK: NEGATIVE
KETONES UR: 20 mg/dL — AB
LEUKOCYTES UA: NEGATIVE
NITRITE: NEGATIVE
PH: 6 (ref 5.0–8.0)
PROTEIN: 100 mg/dL — AB
Specific Gravity, Urine: 1.021 (ref 1.005–1.030)

## 2017-11-02 MED ORDER — NITROFURANTOIN MONOHYD MACRO 100 MG PO CAPS: 100.0000 mg | ORAL_CAPSULE | Freq: Two times a day (BID) | ORAL | 0 refills | Status: DC

## 2017-11-02 NOTE — MAU Note (Signed)
Pt is a G6P0050 at 18.1 weeks with mono/di IUP.  Pt is c/o right sided abdominal pain. No other PTL symptoms, has not yet felt fetal movement.  Pt has h/o N&V during pregnancy and is currently unable to keep food down.

## 2017-11-02 NOTE — Discharge Instructions (Signed)

## 2017-11-02 NOTE — MAU Provider Note (Signed)
History   G6P0050 with hx of recurrent preg loss in with c/o right side abd pain that has been going on for a week but got worse last night. Pain now 3/10. Denies ROM or vag bleeding.  CSN: [id number]  Arrival date & time 11/02/17  1010   None     Chief Complaint  Patient presents with  . Abdominal Pain    HPI  Past Medical History:  Diagnosis Date  . Anxiety   . Anxiety and depression   . Asthma   . Birth defect   . Cyst of left kidney   . Depression   . Dysrhythmia    h/o SVT  . GERD (gastroesophageal reflux disease)   . Headache   . Hypoglycemia   . Lupus   . Miscarriage   . Palpitations     Past Surgical History:  Procedure Laterality Date  . DILATION AND CURETTAGE OF UTERUS N/A 01/29/2017   Procedure: SUCTION DILATATION AND CURETTAGE;  Surgeon: Tilda Burrow, MD;  Location: [facility];  Service: Gynecology;  Laterality: N/A;  . DILATION AND CURETTAGE OF UTERUS N/A 05/01/2017   Procedure: SUCTION DILATATION AND CURETTAGE;  Surgeon: Lazaro Arms, MD;  Location: [facility];  Service: Gynecology;  Laterality: N/A;  pt to arrive at 7:15 to have labwork  . WISDOM TOOTH EXTRACTION      Family History  Problem Relation Age of Onset  . Migraines Sister   . Hypertension Maternal Grandmother   . Skin cancer Maternal Grandmother   . Heart disease Maternal Grandfather     Social History  Substance Use Topics  . Smoking status: Never Smoker  . Smokeless tobacco: Never Used  . Alcohol use No    OB History    Gravida Para Term Preterm AB Living   6 0     5     SAB TAB Ectopic Multiple Live Births   4              Review of Systems  Constitutional: Negative.   HENT: Negative.   Eyes: Negative.   Respiratory: Negative.   Cardiovascular: Negative.   Gastrointestinal: Positive for abdominal pain.  Endocrine: Negative.   Genitourinary: Negative.   Musculoskeletal: Negative.   Skin: Negative.   Allergic/Immunologic: Negative.   Neurological: Negative.    Hematological: Negative.   Psychiatric/Behavioral: Negative.     Allergies  Pecan nut (diagnostic) and Reglan [metoclopramide]  Home Medications    Temp 98.1 F (36.7 C) (Oral)   LMP 06/23/2017   Physical Exam  Constitutional: She is oriented to person, place, and time. She appears well-developed and well-nourished.  HENT:  Head: Normocephalic.  Eyes: Pupils are equal, round, and reactive to light.  Neck: Normal range of motion.  Cardiovascular: Normal rate, regular rhythm, normal heart sounds and intact distal pulses.   Pulmonary/Chest: Effort normal and breath sounds normal.  Abdominal: Soft. Bowel sounds are normal.  Genitourinary: Vagina normal and uterus normal.  Musculoskeletal: Normal range of motion.  Neurological: She is alert and oriented to person, place, and time. She has normal reflexes.  Skin: Skin is warm and dry.  Psychiatric: She has a normal mood and affect. Her behavior is normal. Judgment and thought content normal.    MAU Course  Procedures (including critical care time)  Labs Reviewed  WET PREP, GENITAL  GC/CHLAMYDIA PROBE AMP ([place]) NOT AT [facility]   No results found.   1. Abdominal pain in pregnancy, second trimester  MDM  VSS, Bedside u/s shows two active fetuses. SVE firm/cl/post/high. No abnormal discharge. Culture sent, will treat with macrobid and [facility]

## 2017-11-03 LAB — URINE CULTURE

## 2017-11-04 ENCOUNTER — Telehealth: Payer: Self-pay | Admitting: Women's Health

## 2017-11-04 NOTE — Telephone Encounter (Signed)
Patient called stating she was prescribed Macrobid for an UTI. She has been trying to eat something when she takes it but throws up every time. She is using the phenergan suppositories which is helping but not when taking the antibiotics. States she is eating crackers, mashed potatoes, etc to "mix it up". Advised patient she didn't need to fill her stomach when taking the medication, just needed a little something. Advised just to eat just a few crackers and if that didn't work to let us know. Pt verbalized understanding.

## 2017-11-05 ENCOUNTER — Other Ambulatory Visit: Payer: Self-pay | Admitting: Obstetrics & Gynecology

## 2017-11-05 MED ORDER — ENOXAPARIN SODIUM 40 MG/0.4ML ~~LOC~~ SOLN: 40.0000 mg | SUBCUTANEOUS | 4 refills | Status: DC

## 2017-11-06 ENCOUNTER — Inpatient Hospital Stay (HOSPITAL_COMMUNITY)
Admission: AD | Admit: 2017-11-06 | Discharge: 2017-11-06 | Disposition: A | Discharge status: Home / Self Care | Payer: Medicaid Other | Source: Ambulatory Visit | Attending: Obstetrics & Gynecology | Admitting: Obstetrics & Gynecology

## 2017-11-06 DIAGNOSIS — R079 Chest pain, unspecified: Secondary | ICD-10-CM

## 2017-11-06 DIAGNOSIS — O9989 Other specified diseases and conditions complicating pregnancy, childbirth and the puerperium: Secondary | ICD-10-CM | POA: Diagnosis not present

## 2017-11-06 DIAGNOSIS — I471 Supraventricular tachycardia: Secondary | ICD-10-CM | POA: Diagnosis not present

## 2017-11-06 DIAGNOSIS — D649 Anemia, unspecified: Secondary | ICD-10-CM | POA: Diagnosis not present

## 2017-11-06 DIAGNOSIS — O26892 Other specified pregnancy related conditions, second trimester: Secondary | ICD-10-CM | POA: Insufficient documentation

## 2017-11-06 DIAGNOSIS — F329 Major depressive disorder, single episode, unspecified: Secondary | ICD-10-CM | POA: Insufficient documentation

## 2017-11-06 DIAGNOSIS — O30032 Twin pregnancy, monochorionic/diamniotic, second trimester: Secondary | ICD-10-CM | POA: Diagnosis not present

## 2017-11-06 DIAGNOSIS — M329 Systemic lupus erythematosus, unspecified: Secondary | ICD-10-CM | POA: Insufficient documentation

## 2017-11-06 DIAGNOSIS — M545 Low back pain: Secondary | ICD-10-CM | POA: Diagnosis not present

## 2017-11-06 DIAGNOSIS — Z3A18 18 weeks gestation of pregnancy: Secondary | ICD-10-CM | POA: Insufficient documentation

## 2017-11-06 DIAGNOSIS — O99512 Diseases of the respiratory system complicating pregnancy, second trimester: Secondary | ICD-10-CM | POA: Insufficient documentation

## 2017-11-06 DIAGNOSIS — J45909 Unspecified asthma, uncomplicated: Secondary | ICD-10-CM | POA: Diagnosis not present

## 2017-11-06 DIAGNOSIS — O99112 Other diseases of the blood and blood-forming organs and certain disorders involving the immune mechanism complicating pregnancy, second trimester: Secondary | ICD-10-CM | POA: Diagnosis not present

## 2017-11-06 DIAGNOSIS — O99412 Diseases of the circulatory system complicating pregnancy, second trimester: Secondary | ICD-10-CM | POA: Diagnosis not present

## 2017-11-06 DIAGNOSIS — D696 Thrombocytopenia, unspecified: Secondary | ICD-10-CM | POA: Diagnosis not present

## 2017-11-06 DIAGNOSIS — O99342 Other mental disorders complicating pregnancy, second trimester: Secondary | ICD-10-CM | POA: Diagnosis not present

## 2017-11-06 DIAGNOSIS — O212 Late vomiting of pregnancy: Secondary | ICD-10-CM | POA: Diagnosis not present

## 2017-11-06 DIAGNOSIS — O99612 Diseases of the digestive system complicating pregnancy, second trimester: Secondary | ICD-10-CM | POA: Diagnosis not present

## 2017-11-06 DIAGNOSIS — M549 Dorsalgia, unspecified: Secondary | ICD-10-CM

## 2017-11-06 DIAGNOSIS — O219 Vomiting of pregnancy, unspecified: Secondary | ICD-10-CM | POA: Diagnosis not present

## 2017-11-06 DIAGNOSIS — O99012 Anemia complicating pregnancy, second trimester: Secondary | ICD-10-CM | POA: Diagnosis not present

## 2017-11-06 DIAGNOSIS — F419 Anxiety disorder, unspecified: Secondary | ICD-10-CM | POA: Diagnosis not present

## 2017-11-06 DIAGNOSIS — D6959 Other secondary thrombocytopenia: Secondary | ICD-10-CM | POA: Diagnosis not present

## 2017-11-06 DIAGNOSIS — K219 Gastro-esophageal reflux disease without esophagitis: Secondary | ICD-10-CM | POA: Diagnosis not present

## 2017-11-06 LAB — CBC
HEMATOCRIT: 26.3 % — AB (ref 36.0–46.0)
Hemoglobin: 9.1 g/dL — ABNORMAL LOW (ref 12.0–15.0)
MCH: 28.3 pg (ref 26.0–34.0)
MCHC: 34.6 g/dL (ref 30.0–36.0)
MCV: 81.7 fL (ref 78.0–100.0)
Platelets: 132 10*3/uL — ABNORMAL LOW (ref 150–400)
RBC: 3.22 MIL/uL — ABNORMAL LOW (ref 3.87–5.11)
RDW: 14.6 % (ref 11.5–15.5)
WBC: 8.2 10*3/uL (ref 4.0–10.5)

## 2017-11-06 LAB — URINALYSIS, ROUTINE W REFLEX MICROSCOPIC
GLUCOSE, UA: NEGATIVE mg/dL
HGB URINE DIPSTICK: NEGATIVE
KETONES UR: 80 mg/dL — AB
NITRITE: NEGATIVE
PH: 5 (ref 5.0–8.0)
PROTEIN: 100 mg/dL — AB
Specific Gravity, Urine: 1.027 (ref 1.005–1.030)

## 2017-11-06 LAB — COMPREHENSIVE METABOLIC PANEL
ALBUMIN: 3 g/dL — AB (ref 3.5–5.0)
ALT: 33 U/L (ref 14–54)
ANION GAP: 10 (ref 5–15)
AST: 34 U/L (ref 15–41)
Alkaline Phosphatase: 58 U/L (ref 38–126)
BILIRUBIN TOTAL: 1.3 mg/dL — AB (ref 0.3–1.2)
BUN: 6 mg/dL (ref 6–20)
CALCIUM: 8.3 mg/dL — AB (ref 8.9–10.3)
CO2: 19 mmol/L — ABNORMAL LOW (ref 22–32)
Chloride: 105 mmol/L (ref 101–111)
Creatinine, Ser: 0.57 mg/dL (ref 0.44–1.00)
GFR calc non Af Amer: >60 mL/min (ref >60)
GLUCOSE: 239 mg/dL — AB (ref 65–99)
POTASSIUM: 2.9 mmol/L — AB (ref 3.5–5.1)
Sodium: 134 mmol/L — ABNORMAL LOW (ref 135–145)
TOTAL PROTEIN: 6.1 g/dL — AB (ref 6.5–8.1)

## 2017-11-06 MED ORDER — CYCLOBENZAPRINE HCL 10 MG PO TABS
10.0000 mg | ORAL_TABLET | Freq: Once | ORAL | Status: AC
  Administered 2017-11-06: 10 mg via ORAL
  Filled 2017-11-06: qty 1

## 2017-11-06 MED ORDER — IRON POLYSACCH CMPLX-B12-FA 150-0.025-1 MG PO CAPS: ORAL_CAPSULE | ORAL | 2 refills | Status: DC

## 2017-11-06 MED ORDER — ONDANSETRON HCL 4 MG/2ML IJ SOLN
4.0000 mg | Freq: Once | INTRAMUSCULAR | Status: AC
  Administered 2017-11-06: 4 mg via INTRAVENOUS
  Filled 2017-11-06: qty 2

## 2017-11-06 MED ORDER — M.V.I. ADULT IV INJ
Freq: Once | INTRAVENOUS | Status: AC
  Administered 2017-11-06: 18:00:00 via INTRAVENOUS
  Filled 2017-11-06: qty 1000

## 2017-11-06 MED ORDER — DEXTROSE 5 % IN LACTATED RINGERS IV BOLUS
1000.0000 mL | Freq: Once | INTRAVENOUS | Status: AC
  Administered 2017-11-06: 1000 mL via INTRAVENOUS

## 2017-11-06 MED ORDER — FAMOTIDINE IN NACL 20-0.9 MG/50ML-% IV SOLN
20.0000 mg | Freq: Once | INTRAVENOUS | Status: AC
  Administered 2017-11-06: 20 mg via INTRAVENOUS
  Filled 2017-11-06: qty 50

## 2017-11-06 MED ORDER — POTASSIUM CHLORIDE CRYS ER 20 MEQ PO TBCR: 40.0000 meq | EXTENDED_RELEASE_TABLET | Freq: Two times a day (BID) | ORAL | 0 refills | Status: DC

## 2017-11-06 MED ORDER — DOCUSATE SODIUM 100 MG PO CAPS: 100.0000 mg | ORAL_CAPSULE | Freq: Two times a day (BID) | ORAL | 2 refills | Status: DC | PRN

## 2017-11-06 MED ORDER — CYCLOBENZAPRINE HCL 10 MG PO TABS: 5.0000 mg | ORAL_TABLET | Freq: Three times a day (TID) | ORAL | 0 refills | Status: DC | PRN

## 2017-11-06 MED ORDER — POTASSIUM CHLORIDE 10 MEQ/100ML IV SOLN
10.0000 meq | Freq: Once | INTRAVENOUS | Status: AC
  Administered 2017-11-06: 10 meq via INTRAVENOUS
  Filled 2017-11-06: qty 100

## 2017-11-06 MED ORDER — KETOROLAC TROMETHAMINE 30 MG/ML IJ SOLN
30.0000 mg | Freq: Once | INTRAMUSCULAR | Status: AC
  Administered 2017-11-06: 30 mg via INTRAVENOUS
  Filled 2017-11-06: qty 1

## 2017-11-06 NOTE — MAU Provider Note (Signed)
History     CSN: [id number]  Arrival date & time 11/06/17  1323   First Provider Initiated Contact with Patient 11/06/17 1525      Chief Complaint  Patient presents with  . Emesis  . Back Pain    Pt is a 22 yo G6P0050 at 5823w5d who presents with acute onset of low back pain and persistent N/V. The pain back is described as sharp, non-radiating pain that is not relieved or aggravated by anything. Pt has has N/V for the last few weeks. She has not noticed any blood in vomit. Pt denies any abd pain, vaginal bleeding, fever, headache, dizziness, dysuria or frequent urinary. Four days ago, pt was diagnosed with UTI after presenting with abd pain and N/V. She was given 2 bags of IV fluids, prescribed macrobid, phenergan for nausea, but has not been able to keep anything down.     Past Medical History:  Diagnosis Date  . Anxiety   . Anxiety and depression   . Asthma   . Birth defect   . Cyst of left kidney   . Depression   . Dysrhythmia    h/o SVT  . GERD (gastroesophageal reflux disease)   . Headache   . Hypoglycemia   . Lupus   . Miscarriage   . Palpitations     Past Surgical History:  Procedure Laterality Date  . WISDOM TOOTH EXTRACTION      Family History  Problem Relation Age of Onset  . Migraines Sister   . Hypertension Maternal Grandmother   . Skin cancer Maternal Grandmother   . Heart disease Maternal Grandfather     Social History   Tobacco Use  . Smoking status: Never Smoker  . Smokeless tobacco: Never Used  Substance Use Topics  . Alcohol use: No    Alcohol/week: 0.0 oz  . Drug use: No    OB History    Gravida Para Term Preterm AB Living   6 0     5     SAB TAB Ectopic Multiple Live Births   4              Review of Systems  Constitutional: Positive for appetite change. Negative for activity change and fever.  HENT: Negative for congestion.   Eyes: Negative for discharge and visual disturbance.  Respiratory: Negative for chest tightness,  shortness of breath and wheezing.   Cardiovascular: Negative for chest pain, palpitations and leg swelling.  Gastrointestinal: Positive for nausea and vomiting. Negative for abdominal distention, abdominal pain, constipation and diarrhea.  Genitourinary: Negative for difficulty urinating, dysuria, hematuria, vaginal bleeding and vaginal discharge.  Musculoskeletal: Positive for back pain. Negative for myalgias and neck stiffness.  Skin: Negative for color change and rash.  Neurological: Negative for dizziness, weakness, numbness and headaches.  Psychiatric/Behavioral: Negative for agitation.    Allergies  Pecan nut (diagnostic) and Reglan [metoclopramide]  Home Medications    BP 130/88 (BP Location: Right Arm)   Pulse (!) 104   Temp 98.5 F (36.9 C) (Oral)   Resp 18   Ht 5\' 3"  (1.6 m)   Wt 58.5 kg (129 lb)   LMP 06/23/2017   SpO2 100%   BMI 22.85 kg/m   Physical Exam  Constitutional: She is oriented to person, place, and time. She appears well-developed and well-nourished. No distress.  HENT:  Head: Normocephalic and atraumatic.  Eyes: Right eye exhibits no discharge. Left eye exhibits no discharge. No scleral icterus.  Neck: Normal range of motion.  Neck supple.  Cardiovascular: Normal rate, regular rhythm and normal heart sounds. Exam reveals no gallop and no friction rub.  No murmur heard. Pulmonary/Chest: Breath sounds normal. She has no wheezes. She exhibits no tenderness.  Abdominal: Soft. Bowel sounds are normal. She exhibits no distension. There is no tenderness. There is no rebound.  Musculoskeletal: She exhibits tenderness.  Tenderness of the lower back  Neurological: She is alert and oriented to person, place, and time.  Skin: Skin is warm and dry. She is not diaphoretic.  Psychiatric: She has a normal mood and affect.    MAU Course  EKG 12-Lead Date/Time: 11/06/2017 5:12 PM Performed by: Hurshel PartyLeftwich-Kirby, Nathanie Ottley A, CNM Authorized by: Tilda BurrowFerguson, John V, MD      (including critical care time)  Labs Reviewed  URINALYSIS, ROUTINE W REFLEX MICROSCOPIC - Abnormal; Notable for the following components:      Result Value   Color, Urine AMBER (*)    APPearance CLOUDY (*)    Bilirubin Urine SMALL (*)    Ketones, ur 80 (*)    Protein, ur 100 (*)    Leukocytes, UA SMALL (*)    Bacteria, UA MANY (*)    Squamous Epithelial / LPF TOO NUMEROUS TO COUNT (*)    All other components within normal limits  CBC  COMPREHENSIVE METABOLIC PANEL   Results for orders placed or performed during the hospital encounter of 11/06/17 (from the past 24 hour(s))  Urinalysis, Routine w reflex microscopic     Status: Abnormal   Collection Time: 11/06/17  2:02 PM  Result Value Ref Range   Color, Urine AMBER (A) YELLOW   APPearance CLOUDY (A) CLEAR   Specific Gravity, Urine 1.027 1.005 - 1.030   pH 5.0 5.0 - 8.0   Glucose, UA NEGATIVE NEGATIVE mg/dL   Hgb urine dipstick NEGATIVE NEGATIVE   Bilirubin Urine SMALL (A) NEGATIVE   Ketones, ur 80 (A) NEGATIVE mg/dL   Protein, ur 782100 (A) NEGATIVE mg/dL   Nitrite NEGATIVE NEGATIVE   Leukocytes, UA SMALL (A) NEGATIVE   RBC / HPF 6-30 0 - 5 RBC/hpf   WBC, UA TOO NUMEROUS TO COUNT 0 - 5 WBC/hpf   Bacteria, UA MANY (A) NONE SEEN   Squamous Epithelial / LPF TOO NUMEROUS TO COUNT (A) NONE SEEN   Mucus PRESENT   CBC     Status: Abnormal   Collection Time: 11/06/17  4:35 PM  Result Value Ref Range   WBC 8.2 4.0 - 10.5 K/uL   RBC 3.22 (L) 3.87 - 5.11 MIL/uL   Hemoglobin 9.1 (L) 12.0 - 15.0 g/dL   HCT 95.626.3 (L) 21.336.0 - 08.646.0 %   MCV 81.7 78.0 - 100.0 fL   MCH 28.3 26.0 - 34.0 pg   MCHC 34.6 30.0 - 36.0 g/dL   RDW 57.814.6 46.911.5 - 62.915.5 %   Platelets 132 (L) 150 - 400 K/uL  Comprehensive metabolic panel     Status: Abnormal   Collection Time: 11/06/17  4:35 PM  Result Value Ref Range   Sodium 134 (L) 135 - 145 mmol/L   Potassium 2.9 (L) 3.5 - 5.1 mmol/L   Chloride 105 101 - 111 mmol/L   CO2 19 (L) 22 - 32 mmol/L   Glucose, Bld 239  (H) 65 - 99 mg/dL   BUN 6 6 - 20 mg/dL   Creatinine, Ser 5.280.57 0.44 - 1.00 mg/dL   Calcium 8.3 (L) 8.9 - 10.3 mg/dL   Total Protein 6.1 (L) 6.5 - 8.1 g/dL  Albumin 3.0 (L) 3.5 - 5.0 g/dL   AST 34 15 - 41 U/L   ALT 33 14 - 54 U/L   Alkaline Phosphatase 58 38 - 126 U/L   Total Bilirubin 1.3 (H) 0.3 - 1.2 mg/dL   GFR calc non Af Amer >60 >60 mL/min   GFR calc Af Amer >60 >60 mL/min   Anion gap 10 5 - 15    No results found.    No diagnosis found.    MDM  Pt presents to MAU with n/v as main complaint with some constant back pain but became more uncomfortable and tearful with back pain while in MAU.  Pt reports history of back pain with injections outside of pregnancy.  Pain is sacral and lumbar, with no CVA tenderness on exam so likely musculoskeletal in nature.  Urine culture from pt previous MAU visit negative for UTI.  Pt started Macrobid as prescribed and this has exacerbated her n/v of pregnancy this week.  Toradol 30 mg IV given with fluid replacement for n/v and antiemetics.  D5LR x 1000 ml, MVI x 1000 ml, Zofran 4 mg IV, Pepcid 20 mg IV given. Pt tolerated PO food/fluids after fluids/meds.  Potassium 10 meq added as piggyback to fluids after lab results indicated hypokalemia. Will send pt home with Kdur to complete replacement.  Pt back pain improved after Toradol but requested something to try at home so Flexeril 5-10 mg PO prescribed and first dose given in MAU.  Pt to take Zofran at home as previously prescribed in addition to Phenergan suppositories.  She is to f/u as scheduled tomorrow at [facility] and return to MAU as needed for emergencies.  A: 1. Anemia during pregnancy in second trimester   2. Benign gestational thrombocytopenia in second trimester (HCC)   3. Monochorionic diamniotic twin gestation in second trimester   4. Nausea and vomiting during pregnancy prior to [redacted] weeks gestation   5. Back pain affecting pregnancy in second trimester     P: D/C  home   Allergies as of 11/06/2017      Reactions   Pecan Nut (diagnostic) Hives   Reglan [metoclopramide] Hives      Medication List    STOP taking these medications   nitrofurantoin (macrocrystal-monohydrate) 100 MG capsule Commonly known as:  MACROBID     TAKE these medications   cyclobenzaprine 10 MG tablet Commonly known as:  FLEXERIL Take 0.5-1 tablets (5-10 mg total) 3 (three) times daily as needed by mouth for muscle spasms.   docusate sodium 100 MG capsule Commonly known as:  COLACE Take 1 capsule (100 mg total) 2 (two) times daily as needed by mouth.   enoxaparin 40 MG/0.4ML injection Commonly known as:  LOVENOX Inject 0.4 mLs (40 mg total) daily into the skin.   FLINSTONES GUMMIES OMEGA-3 DHA PO Take 2 tablets by mouth daily. Takes 2 daily   Iron Polysacch Cmplx-B12-FA 150-0.025-1 MG Caps Take one capsule daily   potassium chloride SA 20 MEQ tablet Commonly known as:  K-DUR,KLOR-CON Take 2 tablets (40 mEq total) 2 (two) times daily for 3 days by mouth.   progesterone 200 MG capsule Commonly known as:  PROMETRIUM 1 capsule nightly at bedtime What changed:    how much to take  how to take this  when to take this  additional instructions   promethazine 12.5 MG suppository Commonly known as:  PHENERGAN Place 1 suppository (12.5 mg total) rectally every 6 (six) hours as needed for nausea or  vomiting.   VENTOLIN HFA 108 (90 Base) MCG/ACT inhaler Generic drug:  albuterol Inhale 2 puffs into the lungs every 6 (six) hours as needed for wheezing or shortness of breath.       Sharen Counter, CNM 7:27 PM

## 2017-11-06 NOTE — MAU Note (Signed)
Pt reports she was seen Saturday for a UTI, is unable to keep antibiotics down due to nausea and vomiting.

## 2017-11-06 NOTE — MAU Note (Signed)
Graham Cracker and Ginger ale given.  Patient tolerating well.

## 2017-11-07 ENCOUNTER — Encounter: Payer: Self-pay | Admitting: Obstetrics & Gynecology

## 2017-11-07 ENCOUNTER — Ambulatory Visit (INDEPENDENT_AMBULATORY_CARE_PROVIDER_SITE_OTHER): Payer: Commercial Managed Care - PPO | Admitting: Obstetrics & Gynecology

## 2017-11-07 ENCOUNTER — Ambulatory Visit (INDEPENDENT_AMBULATORY_CARE_PROVIDER_SITE_OTHER): Payer: Commercial Managed Care - PPO

## 2017-11-07 ENCOUNTER — Other Ambulatory Visit: Payer: Self-pay | Admitting: Obstetrics & Gynecology

## 2017-11-07 VITALS — BP 112/80 | HR 91 | Wt 134.5 lb

## 2017-11-07 DIAGNOSIS — O30032 Twin pregnancy, monochorionic/diamniotic, second trimester: Secondary | ICD-10-CM

## 2017-11-07 DIAGNOSIS — O2622 Pregnancy care for patient with recurrent pregnancy loss, second trimester: Secondary | ICD-10-CM

## 2017-11-07 DIAGNOSIS — O0992 Supervision of high risk pregnancy, unspecified, second trimester: Secondary | ICD-10-CM

## 2017-11-07 DIAGNOSIS — O211 Hyperemesis gravidarum with metabolic disturbance: Secondary | ICD-10-CM

## 2017-11-07 DIAGNOSIS — Z3A18 18 weeks gestation of pregnancy: Secondary | ICD-10-CM

## 2017-11-07 DIAGNOSIS — O43022 Fetus-to-fetus placental transfusion syndrome, second trimester: Secondary | ICD-10-CM

## 2017-11-07 DIAGNOSIS — Z331 Pregnant state, incidental: Secondary | ICD-10-CM

## 2017-11-07 DIAGNOSIS — Z1389 Encounter for screening for other disorder: Secondary | ICD-10-CM

## 2017-11-07 LAB — POCT URINALYSIS DIPSTICK
Blood, UA: NEGATIVE
Glucose, UA: NEGATIVE
KETONES UA: NEGATIVE
NITRITE UA: NEGATIVE
PROTEIN UA: NEGATIVE

## 2017-11-07 LAB — CULTURE, OB URINE: Culture: <10000 — AB

## 2017-11-07 NOTE — Progress Notes (Signed)
US 18+6 wks MO/DI twins,anterior pl gr 0,normal ovaries bilat,CX 3.2 cm, BABY A: cephalic inferior,fhr 136 bpm,svp of fluid 14 cm,EFW 273 g  BABY B: breech superior right,baby appears to be stuck on anterior wall of uterus,unable to visualize amnion separation between twins,bladder not visualized,limited view of heart and face,kidneys were visualized but limited view,fhr 138 bpm,discordance 29 %,efw 194 g,Dr Despina HiddenEure review images and discussed results w/pt.

## 2017-11-08 ENCOUNTER — Other Ambulatory Visit: Payer: Self-pay | Admitting: Obstetrics & Gynecology

## 2017-11-08 ENCOUNTER — Encounter (HOSPITAL_COMMUNITY): Payer: Self-pay

## 2017-11-08 ENCOUNTER — Ambulatory Visit (HOSPITAL_COMMUNITY)
Admission: RE | Admit: 2017-11-08 | Discharge: 2017-11-08 | Disposition: A | Discharge status: Home / Self Care | Payer: Medicaid Other | Source: Ambulatory Visit | Attending: Obstetrics & Gynecology | Admitting: Obstetrics & Gynecology

## 2017-11-08 DIAGNOSIS — Z363 Encounter for antenatal screening for malformations: Secondary | ICD-10-CM | POA: Diagnosis not present

## 2017-11-08 DIAGNOSIS — O321XX2 Maternal care for breech presentation, fetus 2: Secondary | ICD-10-CM | POA: Diagnosis not present

## 2017-11-08 DIAGNOSIS — O43022 Fetus-to-fetus placental transfusion syndrome, second trimester: Secondary | ICD-10-CM | POA: Insufficient documentation

## 2017-11-08 DIAGNOSIS — O402XX0 Polyhydramnios, second trimester, not applicable or unspecified: Secondary | ICD-10-CM | POA: Diagnosis not present

## 2017-11-08 DIAGNOSIS — O30032 Twin pregnancy, monochorionic/diamniotic, second trimester: Secondary | ICD-10-CM | POA: Insufficient documentation

## 2017-11-08 DIAGNOSIS — Z3A19 19 weeks gestation of pregnancy: Secondary | ICD-10-CM | POA: Insufficient documentation

## 2017-11-08 NOTE — Progress Notes (Signed)
HIGH-RISK PREGNANCY VISIT Patient name: Carol Bernard MRN [id number]  Date of birth: 1995-09-02 Chief Complaint:   High Risk Gestation (US today)  History of Present Illness:   Carol Bernard is a 22 y.o. 206P0050 female at 262w6d with an Estimated Date of Delivery: 04/04/18 being seen today for ongoing management of a high-risk pregnancy complicated by monoamniotic dichorionic twins, history of recurrent pregnancy loss (5), positive lupus anticoagulant on Lovenox 40 mg daily, and Prometrium 200 mg per vagina support, ongoing problems with hyperemesis gravidarum dehydration with specifically hypokalemia Today she reports nausea and vomiting.  . Vag. Bleeding: None.   . denies leaking of fluid.  Review of Systems:   Pertinent items are noted in HPI Denies abnormal vaginal discharge w/ itching/odor/irritation, headaches, visual changes, shortness of breath, chest pain, abdominal pain, severe nausea/vomiting, or problems with urination or bowel movements unless otherwise stated above. Pertinent History Reviewed:  Reviewed past medical,surgical, social, obstetrical and family history.  Reviewed problem list, medications and allergies. Physical Assessment:   Vitals:   11/07/17 1117  BP: 112/80  Pulse: 91  Weight: 134 lb 8 oz (61 kg)  Body mass index is 23.83 kg/m.           Physical Examination:   General appearance: alert, well appearing, and in no distress  Mental status: alert, oriented to person, place, and time  Skin: warm & dry   Extremities: Edema: None    Cardiovascular: normal heart rate noted  Respiratory: normal respiratory effort, no distress  Abdomen: gravid, soft, non-tender  Pelvic: Cervical exam deferred         Fetal Status: See sonogram report and today's note          Fetal Surveillance Testing today: Anatomy scan sonogram  Results for orders placed or performed in visit on 11/07/17 (from the past 24 hour(s))  POCT urinalysis dipstick   Collection Time: 11/07/17 11:18  AM  Result Value Ref Range   Color, UA     Clarity, UA     Glucose, UA neg    Bilirubin, UA     Ketones, UA neg    Spec Grav, UA  1.010 - 1.025   Blood, UA neg    pH, UA  5.0 - 8.0   Protein, UA neg    Urobilinogen, UA  0.2 or 1.0 E.U./dL   Nitrite, UA neg    Leukocytes, UA Trace (A) Negative    Assessment & Plan:  1) High-risk pregnancy G6P0050 at 269w0d with an Estimated Date of Delivery: 04/04/18   2) Mono/Di twins, unstable sonogram today reveals findings 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  Please see the full ultrasound report for details.  I discussed the case with Dr. Ezzard StandingNewman who is in MFM with [facility] and he agrees the patient needs urgent evaluation.  She will be [facility] for a repeat sonogram and management discussion.  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  Previous sonograms clearly reveal  an membrane between twin A and twin B but now there is polyhydramnios in twin A and twin B is 29% smaller than twin A.  Additionally there is very low fluid around twin B.  I discussed at length for greater than 30 minutes with Carol Bernard and the father the baby regarding these findings and possible management going forward.  They both understand the twin B is at risk for intrauterine fetal death and that the recipient twin becomes increasingly at risk if it happens to develop ascites pericardial  effusion and pleural effusions which would represent cardiac failure.   We talked in general terms about possible admission later in the pregnancy for prolonged stay. but certainly they know that everything is pretty much in the area up in the air at this point.  I spent at least 25 minutes counseling them regarding the findings going over images and discussing possible management plan.  Of course Carol Bernard is quite upset as I would expect but I think she has a pretty realistic picture of the possibilities of the pregnancy and outcomes at this point.  Certainly as her pregnancy progresses we will know more baseline the findings on ultrasound of twin A and twin B Reviewed: as above     Face to face time:  25 minutes  Greater than 50% of the visit time was spent in counseling and coordination of care with the patient.  The summary and outline of the counseling and care coordination is summarized in the note above.   All questions were answered.   Follow-up: Return for per MFM plan, none scheduled at this point.  Orders Placed This Encounter  Procedures  . POCT urinalysis dipstick   Harkirat Orozco H  11/08/2017 9:50 AM

## 2017-11-11 ENCOUNTER — Telehealth: Payer: Self-pay | Admitting: Obstetrics & Gynecology

## 2017-11-11 NOTE — Telephone Encounter (Signed)
Called patient and answered questions Will have physicians call me tomorrow  No follow up needed with the patient at this time, I handled questions and concerns

## 2017-11-13 ENCOUNTER — Other Ambulatory Visit (HOSPITAL_COMMUNITY): Payer: Self-pay

## 2017-11-14 HISTORY — PX: FETOSCOPIC LASER PHOTOCOAGULATION: SHX1625

## 2017-11-15 ENCOUNTER — Encounter: Payer: Self-pay | Admitting: Obstetrics & Gynecology

## 2017-11-18 ENCOUNTER — Encounter (HOSPITAL_COMMUNITY): Payer: Self-pay

## 2017-11-20 ENCOUNTER — Other Ambulatory Visit: Payer: Self-pay | Admitting: Obstetrics & Gynecology

## 2017-11-20 ENCOUNTER — Telehealth: Payer: Self-pay | Admitting: *Deleted

## 2017-11-20 ENCOUNTER — Encounter (HOSPITAL_COMMUNITY): Payer: Self-pay

## 2017-11-20 DIAGNOSIS — O30032 Twin pregnancy, monochorionic/diamniotic, second trimester: Secondary | ICD-10-CM

## 2017-11-20 DIAGNOSIS — O43022 Fetus-to-fetus placental transfusion syndrome, second trimester: Secondary | ICD-10-CM

## 2017-11-20 NOTE — Telephone Encounter (Signed)
MFM called requesting that ultrasound order be changed to us mfm ob followup instead of limited. Also needed order for additional gestation put in. Orders placed.

## 2017-11-25 ENCOUNTER — Other Ambulatory Visit: Payer: Self-pay

## 2017-11-25 ENCOUNTER — Ambulatory Visit (INDEPENDENT_AMBULATORY_CARE_PROVIDER_SITE_OTHER): Payer: Commercial Managed Care - PPO | Admitting: Obstetrics & Gynecology

## 2017-11-25 ENCOUNTER — Encounter: Payer: Self-pay | Admitting: Obstetrics & Gynecology

## 2017-11-25 VITALS — BP 140/86 | HR 102 | Wt 141.0 lb

## 2017-11-25 DIAGNOSIS — Z1389 Encounter for screening for other disorder: Secondary | ICD-10-CM

## 2017-11-25 DIAGNOSIS — O0992 Supervision of high risk pregnancy, unspecified, second trimester: Secondary | ICD-10-CM

## 2017-11-25 DIAGNOSIS — Z3A21 21 weeks gestation of pregnancy: Secondary | ICD-10-CM

## 2017-11-25 DIAGNOSIS — O43022 Fetus-to-fetus placental transfusion syndrome, second trimester: Secondary | ICD-10-CM

## 2017-11-25 DIAGNOSIS — O30032 Twin pregnancy, monochorionic/diamniotic, second trimester: Secondary | ICD-10-CM

## 2017-11-25 DIAGNOSIS — O132 Gestational [pregnancy-induced] hypertension without significant proteinuria, second trimester: Secondary | ICD-10-CM

## 2017-11-25 DIAGNOSIS — Z331 Pregnant state, incidental: Secondary | ICD-10-CM

## 2017-11-25 LAB — POCT URINALYSIS DIPSTICK
GLUCOSE UA: NEGATIVE
Ketones, UA: NEGATIVE
Leukocytes, UA: NEGATIVE
NITRITE UA: NEGATIVE
Protein, UA: NEGATIVE
RBC UA: NEGATIVE

## 2017-11-25 NOTE — Progress Notes (Signed)
   HIGH-RISK PREGNANCY VISIT Patient name: Carol Bernard MRN [id number]  Date of birth: [birth year] Chief Complaint:   High Risk Gestation  History of Present Illness:   Carol Bernard is a 22 y.o. G6P0050 female at 7666w3d with an Estimated Date of Delivery: 04/04/18 being seen today for ongoing management of a high-risk pregnancy complicated by Carol MassedMONO Di twins complicated by twin to twin transfusion.  Today she reports headache.  . Vag. Bleeding: None.   . denies leaking of fluid.  Review of Systems:   Pertinent items are noted in HPI Denies abnormal vaginal discharge w/ itching/odor/irritation, headaches, visual changes, shortness of breath, chest pain, abdominal pain, severe nausea/vomiting, or problems with urination or bowel movements unless otherwise stated above. Pertinent History Reviewed:  Reviewed past medical,surgical, social, obstetrical and family history.  Reviewed problem list, medications and allergies. Physical Assessment:   Vitals:   11/25/17 0959  BP: 140/86  Pulse: (!) 102  Weight: 141 lb (64 kg)  Body mass index is 24.98 kg/m.           Physical Examination:   General appearance: alert, well appearing, and in no distress  Mental status: alert, oriented to person, place, and time  Skin: warm & dry   Extremities: Edema: Trace    Cardiovascular: normal heart rate noted  Respiratory: normal respiratory effort, no distress  Abdomen: gravid, soft, non-tender  Pelvic: Cervical exam deferred         Fetal Status:          Fetal Surveillance Testing today: FHR x 2: 132 A//150 Twin B   No results found for this or any previous visit (from the past 24 hour(s)).  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 Going forward with Carol Bernard will be followed by maternal fetal medicine closely and of course also by Carol Bernard to the extent that maternal fetal medicine will use Carol Bernard as adjunct for interval visits between ultrasounds  3) elevated blood pressure, unstable This does not rise to the level of needing blood pressure medicine at this point Carol Bernard is already on a baby aspirin has been throughout the pregnancy as well at subcu Lovenox daily Her urine protein is negative Certainly we will keep a close eye on her blood pressure in the coming weeks a lot we can do about it we just sit back and say what is it and do labs and what not to figure out if it does cross threshold to see if this is when and if it becomes preeclampsia already  Labs/procedures today:   Treatment Plan:  Will follow up Carol Bernard here weekly and as requested  Reviewed: Preterm labor symptoms and general obstetric precautions including but not limited to vaginal bleeding, contractions, leaking of fluid and fetal movement were reviewed in detail with the patient.  All questions were answered.  Follow-up: Return in about 1 week (around 12/02/2017) for Follow up, with Dr Despina HiddenEure.  Orders Placed This Encounter  Procedures  . POCT urinalysis dipstick   Lazaro ArmsLuther H Darthy Manganelli  11/25/2017 10:35 AM

## 2017-11-27 ENCOUNTER — Encounter (HOSPITAL_COMMUNITY): Payer: Self-pay

## 2017-11-28 ENCOUNTER — Encounter (HOSPITAL_COMMUNITY): Payer: Self-pay

## 2017-11-28 ENCOUNTER — Other Ambulatory Visit: Payer: Self-pay | Admitting: Obstetrics & Gynecology

## 2017-11-28 ENCOUNTER — Ambulatory Visit (HOSPITAL_COMMUNITY)
Admission: RE | Admit: 2017-11-28 | Discharge: 2017-11-28 | Disposition: A | Discharge status: Home / Self Care | Payer: Medicaid Other | Source: Ambulatory Visit | Attending: Obstetrics & Gynecology | Admitting: Obstetrics & Gynecology

## 2017-11-28 DIAGNOSIS — O43022 Fetus-to-fetus placental transfusion syndrome, second trimester: Secondary | ICD-10-CM | POA: Diagnosis not present

## 2017-11-28 DIAGNOSIS — O321XX2 Maternal care for breech presentation, fetus 2: Secondary | ICD-10-CM | POA: Insufficient documentation

## 2017-11-28 DIAGNOSIS — M329 Systemic lupus erythematosus, unspecified: Secondary | ICD-10-CM | POA: Insufficient documentation

## 2017-11-28 DIAGNOSIS — Z3A21 21 weeks gestation of pregnancy: Secondary | ICD-10-CM

## 2017-11-28 DIAGNOSIS — O2622 Pregnancy care for patient with recurrent pregnancy loss, second trimester: Secondary | ICD-10-CM | POA: Insufficient documentation

## 2017-11-28 DIAGNOSIS — O30032 Twin pregnancy, monochorionic/diamniotic, second trimester: Secondary | ICD-10-CM

## 2017-11-28 DIAGNOSIS — Z3A22 22 weeks gestation of pregnancy: Secondary | ICD-10-CM | POA: Insufficient documentation

## 2017-11-28 DIAGNOSIS — O9989 Other specified diseases and conditions complicating pregnancy, childbirth and the puerperium: Secondary | ICD-10-CM | POA: Diagnosis not present

## 2017-11-29 ENCOUNTER — Other Ambulatory Visit (HOSPITAL_COMMUNITY): Payer: Self-pay | Admitting: *Deleted

## 2017-11-29 DIAGNOSIS — IMO0002 Reserved for concepts with insufficient information to code with codable children: Secondary | ICD-10-CM

## 2017-12-02 ENCOUNTER — Encounter: Payer: Self-pay | Admitting: Obstetrics & Gynecology

## 2017-12-02 ENCOUNTER — Ambulatory Visit (INDEPENDENT_AMBULATORY_CARE_PROVIDER_SITE_OTHER): Payer: Commercial Managed Care - PPO | Admitting: Obstetrics & Gynecology

## 2017-12-02 VITALS — BP 112/70 | HR 128 | Wt 139.0 lb

## 2017-12-02 DIAGNOSIS — Z3A22 22 weeks gestation of pregnancy: Secondary | ICD-10-CM

## 2017-12-02 DIAGNOSIS — O2622 Pregnancy care for patient with recurrent pregnancy loss, second trimester: Secondary | ICD-10-CM

## 2017-12-02 DIAGNOSIS — O43022 Fetus-to-fetus placental transfusion syndrome, second trimester: Secondary | ICD-10-CM

## 2017-12-02 DIAGNOSIS — Z331 Pregnant state, incidental: Secondary | ICD-10-CM

## 2017-12-02 DIAGNOSIS — O30032 Twin pregnancy, monochorionic/diamniotic, second trimester: Secondary | ICD-10-CM

## 2017-12-02 DIAGNOSIS — Z1389 Encounter for screening for other disorder: Secondary | ICD-10-CM

## 2017-12-02 LAB — POCT URINALYSIS DIPSTICK
Glucose, UA: NEGATIVE
Ketones, UA: NEGATIVE
Leukocytes, UA: NEGATIVE
NITRITE UA: NEGATIVE

## 2017-12-02 NOTE — Progress Notes (Signed)
Fetal Surveillance Testing today:  FHR x 2, 144/136   High Risk Pregnancy Diagnosis(es):   Mono Di twins with TTTS S/P laser photocoagulation therapy  G6P0050 8019w3d Estimated Date of Delivery: 04/04/18  Blood pressure 112/70, pulse (!) 128, weight 139 lb (63 kg), last menstrual period 06/23/2017.  Urinalysis: Negative   HPI: The patient is being seen today for ongoing management of as above. Today she reports SOB   BP weight and urine results all reviewed and noted. Patient reports good fetal movement, denies any bleeding and no rupture of membranes symptoms or regular contractions.  Fundal Height:  30 Fetal Heart rate:  144/136 Edema:  None Lungs clear  Patient is without complaints other than noted in her HPI. All questions were answered.  All lab and sonogram results have been reviewed. Comments:    Assessment:  1.  Pregnancy at 6719w3d,  Estimated Date of Delivery: 04/04/18 :                          2.  Mono Di twins                        3.  TTTS s/p photocoagulation therapy                         4.  Recurrent pregnancy loss with + LA  Medication(s) Plans:  No new, continue lovenox, prometrium aspirin therapy  Treatment Plan:  Weekly MFM sonograms + weekly visits here  Return in about 1 week (around 12/09/2017) for HROB, with Dr Despina HiddenEure. for appointment for high risk OB care  No orders of the defined types were placed in this encounter.  Orders Placed This Encounter  Procedures  . POCT urinalysis dipstick

## 2017-12-06 ENCOUNTER — Ambulatory Visit (HOSPITAL_COMMUNITY)
Admission: RE | Admit: 2017-12-06 | Discharge: 2017-12-06 | Disposition: A | Discharge status: Home / Self Care | Payer: Medicaid Other | Source: Ambulatory Visit | Attending: Obstetrics & Gynecology | Admitting: Obstetrics & Gynecology

## 2017-12-06 ENCOUNTER — Encounter (HOSPITAL_COMMUNITY): Payer: Self-pay

## 2017-12-06 ENCOUNTER — Other Ambulatory Visit (HOSPITAL_COMMUNITY): Payer: Self-pay | Admitting: *Deleted

## 2017-12-06 ENCOUNTER — Other Ambulatory Visit (HOSPITAL_COMMUNITY): Payer: Self-pay | Admitting: Maternal and Fetal Medicine

## 2017-12-06 DIAGNOSIS — O30032 Twin pregnancy, monochorionic/diamniotic, second trimester: Secondary | ICD-10-CM | POA: Diagnosis present

## 2017-12-06 DIAGNOSIS — IMO0002 Reserved for concepts with insufficient information to code with codable children: Secondary | ICD-10-CM

## 2017-12-06 DIAGNOSIS — O43022 Fetus-to-fetus placental transfusion syndrome, second trimester: Secondary | ICD-10-CM

## 2017-12-06 DIAGNOSIS — Z362 Encounter for other antenatal screening follow-up: Secondary | ICD-10-CM | POA: Diagnosis not present

## 2017-12-06 DIAGNOSIS — Z3A23 23 weeks gestation of pregnancy: Secondary | ICD-10-CM | POA: Diagnosis not present

## 2017-12-06 DIAGNOSIS — O365922 Maternal care for other known or suspected poor fetal growth, second trimester, fetus 2: Secondary | ICD-10-CM

## 2017-12-06 DIAGNOSIS — M329 Systemic lupus erythematosus, unspecified: Secondary | ICD-10-CM | POA: Insufficient documentation

## 2017-12-06 DIAGNOSIS — O2622 Pregnancy care for patient with recurrent pregnancy loss, second trimester: Secondary | ICD-10-CM | POA: Insufficient documentation

## 2017-12-06 DIAGNOSIS — O26892 Other specified pregnancy related conditions, second trimester: Secondary | ICD-10-CM | POA: Diagnosis not present

## 2017-12-06 NOTE — Addendum Note (Signed)
Encounter addended by: Lenoard AdenJohnson, Aliyana Dlugosz M, RDMS on: 12/06/2017 10:36 AM  Actions taken: Imaging Exam ended

## 2017-12-06 NOTE — Addendum Note (Signed)
Encounter addended by: Levonne HubertStalter, Petula Rotolo M, RDMS, RVT on: 12/06/2017 2:16 PM  Actions taken: Order list changed

## 2017-12-09 ENCOUNTER — Encounter: Payer: Commercial Managed Care - PPO | Admitting: Obstetrics & Gynecology

## 2017-12-12 ENCOUNTER — Ambulatory Visit (INDEPENDENT_AMBULATORY_CARE_PROVIDER_SITE_OTHER): Payer: Commercial Managed Care - PPO | Admitting: Obstetrics & Gynecology

## 2017-12-12 ENCOUNTER — Encounter: Payer: Self-pay | Admitting: Obstetrics & Gynecology

## 2017-12-12 VITALS — BP 118/62 | HR 126 | Wt 141.0 lb

## 2017-12-12 DIAGNOSIS — O99012 Anemia complicating pregnancy, second trimester: Secondary | ICD-10-CM

## 2017-12-12 DIAGNOSIS — O1212 Gestational proteinuria, second trimester: Secondary | ICD-10-CM | POA: Diagnosis not present

## 2017-12-12 DIAGNOSIS — O43022 Fetus-to-fetus placental transfusion syndrome, second trimester: Secondary | ICD-10-CM

## 2017-12-12 DIAGNOSIS — Z331 Pregnant state, incidental: Secondary | ICD-10-CM

## 2017-12-12 DIAGNOSIS — O30032 Twin pregnancy, monochorionic/diamniotic, second trimester: Secondary | ICD-10-CM | POA: Diagnosis not present

## 2017-12-12 DIAGNOSIS — O0992 Supervision of high risk pregnancy, unspecified, second trimester: Secondary | ICD-10-CM

## 2017-12-12 DIAGNOSIS — Z3A23 23 weeks gestation of pregnancy: Secondary | ICD-10-CM | POA: Diagnosis not present

## 2017-12-12 DIAGNOSIS — Z1389 Encounter for screening for other disorder: Secondary | ICD-10-CM

## 2017-12-12 LAB — POCT URINALYSIS DIPSTICK
Ketones, UA: NEGATIVE
LEUKOCYTES UA: NEGATIVE
Nitrite, UA: NEGATIVE

## 2017-12-12 LAB — POCT HEMOGLOBIN: HEMOGLOBIN: 7.7 g/dL — AB (ref 12.2–16.2)

## 2017-12-12 NOTE — Progress Notes (Signed)
[facility] PREGNANCY VISIT Patient name: Carol Bernard MRN [id number]  Date of birth: 07-24-1995 Chief Complaint:   Routine Prenatal Visit  History of Present Illness:   Carol Bernard is a 22 y.o. G15P0050 female at 43w6dwith an Estimated Date of Delivery: 04/04/18 being seen today for ongoing management of a high-risk pregnancy complicated by Mono di twins with TTTS s/p laser photocoagulation.  Today she reports headache and some visual scotomata. Contractions: Not present. Vag. Bleeding: None.  Movement: (!) Decreased. denies leaking of fluid.  Review of Systems:   Pertinent items are noted in HPI Denies abnormal vaginal discharge w/ itching/odor/irritation, headaches, visual changes, shortness of breath, chest pain, abdominal pain, severe nausea/vomiting, or problems with urination or bowel movements unless otherwise stated above. Pertinent History Reviewed:  Reviewed past medical,surgical, social, obstetrical and family history.  Reviewed problem list, medications and allergies. Physical Assessment:   Vitals:   12/12/17 1424  BP: 118/62  Pulse: (!) 126  Weight: 141 lb (64 kg)  Body mass index is 24.98 kg/m.           Physical Examination:   General appearance: alert, well appearing, and in no distress  Mental status: alert, oriented to person, place, and time  Skin: warm & dry   Extremities: Edema: Trace    Cardiovascular: normal heart rate noted  Respiratory: normal respiratory effort, no distress  Abdomen: gravid, soft, non-tender  Pelvic: Cervical exam deferred         Fetal Status:     Movement: (!) Decreased    Fetal Surveillance Testing today: none   Results for orders placed or performed in visit on 12/12/17 (from the past 24 hour(s))  POCT Urinalysis Dipstick   Collection Time: 12/12/17  2:29 PM  Result Value Ref Range   Color, UA     Clarity, UA     Glucose, UA trace    Bilirubin, UA     Ketones, UA neg    Spec Grav, UA  1.010 - 1.025   Blood, UA small    pH, UA  5.0 - 8.0   Protein, UA 3+    Urobilinogen, UA  0.2 or 1.0 E.U./dL   Nitrite, UA neg    Leukocytes, UA Negative Negative   Appearance     Odor    POCT hemoglobin   Collection Time: 12/12/17  2:41 PM  Result Value Ref Range   Hemoglobin 7.7 (A) 12.2 - 16.2 g/dL    Assessment & Plan:  1) High-risk pregnancy G6P0050 at 221w6dith an Estimated Date of Delivery: 04/04/18   2) Mono Di twins with TTTS s/p laser photocoagulation of 34 placental vessels, stable, now with AC FGR, 12% discrepancy  3) Anemia, unstable, increase iron to twice daily  4)  Proteinuria, unstable, check 24 hour urine  Labs/procedures today:  Orders Placed This Encounter  Procedures  . CBC  . Comp Met (CMET)  . Protein, urine, 24 hour  . POCT Urinalysis Dipstick  . POCT hemoglobin    Treatment Plan:  Collect 24 hour urine for baseline, continue aspitin and lovenox, increase iron to BID, sonogram with MFM tomorrow, BP is ok today, timing of betamethasone up to MFM  Reviewed: Preterm labor symptoms and general obstetric precautions including but not limited to vaginal bleeding, contractions, leaking of fluid and fetal movement were reviewed in detail with the patient.  All questions were answered.  Follow-up: Return in about 4 days (around 12/16/2017) [place] Husky Orders  Placed This Encounter  Procedures  . CBC  . Comp Met (CMET)  . Protein, urine, 24 hour  . POCT Urinalysis Dipstick  . POCT hemoglobin   Florian Buff MD 12/12/2017 3:32 PM

## 2017-12-13 ENCOUNTER — Encounter (HOSPITAL_COMMUNITY): Payer: Self-pay

## 2017-12-13 ENCOUNTER — Other Ambulatory Visit: Payer: Self-pay | Admitting: Obstetrics & Gynecology

## 2017-12-13 ENCOUNTER — Ambulatory Visit (HOSPITAL_COMMUNITY)
Admission: RE | Admit: 2017-12-13 | Discharge: 2017-12-13 | Disposition: A | Discharge status: Home / Self Care | Payer: Medicaid Other | Source: Ambulatory Visit | Attending: Obstetrics & Gynecology | Admitting: Obstetrics & Gynecology

## 2017-12-13 DIAGNOSIS — O30032 Twin pregnancy, monochorionic/diamniotic, second trimester: Secondary | ICD-10-CM | POA: Insufficient documentation

## 2017-12-13 DIAGNOSIS — Z3A24 24 weeks gestation of pregnancy: Secondary | ICD-10-CM

## 2017-12-13 DIAGNOSIS — O43022 Fetus-to-fetus placental transfusion syndrome, second trimester: Secondary | ICD-10-CM

## 2017-12-13 DIAGNOSIS — O2622 Pregnancy care for patient with recurrent pregnancy loss, second trimester: Secondary | ICD-10-CM | POA: Insufficient documentation

## 2017-12-13 DIAGNOSIS — O365922 Maternal care for other known or suspected poor fetal growth, second trimester, fetus 2: Secondary | ICD-10-CM

## 2017-12-13 LAB — COMPREHENSIVE METABOLIC PANEL
ALT: 7 IU/L (ref 0–32)
AST: 11 IU/L (ref 0–40)
Albumin/Globulin Ratio: 1.4 (ref 1.2–2.2)
Albumin: 3.7 g/dL (ref 3.5–5.5)
Alkaline Phosphatase: 160 IU/L — ABNORMAL HIGH (ref 39–117)
BUN/Creatinine Ratio: 13 (ref 9–23)
BUN: 6 mg/dL (ref 6–20)
Bilirubin Total: 0.9 mg/dL (ref 0.0–1.2)
CALCIUM: 9.1 mg/dL (ref 8.7–10.2)
CO2: 22 mmol/L (ref 20–29)
CREATININE: 0.47 mg/dL — AB (ref 0.57–1.00)
Chloride: 101 mmol/L (ref 96–106)
GFR calc Af Amer: 163 mL/min/{1.73_m2} (ref >59)
GFR, EST NON AFRICAN AMERICAN: 142 mL/min/{1.73_m2} (ref >59)
GLOBULIN, TOTAL: 2.6 g/dL (ref 1.5–4.5)
Glucose: 85 mg/dL (ref 65–99)
Potassium: 4.6 mmol/L (ref 3.5–5.2)
Sodium: 136 mmol/L (ref 134–144)
Total Protein: 6.3 g/dL (ref 6.0–8.5)

## 2017-12-13 LAB — CBC
HEMATOCRIT: 27.4 % — AB (ref 34.0–46.6)
Hemoglobin: 9.2 g/dL — ABNORMAL LOW (ref 11.1–15.9)
MCH: 28 pg (ref 26.6–33.0)
MCHC: 33.6 g/dL (ref 31.5–35.7)
MCV: 84 fL (ref 79–97)
PLATELETS: 207 10*3/uL (ref 150–379)
RBC: 3.28 x10E6/uL — ABNORMAL LOW (ref 3.77–5.28)
RDW: 16.4 % — AB (ref 12.3–15.4)
WBC: 16.5 10*3/uL — AB (ref 3.4–10.8)

## 2017-12-14 ENCOUNTER — Other Ambulatory Visit: Payer: Self-pay | Admitting: Obstetrics & Gynecology

## 2017-12-15 ENCOUNTER — Other Ambulatory Visit: Payer: Self-pay

## 2017-12-15 ENCOUNTER — Encounter (HOSPITAL_COMMUNITY): Payer: Self-pay | Admitting: Student

## 2017-12-15 ENCOUNTER — Inpatient Hospital Stay (HOSPITAL_COMMUNITY): Payer: Medicaid Other

## 2017-12-15 ENCOUNTER — Inpatient Hospital Stay (HOSPITAL_COMMUNITY)
Admission: AD | Admit: 2017-12-15 | Discharge: 2017-12-28 | DRG: 831 | Payer: Medicaid Other | Source: Ambulatory Visit | Attending: Obstetrics & Gynecology | Admitting: Obstetrics & Gynecology

## 2017-12-15 DIAGNOSIS — O368130 Decreased fetal movements, third trimester, not applicable or unspecified: Secondary | ICD-10-CM | POA: Diagnosis present

## 2017-12-15 DIAGNOSIS — O26872 Cervical shortening, second trimester: Secondary | ICD-10-CM

## 2017-12-15 DIAGNOSIS — K219 Gastro-esophageal reflux disease without esophagitis: Secondary | ICD-10-CM | POA: Diagnosis present

## 2017-12-15 DIAGNOSIS — O4290 Premature rupture of membranes, unspecified as to length of time between rupture and onset of labor, unspecified weeks of gestation: Secondary | ICD-10-CM

## 2017-12-15 DIAGNOSIS — O30033 Twin pregnancy, monochorionic/diamniotic, third trimester: Secondary | ICD-10-CM | POA: Diagnosis present

## 2017-12-15 DIAGNOSIS — O43029 Fetus-to-fetus placental transfusion syndrome, unspecified trimester: Secondary | ICD-10-CM

## 2017-12-15 DIAGNOSIS — N898 Other specified noninflammatory disorders of vagina: Secondary | ICD-10-CM

## 2017-12-15 DIAGNOSIS — D649 Anemia, unspecified: Secondary | ICD-10-CM | POA: Diagnosis present

## 2017-12-15 DIAGNOSIS — Z3A25 25 weeks gestation of pregnancy: Secondary | ICD-10-CM | POA: Diagnosis not present

## 2017-12-15 DIAGNOSIS — O43023 Fetus-to-fetus placental transfusion syndrome, third trimester: Secondary | ICD-10-CM | POA: Diagnosis present

## 2017-12-15 DIAGNOSIS — O2622 Pregnancy care for patient with recurrent pregnancy loss, second trimester: Secondary | ICD-10-CM | POA: Diagnosis present

## 2017-12-15 DIAGNOSIS — O42913 Preterm premature rupture of membranes, unspecified as to length of time between rupture and onset of labor, third trimester: Secondary | ICD-10-CM | POA: Diagnosis present

## 2017-12-15 DIAGNOSIS — O99613 Diseases of the digestive system complicating pregnancy, third trimester: Secondary | ICD-10-CM | POA: Diagnosis present

## 2017-12-15 DIAGNOSIS — Z3A24 24 weeks gestation of pregnancy: Secondary | ICD-10-CM | POA: Diagnosis not present

## 2017-12-15 DIAGNOSIS — Z888 Allergy status to other drugs, medicaments and biological substances status: Secondary | ICD-10-CM | POA: Diagnosis not present

## 2017-12-15 DIAGNOSIS — D6862 Lupus anticoagulant syndrome: Secondary | ICD-10-CM | POA: Diagnosis present

## 2017-12-15 DIAGNOSIS — O99113 Other diseases of the blood and blood-forming organs and certain disorders involving the immune mechanism complicating pregnancy, third trimester: Secondary | ICD-10-CM | POA: Diagnosis present

## 2017-12-15 DIAGNOSIS — O99012 Anemia complicating pregnancy, second trimester: Secondary | ICD-10-CM | POA: Diagnosis not present

## 2017-12-15 DIAGNOSIS — O26892 Other specified pregnancy related conditions, second trimester: Secondary | ICD-10-CM | POA: Diagnosis not present

## 2017-12-15 DIAGNOSIS — Z7982 Long term (current) use of aspirin: Secondary | ICD-10-CM | POA: Diagnosis not present

## 2017-12-15 DIAGNOSIS — Z79899 Other long term (current) drug therapy: Secondary | ICD-10-CM

## 2017-12-15 DIAGNOSIS — O4102X0 Oligohydramnios, second trimester, not applicable or unspecified: Secondary | ICD-10-CM | POA: Diagnosis present

## 2017-12-15 DIAGNOSIS — O4593 Premature separation of placenta, unspecified, third trimester: Secondary | ICD-10-CM | POA: Diagnosis present

## 2017-12-15 DIAGNOSIS — IMO0002 Reserved for concepts with insufficient information to code with codable children: Secondary | ICD-10-CM | POA: Diagnosis present

## 2017-12-15 DIAGNOSIS — O368120 Decreased fetal movements, second trimester, not applicable or unspecified: Secondary | ICD-10-CM

## 2017-12-15 DIAGNOSIS — O30009 Twin pregnancy, unspecified number of placenta and unspecified number of amniotic sacs, unspecified trimester: Secondary | ICD-10-CM

## 2017-12-15 DIAGNOSIS — N96 Recurrent pregnancy loss: Secondary | ICD-10-CM | POA: Diagnosis present

## 2017-12-15 DIAGNOSIS — O99019 Anemia complicating pregnancy, unspecified trimester: Secondary | ICD-10-CM | POA: Diagnosis present

## 2017-12-15 DIAGNOSIS — O99013 Anemia complicating pregnancy, third trimester: Secondary | ICD-10-CM | POA: Diagnosis present

## 2017-12-15 DIAGNOSIS — O321XX0 Maternal care for breech presentation, not applicable or unspecified: Secondary | ICD-10-CM | POA: Diagnosis present

## 2017-12-15 DIAGNOSIS — Z91018 Allergy to other foods: Secondary | ICD-10-CM

## 2017-12-15 DIAGNOSIS — O26873 Cervical shortening, third trimester: Secondary | ICD-10-CM | POA: Diagnosis present

## 2017-12-15 DIAGNOSIS — O208 Other hemorrhage in early pregnancy: Secondary | ICD-10-CM | POA: Diagnosis not present

## 2017-12-15 DIAGNOSIS — O30032 Twin pregnancy, monochorionic/diamniotic, second trimester: Secondary | ICD-10-CM | POA: Diagnosis not present

## 2017-12-15 DIAGNOSIS — O42919 Preterm premature rupture of membranes, unspecified as to length of time between rupture and onset of labor, unspecified trimester: Secondary | ICD-10-CM

## 2017-12-15 DIAGNOSIS — O42912 Preterm premature rupture of membranes, unspecified as to length of time between rupture and onset of labor, second trimester: Secondary | ICD-10-CM | POA: Diagnosis not present

## 2017-12-15 DIAGNOSIS — O99513 Diseases of the respiratory system complicating pregnancy, third trimester: Secondary | ICD-10-CM | POA: Diagnosis present

## 2017-12-15 DIAGNOSIS — J45909 Unspecified asthma, uncomplicated: Secondary | ICD-10-CM | POA: Diagnosis present

## 2017-12-15 DIAGNOSIS — O365929 Maternal care for other known or suspected poor fetal growth, second trimester, other fetus: Secondary | ICD-10-CM

## 2017-12-15 DIAGNOSIS — Z3A26 26 weeks gestation of pregnancy: Secondary | ICD-10-CM

## 2017-12-15 LAB — URINALYSIS, ROUTINE W REFLEX MICROSCOPIC
Bacteria, UA: NONE SEEN
Bilirubin Urine: NEGATIVE
GLUCOSE, UA: NEGATIVE mg/dL
KETONES UR: NEGATIVE mg/dL
NITRITE: NEGATIVE
PH: 8 (ref 5.0–8.0)
Protein, ur: 30 mg/dL — AB
Specific Gravity, Urine: 1.012 (ref 1.005–1.030)

## 2017-12-15 LAB — TYPE AND SCREEN
ABO/RH(D): A POS
Antibody Screen: NEGATIVE

## 2017-12-15 LAB — AMNISURE RUPTURE OF MEMBRANE (ROM) NOT AT ARMC: Amnisure ROM: POSITIVE

## 2017-12-15 LAB — CBC
HEMATOCRIT: 31.4 % — AB (ref 36.0–46.0)
HEMOGLOBIN: 9.9 g/dL — AB (ref 12.0–15.0)
MCH: 28 pg (ref 26.0–34.0)
MCHC: 31.5 g/dL (ref 30.0–36.0)
MCV: 88.7 fL (ref 78.0–100.0)
Platelets: 243 10*3/uL (ref 150–400)
RBC: 3.54 MIL/uL — ABNORMAL LOW (ref 3.87–5.11)
RDW: 16.1 % — AB (ref 11.5–15.5)
WBC: 16.8 10*3/uL — AB (ref 4.0–10.5)

## 2017-12-15 LAB — POCT FERN TEST: POCT Fern Test: NEGATIVE

## 2017-12-15 MED ORDER — ENOXAPARIN SODIUM 40 MG/0.4ML ~~LOC~~ SOLN
40.0000 mg | SUBCUTANEOUS | Status: DC
  Administered 2017-12-15 – 2017-12-19 (×5): 40 mg via SUBCUTANEOUS
  Filled 2017-12-15 (×6): qty 0.4

## 2017-12-15 MED ORDER — ZOLPIDEM TARTRATE 5 MG PO TABS
5.0000 mg | ORAL_TABLET | Freq: Every evening | ORAL | Status: DC | PRN
  Filled 2017-12-15: qty 1

## 2017-12-15 MED ORDER — CALCIUM CARBONATE ANTACID 500 MG PO CHEW
2.0000 | CHEWABLE_TABLET | ORAL | Status: DC | PRN
  Filled 2017-12-15: qty 2

## 2017-12-15 MED ORDER — ONDANSETRON HCL 4 MG/2ML IJ SOLN
4.0000 mg | Freq: Four times a day (QID) | INTRAMUSCULAR | Status: DC | PRN
  Administered 2017-12-15: 4 mg via INTRAVENOUS
  Filled 2017-12-15: qty 2

## 2017-12-15 MED ORDER — ACETAMINOPHEN 325 MG PO TABS
650.0000 mg | ORAL_TABLET | ORAL | Status: DC | PRN
  Administered 2017-12-22 – 2017-12-27 (×5): 650 mg via ORAL
  Filled 2017-12-15 (×5): qty 2

## 2017-12-15 MED ORDER — BETAMETHASONE SOD PHOS & ACET 6 (3-3) MG/ML IJ SUSP
12.0000 mg | INTRAMUSCULAR | Status: AC
  Administered 2017-12-15 – 2017-12-16 (×2): 12 mg via INTRAMUSCULAR
  Filled 2017-12-15 (×2): qty 2

## 2017-12-15 MED ORDER — ASPIRIN EC 81 MG PO TBEC
81.0000 mg | DELAYED_RELEASE_TABLET | Freq: Every day | ORAL | Status: DC
  Administered 2017-12-15 – 2017-12-28 (×14): 81 mg via ORAL
  Filled 2017-12-15 (×15): qty 1

## 2017-12-15 MED ORDER — AMOXICILLIN 500 MG PO CAPS
500.0000 mg | ORAL_CAPSULE | Freq: Three times a day (TID) | ORAL | Status: AC
  Administered 2017-12-17 – 2017-12-22 (×15): 500 mg via ORAL
  Filled 2017-12-15 (×15): qty 1

## 2017-12-15 MED ORDER — LACTATED RINGERS IV SOLN
INTRAVENOUS | Status: DC
  Administered 2017-12-15 – 2017-12-16 (×4): via INTRAVENOUS

## 2017-12-15 MED ORDER — DEXTROSE 5 % IV SOLN
500.0000 mg | INTRAVENOUS | Status: AC
  Administered 2017-12-15 – 2017-12-16 (×2): 500 mg via INTRAVENOUS
  Filled 2017-12-15 (×2): qty 500

## 2017-12-15 MED ORDER — SODIUM CHLORIDE 0.9 % IV SOLN
2.0000 g | Freq: Four times a day (QID) | INTRAVENOUS | Status: AC
  Administered 2017-12-15 – 2017-12-17 (×8): 2 g via INTRAVENOUS
  Filled 2017-12-15 (×8): qty 2000

## 2017-12-15 MED ORDER — PRENATAL MULTIVITAMIN CH
1.0000 | ORAL_TABLET | Freq: Every day | ORAL | Status: DC
  Administered 2017-12-16 – 2017-12-28 (×13): 1 via ORAL
  Filled 2017-12-15 (×13): qty 1

## 2017-12-15 MED ORDER — DOCUSATE SODIUM 100 MG PO CAPS
100.0000 mg | ORAL_CAPSULE | Freq: Every day | ORAL | Status: DC
  Administered 2017-12-20 – 2017-12-28 (×8): 100 mg via ORAL
  Filled 2017-12-15 (×11): qty 1

## 2017-12-15 MED ORDER — AZITHROMYCIN 250 MG PO TABS
500.0000 mg | ORAL_TABLET | Freq: Every day | ORAL | Status: AC
  Administered 2017-12-17 – 2017-12-21 (×5): 500 mg via ORAL
  Filled 2017-12-15 (×6): qty 2

## 2017-12-15 NOTE — MAU Provider Note (Signed)
Chief Complaint:  Possible Pregnancy and Decreased Fetal Movement   First Provider Initiated Contact with Patient 12/15/17 1211     HPI  HPI: Carol Bernard is a 22 y.o. G6P0050 at 8124w2dwho presents to maternity admissions reporting leaking of fluid & decreased fetal movement. Symptoms began overnight while she was sleeping. Reports waking up several times with wet underwear. Has not noticed gushes or trickling. Denies abdominal pain or vaginal bleeding. Decreased fetal movement yesterday & absent fetal movement this morning.  Denies recent intercourse.   Past Medical History: Past Medical History:  Diagnosis Date  . Anxiety and depression   . Asthma   . Birth defect   . Cyst of left kidney   . Dysrhythmia    h/o SVT  . GERD (gastroesophageal reflux disease)   . Headache   . Hypoglycemia   . Lupus   . Miscarriage   . Palpitations     Past obstetric history: OB History  Gravida Para Term Preterm AB Living  6 0     5 0  SAB TAB Ectopic Multiple Live Births  4            # Outcome Date GA Lbr Len/2nd Weight Sex Delivery Anes PTL Lv  6 Current           5 SAB 05/01/17          4 SAB 01/29/17          3 Molar           2 SAB           1 SAB               Past Surgical History: Past Surgical History:  Procedure Laterality Date  . DILATION AND CURETTAGE OF UTERUS N/A 01/29/2017   Procedure: SUCTION DILATATION AND CURETTAGE;  Surgeon: Tilda BurrowJohn Ferguson V, MD;  Location: [facility];  Service: Gynecology;  Laterality: N/A;  . DILATION AND CURETTAGE OF UTERUS N/A 05/01/2017   Procedure: SUCTION DILATATION AND CURETTAGE;  Surgeon: Lazaro ArmsLuther H Eure, MD;  Location: [facility];  Service: Gynecology;  Laterality: N/A;  pt to arrive at 7:15 to have labwork  . FETOSCOPIC LASER PHOTOCOAGULATION  11/14/2017   of 34 placental vessels for TTTS  . WISDOM TOOTH EXTRACTION      Family History: Family History  Problem Relation Age of Onset  . Migraines Sister   . Hypertension Maternal Grandmother   .  Skin cancer Maternal Grandmother   . Heart disease Maternal Grandfather     Social History: Social History   Tobacco Use  . Smoking status: Never Smoker  . Smokeless tobacco: Never Used  Substance Use Topics  . Alcohol use: No    Alcohol/week: 0.0 oz  . Drug use: No    Allergies:  Allergies  Allergen Reactions  . Pecan Nut (Diagnostic) Hives  . Reglan [Metoclopramide] Hives    Meds:  Medications Prior to Admission  Medication Sig Dispense Refill Last Dose  . albuterol (VENTOLIN HFA) 108 (90 Base) MCG/ACT inhaler Inhale 2 puffs into the lungs every 6 (six) hours as needed for wheezing or shortness of breath.    Taking  . aspirin EC 81 MG tablet Take 81 mg by mouth daily.   Taking  . cyclobenzaprine (FLEXERIL) 10 MG tablet Take 0.5-1 tablets (5-10 mg total) 3 (three) times daily as needed by mouth for muscle spasms. 30 tablet 0 Taking  . docusate sodium (COLACE) 100 MG capsule Take 1 capsule (  100 mg total) 2 (two) times daily as needed by mouth. 30 capsule 2 Taking  . enoxaparin (LOVENOX) 40 MG/0.4ML injection Inject 0.4 mLs (40 mg total) daily into the skin. 30 Syringe 4 Taking  . Iron Polysacch Cmplx-B12-FA 150-0.025-1 MG CAPS Take one capsule daily 30 each 2 Taking  . Pediatric Multiple Vit-C-FA (FLINSTONES GUMMIES OMEGA-3 DHA PO) Take 2 tablets by mouth daily. Takes 2 daily    Taking  . potassium chloride SA (K-DUR,KLOR-CON) 20 MEQ tablet Take 2 tablets (40 mEq total) 2 (two) times daily for 3 days by mouth. 6 tablet 0 Taking  . progesterone (PROMETRIUM) 200 MG capsule 1 capsule nightly at bedtime (Patient taking differently: Place 200 mg vaginally at bedtime. 1 capsule nightly at bedtime) 30 capsule 3 Taking  . promethazine (PHENERGAN) 12.5 MG suppository Place 1 suppository (12.5 mg total) rectally every 6 (six) hours as needed for nausea or vomiting. 24 each 6 Taking    I have reviewed patient's Past Medical Hx, Surgical Hx, Family Hx, Social Hx, medications and allergies.    ROS:  Review of Systems  Constitutional: Negative.   Cardiovascular: Negative for chest pain and palpitations.  Gastrointestinal: Negative.   Genitourinary: Positive for vaginal discharge. Negative for dysuria and vaginal bleeding.   Other systems negative  Physical Exam   Patient Vitals for the past 24 hrs:  BP Temp Temp src Pulse Resp SpO2 Height Weight  12/15/17 1218 - - - (!) 135 - 100 % - -  12/15/17 1209 - - - (!) 136 - 100 % - -  12/15/17 1149 123/70 97.7 F (36.5 C) Oral (!) 118 16 100 % 5' 3.5" (1.613 m) 138 lb (62.6 kg)   Constitutional: Well-developed, well-nourished female in no acute distress.  Cardiovascular: tachycardic, normal rhythm Respiratory: normal effort, clear to auscultation bilaterally GI: Abd soft, non-tender, gravid appropriate for gestational age.   No rebound or guarding. MS: Extremities nontender, no edema, normal ROM Neurologic: Alert and oriented x 4.  GU: Neg CVAT.  PELVIC EXAM: Small amount of clear watery/mucoid discharge, no pooling of fluid. Cervix pink, visually closed, without lesion, vaginal walls and external genitalia normal Bimanual exam: deferred    Fetal Tracing: Baby A Baseline: 145 Variability: moderate Accelerations: 10x10 Decelerations: small variables  Baby B Baseline: 140 Variability: moderate Accelerations:10x10 Decelerations: none  Toco: none    Labs: Results for orders placed or performed during the hospital encounter of 12/15/17 (from the past 24 hour(s))  Urinalysis, Routine w reflex microscopic     Status: Abnormal   Collection Time: 12/15/17 11:50 AM  Result Value Ref Range   Color, Urine YELLOW YELLOW   APPearance HAZY (A) CLEAR   Specific Gravity, Urine 1.012 1.005 - 1.030   pH 8.0 5.0 - 8.0   Glucose, UA NEGATIVE NEGATIVE mg/dL   Hgb urine dipstick SMALL (A) NEGATIVE   Bilirubin Urine NEGATIVE NEGATIVE   Ketones, ur NEGATIVE NEGATIVE mg/dL   Protein, ur 30 (A) NEGATIVE mg/dL   Nitrite NEGATIVE  NEGATIVE   Leukocytes, UA MODERATE (A) NEGATIVE   RBC / HPF 6-30 0 - 5 RBC/hpf   WBC, UA 6-30 0 - 5 WBC/hpf   Bacteria, UA NONE SEEN NONE SEEN   Squamous Epithelial / LPF 0-5 (A) NONE SEEN   Mucus PRESENT   Amnisure rupture of membrane (rom)not at [facility]     Status: None   Collection Time: 12/15/17 12:47 PM  Result Value Ref Range   Amnisure ROM POSITIVE   POCT  fern test     Status: None   Collection Time: 12/15/17 12:54 PM  Result Value Ref Range   POCT Fern Test Negative = intact amniotic membranes    A/Positive/-- (09/21 1416)  Imaging:    MAU Course/MDM: No pooling & fern negative. Amnisure positive.  Ultrasound ordered --- AFV subjectively normal & comparable to exam on 12/14. Cervical shortening noted, down to 1.9 from 2.6 on Friday.  C/w Dr. Debroah LoopArnold. Will come speak with patient    Assessment: 1. Cervical shortening affecting pregnancy in second trimester   2. [redacted] weeks gestation of pregnancy   3. Monochorionic diamniotic twin gestation in second trimester   4. Vaginal discharge during pregnancy in second trimester     Plan: Dr. Debroah LoopArnold on unit to discuss POC with patient  Judeth Hornrin Chinedu Agustin, FNP 12/15/2017 12:11 PM

## 2017-12-15 NOTE — H&P (Signed)
Expand All Collapse All       [] Hide copied text  [] Hover for details   Chief Complaint:  Possible Pregnancy and Decreased Fetal Movement   First Provider Initiated Contact with Patient 12/15/17 1211     HPI  HPI: Carol Bernard is a 22 y.o. G6P0050 at 3724w2dwho presents to maternity admissions reporting leaking of fluid & decreased fetal movement. Symptoms began overnight while she was sleeping. Reports waking up several times with wet underwear. Has not noticed gushes or trickling. Denies abdominal pain or vaginal bleeding. Decreased fetal movement yesterday & absent fetal movement this morning.  Denies recent intercourse.   Past Medical History:     Past Medical History:  Diagnosis Date  . Anxiety and depression   . Asthma   . Birth defect   . Cyst of left kidney   . Dysrhythmia    h/o SVT  . GERD (gastroesophageal reflux disease)   . Headache   . Hypoglycemia   . Lupus   . Miscarriage   . Palpitations     Past obstetric history:                 OB History  Gravida Para Term Preterm AB Living  6 0     5 0  SAB TAB Ectopic Multiple Live Births  4            # Outcome Date GA Lbr Len/2nd Weight Sex Delivery Anes PTL Lv  6 Current           5 SAB 05/01/17          4 SAB 01/29/17          3 Molar           2 SAB           1 SAB               Past Surgical History:      Past Surgical History:  Procedure Laterality Date  . DILATION AND CURETTAGE OF UTERUS N/A 01/29/2017   Procedure: SUCTION DILATATION AND CURETTAGE;  Surgeon: Tilda BurrowJohn Ferguson V, MD;  Location: [facility];  Service: Gynecology;  Laterality: N/A;  . DILATION AND CURETTAGE OF UTERUS N/A 05/01/2017   Procedure: SUCTION DILATATION AND CURETTAGE;  Surgeon: Lazaro ArmsLuther H Eure, MD;  Location: [facility];  Service: Gynecology;  Laterality: N/A;  pt to arrive at 7:15 to have labwork  . FETOSCOPIC LASER PHOTOCOAGULATION  11/14/2017   of 34  placental vessels for TTTS  . WISDOM TOOTH EXTRACTION      Family History:      Family History  Problem Relation Age of Onset  . Migraines Sister   . Hypertension Maternal Grandmother   . Skin cancer Maternal Grandmother   . Heart disease Maternal Grandfather     Social History: Social History        Tobacco Use  . Smoking status: Never Smoker  . Smokeless tobacco: Never Used  Substance Use Topics  . Alcohol use: No    Alcohol/week: 0.0 oz  . Drug use: No    Allergies:  Allergies  Allergen Reactions  . Pecan Nut (Diagnostic) Hives  . Reglan [Metoclopramide] Hives    Meds:         Medications Prior to Admission  Medication Sig Dispense Refill Last Dose  . albuterol (VENTOLIN HFA) 108 (90 Base) MCG/ACT inhaler Inhale 2 puffs into the lungs every 6 (six) hours as needed for wheezing or shortness  of breath.    Taking  . aspirin EC 81 MG tablet Take 81 mg by mouth daily.   Taking  . cyclobenzaprine (FLEXERIL) 10 MG tablet Take 0.5-1 tablets (5-10 mg total) 3 (three) times daily as needed by mouth for muscle spasms. 30 tablet 0 Taking  . docusate sodium (COLACE) 100 MG capsule Take 1 capsule (100 mg total) 2 (two) times daily as needed by mouth. 30 capsule 2 Taking  . enoxaparin (LOVENOX) 40 MG/0.4ML injection Inject 0.4 mLs (40 mg total) daily into the skin. 30 Syringe 4 Taking  . Iron Polysacch Cmplx-B12-FA 150-0.025-1 MG CAPS Take one capsule daily 30 each 2 Taking  . Pediatric Multiple Vit-C-FA (FLINSTONES GUMMIES OMEGA-3 DHA PO) Take 2 tablets by mouth daily. Takes 2 daily    Taking  . potassium chloride SA (K-DUR,KLOR-CON) 20 MEQ tablet Take 2 tablets (40 mEq total) 2 (two) times daily for 3 days by mouth. 6 tablet 0 Taking  . progesterone (PROMETRIUM) 200 MG capsule 1 capsule nightly at bedtime (Patient taking differently: Place 200 mg vaginally at bedtime. 1 capsule nightly at bedtime) 30 capsule 3 Taking  . promethazine (PHENERGAN) 12.5 MG  suppository Place 1 suppository (12.5 mg total) rectally every 6 (six) hours as needed for nausea or vomiting. 24 each 6 Taking    I have reviewed patient's Past Medical Hx, Surgical Hx, Family Hx, Social Hx, medications and allergies.   ROS:  Review of Systems  Constitutional: Negative.   Cardiovascular: Negative for chest pain and palpitations.  Gastrointestinal: Negative.   Genitourinary: Positive for vaginal discharge. Negative for dysuria and vaginal bleeding.   Other systems negative  Physical Exam   Patient Vitals for the past 24 hrs:  BP Temp Temp src Pulse Resp SpO2 Height Weight  12/15/17 1218 - - - (!) 135 - 100 % - -  12/15/17 1209 - - - (!) 136 - 100 % - -  12/15/17 1149 123/70 97.7 F (36.5 C) Oral (!) 118 16 100 % 5' 3.5" (1.613 m) 138 lb (62.6 kg)   Constitutional: Well-developed, well-nourished female in no acute distress.  Cardiovascular: tachycardic, normal rhythm Respiratory: normal effort, clear to auscultation bilaterally GI: Abd soft, non-tender, gravid appropriate for gestational age.   No rebound or guarding. MS: Extremities nontender, no edema, normal ROM Neurologic: Alert and oriented x 4.  GU: Neg CVAT.  PELVIC EXAM: Small amount of clear watery/mucoid discharge, no pooling of fluid. Cervix pink, visually closed, without lesion, vaginal walls and external genitalia normal Bimanual exam: deferred  Fetal Tracing: Baby A Baseline: 145 Variability: moderate Accelerations: 10x10 Decelerations: small variables  Baby B Baseline: 140 Variability: moderate Accelerations:10x10 Decelerations: none  Toco: none    Labs: LabResultsLast24Hours  Results for orders placed or performed during the hospital encounter of 12/15/17 (from the past 24 hour(s))  Urinalysis, Routine w reflex microscopic     Status: Abnormal   Collection Time: 12/15/17 11:50 AM  Result Value Ref Range   Color, Urine YELLOW YELLOW   APPearance HAZY (A) CLEAR     Specific Gravity, Urine 1.012 1.005 - 1.030   pH 8.0 5.0 - 8.0   Glucose, UA NEGATIVE NEGATIVE mg/dL   Hgb urine dipstick SMALL (A) NEGATIVE   Bilirubin Urine NEGATIVE NEGATIVE   Ketones, ur NEGATIVE NEGATIVE mg/dL   Protein, ur 30 (A) NEGATIVE mg/dL   Nitrite NEGATIVE NEGATIVE   Leukocytes, UA MODERATE (A) NEGATIVE   RBC / HPF 6-30 0 - 5 RBC/hpf   WBC, UA  6-30 0 - 5 WBC/hpf   Bacteria, UA NONE SEEN NONE SEEN   Squamous Epithelial / LPF 0-5 (A) NONE SEEN   Mucus PRESENT   [facility]     Status: None   Collection Time: 12/15/17 12:47 PM  Result Value Ref Range   Amnisure ROM POSITIVE   POCT fern test     Status: None   Collection Time: 12/15/17 12:54 PM  Result Value Ref Range   POCT Fern Test Negative = intact amniotic membranes      A/Positive/-- (09/21 1416)  Imaging:    MAU Course/MDM: No pooling & fern negative. Amnisure positive.  Ultrasound ordered --- AFV subjectively normal & comparable to exam on 12/14. Cervical shortening noted, down to 1.9 from 2.6 on Friday.  C/w Dr. Debroah LoopArnold. Will come speak with patient    Assessment: 1. Cervical shortening affecting pregnancy in second trimester   2. [redacted] weeks gestation of pregnancy   3. Monochorionic diamniotic twin gestation in second trimester   4. Vaginal discharge during pregnancy in second trimester     Plan: Dr. Debroah LoopArnold on unit to discuss POC with patient  Judeth Hornrin Lawrence, FNP 12/15/2017 12:11 PM     Attestation of Attending Supervision of Advanced Practitioner (CNM/NP/PA): Evaluation and management procedures were performed by the Advanced Practitioner under my supervision and collaboration. I have reviewed the Advanced Practitioner's note and chart, and I agree with the management and plan. I saw the patient and explained the current findings and plan  Scheryl DarterJames Cyniah Gossard MD

## 2017-12-15 NOTE — Progress Notes (Signed)
Labs and IV done for primary nurse.   1605:Pt to 3rd floor via wheelchair.

## 2017-12-15 NOTE — MAU Note (Signed)
Pt reports decreased fetal movement since yesterday, ? Leaking fluid since 055.

## 2017-12-16 ENCOUNTER — Encounter: Payer: Commercial Managed Care - PPO | Admitting: Obstetrics & Gynecology

## 2017-12-16 MED ORDER — ENOXAPARIN SODIUM 40 MG/0.4ML ~~LOC~~ SOLN: 40.0000 mg | SUBCUTANEOUS | Status: DC

## 2017-12-16 NOTE — Progress Notes (Signed)
Patient ID: Carol Bernard, female   DOB: 1995/12/17, 22 y.o.   MRN: [id number]  FACULTY PRACTICE ANTEPARTUM(COMPREHENSIVE) NOTE  Carol Bernard is a 22 y.o. G6P0050 at 2754w3d who is admitted for rupture of membranes.   Fetal presentation is unsure. Length of Stay:  1  Days  Subjective:  Patient reports the fetal movement as active. Patient reports uterine contraction  activity as rare. Patient reports  vaginal bleeding as none. Patient describes fluid per vagina as Clear.  Vitals:  Blood pressure 106/68, pulse 95, temperature 97.7 F (36.5 C), temperature source Oral, resp. rate 18, height 5' 3.5" (1.613 m), weight 62.6 kg (138 lb), last menstrual period 06/23/2017, SpO2 100 %. Physical Examination:  General appearance - alert, well appearing, and in no distress Heart - normal rate and regular rhythm Abdomen - soft, nontender, nondistended Fundal Height:  consistent with twins Cervical Exam: Not evaluated.  Extremities: extremities normal, atraumatic, no cyanosis or edema and Homans sign is negative, no sign of DVT with DTRs 2+ bilaterally Membranes:ruptured  Fetal Monitoring:     Fetal Heart Rate A  Mode External filed at 12/16/2017 0700  Baseline Rate (A) 135 bpm filed at 12/16/2017 0700  Variability 6-25 BPM filed at 12/16/2017 0700  Accelerations 10 x 10 filed at 12/16/2017 0700  Decelerations None filed at 12/16/2017 0700  Multiple birth? Y filed at 12/15/2017 1200  Fetal Heart Rate Fetus B  Mode External filed at 12/16/2017 0700  Baseline Rate (B) 130 BPM filed at 12/16/2017 0700  Variability 6-25 BPM filed at 12/16/2017 0700  Accelerations 10 x 10 filed at 12/16/2017 0700  Decelerations None filed at 12/16/2017 0700     Labs:  Results for orders placed or performed during the hospital encounter of 12/15/17 (from the past 24 hour(s))  Urinalysis, Routine w reflex microscopic   Collection Time: 12/15/17 11:50 AM  Result Value Ref Range   Color, Urine YELLOW YELLOW   APPearance HAZY (A) CLEAR   Specific Gravity, Urine 1.012 1.005 - 1.030   pH 8.0 5.0 - 8.0   Glucose, UA NEGATIVE NEGATIVE mg/dL   Hgb urine dipstick SMALL (A) NEGATIVE   Bilirubin Urine NEGATIVE NEGATIVE   Ketones, ur NEGATIVE NEGATIVE mg/dL   Protein, ur 30 (A) NEGATIVE mg/dL   Nitrite NEGATIVE NEGATIVE   Leukocytes, UA MODERATE (A) NEGATIVE   RBC / HPF 6-30 0 - 5 RBC/hpf   WBC, UA 6-30 0 - 5 WBC/hpf   Bacteria, UA NONE SEEN NONE SEEN   Squamous Epithelial / LPF 0-5 (A) NONE SEEN   Mucus PRESENT   [facility]   Collection Time: 12/15/17 12:47 PM  Result Value Ref Range   Amnisure ROM POSITIVE   POCT fern test   Collection Time: 12/15/17 12:54 PM  Result Value Ref Range   POCT Fern Test Negative = intact amniotic membranes   CBC   Collection Time: 12/15/17  3:55 PM  Result Value Ref Range   WBC 16.8 (H) 4.0 - 10.5 K/uL   RBC 3.54 (L) 3.87 - 5.11 MIL/uL   Hemoglobin 9.9 (L) 12.0 - 15.0 g/dL   HCT 40.931.4 (L) 81.136.0 - 91.446.0 %   MCV 88.7 78.0 - 100.0 fL   MCH 28.0 26.0 - 34.0 pg   MCHC 31.5 30.0 - 36.0 g/dL   RDW 78.216.1 (H) 95.611.5 - 21.315.5 %   Platelets 243 150 - 400 K/uL  Type and [facility]   Collection  Time: 12/15/17  3:55 PM  Result Value Ref Range   ABO/RH(D) A POS    Antibody Screen NEG    Sample Expiration 12/18/2017       Medications:  Scheduled . [START ON 12/17/2017] amoxicillin  500 mg Oral Q8H  . aspirin EC  81 mg Oral Daily  . [START ON 12/17/2017] azithromycin  500 mg Oral Daily  . betamethasone acetate-betamethasone sodium phosphate  12 mg Intramuscular Q24H  . docusate sodium  100 mg Oral Daily  . enoxaparin  40 mg Subcutaneous Q24H  . prenatal multivitamin  1 tablet Oral Q1200   I have reviewed the patient's current medications.  ASSESSMENT: Patient Active Problem List   Diagnosis Date Noted  . Preterm premature rupture of membranes (PPROM) with unknown onset of labor 12/15/2017  . Anemia in  pregnancy 08/17/2017  . Gestational thrombocytopenia (HCC) 08/17/2017  . Nausea and vomiting of pregnancy, antepartum 08/16/2017  . History of multiple miscarriages 03/25/2017  . Burning with urination 03/25/2017  . Supervision of high-risk pregnancy 03/25/2017    PLAN: Antibiotic for PPROM Follow for s/sx of PTL or infection  Scheryl DarterJames Brynne Doane 12/16/2017,7:50 AM

## 2017-12-17 DIAGNOSIS — O26892 Other specified pregnancy related conditions, second trimester: Secondary | ICD-10-CM

## 2017-12-17 DIAGNOSIS — O30032 Twin pregnancy, monochorionic/diamniotic, second trimester: Secondary | ICD-10-CM

## 2017-12-17 DIAGNOSIS — N898 Other specified noninflammatory disorders of vagina: Secondary | ICD-10-CM

## 2017-12-17 DIAGNOSIS — O26872 Cervical shortening, second trimester: Secondary | ICD-10-CM

## 2017-12-17 LAB — PROTEIN, URINE, 24 HOUR
PROTEIN 24H UR: 728 mg/(24.h) — AB (ref 30–150)
Protein, Ur: 29.1 mg/dL

## 2017-12-17 MED ORDER — SODIUM CHLORIDE 0.9% FLUSH
3.0000 mL | Freq: Two times a day (BID) | INTRAVENOUS | Status: DC
  Administered 2017-12-17 – 2017-12-28 (×21): 3 mL via INTRAVENOUS

## 2017-12-17 MED ORDER — SODIUM CHLORIDE 0.9% FLUSH: 3.0000 mL | INTRAVENOUS | Status: DC | PRN

## 2017-12-17 NOTE — Progress Notes (Signed)
Patient ID: Carol Bernard, female   DOB: [birth year], 22 y.o.   MRN: [id number] ACULT[facility] PRACTICE ANTEPARTUM COMPREHENSIVE PROGRESS NOTE  Carol Bernard is a 22 y.o. G6P0050 at 1481w4d  who is admitted for PROM, Twin A Fetal presentation is cephalic/breech Length of Stay:  2  Days  Subjective: Pt without complaints this morning. Tolerating diet. Denies vaginal bleeding or ut ctx. Still some LOF.  + FM X 2   Vitals:  Blood pressure 108/60, pulse 93, temperature 98.8 F (37.1 C), temperature source Oral, resp. rate 17, height 5' 3.5" (1.613 m), weight 62.5 kg (137 lb 12 oz), last menstrual period 06/23/2017, SpO2 99 %.    Physical Examination: Lungs clear  Heart RRR Abd soft + BS gravid non tender Ext non tender  Fetal Monitoring:  120-130's x 2, + accels   Labs:  No results found for this or any previous visit (from the past 24 hour(s)).  Imaging Studies:    none   Medications:  Scheduled . amoxicillin  500 mg Oral Q8H  . aspirin EC  81 mg Oral Daily  . azithromycin  500 mg Oral Daily  . docusate sodium  100 mg Oral Daily  . enoxaparin  40 mg Subcutaneous Q24H  . prenatal multivitamin  1 tablet Oral Q1200   I have reviewed the patient's current medications.  ASSESSMENT: IUP 24 4/7 weeks Mono/Di Twins      Vertex/Breech TTTS      S/P laser photocoagulation on 11/14/17       U/S for growth Friday PROM Twin A       S/P BMX       Continue with latency antibiotics  Poor OB history        Continue with Lovenox      PLAN: No S/Sx of infection presently. POC reviewed with pt and FOB. Delivery for maternal and fetal indications. Continue routine antenatal care.   Hermina StaggersMichael L Arabia Nylund 12/17/2017,10:31 AM

## 2017-12-18 DIAGNOSIS — O30032 Twin pregnancy, monochorionic/diamniotic, second trimester: Secondary | ICD-10-CM | POA: Diagnosis present

## 2017-12-18 DIAGNOSIS — N96 Recurrent pregnancy loss: Secondary | ICD-10-CM

## 2017-12-18 DIAGNOSIS — IMO0002 Reserved for concepts with insufficient information to code with codable children: Secondary | ICD-10-CM | POA: Diagnosis present

## 2017-12-18 DIAGNOSIS — O99012 Anemia complicating pregnancy, second trimester: Secondary | ICD-10-CM

## 2017-12-18 LAB — TYPE AND SCREEN
ABO/RH(D): A POS
ANTIBODY SCREEN: NEGATIVE

## 2017-12-18 MED ORDER — FERROUS SULFATE 325 (65 FE) MG PO TABS
325.0000 mg | ORAL_TABLET | Freq: Three times a day (TID) | ORAL | Status: DC
  Administered 2017-12-18 – 2017-12-20 (×9): 325 mg via ORAL
  Filled 2017-12-18 (×9): qty 1

## 2017-12-18 NOTE — Progress Notes (Addendum)
Patient ID: Carol Bernard, female   DOB: February 24, 1995, 22 y.o.   MRN: [id number]  FACULTY PRACTICE ANTEPARTUM NOTE  Carol Bernard is a 22 y.o. G6P0050 at 5970w5d  who is admitted for PPROM.   Fetal presentation is vtx/breech. Length of Stay:  3  Days  Subjective: Patient without complaint. Having small amount of leaking fluid. No abdominal tenderness, fevers, nausea, vomiting. Patient reports good fetal movement.   She reports no uterine contractions She reports no bleeding  She reports loss of fluid per vagina.  Vitals:  Blood pressure 119/64, pulse 100, temperature 98 F (36.7 C), temperature source Oral, resp. rate 16, height 5' 3.5" (1.613 m), weight 137 lb 12 oz (62.5 kg), last menstrual period 06/23/2017, SpO2 99 %. Physical Examination:  General appearance - alert, well appearing, and in no distress Chest - clear to auscultation, no wheezes, rales or rhonchi, symmetric air entry Heart - normal rate, regular rhythm, normal S1, S2, no murmurs, rubs, clicks or gallops Abdomen - soft, nontender, nondistended, no masses or organomegaly Fundal Height:  consistent with twins Extremities: extremities normal, atraumatic, no cyanosis or edema and Homans sign is negative, no sign of DVT  Membranes:ruptured  Fetal Monitoring:  Baseline: 140s-150s bpm, Variability: Good {> 6 bpm), Accelerations: Non-reactive but appropriate for gestational age and Decelerations: Absent  Labs:  No results found for this or any previous visit (from the past 24 hour(s)).  Imaging Studies:       Medications:  Scheduled . amoxicillin  500 mg Oral Q8H  . aspirin EC  81 mg Oral Daily  . azithromycin  500 mg Oral Daily  . docusate sodium  100 mg Oral Daily  . enoxaparin  40 mg Subcutaneous Q24H  . prenatal multivitamin  1 tablet Oral Q1200  . sodium chloride flush  3 mL Intravenous Q12H   I have reviewed the patient's current medications. Scheduled: . amoxicillin  500 mg Oral Q8H  . aspirin EC  81 mg Oral  Daily  . azithromycin  500 mg Oral Daily  . docusate sodium  100 mg Oral Daily  . enoxaparin  40 mg Subcutaneous Q24H  . ferrous sulfate  325 mg Oral TID WC  . prenatal multivitamin  1 tablet Oral Q1200  . 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 PPROM  S/p IV antibiotics  Continue PO meds  No evidence of chorio  NST reactive x6 2. Mono/Di Twins  Cephalic/breech presentation 3. TTTS, s/p laser photocoagulation  Last growth US on 12/7: Baby A 20th %tile, Baby B 11th %tile (12% discordance), normal dopplers  Repeat dopplers on Friday 4. Anemia in pregnancy  Last hemoglobin 9.9  Start ferrous sulfate TID 5. Hx of multiple miscarriages  Continue lovenox 40mg  daily   Continue routine antenatal care.   Levie HeritageStinson, Montrel Donahoe J, DO 12/18/2017,7:36 AM

## 2017-12-18 NOTE — Progress Notes (Signed)
Pt states she feels like she may be losing her mucous plug.  She stated when she voided 30 minutes ago she had mucous discharge.  Pt states she flushed the discharge.  RN encouraged patient to call for RN next time she voids/experiences discharge.  No discharge upon assessment.

## 2017-12-19 MED ORDER — ENSURE ENLIVE PO LIQD
237.0000 mL | Freq: Two times a day (BID) | ORAL | Status: DC
  Administered 2017-12-20 – 2017-12-28 (×11): 237 mL via ORAL
  Filled 2017-12-19 (×18): qty 237

## 2017-12-19 NOTE — Progress Notes (Signed)
Patient ID: Carol Bernard, female   DOB: [birth year], 22 y.o.   MRN: [id number] [facility] PRACTICE ANTEPARTUM COMPREHENSIVE PROGRESS NOTE  Carol Bernard is a 22 y.o. G6P0050 at 6159w6d  who is admitted for PROM, Tw A Fetal presentation is cephalic/breech Length of Stay:  4  Days  Subjective: Pt reports still LOF but no vaginal bleeding. No Uc tx or cramps. + FM x 2 Tolerating diet. Up to restroom without problems.  . Vitals:  Blood pressure (!) 105/55, pulse 93, temperature 98.5 F (36.9 C), temperature source Oral, resp. rate 16, height 5' 3.5" (1.613 m), weight 62.3 kg (137 lb 4 oz), last menstrual period 06/23/2017, SpO2 100 %.   Physical Examination: Lungs clear Heart RRR Abd soft + BS gravid non tender Ext non tender  Fetal Monitoring:  120-130's, reasuring for GA  Labs:  Results for orders placed or performed during the hospital encounter of 12/15/17 (from the past 24 hour(s))  [facility] and screen [facility]   Collection Time: 12/18/17  6:04 PM  Result Value Ref Range   ABO/RH(D) A POS    Antibody Screen NEG    Sample Expiration 12/21/2017     Imaging Studies:    none   Medications:  Scheduled . amoxicillin  500 mg Oral Q8H  . aspirin EC  81 mg Oral Daily  . azithromycin  500 mg Oral Daily  . docusate sodium  100 mg Oral Daily  . enoxaparin  40 mg Subcutaneous Q24H  . ferrous sulfate  325 mg Oral TID WC  . prenatal multivitamin  1 tablet Oral Q1200  . sodium chloride flush  3 mL Intravenous Q12H   I have reviewed the patient's current medications.  ASSESSMENT: IUP 24 6/7 weeks Twin Gestation, Mono/Di PPROM, Twin A  Continue with antiobiotics  S/P BMZ TTTS s/p laser photocoagulation      U/S tomorrow Anemia  Continue with iron supplement Hx of multiple miscarriages    Continue with Lovenox   PLAN: Stable. No S/Sx of infection. Delivery for maternal or fetal indications or at 34 weeks Continue routine antenatal care.   Hermina StaggersMichael L  [name] 12/19/2017,11:16 AM

## 2017-12-19 NOTE — Plan of Care (Signed)
  Progressing Skin Integrity: Risk for impaired skin integrity will decrease 12/19/2017 0155 - Progressing by Pietro CassisHollis, Dmauri Rosenow A, RN Note Skin is intact.  Pain Management: Relief or control of pain will improve 12/19/2017 0155 - Progressing by Pietro CassisHollis, Winn Muehl A, RN Note Pt denies pain

## 2017-12-20 ENCOUNTER — Inpatient Hospital Stay (HOSPITAL_COMMUNITY): Payer: Medicaid Other

## 2017-12-20 ENCOUNTER — Ambulatory Visit (HOSPITAL_COMMUNITY): Payer: Medicaid Other

## 2017-12-20 MED ORDER — ENOXAPARIN SODIUM 40 MG/0.4ML ~~LOC~~ SOLN
40.0000 mg | SUBCUTANEOUS | Status: DC
  Administered 2017-12-20 – 2017-12-27 (×8): 40 mg via SUBCUTANEOUS
  Filled 2017-12-20 (×9): qty 0.4

## 2017-12-20 MED ORDER — SODIUM CHLORIDE 0.9 % IV SOLN
510.0000 mg | INTRAVENOUS | Status: DC
  Administered 2017-12-20: 510 mg via INTRAVENOUS
  Filled 2017-12-20 (×2): qty 17

## 2017-12-20 NOTE — Progress Notes (Signed)
Patient ID: Carol Bernard, female   DOB: [birth year], 22 y.o.   MRN: [id number] [facility] PRACTICE ANTEPARTUM COMPREHENSIVE PROGRESS NOTE  Carol Bernard is a 22 y.o. G6P0050 at 5215w0d  who is admitted for PROM Twin A.  Fetal presentation is cephalic/cephalic Length of Stay:  5  Days  Subjective: Pt denies any LOF or vaginal bleeding. + FM. Tolerating diet.   Vitals:  Blood pressure 115/72, pulse (!) 101, temperature 98.4 F (36.9 C), temperature source Oral, resp. rate 18, height 5' 3.5" (1.613 m), weight 62 kg (136 lb 12 oz), last menstrual period 06/23/2017, SpO2 100 %.   Physical Examination: Lungs clear Heart RRR Abd soft + BS gravid non tender Ext non tender  Fetal Monitoring:  130's -140's x 2, good LTV  Labs:  No results found for this or any previous visit (from the past 24 hour(s)).  Imaging Studies:    Doppler studies   Medications:  Scheduled . amoxicillin  500 mg Oral Q8H  . aspirin EC  81 mg Oral Daily  . azithromycin  500 mg Oral Daily  . docusate sodium  100 mg Oral Daily  . enoxaparin  40 mg Subcutaneous Q24H  . feeding supplement (ENSURE ENLIVE)  237 mL Oral BID BM  . ferrous sulfate  325 mg Oral TID WC  . prenatal multivitamin  1 tablet Oral Q1200  . sodium chloride flush  3 mL Intravenous Q12H   I have reviewed the patient's current medications.  ASSESSMENT: IUP 24 6/7 weeks Twin gestation, Mono/Di PPROM, Twin A Oligo Twin B Elevated doppler Twin B Anemia Hx of multiple miscarriages  PLAN: Continue routine antenatal care.   Hermina StaggersMichael L Kahleah Crass 12/20/2017,1:12 PM

## 2017-12-20 NOTE — Progress Notes (Signed)
Nutrition  Consult for pts weight loss.  Pt reports good appetite, consumes 3 meals that are as described very adequate. No nausea or vomiting.  Ensure Enlive BID has been ordered by the RN  Snack order TID and double protein portions ordered by RD Pt understands how to order snacks   Elisabeth CaraKatherine Aaleah Hirsch M.Odis LusterEd. R.D. LDN Neonatal Nutrition Support Specialist/RD III Pager [phone]      Phone [phone]

## 2017-12-20 NOTE — Progress Notes (Signed)
Pt in WC to MFM.

## 2017-12-21 DIAGNOSIS — O42919 Preterm premature rupture of membranes, unspecified as to length of time between rupture and onset of labor, unspecified trimester: Secondary | ICD-10-CM

## 2017-12-21 LAB — TYPE AND SCREEN
ABO/RH(D): A POS
ANTIBODY SCREEN: NEGATIVE

## 2017-12-21 NOTE — Progress Notes (Signed)
Patient ID: Carol Bernard, female   DOB: 1995/07/22, 22 y.o.   MRN: [id number] FACULTY PRACTICE ANTEPARTUM(COMPREHENSIVE) NOTE  Carol Bernard is a 22 y.o. [phone] with Estimated Date of Delivery: 04/04/18   By  early ultrasound [redacted]w[redacted]d  who is admitted for PROM.  Mono Di twins  Fetal presentation is cephalic./cephalic Length of Stay:  6  Days  Date of admission:12/15/2017  Subjective:  Patient reports the fetal movement as pt in general cannot feel fetal movement. Patient reports uterine contraction  activity as none. Patient reports  vaginal bleeding as none. Patient describes fluid per vagina as Clear.  Vitals:  Blood pressure 117/73, pulse (!) 105, temperature 98.3 F (36.8 C), temperature source Oral, resp. rate 17, height 5' 3.5" (1.613 m), weight 138 lb (62.6 kg), last menstrual period 06/23/2017, SpO2 99 %. Vitals:   12/20/17 1550 12/20/17 2035 12/21/17 0020 12/21/17 0500  BP: 118/60 127/73 (!) 106/55 117/73  Pulse: (!) 104 98 (!) 105 (!) 105  Resp: 18 18 16 17   Temp: 98.6 F (37 C) 98.4 F (36.9 C) 98 F (36.7 C) 98.3 F (36.8 C)  TempSrc: Oral Oral Axillary Oral  SpO2: 100% 100% 99% 99%  Weight:    138 lb (62.6 kg)  Height:       Physical Examination:  General appearance - alert, well appearing, and in no distress Abdomen - soft, nontender, nondistended, no masses or organomegaly Fundal Height:  size equals dates Pelvic Exam:  normal external genitalia, vulva, vagina, cervix, uterus and adnexa, examination not indicated Cervical Exam: Not evaluated. . Extremities: extremities normal, atraumatic, no cyanosis or edema with DTRs 2+ bilaterally Membranes:intact, ruptured  Fetal Monitoring:  Reassuring for GA     Labs:  No results found for this or any previous visit (from the past 24 hour(s)).  Imaging Studies:    ----------------------------------------------------------------------  OBSTETRICS REPORT                      (Signed Final 12/20/2017 10:28  am) ---------------------------------------------------------------------- Patient Info  ID #:       [id number]                          D.O.B.:  [birth year] (22 yrs)  Name:       Carol Bernard                    Visit Date: 12/20/2017 07:38 am ---------------------------------------------------------------------- Performed By  Performed By:     Tommi Emery         Ref. Address:     [address]                                                             [place], [place]  [id number]  Attending:        Darlyn ReadEmily Bunce MD         [facility]  Referred By:      Hermina StaggersMICHAEL L ERVIN                    MD ---------------------------------------------------------------------- Orders   #  Description                                 Code   1  US MFM OB LIMITED                           [id number]   2  US MFM UA CORD DOPPLER                      76820.02   3  US MFM UA ADDL GEST                         76820.01  ----------------------------------------------------------------------   #  Ordered By               Order #        Accession #    Episode #   1  MICHAEL ERVIN            [id number]      [id number]     [id number]   2  MICHAEL ERVIN            [id number]      [phone]     [id number]   3  MICHAEL ERVIN            [id number]      [id number]     [id number]  ---------------------------------------------------------------------- Indications   [redacted] weeks gestation of pregnancy                Z3A.25   Twin pregnancy, mono/di, second trimester      O30.032   Leakage of amniotic fluid (negative fern;      O42.90   positive Amnisure)   Twin pregnancy, mono/di, second trimester      O30.032   Twin gestation, Twin-Twin transfusion; S/P     O43.029   laser photocoagulation on 11/15   Poor obstetric history-Recurrent (habitual)    O26.20   abortion  (3 consecutive ab's)( 1 Molar   pregnancy); px Lovenox  ---------------------------------------------------------------------- OB History  Blood Type:            Height:  5'3"   Weight (lb):  134       BMI:  23.73  Gravidity:    6         Term:   0        Prem:   0        SAB:   4  TOP:          0       Ectopic:  0        Living: 0 ---------------------------------------------------------------------- Fetal Evaluation (Fetus A)  Num Of Fetuses:     2  Fetal Heart         136  Rate(bpm):  Cardiac Activity:   Observed  Fetal Lie:          Lower left Fetus  Presentation:  Cephalic  Placenta:           Anterior, above cervical os  P. Cord Insertion:  Not well visualized  Amniotic Fluid  AFI FV:      Anhydramnios ---------------------------------------------------------------------- Gestational Age (Fetus A)  LMP:           25w 5d        Date:  06/23/17                 EDD:   03/30/18  Best:          Alcus Dad25w 5d     Det. By:  LMP  (06/23/17)          EDD:   03/30/18 ---------------------------------------------------------------------- Doppler - Fetal Vessels (Fetus A)  Umbilical Artery   S/D     %tile     RI              PI              PSV    ADFV    RDFV                                                   (cm/s)  4.01       78   0.75             1.17             40.49      No      No ---------------------------------------------------------------------- Fetal Evaluation (Fetus B)  Num Of Fetuses:     2  Fetal Heart         150  Rate(bpm):  Cardiac Activity:   Observed  Fetal Lie:          upper Right fetus  Presentation:       Cephalic  Placenta:           Anterior, above cervical os  Amniotic Fluid  AFI FV:      Oligohydramnios                              Largest Pocket(cm)                              1.87 ---------------------------------------------------------------------- Gestational Age (Fetus B)  LMP:           25w 5d        Date:  06/23/17                 EDD:    03/30/18  Best:          Alcus Dad25w 5d     Det. By:  LMP  (06/23/17)          EDD:   03/30/18 ---------------------------------------------------------------------- Doppler - Fetal Vessels (Fetus B)  Umbilical Artery   S/D     %tile     RI              PI              PSV                                                   (  cm/s)  5.06    > 97.5    0.8            1.49             38.76 ---------------------------------------------------------------------- Cervix Uterus Adnexa  Cervix  Length:            2.6  cm.  Normal appearance by transabdominal scan. ---------------------------------------------------------------------- Impression  Monochorionic/diamniotic twin gestation at [redacted]w[redacted]d with  suspected PPROM  Cephalic/cephalic presentation  Anhydramnios for A and oligohydramnios for B  Normal stomachs and bladders both twins  Transabdominal cervical evaluation appears normal at 26mm  Normal UA dopplers for A, elevated UA dopplers >97.5%ile  for B. No absent or reversed flow noted. ---------------------------------------------------------------------- Recommendations  Continue inpatient management for PPROM, presumed A.  Given significant change in AFI for both twins and newly  elevated UA dopplers in B, recommend follow-up ultrasound  on Monday for repeat AFI and UA dopplers. Given present  bladder and stomach for both PPROM for B also possible.  Continue serial growth every 3 weeks. Medications:  Scheduled . amoxicillin  500 mg Oral Q8H  . aspirin EC  81 mg Oral Daily  . azithromycin  500 mg Oral Daily  . docusate sodium  100 mg Oral Daily  . enoxaparin  40 mg Subcutaneous Q24H  . feeding supplement (ENSURE ENLIVE)  237 mL Oral BID BM  . prenatal multivitamin  1 tablet Oral Q1200  . sodium chloride flush  3 mL Intravenous Q12H   I have reviewed the patient's current medications.  ASSESSMENT: [id number] [redacted]w[redacted]d Estimated Date of Delivery: 04/04/18  PPROM both twins Elevated Doppler flow  ratios twin B(which was the original donor twin in TTTS) +Lupus anticoagulant Anemia   Patient Active Problem List   Diagnosis Date Noted  . Monochorionic diamniotic twin gestation in second trimester 12/18/2017  . 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 Preterm premature rupture of membranes (PPROM) with unknown onset of labor 12/15/2017  . Anemia in pregnancy 08/17/2017  . Gestational thrombocytopenia (HCC) 08/17/2017  . Nausea and vomiting of pregnancy, antepartum 08/16/2017  . History of multiple miscarriages 03/25/2017  . Burning with urination 03/25/2017  . Supervision of high-risk pregnancy 03/25/2017    PLAN: S/P betamethasone x 2 Continue latency antibiotics Continue lovenox 40 mg SQ and baby ASA S/p IV feraheme x 1, to be repeated  Repeat Doppler in 2 days   Amaryllis Dyke Eure 12/21/2017,7:35 AM

## 2017-12-22 MED ORDER — PROMETHAZINE HCL 25 MG/ML IJ SOLN
25.0000 mg | Freq: Once | INTRAMUSCULAR | Status: AC
  Administered 2017-12-22: 25 mg via INTRAVENOUS
  Filled 2017-12-22: qty 1

## 2017-12-22 NOTE — Progress Notes (Signed)
Patient ID: Carol Bernard, female   DOB: 03/07/1995, 22 y.o.   MRN: [id number] ACULTY PRACTICE ANTEPARTUM COMPREHENSIVE PROGRESS NOTE  Carol Bernard is a 22 y.o. G6P0050 at 2932w2d  who is admitted for PROM Tw A, Mono /Di twins Fetal presentation is cephalic/cephalic Length of Stay:  7  Days  Subjective: Pt without complaints this morning. + FM. Denies ut ctx. Tolerating diet. Some LOF   Vitals:  Blood pressure 116/64, pulse 100, temperature 98.3 F (36.8 C), temperature source Oral, resp. rate 17, height 5' 3.5" (1.613 m), weight 62.6 kg (138 lb), last menstrual period 06/23/2017, SpO2 98 %.    Physical Examination: Lungs clear Heart RRR Abd soft + BS gravid non tender Ext non tender  Fetal Monitoring:  120-130's x 2   Labs:  Results for orders placed or performed during the hospital encounter of 12/15/17 (from the past 24 hour(s))  Type and screen [facility]   Collection Time: 12/21/17  5:39 PM  Result Value Ref Range   ABO/RH(D) A POS    Antibody Screen NEG    Sample Expiration 12/24/2017     Imaging Studies:    none   Medications:  Scheduled . amoxicillin  500 mg Oral Q8H  . aspirin EC  81 mg Oral Daily  . docusate sodium  100 mg Oral Daily  . enoxaparin  40 mg Subcutaneous Q24H  . feeding supplement (ENSURE ENLIVE)  237 mL Oral BID BM  . prenatal multivitamin  1 tablet Oral Q1200  . sodium chloride flush  3 mL Intravenous Q12H   I have reviewed the patient's current medications.  ASSESSMENT: IUP 25 2/7 weeks Twins Mono/DI PPROM Tw A Oligo Tw B Elevated UA dopplers Tw b Anemia H/O multiple miscarries, + Lupus anticoagulant  PLAN: Stable. S/P BMZ. Latency antibiotics. Repeat Dopplers tomorrow.  Continue routine antenatal care.   Hermina StaggersMichael L Eren [place] 12/22/2017,7:02 AM

## 2017-12-23 ENCOUNTER — Inpatient Hospital Stay (HOSPITAL_COMMUNITY): Payer: Medicaid Other

## 2017-12-23 DIAGNOSIS — Z3A25 25 weeks gestation of pregnancy: Secondary | ICD-10-CM

## 2017-12-23 NOTE — Progress Notes (Signed)
Patient ID: Carol Bernard, female   DOB: 01-02-1995, 22 y.o.   MRN: [id number] FACULTY PRACTICE ANTEPARTUM(COMPREHENSIVE) NOTE  Carol Bernard is a 22 y.o. [phone] with Estimated Date of Delivery: 04/04/18   By  early ultrasound [redacted]w[redacted]d   who is admitted for PROM.  Mono Di twins  Fetal presentation is cephalic./cephalic Length of Stay:  8  Days  Date of admission:12/15/2017  Subjective:  Patient reports the fetal movement as pt in general cannot feel fetal movement. Patient reports uterine contraction  activity as none. Patient reports  vaginal bleeding as none. Patient describes fluid per vagina as Clear.  Vitals:  Blood pressure 122/69, pulse (!) 112, temperature 98.6 F (37 C), temperature source Oral, resp. rate 18, height 5' 3.5" (1.613 m), weight 138 lb (62.6 kg), last menstrual period 06/23/2017, SpO2 98 %. Vitals:   12/22/17 1938 12/23/17 0009 12/23/17 0604 12/23/17 0750  BP: 132/74 (!) 122/53 (!) 111/58 122/69  Pulse: (!) 110 (!) 108 (!) 105 (!) 112  Resp: 16 16 17 18   Temp: 98.8 F (37.1 C) 98.6 F (37 C) 98.9 F (37.2 C) 98.6 F (37 C)  TempSrc: Oral Oral Oral Oral  SpO2: 100% 100% 98% 98%  Weight:      Height:       Physical Examination:  General appearance - alert, well appearing, and in no distress Abdomen - soft, nontender, nondistended, no masses or organomegaly Fundal Height:  size equals dates Pelvic Exam:  normal external genitalia, vulva, vagina, cervix, uterus and adnexa, examination not indicated Cervical Exam: Not evaluated. . Extremities: extremities normal, atraumatic, no cyanosis or edema with DTRs 2+ bilaterally Membranes:intact, ruptured  Fetal Monitoring:  Reassuring for GA     Labs:  No results found for this or any previous visit (from the past 24 hour(s)).  Imaging Studies:   [place] from today is pending ----------------------------------------------------------------------  OBSTETRICS REPORT                      (Signed Final 12/20/2017 10:28  am) ---------------------------------------------------------------------- Patient Info  ID #:       [id number]                          D.O.B.:  03-24-1995 (22 yrs)  Name:       Carol Bernard                    Visit Date: 12/20/2017 07:38 am ---------------------------------------------------------------------- Performed By  Performed By:     Tommi Emery         Ref. Address:     [address]  [place], [place]                                                             [address]  Attending:        Darlyn Read MD         Location:         [facility]  Referred By:      Hermina Staggers                    MD ---------------------------------------------------------------------- Orders   #  Description                                 Code   1  [place] MFM OB LIMITED                           [phone]   2  [place] MFM UA CORD DOPPLER                      76820.02   3  [place] MFM UA ADDL GEST                         76820.01  ----------------------------------------------------------------------   #  Ordered By               Order #        Accession #    Episode #   1  MICHAEL ERVIN            [id number]      [id number]     [id number]   2  MICHAEL ERVIN            [id number]      [id number]     [id number]   3  MICHAEL ERVIN            [id number]      [id number]     [id number]  ---------------------------------------------------------------------- Indications   [redacted] weeks gestation of pregnancy                Z3A.25   Twin pregnancy, mono/di, second trimester      O30.032   Leakage of amniotic fluid (negative fern;      O42.90   positive Amnisure)   Twin pregnancy, mono/di, second trimester      O30.032   Twin gestation, Twin-Twin transfusion; S/P     O43.029   laser photocoagulation on 11/15   Poor obstetric history-Recurrent (habitual)    O26.20   abortion  (3 consecutive ab's)( 1 Molar   pregnancy); px Lovenox  ---------------------------------------------------------------------- OB History  Blood Type:            Height:  5'3"   Weight (lb):  134       BMI:  23.73  Gravidity:    6         Term:   0        Prem:   0        SAB:   4  TOP:          0       Ectopic:  0  Living: 0 ---------------------------------------------------------------------- Fetal Evaluation (Fetus A)  Num Of Fetuses:     2  Fetal Heart         136  Rate(bpm):  Cardiac Activity:   Observed  Fetal Lie:          Lower left Fetus  Presentation:       Cephalic  Placenta:           Anterior, above cervical os  P. Cord Insertion:  Not well visualized  Amniotic Fluid  AFI FV:      Anhydramnios ---------------------------------------------------------------------- Gestational Age (Fetus A)  LMP:           25w 5d        Date:  06/23/17                 EDD:   03/30/18  Best:          Alcus Dad25w 5d     Det. By:  LMP  (06/23/17)          EDD:   03/30/18 ---------------------------------------------------------------------- Doppler - Fetal Vessels (Fetus A)  Umbilical Artery   S/D     %tile     RI              PI              PSV    ADFV    RDFV                                                   (cm/s)  4.01       78   0.75             1.17             40.49      No      No ---------------------------------------------------------------------- Fetal Evaluation (Fetus B)  Num Of Fetuses:     2  Fetal Heart         150  Rate(bpm):  Cardiac Activity:   Observed  Fetal Lie:          upper Right fetus  Presentation:       Cephalic  Placenta:           Anterior, above cervical os  Amniotic Fluid  AFI FV:      Oligohydramnios                              Largest Pocket(cm)                              1.87 ---------------------------------------------------------------------- Gestational Age (Fetus B)  LMP:           25w 5d        Date:  06/23/17                 EDD:    03/30/18  Best:          Alcus Dad25w 5d     Det. By:  LMP  (06/23/17)          EDD:   03/30/18 ---------------------------------------------------------------------- Doppler - Fetal Vessels (Fetus B)  Umbilical Artery   S/D     %tile     RI  PI              PSV                                                   (cm/s)  5.06    > 97.5    0.8            1.49             38.76 ---------------------------------------------------------------------- Cervix Uterus Adnexa  Cervix  Length:            2.6  cm.  Normal appearance by transabdominal scan. ---------------------------------------------------------------------- Impression  Monochorionic/diamniotic twin gestation at 3730w5d with  suspected PPROM  Cephalic/cephalic presentation  Anhydramnios for A and oligohydramnios for B  Normal stomachs and bladders both twins  Transabdominal cervical evaluation appears normal at 26mm  Normal UA dopplers for A, elevated UA dopplers >97.5%ile  for B. No absent or reversed flow noted. ---------------------------------------------------------------------- Recommendations  Continue inpatient management for PPROM, presumed A.  Given significant change in AFI for both twins and newly  elevated UA dopplers in B, recommend follow-up ultrasound  on Monday for repeat AFI and UA dopplers. Given present  bladder and stomach for both PPROM for B also possible.  Continue serial growth every 3 weeks. Medications:  Scheduled . aspirin EC  81 mg Oral Daily  . docusate sodium  100 mg Oral Daily  . enoxaparin  40 mg Subcutaneous Q24H  . feeding supplement (ENSURE ENLIVE)  237 mL Oral BID BM  . prenatal multivitamin  1 tablet Oral Q1200  . sodium chloride flush  3 mL Intravenous Q12H   I have reviewed the patient's current medications.  ASSESSMENT: N5A2130G6P0050 4618w3d  Estimated Date of Delivery: 04/04/18  PPROM both twins Elevated Doppler flow ratios twin B(which was the original donor twin in TTTS) +Lupus  anticoagulant Anemia   Patient Active Problem List   Diagnosis Date Noted  . Monochorionic diamniotic twin gestation in second trimester 12/18/2017  . 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 Preterm premature rupture of membranes (PPROM) with unknown onset of labor 12/15/2017  . Anemia in pregnancy 08/17/2017  . Gestational thrombocytopenia (HCC) 08/17/2017  . Nausea and vomiting of pregnancy, antepartum 08/16/2017  . History of multiple miscarriages 03/25/2017  . Burning with urination 03/25/2017  . Supervision of high-risk pregnancy 03/25/2017    PLAN: S/P betamethasone x 2 Continue latency antibiotics Continue lovenox 40 mg SQ and baby ASA S/p IV feraheme x 1, to be repeated  Dopplers from this am pending although patient stated they were improved for B   Lazaro ArmsLuther H Jameika Kinn 12/23/2017,9:11 AM    Patient ID: Carol MeyerHaley M Bernard, female   DOB: 24-Apr-1995, 22 y.o.   MRN: [id number]

## 2017-12-24 LAB — TYPE AND SCREEN
ABO/RH(D): A POS
ANTIBODY SCREEN: NEGATIVE

## 2017-12-24 NOTE — Progress Notes (Addendum)
FACULTY PRACTICE ANTEPARTUM(COMPREHENSIVE) NOTE  Carol Bernard is a 22 y.o. G6P0050 at 3393w4d , mono-di twins with TT transfusion syndrome, s/p Laser ablation of vessels who is admitted for rupture of membranes.  Now with oligohydramnios x 2. Fetal presentation is cephalic and cephalic. Length of Stay:  9  Days  Subjective: No change in status. No bleeding or contractions Patient reports the fetal movement as active. Patient reports uterine contraction  activity as none. Patient reports  vaginal bleeding as none. Patient describes fluid per vagina as Clear.  Vitals:  Blood pressure 112/61, pulse (!) 105, temperature 98.9 F (37.2 C), temperature source Oral, resp. rate 18, height 5' 3.5" (1.613 m), weight 139 lb 3.2 oz (63.1 kg), last menstrual period 06/23/2017, SpO2 100 %. Physical Examination:  General appearance - alert, well appearing, and in no distress, oriented to person, place, and time and normal appearing weight Heart - normal rate and regular rhythm Abdomen - soft, nontender, nondistended Fundal Height:  size less than dates for twin pregnancy. U+5 cm Cervical Exam: Not evaluated. and found to be / / and fetal presentation is vertex/vertex by yesterday's u/s. Extremities: extremities normal, atraumatic, no cyanosis or edema and Homans sign is negative, no sign of DVT Membranes:ruptured  Fetal Monitoring:  Baseline: 135/145 bpm, Variability: Good {> 6 bpm), Accelerations: Reactive and Decelerations: Absent on strips from yesterday. Not on monitor yet today U/s from 12/24"  Labs:    Imaging Studies:     Currently EPIC will not allow sonographic studies to automatically populate into notes.  In the meantime, copy and paste results into note or free text.  Medications:  Scheduled . aspirin EC  81 mg Oral Daily  . docusate sodium  100 mg Oral Daily  . enoxaparin  40 mg Subcutaneous Q24H  . feeding supplement (ENSURE ENLIVE)  237 mL Oral BID BM  . prenatal multivitamin  1  tablet Oral Q1200  . sodium chloride flush  3 mL Intravenous Q12H   I have reviewed the patient's current medications.  ASSESSMENT: Patient Active Problem List   Diagnosis Date Noted  . Monochorionic diamniotic twin gestation in second trimester 12/18/2017  . 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 Premature rupture of membranes in second trimester 12/15/2017  . Anemia in pregnancy 08/17/2017  . Gestational thrombocytopenia (HCC) 08/17/2017  . Nausea and vomiting of pregnancy, antepartum 08/16/2017  . History of multiple miscarriages 03/25/2017  . Burning with urination 03/25/2017  . Supervision of high-risk pregnancy 03/25/2017     PLAN: S/P betamethasone x 2 Continue latency antibiotics Continue lovenox 40 mg SQ and baby ASA S/p IV feraheme x 1, to be repeated Weekly dopplers.  Q shift nst's   Tilda BurrowJohn V Adam Demary 12/24/2017,7:20 AM    Patient ID: Carol Bernard, female   DOB: [birth year], 22 y.o.   MRN: [id number]

## 2017-12-25 DIAGNOSIS — D649 Anemia, unspecified: Secondary | ICD-10-CM

## 2017-12-25 MED ORDER — LACTATED RINGERS IV BOLUS (SEPSIS)
1000.0000 mL | Freq: Once | INTRAVENOUS | Status: AC
  Administered 2017-12-25: 1000 mL via INTRAVENOUS

## 2017-12-25 MED ORDER — TERBUTALINE SULFATE 1 MG/ML IJ SOLN
0.2500 mg | Freq: Once | INTRAMUSCULAR | Status: AC
  Administered 2017-12-25: 0.25 mg via SUBCUTANEOUS
  Filled 2017-12-25: qty 1

## 2017-12-25 NOTE — Progress Notes (Signed)
This is a late entry from a visit that took place on 12/25 in the late morning.  Carol SalterHaley was somewhat tearful during our visit.  She shared about the losses they have experienced as well as the difficulties of this pregnancy including a surgery and an accidental rupture of membranes as a result, along with other health related concerns.  Her family was coming up later in the day to celebrate Christmas with her.  She reports good support from them, but also shared that she takes on a lot of responsibility in caring for family members.  She was very close with her grandfather whom she found unresponsive about a year ago and she coded him, but he did not make it.  She has had other significant losses in her life and she was tearful as we talked about some of those.  She was grateful to have an opportunity to talk and was open to having follow up conversations.  Chaplain Dyanne CarrelKaty Obdulia Bernard, Bcc Pager, [phone] 9:53 AM

## 2017-12-25 NOTE — Progress Notes (Signed)
Patient ID: Carol Bernard, female   DOB: [birth year], 22 y.o.   MRN: [id number]  FACULTY PRACTICE ANTEPARTUM NOTE  Carol Bernard is a 22 y.o. G6P0050 at 2952w5d  who is admitted for PPROM.   Fetal presentation is vtx/vtx Length of Stay:  10  Days  Subjective: No fevers, nausea, vomiting. Occasional contractions. Leaking small amount of clear fluid. Patient reports good fetal movement.     Vitals:  Blood pressure 125/72, pulse (!) 115, temperature 97.9 F (36.6 C), temperature source Oral, resp. rate 18, height 5' 3.5" (1.613 m), weight 139 lb 3.2 oz (63.1 kg), last menstrual period 06/23/2017, SpO2 99 %. Physical Examination:  General appearance - alert, well appearing, and in no distress Chest - no tachypnea, retractions or cyanosis Abdomen - soft, nontender, nondistended, no masses or organomegaly Fundal Height:  consistent with twins Extremities: extremities normal, atraumatic, no cyanosis or edema and Homans sign is negative, no sign of DVT  Membranes:ruptured  Fetal Monitoring:  Baseline: 140/140 bpm, Variability: Good {> 6 bpm), Accelerations: Non-reactive but appropriate for gestational age and Decelerations: Absent x6  Labs:  Results for orders placed or performed during the hospital encounter of 12/15/17 (from the past 24 hour(s))  Type and screen [facility]   Collection Time: 12/24/17  3:58 PM  Result Value Ref Range   ABO/RH(D) A POS    Antibody Screen NEG    Sample Expiration 12/27/2017     Imaging Studies:       Medications:  Scheduled . aspirin EC  81 mg Oral Daily  . docusate sodium  100 mg Oral Daily  . enoxaparin  40 mg Subcutaneous Q24H  . feeding supplement (ENSURE ENLIVE)  237 mL Oral BID BM  . prenatal multivitamin  1 tablet Oral Q1200  . sodium chloride flush  3 mL Intravenous Q12H   I have reviewed the patient's current medications.  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 PPROM  No evidence of infection  S/p latency antibiotics, BMZ  Growth US on Friday  NST reassuring x6 2. Poor OB history  Lovenox 3. Mono/di twins  Growth US Friday 4. Twin/twin transfusion. S/p photoablation  Continue routine antenatal care.   Levie HeritageStinson, Jacob J, DO 12/25/2017,11:53 AM

## 2017-12-26 MED ORDER — LACTATED RINGERS IV BOLUS (SEPSIS)
500.0000 mL | Freq: Once | INTRAVENOUS | Status: AC
  Administered 2017-12-26: 500 mL via INTRAVENOUS

## 2017-12-26 MED ORDER — TERBUTALINE SULFATE 1 MG/ML IJ SOLN
INTRAMUSCULAR | Status: AC
  Administered 2017-12-26: 0.25 mg via SUBCUTANEOUS
  Filled 2017-12-26: qty 1

## 2017-12-26 MED ORDER — TERBUTALINE SULFATE 1 MG/ML IJ SOLN
0.2500 mg | Freq: Once | INTRAMUSCULAR | Status: AC
  Administered 2017-12-26: 0.25 mg via SUBCUTANEOUS

## 2017-12-26 MED ORDER — TERBUTALINE SULFATE 1 MG/ML IJ SOLN
0.2500 mg | Freq: Once | INTRAMUSCULAR | Status: AC
  Administered 2017-12-26: 0.25 mg via SUBCUTANEOUS
  Filled 2017-12-26: qty 1

## 2017-12-26 MED ORDER — LACTATED RINGERS IV BOLUS (SEPSIS)
1000.0000 mL | Freq: Once | INTRAVENOUS | Status: AC
  Administered 2017-12-26: 1000 mL via INTRAVENOUS

## 2017-12-26 MED ORDER — LACTATED RINGERS IV SOLN
INTRAVENOUS | Status: DC
  Administered 2017-12-26: 13:00:00 via INTRAVENOUS
  Administered 2017-12-26: 125 mL/h via INTRAVENOUS
  Administered 2017-12-27: 05:00:00 via INTRAVENOUS

## 2017-12-26 NOTE — Progress Notes (Signed)
Patient ID: Carol Bernard, female   DOB: 1995-09-03, 22 y.o.   MRN: [id number] Patient ID: Carol Bernard, female   DOB: 1995-09-03, 22 y.o.   MRN: [id number] FACULTY PRACTICE ANTEPARTUM(COMPREHENSIVE) NOTE  Carol Bernard is a 22 y.o. [phone] with Estimated Date of Delivery: 04/04/18   By  early ultrasound 5873w6d    who is admitted for PROM.  Mono Di twins  Fetal presentation is cephalic./cephalic Length of Stay:  11  Days  Date of admission:12/15/2017  Subjective:  Patient reports the fetal movement as pt in general cannot feel fetal movement. Patient reports uterine contraction  activity as episodic Patient reports  vaginal bleeding as none. Patient describes fluid per vagina as Clear.  Vitals:  Blood pressure 96/64, pulse (!) 113, temperature 98.6 F (37 C), temperature source Oral, resp. rate 17, height 5' 3.5" (1.613 m), weight 140 lb 0.9 oz (63.5 kg), last menstrual period 06/23/2017, SpO2 100 %. Vitals:   12/25/17 1958 12/25/17 2200 12/26/17 0405 12/26/17 0533  BP: (!) 112/54  96/64   Pulse: (!) 114  (!) 113   Resp: 18  17   Temp: 98.6 F (37 C)  98.6 F (37 C)   TempSrc: Oral  Oral   SpO2: 100% 100% 100% 100%  Weight:   140 lb 0.9 oz (63.5 kg)   Height:       Physical Examination:  General appearance - alert, well appearing, and in no distress Abdomen - soft, nontender, nondistended, no masses or organomegaly Fundal Height:  size equals dates Pelvic Exam:  normal external genitalia, vulva, vagina, cervix, uterus and adnexa, examination not indicated Cervical Exam: Not evaluated. . Extremities: extremities normal, atraumatic, no cyanosis or edema with DTRs 2+ bilaterally Membranes:intact, ruptured  Fetal Monitoring:  Reassuring for GA     Labs:  No results found for this or any previous visit (from the past 24 hour(s)).  Imaging Studies:   No new scans since 12/23/2017  Medications:  Scheduled . aspirin EC  81 mg Oral Daily  . docusate sodium  100 mg Oral Daily  .  enoxaparin  40 mg Subcutaneous Q24H  . feeding supplement (ENSURE ENLIVE)  237 mL Oral BID BM  . prenatal multivitamin  1 tablet Oral Q1200  . sodium chloride flush  3 mL Intravenous Q12H   I have reviewed the patient's current medications.  ASSESSMENT: [id number] 2673w6d   Estimated Date of Delivery: 04/04/18  PPROM both twins Elevated Doppler flow ratios twin B(which was the original donor twin in TTTS) +Lupus anticoagulant Anemia   Patient Active Problem List   Diagnosis Date Noted  . Monochorionic diamniotic twin gestation in second trimester 12/18/2017  . 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 Premature rupture of membranes in second trimester 12/15/2017  . Anemia in pregnancy 08/17/2017  . Gestational thrombocytopenia (HCC) 08/17/2017  . Nausea and vomiting of pregnancy, antepartum 08/16/2017  . History of multiple miscarriages 03/25/2017  . Burning with urination 03/25/2017  . Supervision of high-risk pregnancy 03/25/2017    PLAN: S/P betamethasone x 2 Finished latency antibiotics Continue lovenox 40 mg SQ and baby ASA S/p IV feraheme x 1, to be repeated  Growth scan 12/27/2017, weekly Dopplers would be due on Monday  Pt is beginning to have more uterine activity, received fluid + SQ terb x 2 in the last 24 hours  Lazaro ArmsLuther H Eure 12/26/2017,7:07 AM    Patient ID: Carol Bernard, female   DOB: 1995-09-03, 22 y.o.  MRN: [id number]

## 2017-12-26 NOTE — Progress Notes (Signed)
Dr Adrian BlackwaterStinson notified of vaginal drainage- scant amount of pink vaginal discharge.  Patient requested to see MD.

## 2017-12-26 NOTE — Consult Note (Signed)
Asked by Dr Despina HiddenEure  to speak to Carol Bernard to discuss outcome of preterm pregnancy at 25 weeks. Chart reviewed. Her OB hx is notable for multiple fetal losses. She is 25 6/7 weeks, mono di twins with PPROM since 12/16, with intermittent contractions requiring terb. Other complications during pregnancy include: twin to twin transfusion treated with ablation, oligohydramnios, and discordant growth per Carol Wood's hx. She has received a course of betamethasone and antibiotics.. Currently on levonox.   I spoke to Carol Joseph ArtWoods in her room with the FOB present. I discussed resuscitation at birth. I talked about a typical clinical course of 26 wk preterm and common morbidities associated with this gestation such as RDS, GI immaturity, and IVH. Discussed need for temp support, HAL, umbilical lines, nutrition, and LOS. I discussed increased complications with twins.  I also discussed breast feeding and benefits especially to a preterm baby.   I answered her questions to her satisfaction.   I spent 30 minutes with this consult, more than 50% of the time was with face-to-face counseling with Carol Joseph ArtWoods and her partner.   Lucillie Garfinkelita Q Medora Roorda, MD  Neonatologist

## 2017-12-26 NOTE — Progress Notes (Signed)
0.25 terbutaline given subQ per order for contractions every 8-10 min and 3/10 on pain scale. Will continue to monitor. Carmelina DaneERRI L Genisis Sonnier, RN

## 2017-12-26 NOTE — Progress Notes (Signed)
Patient started contracting around 0400. They were q7 minutes apart. Called doctor Eure and he ordered Terbutaline and 500 cc of LR. Contractions have calmed down and patient is no longer feeling them.

## 2017-12-26 NOTE — Progress Notes (Signed)
Dr. Erin FullingHarraway-Smith notified due to patient contractions 8-10 min apart. Pt states that she can feel contractions with rating 3/10 on pain scale. Order given for 1000 cc bolus of LR with continuing @ 125 after bolus completion. Carmelina DaneERRI L Ja Pistole, RN

## 2017-12-27 ENCOUNTER — Ambulatory Visit (HOSPITAL_COMMUNITY): Payer: Medicaid Other

## 2017-12-27 ENCOUNTER — Inpatient Hospital Stay (HOSPITAL_COMMUNITY): Payer: Medicaid Other

## 2017-12-27 DIAGNOSIS — O99019 Anemia complicating pregnancy, unspecified trimester: Secondary | ICD-10-CM

## 2017-12-27 LAB — TYPE AND SCREEN
ABO/RH(D): A POS
ANTIBODY SCREEN: NEGATIVE

## 2017-12-27 NOTE — Progress Notes (Signed)
Patient ID: Carol Bernard, female   DOB: 07-Apr-1995, 22 y.o.   MRN: [id number] ACULTY PRACTICE ANTEPARTUM COMPREHENSIVE PROGRESS NOTE  Carol Bernard is a 22 y.o. G6P0050 at 4034w0d  who is admitted for PROM of a mono-di twin gestation'.   Fetal presentation is vtx/vtx. Length of Stay:  12  Days  Subjective: Pt with no further complaints. She is still reporting leakage of fluid that is pink tinged.    Patient reports good fetal movement.  She reports no uterine contractions, no bleeding and no loss of fluid per vagina.  Vitals:  Blood pressure 120/73, pulse (!) 102, temperature 98.3 F (36.8 C), resp. rate 18, height 5' 3.5" (1.613 m), weight 142 lb 0.3 oz (64.4 kg), last menstrual period 06/23/2017, SpO2 98 %. Physical Examination: General appearance - alert, well appearing, and in no distress Abdomen - soft, nontender, nondistended, no masses or organomegaly gravid Extremities - peripheral pulses normal, no pedal edema, no clubbing or cyanosis Cervical Exam: Not evaluated.  Membranes:ruptured  Fetal Monitoring:  Baseline: 140's x 2 bpm, Variability: Good {> 6 bpm), Accelerations: Reactive and Decelerations: Absent x 2  Labs:  No results found for this or any previous visit (from the past 24 hour(s)).  Imaging Studies:    12/27/2017 Impression  Monochorionic/diamniotic twin pregnancy at 26+5 weeks, s/p  fetal surgery for TTTS, pPROM:  Twin A           Active fetus in cephalic presentation           Present fetal bladder           Oligohydramnios           EFW at the 44th centile           UA Doppler studies are normal  Twin B           Cephalic presentation           Present fetal bladder           Oligohydramnios (near anhydramnios)           EFW at the 17th centile           UA Doppler studies are normal  Discordant twin pair (22% discordance)  Cervix is long and closed.  There is a small subchorionic hemorrhage consistent with  likely small  abruption ---------------------------------------------------------------------- Recommendations  Weekly AFIs and UA Dopplers  Recommend CEFM if patient should report  bleeding/contractions/abdominal pain clinically while inpatient  (deferred to managing OB provider; remote read of images  only).   Medications:  Scheduled . aspirin EC  81 mg Oral Daily  . docusate sodium  100 mg Oral Daily  . enoxaparin  40 mg Subcutaneous Q24H  . feeding supplement (ENSURE ENLIVE)  237 mL Oral BID BM  . prenatal multivitamin  1 tablet Oral Q1200  . sodium chloride flush  3 mL Intravenous Q12H   I have reviewed the patient's current medications.  ASSESSMENT: Patient Active Problem List   Diagnosis Date Noted  . Monochorionic diamniotic twin gestation in second trimester 12/18/2017  . 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 Premature rupture of membranes in second trimester 12/15/2017  . Anemia in pregnancy 08/17/2017  . Gestational thrombocytopenia (HCC) 08/17/2017  . Nausea and vomiting of pregnancy, antepartum 08/16/2017  . History of multiple miscarriages 03/25/2017  . Burning with urination 03/25/2017  . Supervision of high-risk pregnancy 03/25/2017    PLAN: Pt s/p BMZ and latency atbx Deliver at 34 weeks  or sooner for maternal or fetal indications Weekly AFIs and UA Dopplers Continue routine antenatal care.  Lakeena Downie Harraway-Smith 12/27/2017,1:25 PM

## 2017-12-27 NOTE — Progress Notes (Signed)
2025 - Pt requested to be monitored at this time

## 2017-12-28 DIAGNOSIS — O42919 Preterm premature rupture of membranes, unspecified as to length of time between rupture and onset of labor, unspecified trimester: Secondary | ICD-10-CM

## 2017-12-28 LAB — CBC
HCT: 28.1 % — ABNORMAL LOW (ref 36.0–46.0)
Hemoglobin: 9.3 g/dL — ABNORMAL LOW (ref 12.0–15.0)
MCH: 28.6 pg (ref 26.0–34.0)
MCHC: 33.1 g/dL (ref 30.0–36.0)
MCV: 86.5 fL (ref 78.0–100.0)
PLATELETS: 200 10*3/uL (ref 150–400)
RBC: 3.25 MIL/uL — AB (ref 3.87–5.11)
RDW: 17.7 % — AB (ref 11.5–15.5)
WBC: 18.5 10*3/uL — ABNORMAL HIGH (ref 4.0–10.5)

## 2017-12-28 MED ORDER — DEXTROSE 5 % IV SOLN
500.0000 mg | INTRAVENOUS | Status: DC
  Filled 2017-12-28: qty 500

## 2017-12-28 MED ORDER — SODIUM CHLORIDE 0.9 % IV SOLN
2.0000 g | Freq: Four times a day (QID) | INTRAVENOUS | Status: DC
  Administered 2017-12-28: 2 g via INTRAVENOUS
  Filled 2017-12-28 (×2): qty 2000

## 2017-12-28 MED ORDER — LACTATED RINGERS IV BOLUS (SEPSIS)
1000.0000 mL | Freq: Once | INTRAVENOUS | Status: AC
  Administered 2017-12-28: 1000 mL via INTRAVENOUS

## 2017-12-28 MED ORDER — MAGNESIUM SULFATE BOLUS VIA INFUSION
6.0000 g | Freq: Once | INTRAVENOUS | Status: AC
Start: 2017-12-28 — End: 2017-12-28
  Administered 2017-12-28: 6 g via INTRAVENOUS
  Filled 2017-12-28: qty 500

## 2017-12-28 MED ORDER — MAGNESIUM SULFATE 40 G IN LACTATED RINGERS - SIMPLE: 2.0000 g/h | INTRAVENOUS | Status: DC

## 2017-12-28 MED ORDER — OXYTOCIN 40 UNITS IN LACTATED RINGERS INFUSION - SIMPLE MED
INTRAVENOUS | Status: AC
  Filled 2017-12-28: qty 1000

## 2017-12-28 MED ORDER — MAGNESIUM SULFATE BOLUS VIA INFUSION: 6.0000 g | Freq: Once | INTRAVENOUS | Status: DC

## 2017-12-28 MED ORDER — MAGNESIUM SULFATE 40 G IN LACTATED RINGERS - SIMPLE
4.0000 g/h | INTRAVENOUS | Status: DC
  Administered 2017-12-28: 4 g/h via INTRAVENOUS
  Filled 2017-12-28: qty 500

## 2017-12-28 MED ORDER — CEFAZOLIN SODIUM-DEXTROSE 2-4 GM/100ML-% IV SOLN
2.0000 g | INTRAVENOUS | Status: DC
  Filled 2017-12-28: qty 100

## 2017-12-28 NOTE — Progress Notes (Signed)
   12/28/17 0107  Hourly Rounding  Assessment Alert  Intervention Call light w/in reach  Uterine Activity  Mode Toco  Contraction Frequency (min) 1 uc  Contraction Duration (sec) 70  Contraction Quality Mild  Resting Tone Palpated Relaxed  Resting Time Adequate  1 uc in 35mins explained to pt its not unusual to have 1uc in 30mins. To call if 6 or more uc in an hour or increase in intensity. Pt verbalize understanding

## 2017-12-28 NOTE — Progress Notes (Signed)
Dr. Erin FullingHarraway-Smith [facility].

## 2017-12-28 NOTE — Progress Notes (Signed)
Patient ID: Carol Bernard, female   DOB: [birth year], 22 y.o.   MRN: [id number] ACULTY PRACTICE ANTEPARTUM COMPREHENSIVE PROGRESS NOTE  Carol Bernard is a 22 y.o. G6P0050 at 8662w1d  who is admitted for PROM.   Fetal presentation is cephalic/cephalic. Length of Stay:  13  Days  Subjective: Pt reports increased contractions since yesterday. Over the past week she received 3 doses of Terb and several boluses of fluid for intermittent contractions. Due to her h/o PROM her cervix was not checked.  Today she c/o increased intensity of the contractions and Dr. Vergie LivingPickens checked her cervix.  At present she reports intermittent contractions that are not as intense as prev.   Patient reports good fetal movement.  She reports intermittent uterine contractions, no bleeding does report continued no loss of fluid per vagina.  Vitals:  Blood pressure (!) 140/95, pulse (!) 121, temperature 99 F (37.2 C), temperature source Oral, resp. rate 16, height 5' 3.5" (1.613 m), weight 139 lb 5 oz (63.2 kg), last menstrual period 06/23/2017, SpO2 100 %. Physical Examination: General appearance - alert, well appearing, and in no distress Abdomen - soft, nontender, nondistended, no masses or organomegaly gravid Extremities - peripheral pulses normal, no pedal edema, no clubbing or cyanosis Cervical Exam: Evaluated by digital exam., Position: mid position, Dilation: 5cm, Thickness: <0.5 cm, Consistency: soft and bulging BOW Membranes:: ruptured but,pt has a bulging BOW    Fetal Monitoring:  Baseline: 150's bpm, Variability: Good {> 6 bpm), Accelerations: Reactive and Decelerations: Absent Toco: intermittent contractions   Labs:  Results for orders placed or performed during the hospital encounter of 12/15/17 (from the past 24 hour(s))  Type and screen [facility] [place]   Collection Time: 12/27/17  5:39 PM  Result Value Ref Range   ABO/RH(D) A POS    Antibody Screen NEG    Sample Expiration 12/30/2017   CBC    Collection Time: 12/28/17  3:23 PM  Result Value Ref Range   WBC 18.5 (H) 4.0 - 10.5 K/uL   RBC 3.25 (L) 3.87 - 5.11 MIL/uL   Hemoglobin 9.3 (L) 12.0 - 15.0 g/dL   HCT 78.228.1 (L) 95.636.0 - 21.346.0 %   MCV 86.5 78.0 - 100.0 fL   MCH 28.6 26.0 - 34.0 pg   MCHC 33.1 30.0 - 36.0 g/dL   RDW 08.617.7 (H) 57.811.5 - 46.915.5 %   Platelets 200 150 - 400 K/uL    Imaging Studies:    12/27/2016  Impression  Monochorionic/diamniotic twin pregnancy at 26+5 weeks, s/p  fetal surgery for TTTS, pPROM:  Twin A           Active fetus in cephalic presentation           Present fetal bladder           Oligohydramnios           EFW at the 44th centile           UA Doppler studies are normal  Twin B           Cephalic presentation           Present fetal bladder           Oligohydramnios (near anhydramnios)           EFW at the 17th centile           UA Doppler studies are normal  Discordant twin pair (22% discordance)  Cervix is long and closed.  There is a  small subchorionic hemorrhage consistent with  likely small abruption ---------------------------------------------------------------------- Recommendations  Weekly AFIs and UA Dopplers  Recommend CEFM if patient should report  bleeding/contractions/abdominal pain clinically while inpatient  (deferred to managing OB provider; remote read of images  only) Medications:  Scheduled . aspirin EC  81 mg Oral Daily  . docusate sodium  100 mg Oral Daily  . feeding supplement (ENSURE ENLIVE)  237 mL Oral BID BM  . magnesium  6 g Intravenous Once  . prenatal multivitamin  1 tablet Oral Q1200  . sodium chloride flush  3 mL Intravenous Q12H   I have reviewed the patient's current medications.  ASSESSMENT: Patient Active Problem List   Diagnosis Date Noted  . Monochorionic diamniotic twin gestation in second trimester 12/18/2017  . 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 Premature rupture of membranes in second trimester 12/15/2017  . Anemia in  pregnancy 08/17/2017  . Gestational thrombocytopenia (HCC) 08/17/2017  . Nausea and vomiting of pregnancy, antepartum 08/16/2017  . History of multiple miscarriages 03/25/2017  . Burning with urination 03/25/2017  . Supervision of high-risk pregnancy 03/25/2017    PLAN: Pt in latent phase labor  Given the NICU status rec transfer while stable Pt current on Magnesium and Ampicillin.   [facility].   Continue routine antenatal care.  Jonnatan Hanners Harraway-Smith 12/28/2017,3:42 PM

## 2017-12-28 NOTE — Progress Notes (Signed)
Patient called me into her room, c/o increased frequency and discomfort with contractions. Placed on EFM toco.

## 2017-12-28 NOTE — Progress Notes (Signed)
CTSP for more pressure and feeling UCs approx q8153m. Some scant pink fluid with discharge  SSE: BBOW seen, clear fluid SVE: 7/80/BBOW to -1  Will do LR bolus, Mg 6 and 2 and d/w pt that I recommend proceeding with delivery given how far dilated she is  Union Bingharlie Adair Lemar, Montez HagemanJr MD Attending [facility] 12/28/2017 Time: [phone]

## 2017-12-28 NOTE — Progress Notes (Signed)
Patient ID: Carol Bernard, female   DOB: [birth year], 22 y.o.   MRN: [id number]  FACULTY PRACTICE ANTEPARTUM(COMPREHENSIVE) NOTE  Carol Bernard is a 22 y.o. G6P0050 at 7446w1d by who is admitted for PROM.   Fetal presentation is v/v.mono-di twin gestation   Length of Stay:  13  Days  Subjective:  Patient reports the fetal movement as active. Patient reports uterine contraction  activity as none. Patient reports  vaginal bleeding as scant staining. Patient describes fluid per vagina as Other pink. Low abdomen sore Vitals:  Blood pressure (!) 101/55, pulse 99, temperature 98.1 F (36.7 C), temperature source Oral, resp. rate 18, height 5' 3.5" (1.613 m), weight 63.2 kg (139 lb 5 oz), last menstrual period 06/23/2017, SpO2 99 %. Physical Examination:  General appearance - alert, well appearing, and in no distress Heart - normal rate and regular rhythm Abdomen - soft, nontender, nondistended Fundal Height:  consistent with twins Cervical Exam: Not evaluated. Extremities: extremities normal, atraumatic, no cyanosis or edema and Homans sign is negative, no sign of DVT with DTRs 2+ bilaterally Membranes:ruptured  Fetal Monitoring:     Fetal Heart Rate A  Mode External filed at 12/27/2017 2116  Baseline Rate (A) 140 bpm filed at 12/27/2017 2116  Variability 6-25 BPM filed at 12/27/2017 2116  Accelerations 10 x 10 filed at 12/27/2017 2116  Decelerations Variable filed at 12/27/2017 2116  Multiple birth? Y filed at 12/27/2017 1140  Fetal Heart Rate Fetus B  Mode External filed at 12/27/2017 2116  Baseline Rate (B) 145 BPM filed at 12/27/2017 2116  Variability 6-25 BPM filed at 12/27/2017 2116  Accelerations 10 x 10 filed at 12/27/2017 2116  Decelerations None filed at 12/27/2017 2116     Labs:  Results for orders placed or performed during the hospital encounter of 12/15/17 (from the past 24 hour(s))  Type and screen [facility] [place]   Collection Time: 12/27/17  5:39 PM   Result Value Ref Range   ABO/RH(D) A POS    Antibody Screen NEG    Sample Expiration 12/30/2017       Medications:  Scheduled . aspirin EC  81 mg Oral Daily  . docusate sodium  100 mg Oral Daily  . enoxaparin  40 mg Subcutaneous Q24H  . feeding supplement (ENSURE ENLIVE)  237 mL Oral BID BM  . prenatal multivitamin  1 tablet Oral Q1200  . sodium chloride flush  3 mL Intravenous Q12H   I have reviewed the patient's current medications.  ASSESSMENT: Patient Active Problem List   Diagnosis Date Noted  . Monochorionic diamniotic twin gestation in second trimester 12/18/2017  . 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 Premature rupture of membranes in second trimester 12/15/2017  . Anemia in pregnancy 08/17/2017  . Gestational thrombocytopenia (HCC) 08/17/2017  . Nausea and vomiting of pregnancy, antepartum 08/16/2017  . History of multiple miscarriages 03/25/2017  . Burning with urination 03/25/2017  . Supervision of high-risk pregnancy 03/25/2017    PLAN: Pt s/p BMZ and latency atbx Deliver at 34 weeks or sooner for maternal or fetal indications Weekly AFIs and UA Dopplers Continue routine antenatal care.    Scheryl DarterJames Ambermarie Honeyman 12/28/2017,7:21 AM

## 2017-12-28 NOTE — Progress Notes (Signed)
Dr. Vergie LivingPickens states that he is not concerned at this time with contraction pattern because after reviewing EFM strip, patient has not been contracting any more than she has been over the last day or two or c/o increased pain with contractions. MD stated to call back if patient c/o pain with contractions increased above baseline of the last few days.

## 2017-12-28 NOTE — Progress Notes (Signed)
Report given to Candise BowensJen, [facility].  Pt on her way out.

## 2017-12-29 MED ORDER — BISACODYL 10 MG RE SUPP
10.00 | RECTAL | Status: DC
Start: ? — End: 2017-12-29

## 2017-12-29 MED ORDER — ENOXAPARIN SODIUM 40 MG/0.4ML ~~LOC~~ SOLN
40.00 | SUBCUTANEOUS | Status: DC
Start: 2017-12-30 — End: 2017-12-29

## 2017-12-29 MED ORDER — NALBUPHINE HCL 10 MG/ML IJ SOLN
2.00 | INTRAMUSCULAR | Status: DC
Start: ? — End: 2017-12-29

## 2017-12-29 MED ORDER — GENERIC EXTERNAL MEDICATION
Status: DC
Start: ? — End: 2017-12-29

## 2017-12-29 MED ORDER — IBUPROFEN 800 MG PO TABS
800.00 | ORAL_TABLET | ORAL | Status: DC
Start: 2017-12-30 — End: 2017-12-29

## 2017-12-29 MED ORDER — GUAIFENESIN 100 MG/5ML PO LIQD
200.00 | ORAL | Status: DC
Start: ? — End: 2017-12-29

## 2017-12-29 MED ORDER — LACTATED RINGERS IV SOLN
INTRAVENOUS | Status: DC
Start: ? — End: 2017-12-29

## 2017-12-29 MED ORDER — MORPHINE SULFATE (PF) 4 MG/ML IV SOLN
4.00 | INTRAVENOUS | Status: DC
Start: ? — End: 2017-12-29

## 2017-12-29 MED ORDER — HYDROCODONE-ACETAMINOPHEN 10-325 MG PO TABS
ORAL_TABLET | ORAL | Status: DC
Start: ? — End: 2017-12-29

## 2017-12-29 MED ORDER — MAGNESIUM HYDROXIDE 400 MG/5ML PO SUSP
30.00 | ORAL | Status: DC
Start: ? — End: 2017-12-29

## 2017-12-29 MED ORDER — OXYCODONE HCL 10 MG PO TABS
10.00 | ORAL_TABLET | ORAL | Status: DC
Start: ? — End: 2017-12-29

## 2017-12-29 MED ORDER — BENZOCAINE-MENTHOL 20-0.5 % EX AERO
INHALATION_SPRAY | CUTANEOUS | Status: DC
Start: ? — End: 2017-12-29

## 2017-12-29 MED ORDER — BENZOCAINE-MENTHOL 15-3.6 MG MT LOZG
LOZENGE | OROMUCOSAL | Status: DC
Start: ? — End: 2017-12-29

## 2017-12-29 MED ORDER — HYDROCODONE-ACETAMINOPHEN 5-325 MG PO TABS
ORAL_TABLET | ORAL | Status: DC
Start: ? — End: 2017-12-29

## 2017-12-29 MED ORDER — SALINE NASAL SPRAY 0.65 % NA SOLN
NASAL | Status: DC
Start: ? — End: 2017-12-29

## 2017-12-29 MED ORDER — TETANUS-DIPHTH-ACELL PERTUSSIS 5-2-15.5 LF-MCG/0.5 IM SUSP
0.50 | INTRAMUSCULAR | Status: DC
Start: ? — End: 2017-12-29

## 2017-12-29 MED ORDER — LANOLIN EX OINT
TOPICAL_OINTMENT | CUTANEOUS | Status: DC
Start: ? — End: 2017-12-29

## 2017-12-29 MED ORDER — OXYTOCIN-SODIUM CHLORIDE 30-0.9 UT/500ML-% IV SOLN
100.00 | INTRAVENOUS | Status: DC
Start: ? — End: 2017-12-29

## 2017-12-29 MED ORDER — GENERIC EXTERNAL MEDICATION
2.00 | Status: DC
Start: 2017-12-29 — End: 2017-12-29

## 2017-12-29 MED ORDER — PRENATAL VITAMINS 28-0.8 MG PO TABS
ORAL_TABLET | ORAL | Status: DC
Start: 2017-12-30 — End: 2017-12-29

## 2017-12-29 MED ORDER — ONDANSETRON HCL 4 MG/2ML IJ SOLN
4.00 | INTRAMUSCULAR | Status: DC
Start: ? — End: 2017-12-29

## 2017-12-29 MED ORDER — SIMETHICONE 80 MG PO CHEW
80.00 | CHEWABLE_TABLET | ORAL | Status: DC
Start: ? — End: 2017-12-29

## 2017-12-29 MED ORDER — CLINDAMYCIN PHOSPHATE IN D5W 900 MG/50ML IV SOLN
900.00 | INTRAVENOUS | Status: DC
Start: 2017-12-29 — End: 2017-12-29

## 2017-12-29 MED ORDER — ACETAMINOPHEN 325 MG PO TABS
650.00 | ORAL_TABLET | ORAL | Status: DC
Start: ? — End: 2017-12-29

## 2017-12-29 MED ORDER — SENNA-DOCUSATE SODIUM 8.6-50 MG PO TABS
ORAL_TABLET | ORAL | Status: DC
Start: 2017-12-30 — End: 2017-12-29

## 2017-12-29 MED ORDER — DOCUSATE SODIUM 100 MG PO CAPS
100.00 | ORAL_CAPSULE | ORAL | Status: DC
Start: 2017-12-30 — End: 2017-12-29

## 2017-12-29 MED ORDER — GENERIC EXTERNAL MEDICATION
5.00 | Status: DC
Start: 2017-12-29 — End: 2017-12-29

## 2017-12-29 MED ORDER — HYDROMORPHONE HCL 1 MG/ML IJ SOLN
1.00 | INTRAMUSCULAR | Status: DC
Start: ? — End: 2017-12-29

## 2017-12-29 MED ORDER — MORPHINE SULFATE (PF) 10 MG/ML IV SOLN
10.00 | INTRAVENOUS | Status: DC
Start: ? — End: 2017-12-29

## 2017-12-29 MED ORDER — MORPHINE SULFATE (PF) 2 MG/ML IV SOLN
2.00 | INTRAVENOUS | Status: DC
Start: ? — End: 2017-12-29

## 2017-12-29 MED ORDER — MEASLES, MUMPS & RUBELLA VAC ~~LOC~~ INJ
0.50 | INJECTION | SUBCUTANEOUS | Status: DC
Start: ? — End: 2017-12-29

## 2018-01-02 NOTE — Discharge Summary (Signed)
Antenatal Physician Discharge Summary  Patient ID: Carol Bernard MRN: [id number] DOB/AGE: 1995-02-14 23 y.o.  Admit date: 12/15/2017 Discharge date: 01/02/2018  Admission Diagnoses: PPROM; mono/di twin IUP; poor OB history    Discharge Diagnoses: same; latent phase labor  Prenatal Procedures: NST and ultrasound  Intrapartum Procedures: Neonatology, Maternal Fetal Medicine  Significant Diagnostic Studies:  Results for orders placed or performed during the hospital encounter of 12/15/17 (from the past 168 hour(s))  Type and screen [facility]   Collection Time: 12/27/17  5:39 PM  Result Value Ref Range   ABO/RH(D) A POS    Antibody Screen NEG    Sample Expiration 12/30/2017   CBC   Collection Time: 12/28/17  3:23 PM  Result Value Ref Range   WBC 18.5 (H) 4.0 - 10.5 K/uL   RBC 3.25 (L) 3.87 - 5.11 MIL/uL   Hemoglobin 9.3 (L) 12.0 - 15.0 g/dL   HCT 40.9 (L) 81.1 - 91.4 %   MCV 86.5 78.0 - 100.0 fL   MCH 28.6 26.0 - 34.0 pg   MCHC 33.1 30.0 - 36.0 g/dL   RDW 78.2 (H) 95.6 - 21.3 %   Platelets 200 150 - 400 K/uL    Treatments: steroids: betaemthasone; magnesium sulfate   Hospital Course:  This is a 23 y.o. G6P0050 with IUP at [redacted]w[redacted]d admitted for PPROM. She was initially started on latency antibiotics. She was without contraction until the day prior to transfer. She began to have irreg contractions that concerned her. She was wirred about delivering at a hosp whenre the baby would need to be transferrred as the NICU was ful as [facility].  She was checked and thought ot be 7cm so was transferred to L&D. She was rechecked and found to be 5cm and 50%. She was started on magnesium sulfate for neuroprotection. She had recevied betamethasone <2 weeks prev. She was seen by Neonatology during her stay.  She was observed, fetal heart rate monitoring remained reassuring. Because she was preterm and the NICU is full, pt opted for transfer while pregnant. Her exam did not change from my  initial pelvic to 1 1/2 hour later. She was deemed stable for transfer. Discharge Exam: BP 128/77   Pulse (!) 107   Temp 98.2 F (36.8 C) (Oral)   Resp 16   Ht 5' 3.5" (1.613 m)   Wt 139 lb 5 oz (63.2 kg)   LMP 06/23/2017   SpO2 100%   BMI 24.29 kg/m   CONSTITUTIONAL: Well-developed, well-nourished female in no acute distress. Pt is laughing with family.  cervix 5cm/50%/ bulging BOWI    Discharge Condition: fair  Disposition: [facility] Not Defined   Allergies as of 12/28/2017      Reactions   Pecan Nut (diagnostic) Hives   Reglan [metoclopramide] Hives      Medication List    ASK your doctor about these medications   aspirin EC 81 MG tablet Take 81 mg by mouth daily.   cyclobenzaprine 10 MG tablet Commonly known as:  FLEXERIL Take 0.5-1 tablets (5-10 mg total) 3 (three) times daily as needed by mouth for muscle spasms.   docusate sodium 100 MG capsule Commonly known as:  COLACE Take 1 capsule (100 mg total) 2 (two) times daily as needed by mouth.   enoxaparin 40 MG/0.4ML injection Commonly known as:  LOVENOX Inject 0.4 mLs (40 mg total) daily into the skin.   FLINSTONES GUMMIES OMEGA-3 DHA PO Take 2 tablets by mouth daily.  Takes 2 daily   Iron Polysacch Cmplx-B12-FA 150-0.025-1 MG Caps Take one capsule daily   progesterone 200 MG capsule Commonly known as:  PROMETRIUM 1 capsule nightly at bedtime   promethazine 12.5 MG suppository Commonly known as:  PHENERGAN Place 1 suppository (12.5 mg total) rectally every 6 (six) hours as needed for nausea or vomiting.   VENTOLIN HFA 108 (90 Base) MCG/ACT inhaler Generic drug:  albuterol Inhale 2 puffs into the lungs every 6 (six) hours as needed for wheezing or shortness of breath.        Signed: Willodean Rosenthalarolyn Harraway-Smith M.D. 01/02/2018, 1:29 PM

## 2018-01-03 ENCOUNTER — Ambulatory Visit (HOSPITAL_COMMUNITY)
Admission: RE | Admit: 2018-01-03 | Discharge: 2018-01-03 | Disposition: A | Discharge status: Home / Self Care | Payer: Medicaid Other | Source: Ambulatory Visit | Attending: Obstetrics & Gynecology | Admitting: Obstetrics & Gynecology

## 2018-01-07 ENCOUNTER — Encounter: Payer: Self-pay | Admitting: *Deleted

## 2018-01-07 ENCOUNTER — Telehealth: Payer: Self-pay | Admitting: Obstetrics & Gynecology

## 2018-01-07 ENCOUNTER — Ambulatory Visit (INDEPENDENT_AMBULATORY_CARE_PROVIDER_SITE_OTHER): Payer: Commercial Managed Care - PPO | Admitting: Obstetrics & Gynecology

## 2018-01-07 VITALS — BP 96/50 | HR 88 | Ht 63.0 in | Wt 128.0 lb

## 2018-01-07 DIAGNOSIS — Z98891 History of uterine scar from previous surgery: Secondary | ICD-10-CM

## 2018-01-07 DIAGNOSIS — Z9889 Other specified postprocedural states: Secondary | ICD-10-CM

## 2018-01-07 MED ORDER — OXYCODONE-ACETAMINOPHEN 5-325 MG PO TABS: 1.0000 | ORAL_TABLET | ORAL | 0 refills | Status: DC | PRN

## 2018-01-07 NOTE — Progress Notes (Signed)
  HPI: Patient returns for routine postoperative follow-up having undergone primary low transverse Caesarean section on 12/28/2017.  The patient's immediate postoperative recovery has been unremarkable. Since hospital discharge the patient reports no problems.   Current Outpatient Medications: albuterol (VENTOLIN HFA) 108 (90 Base) MCG/ACT inhaler, Inhale 2 puffs into the lungs every 6 (six) hours as needed for wheezing or shortness of breath. , Disp: , Rfl:  aspirin EC 81 MG tablet, Take 81 mg by mouth daily., Disp: , Rfl:  enoxaparin (LOVENOX) 40 MG/0.4ML injection, Inject 0.4 mLs (40 mg total) daily into the skin., Disp: 30 Syringe, Rfl: 4 ferrous sulfate 325 (65 FE) MG tablet, Take by mouth., Disp: , Rfl:  HYDROcodone-acetaminophen (NORCO/VICODIN) 5-325 MG tablet, Take by mouth., Disp: , Rfl:  ibuprofen (ADVIL,MOTRIN) 800 MG tablet, Take by mouth., Disp: , Rfl:  Pediatric Multiple Vit-C-FA (FLINSTONES GUMMIES OMEGA-3 DHA PO), Take 2 tablets by mouth daily. Takes 2 daily , Disp: , Rfl:  cyclobenzaprine (FLEXERIL) 10 MG tablet, Take 0.5-1 tablets (5-10 mg total) 3 (three) times daily as needed by mouth for muscle spasms. (Patient not taking: Reported on 12/15/2017), Disp: 30 tablet, Rfl: 0 docusate sodium (COLACE) 100 MG capsule, Take 1 capsule (100 mg total) 2 (two) times daily as needed by mouth. (Patient not taking: Reported on 12/15/2017), Disp: 30 capsule, Rfl: 2 oxyCODONE-acetaminophen (PERCOCET/ROXICET) 5-325 MG tablet, Take 1 tablet by mouth every 4 (four) hours as needed for severe pain., Disp: 30 tablet, Rfl: 0 promethazine (PHENERGAN) 12.5 MG suppository, Place 1 suppository (12.5 mg total) rectally every 6 (six) hours as needed for nausea or vomiting. (Patient not taking: Reported on 01/07/2018), Disp: 24 each, Rfl: 6  No current facility-administered medications for this visit.     Blood pressure (!) 96/50, pulse 88, height 5\' 3"  (1.6 m), weight 128 lb (58.1 kg), unknown if  currently breastfeeding.  Physical Exam: Abdomen soft non tender Incision clean dry intact  Diagnostic Tests:   Pathology:   Impression: S/p LTCS for mono di tins at 6455w3d due to PPROM   Plan:  Follow up: 4  weeks   Meds ordered this encounter  Medications  . oxyCODONE-acetaminophen (PERCOCET/ROXICET) 5-325 MG tablet    Sig: Take 1 tablet by mouth every 4 (four) hours as needed for severe pain.    Dispense:  30 tablet    Refill:  0     Lazaro ArmsLuther H Eure, MD

## 2018-01-08 NOTE — Telephone Encounter (Signed)
No she does not need it  She has no history of a clot and simply has a +lupus anticoagulant which is significant for her recurrent pregnancy loss issue but not for prolonged therapy post delivery

## 2018-01-09 ENCOUNTER — Encounter: Payer: Self-pay | Admitting: *Deleted

## 2018-01-22 ENCOUNTER — Encounter: Payer: Self-pay | Admitting: Obstetrics & Gynecology

## 2018-01-24 ENCOUNTER — Encounter: Payer: Self-pay | Admitting: Obstetrics & Gynecology

## 2018-01-24 ENCOUNTER — Ambulatory Visit (INDEPENDENT_AMBULATORY_CARE_PROVIDER_SITE_OTHER): Payer: Commercial Managed Care - PPO | Admitting: Obstetrics & Gynecology

## 2018-01-24 VITALS — BP 100/60 | HR 98 | Ht 63.2 in | Wt 132.0 lb

## 2018-01-24 DIAGNOSIS — F5102 Adjustment insomnia: Secondary | ICD-10-CM

## 2018-01-24 DIAGNOSIS — M79672 Pain in left foot: Secondary | ICD-10-CM | POA: Diagnosis not present

## 2018-01-24 DIAGNOSIS — M79671 Pain in right foot: Secondary | ICD-10-CM | POA: Diagnosis not present

## 2018-01-24 MED ORDER — ZOLPIDEM TARTRATE 5 MG PO TABS: 10.0000 mg | ORAL_TABLET | Freq: Every evening | ORAL | 3 refills | Status: DC | PRN

## 2018-01-24 MED ORDER — GABAPENTIN 300 MG PO CAPS: ORAL_CAPSULE | ORAL | 1 refills | Status: DC

## 2018-01-24 MED ORDER — ZOLPIDEM TARTRATE 5 MG PO TABS: 5.0000 mg | ORAL_TABLET | Freq: Every evening | ORAL | 3 refills | Status: DC | PRN

## 2018-01-24 NOTE — Progress Notes (Signed)
Chief Complaint  Patient presents with  . feet pain      23 y.o. [id number] No LMP recorded. The current method of family planning is none.  Outpatient Encounter Medications as of 01/24/2018  Medication Sig  . albuterol (VENTOLIN HFA) 108 (90 Base) MCG/ACT inhaler Inhale 2 puffs into the lungs every 6 (six) hours as needed for wheezing or shortness of breath.   Marland Kitchen. aspirin EC 81 MG tablet Take 81 mg by mouth daily.  . cyclobenzaprine (FLEXERIL) 10 MG tablet Take 0.5-1 tablets (5-10 mg total) 3 (three) times daily as needed by mouth for muscle spasms. (Patient not taking: Reported on 02/04/2018)  . ferrous sulfate 325 (65 FE) MG tablet Take by mouth.  . gabapentin (NEURONTIN) 300 MG capsule Take 1 tablet day #1. 1 tablet twice a day day #2 then 1 tablet 3 times daily  . ibuprofen (ADVIL,MOTRIN) 800 MG tablet Take by mouth.  . Pediatric Multiple Vit-C-FA (FLINSTONES GUMMIES OMEGA-3 DHA PO) Take 2 tablets by mouth daily. Takes 2 daily   . promethazine (PHENERGAN) 12.5 MG suppository Place 1 suppository (12.5 mg total) rectally every 6 (six) hours as needed for nausea or vomiting. (Patient not taking: Reported on 01/07/2018)  . zolpidem (AMBIEN) 5 MG tablet Take 1 tablet (5 mg total) by mouth at bedtime as needed for sleep.  . [DISCONTINUED] docusate sodium (COLACE) 100 MG capsule Take 1 capsule (100 mg total) 2 (two) times daily as needed by mouth. (Patient not taking: Reported on 12/15/2017)  . [DISCONTINUED] enoxaparin (LOVENOX) 40 MG/0.4ML injection Inject 0.4 mLs (40 mg total) daily into the skin. (Patient not taking: Reported on 01/24/2018)  . [DISCONTINUED] oxyCODONE-acetaminophen (PERCOCET/ROXICET) 5-325 MG tablet Take 1 tablet by mouth every 4 (four) hours as needed for severe pain. (Patient not taking: Reported on 01/24/2018)  . [DISCONTINUED] zolpidem (AMBIEN) 5 MG tablet Take 2 tablets (10 mg total) by mouth at bedtime as needed for sleep.  . [DISCONTINUED] zolpidem (AMBIEN) 5 MG tablet  Take 2 tablets (10 mg total) by mouth at bedtime as needed for sleep.   No facility-administered encounter medications on file as of 01/24/2018.     Subjective Carol Bernard presents today for evaluation of an unusual complaint She states essentially within a few days of her surgery she began having bilateral foot pain but this makes it nearly a month ago She states the pain is constant but has times of worsening especially worse when she goes to bed at night The pain is in both feet She denies any trauma to either 1 of her feet She denies any significant back pain She states that there is no temperature sensitivity either cold or hot that her feet do not feel cold or hot She describes the pain as being starting basically at the ankle on each side and covers the whole foot bilaterally He states that the pain is only improved or almost gone when something is tightly compressing her feet Tylenol and ibuprofen do not improve the discomfort She describes it is being an achy like a deep ache like a burning ache  Of course [place] and some anxiety and insomnia disruption of her sleep cycles and she has had this problem to some degree in the past Past Medical History:  Diagnosis Date  . Anxiety and depression   . Asthma   . Birth defect   . Cyst of left kidney   . Dysrhythmia  h/o SVT  . GERD (gastroesophageal reflux disease)   . Headache   . Hypoglycemia   . Lupus   . Miscarriage   . Palpitations     Past Surgical History:  Procedure Laterality Date  . CESAREAN SECTION    . DILATION AND CURETTAGE OF UTERUS N/A 01/29/2017   Procedure: SUCTION DILATATION AND CURETTAGE;  Surgeon: Tilda Burrow, MD;  Location: [facility];  Service: Gynecology;  Laterality: N/A;  . DILATION AND CURETTAGE OF UTERUS N/A 05/01/2017   Procedure: SUCTION DILATATION AND CURETTAGE;  Surgeon: Lazaro Arms, MD;  Location: [facility];  Service: Gynecology;  Laterality: N/A;  pt to  arrive at 7:15 to have labwork  . FETOSCOPIC LASER PHOTOCOAGULATION  11/14/2017   of 34 placental vessels for TTTS  . WISDOM TOOTH EXTRACTION      OB History    Gravida Para Term Preterm AB Living   6 1   1 5 2    SAB TAB Ectopic Multiple Live Births   4     1        Allergies  Allergen Reactions  . Pecan Nut (Diagnostic) Hives  . Reglan [Metoclopramide] Hives    Social History   Socioeconomic History  . Marital status: Single    Spouse name: None  . Number of children: None  . Years of education: None  . Highest education level: None  Social Needs  . Financial resource strain: None  . Food insecurity - worry: None  . Food insecurity - inability: None  . Transportation needs - medical: None  . Transportation needs - non-medical: None  Occupational History  . None  Tobacco Use  . Smoking status: Never Smoker  . Smokeless tobacco: Never Used  Substance and Sexual Activity  . Alcohol use: No    Alcohol/week: 0.0 oz  . Drug use: No  . Sexual activity: Not Currently    Birth control/protection: None  Other Topics Concern  . None  Social History Narrative  . None    Family History  Problem Relation Age of Onset  . Migraines Sister   . Hypertension Maternal Grandmother   . Skin cancer Maternal Grandmother   . Heart disease Maternal Grandfather     Medications:       Current Outpatient Medications:  .  albuterol (VENTOLIN HFA) 108 (90 Base) MCG/ACT inhaler, Inhale 2 puffs into the lungs every 6 (six) hours as needed for wheezing or shortness of breath. , Disp: , Rfl:  .  aspirin EC 81 MG tablet, Take 81 mg by mouth daily., Disp: , Rfl:  .  cyclobenzaprine (FLEXERIL) 10 MG tablet, Take 0.5-1 tablets (5-10 mg total) 3 (three) times daily as needed by mouth for muscle spasms. (Patient not taking: Reported on 02/04/2018), Disp: 30 tablet, Rfl: 0 .  ferrous sulfate 325 (65 FE) MG tablet, Take by mouth., Disp: , Rfl:  .  gabapentin (NEURONTIN) 300 MG capsule, Take 1  tablet day #1. 1 tablet twice a day day #2 then 1 tablet 3 times daily, Disp: 90 capsule, Rfl: 1 .  ibuprofen (ADVIL,MOTRIN) 800 MG tablet, Take by mouth., Disp: , Rfl:  .  LORazepam (ATIVAN) 0.5 MG tablet, Take 1 tablet (0.5 mg total) by mouth every 8 (eight) hours as needed for anxiety., Disp: 90 tablet, Rfl: 0 .  Pediatric Multiple Vit-C-FA (FLINSTONES GUMMIES OMEGA-3 DHA PO), Take 2 tablets by mouth daily. Takes 2 daily , Disp: , Rfl:  .  promethazine (PHENERGAN) 12.5 MG suppository,  Place 1 suppository (12.5 mg total) rectally every 6 (six) hours as needed for nausea or vomiting. (Patient not taking: Reported on 01/07/2018), Disp: 24 each, Rfl: 6 .  zolpidem (AMBIEN) 5 MG tablet, Take 1 tablet (5 mg total) by mouth at bedtime as needed for sleep., Disp: 30 tablet, Rfl: 3  Objective Blood pressure 100/60, pulse 98, height 5' 3.2" (1.605 m), weight 132 lb (59.9 kg), not currently breastfeeding.  Gen WDWN NAD Back no trigger points   abdominal exam is benign incision is clean dry and intact Evaluation both of her legs revealed warm scan with no rashes no lesions On examination of her feet there are no lesions no rashes no color changes there is neither hot nor cold just normal She states that her feet feel better when I squeeze them tightly  Pertinent ROS No burning with urination, frequency or urgency No nausea, vomiting or diarrhea Nor fever chills or other constitutional symptoms   Labs or studies     Impression Diagnoses this Encounter::   ICD-10-CM   1. Bilateral foot pain M79.671    M79.672    neuropathic  2. Adjustment insomnia F51.02     Established relevant diagnosis(es):   Plan/Recommendations: Meds ordered this encounter  Medications  . gabapentin (NEURONTIN) 300 MG capsule    Sig: Take 1 tablet day #1. 1 tablet twice a day day #2 then 1 tablet 3 times daily    Dispense:  90 capsule    Refill:  1  . DISCONTD: zolpidem (AMBIEN) 5 MG tablet    Sig: Take 2  tablets (10 mg total) by mouth at bedtime as needed for sleep.    Dispense:  30 tablet    Refill:  3  . DISCONTD: zolpidem (AMBIEN) 5 MG tablet    Sig: Take 2 tablets (10 mg total) by mouth at bedtime as needed for sleep.    Dispense:  14 tablet    Refill:  3  . zolpidem (AMBIEN) 5 MG tablet    Sig: Take 1 tablet (5 mg total) by mouth at bedtime as needed for sleep.    Dispense:  30 tablet    Refill:  3    Labs or Scans Ordered: No orders of the defined types were placed in this encounter.   Management:: This is certainly an unusual presentation of foot pain It does not follow a dermatome pattern It does not appear to be a vascular issue or vasculopathy There is no trauma And is interesting that the feels better if she compresses her feet  As a result I am going to assume that this is a neurogenic pain how it is related to surgery or pregnancy I am completely at a loss because it does not follow a dermatome pattern that would be consistent with a sciatic nerve compression or any other kind of nerve compression because his bilateral and its a ankle sock distribution of pain which makes no neurogenic sense from a lesion or compression higher up  She is started on Neurontin 300 mg 3 times a day ultimately after a ramp up and given Ambien for sleep  We will see her back in 2 weeks for follow-up of this unusual presentation with  Follow up Return for keep scheduled.        Face to face time:  15 minutes  Greater than 50% of the visit time was spent in counseling and coordination of care with the patient.  The summary and outline of the counseling  and care coordination is summarized in the note above.   All questions were answered.

## 2018-02-04 ENCOUNTER — Ambulatory Visit (INDEPENDENT_AMBULATORY_CARE_PROVIDER_SITE_OTHER): Payer: Commercial Managed Care - PPO | Admitting: Obstetrics & Gynecology

## 2018-02-04 ENCOUNTER — Other Ambulatory Visit: Payer: Self-pay

## 2018-02-04 ENCOUNTER — Encounter: Payer: Self-pay | Admitting: Obstetrics & Gynecology

## 2018-02-04 VITALS — BP 92/60 | HR 97 | Ht 63.0 in | Wt 133.0 lb

## 2018-02-04 DIAGNOSIS — F419 Anxiety disorder, unspecified: Secondary | ICD-10-CM | POA: Diagnosis not present

## 2018-02-04 DIAGNOSIS — M79672 Pain in left foot: Secondary | ICD-10-CM | POA: Diagnosis not present

## 2018-02-04 DIAGNOSIS — M79671 Pain in right foot: Secondary | ICD-10-CM

## 2018-02-04 DIAGNOSIS — F5102 Adjustment insomnia: Secondary | ICD-10-CM

## 2018-02-04 MED ORDER — LORAZEPAM 0.5 MG PO TABS: 0.5000 mg | ORAL_TABLET | Freq: Three times a day (TID) | ORAL | 0 refills | Status: DC | PRN

## 2018-02-04 NOTE — Progress Notes (Signed)
Chief Complaint  Patient presents with  . Follow-up      22 y.o. [id number] No LMP recorded. The current method of family planning is none.  Outpatient Encounter Medications as of 02/04/2018  Medication Sig  . albuterol (VENTOLIN HFA) 108 (90 Base) MCG/ACT inhaler Inhale 2 puffs into the lungs every 6 (six) hours as needed for wheezing or shortness of breath.   . ferrous sulfate 325 (65 FE) MG tablet Take by mouth.  . gabapentin (NEURONTIN) 300 MG capsule Take 1 tablet day #1. 1 tablet twice a day day #2 then 1 tablet 3 times daily  . ibuprofen (ADVIL,MOTRIN) 800 MG tablet Take by mouth.  . Pediatric Multiple Vit-C-FA (FLINSTONES GUMMIES OMEGA-3 DHA PO) Take 2 tablets by mouth daily. Takes 2 daily   . zolpidem (AMBIEN) 5 MG tablet Take 1 tablet (5 mg total) by mouth at bedtime as needed for sleep.  Marland Kitchen aspirin EC 81 MG tablet Take 81 mg by mouth daily.  . cyclobenzaprine (FLEXERIL) 10 MG tablet Take 0.5-1 tablets (5-10 mg total) 3 (three) times daily as needed by mouth for muscle spasms. (Patient not taking: Reported on 02/04/2018)  . LORazepam (ATIVAN) 0.5 MG tablet Take 1 tablet (0.5 mg total) by mouth every 8 (eight) hours as needed for anxiety.  . promethazine (PHENERGAN) 12.5 MG suppository Place 1 suppository (12.5 mg total) rectally every 6 (six) hours as needed for nausea or vomiting. (Patient not taking: Reported on 01/07/2018)  . [DISCONTINUED] docusate sodium (COLACE) 100 MG capsule Take 1 capsule (100 mg total) 2 (two) times daily as needed by mouth. (Patient not taking: Reported on 12/15/2017)  . [DISCONTINUED] enoxaparin (LOVENOX) 40 MG/0.4ML injection Inject 0.4 mLs (40 mg total) daily into the skin. (Patient not taking: Reported on 01/24/2018)  . [DISCONTINUED] oxyCODONE-acetaminophen (PERCOCET/ROXICET) 5-325 MG tablet Take 1 tablet by mouth every 4 (four) hours as needed for severe pain. (Patient not taking: Reported on 01/24/2018)   No facility-administered encounter  medications on file as of 02/04/2018.     Subjective Carol Bernard states that she is feeling so much better with her feet and she still has a little bit of discomfort but it is much much better Her sleep is still problematic but is a little bit better she knows it is mostly stress from the girls and feels like that it will improve as time goes on Otherwise she is doing pretty well is not having to wear the compression garments on her feet and ankles anymore Past Medical History:  Diagnosis Date  . Anxiety and depression   . Asthma   . Birth defect   . Cyst of left kidney   . Dysrhythmia    h/o SVT  . GERD (gastroesophageal reflux disease)   . Headache   . Hypoglycemia   . Lupus   . Miscarriage   . Palpitations     Past Surgical History:  Procedure Laterality Date  . CESAREAN SECTION    . DILATION AND CURETTAGE OF UTERUS N/A 01/29/2017   Procedure: SUCTION DILATATION AND CURETTAGE;  Surgeon: Tilda Burrow, MD;  Location: [facility];  Service: Gynecology;  Laterality: N/A;  . DILATION AND CURETTAGE OF UTERUS N/A 05/01/2017   Procedure: SUCTION DILATATION AND CURETTAGE;  Surgeon: Lazaro Arms, MD;  Location: [facility];  Service: Gynecology;  Laterality: N/A;  pt to arrive at 7:15 to have labwork  . FETOSCOPIC LASER PHOTOCOAGULATION  11/14/2017   of 34 placental vessels for TTTS  .  WISDOM TOOTH EXTRACTION      OB History    Gravida Para Term Preterm AB Living   6 1   1 5 2    SAB TAB Ectopic Multiple Live Births   4     1        Allergies  Allergen Reactions  . Pecan Nut (Diagnostic) Hives  . Reglan [Metoclopramide] Hives    Social History   Socioeconomic History  . Marital status: Single    Spouse name: None  . Number of children: None  . Years of education: None  . Highest education level: None  Social Needs  . Financial resource strain: None  . Food insecurity - worry: None  . Food insecurity - inability: None  . Transportation needs - medical: None  .  Transportation needs - non-medical: None  Occupational History  . None  Tobacco Use  . Smoking status: Never Smoker  . Smokeless tobacco: Never Used  Substance and Sexual Activity  . Alcohol use: No    Alcohol/week: 0.0 oz  . Drug use: No  . Sexual activity: Not Currently    Birth control/protection: None  Other Topics Concern  . None  Social History Narrative  . None    Family History  Problem Relation Age of Onset  . Migraines Sister   . Hypertension Maternal Grandmother   . Skin cancer Maternal Grandmother   . Heart disease Maternal Grandfather     Medications:       Current Outpatient Medications:  .  albuterol (VENTOLIN HFA) 108 (90 Base) MCG/ACT inhaler, Inhale 2 puffs into the lungs every 6 (six) hours as needed for wheezing or shortness of breath. , Disp: , Rfl:  .  ferrous sulfate 325 (65 FE) MG tablet, Take by mouth., Disp: , Rfl:  .  gabapentin (NEURONTIN) 300 MG capsule, Take 1 tablet day #1. 1 tablet twice a day day #2 then 1 tablet 3 times daily, Disp: 90 capsule, Rfl: 1 .  ibuprofen (ADVIL,MOTRIN) 800 MG tablet, Take by mouth., Disp: , Rfl:  .  Pediatric Multiple Vit-C-FA (FLINSTONES GUMMIES OMEGA-3 DHA PO), Take 2 tablets by mouth daily. Takes 2 daily , Disp: , Rfl:  .  zolpidem (AMBIEN) 5 MG tablet, Take 1 tablet (5 mg total) by mouth at bedtime as needed for sleep., Disp: 30 tablet, Rfl: 3 .  aspirin EC 81 MG tablet, Take 81 mg by mouth daily., Disp: , Rfl:  .  cyclobenzaprine (FLEXERIL) 10 MG tablet, Take 0.5-1 tablets (5-10 mg total) 3 (three) times daily as needed by mouth for muscle spasms. (Patient not taking: Reported on 02/04/2018), Disp: 30 tablet, Rfl: 0 .  LORazepam (ATIVAN) 0.5 MG tablet, Take 1 tablet (0.5 mg total) by mouth every 8 (eight) hours as needed for anxiety., Disp: 90 tablet, Rfl: 0 .  promethazine (PHENERGAN) 12.5 MG suppository, Place 1 suppository (12.5 mg total) rectally every 6 (six) hours as needed for nausea or vomiting. (Patient not  taking: Reported on 01/07/2018), Disp: 24 each, Rfl: 6  Objective Blood pressure 92/60, pulse 97, height 5\' 3"  (1.6 m), weight 133 lb (60.3 kg), not currently breastfeeding.  General well-developed well-nourished no acute distress Feet exam bilaterally is as it was 2 weeks ago but no pain doing much better  Pertinent ROS Some irregular spotting No burning with urination, frequency or urgency No nausea, vomiting or diarrhea Nor fever chills or other constitutional symptoms   Labs or studies none    Impression Diagnoses this Encounter::  ICD-10-CM   1. Bilateral foot pain M79.671    M79.672   2. Adjustment insomnia F51.02   3. Anxiety F41.9     Established relevant diagnosis(es):   Plan/Recommendations: Meds ordered this encounter  Medications  . LORazepam (ATIVAN) 0.5 MG tablet    Sig: Take 1 tablet (0.5 mg total) by mouth every 8 (eight) hours as needed for anxiety.    Dispense:  90 tablet    Refill:  0    Labs or Scans Ordered: No orders of the defined types were placed in this encounter.   Management:: We will continue her taking the Neurontin 300 mg 3 times a day I am also adding Ativan 0.5 mg every 8 hours as needed for anxiety hoping this will also help her sleep She has a history of depression and anxiety in the past very complex requiring multiple medications she does not think she needs that so we will been to see if this will be helpful Overall I think she is doing pretty well she confides in me if she has any worsening of her symptoms or issues so I think she is doing well overall given the circumstances with the girls in the NICU  Follow up Return in about 6 weeks (around 03/18/2018) for Follow up, with Dr Despina HiddenEure.        Face to face time:  15 minutes  Greater than 50% of the visit time was spent in counseling and coordination of care with the patient.  The summary and outline of the counseling and care coordination is summarized in the note  above.   All questions were answered.

## 2018-02-07 ENCOUNTER — Encounter: Payer: Self-pay | Admitting: Obstetrics & Gynecology

## 2018-02-14 ENCOUNTER — Encounter: Payer: Self-pay | Admitting: Obstetrics & Gynecology

## 2018-03-05 ENCOUNTER — Telehealth: Payer: Self-pay | Admitting: Cardiology

## 2018-03-05 NOTE — Telephone Encounter (Signed)
Numerous attempts to contact patient with recall letters. Unable to reach by telephone. with no success.   User: Time: StatusMegan Salon:    Slaughter, Vicky T [[id number]][[id number]] 10/15/2016 4:33 PM New [10]   [System] 10/31/2016 11:04 PM Notification Sent [20]   Geraldine ContrasSmith, Stephanie R [[id number]][[id number]] 05/15/2017 11:06 AM Notification Sent [20]   Zachary GeorgeSlaughter, Vicky T [[id number]][[id number]] 10/15/2017 10:11 AM Notification Sent [20]   Zachary GeorgeSlaughter, Vicky T [[id number]][[id number]] 03/05/2018 12:00 PM Notification Sent [20]

## 2018-03-14 ENCOUNTER — Telehealth: Payer: Self-pay | Admitting: *Deleted

## 2018-03-14 ENCOUNTER — Other Ambulatory Visit: Payer: Self-pay | Admitting: Obstetrics & Gynecology

## 2018-03-14 MED ORDER — PROGESTERONE MICRONIZED 200 MG PO CAPS: 200.0000 mg | ORAL_CAPSULE | Freq: Every day | ORAL | 11 refills | Status: DC

## 2018-03-14 MED ORDER — ENOXAPARIN SODIUM 40 MG/0.4ML ~~LOC~~ SOLN: 40.0000 mg | SUBCUTANEOUS | 11 refills | Status: DC

## 2018-03-14 NOTE — Telephone Encounter (Signed)
Per our conversation, prometrium + lovenox + Aspiring

## 2018-03-14 NOTE — Telephone Encounter (Signed)
Informed patient that Dr Despina HiddenEure sent in prescription for Lovenox and Prometrium. Also needed to start taking Aspirin. Verbalized understanding.

## 2018-03-14 NOTE — Telephone Encounter (Signed)
Patient called requesting Lovenox injections, she is pregnant.

## 2018-03-17 ENCOUNTER — Ambulatory Visit (INDEPENDENT_AMBULATORY_CARE_PROVIDER_SITE_OTHER): Payer: Commercial Managed Care - PPO

## 2018-03-17 ENCOUNTER — Other Ambulatory Visit: Payer: Self-pay | Admitting: Obstetrics & Gynecology

## 2018-03-17 DIAGNOSIS — N8312 Corpus luteum cyst of left ovary: Secondary | ICD-10-CM | POA: Diagnosis not present

## 2018-03-17 DIAGNOSIS — Z3A01 Less than 8 weeks gestation of pregnancy: Secondary | ICD-10-CM | POA: Diagnosis not present

## 2018-03-17 DIAGNOSIS — O3481 Maternal care for other abnormalities of pelvic organs, first trimester: Secondary | ICD-10-CM

## 2018-03-17 DIAGNOSIS — O3680X0 Pregnancy with inconclusive fetal viability, not applicable or unspecified: Secondary | ICD-10-CM

## 2018-03-17 NOTE — Progress Notes (Signed)
US TA/TV: ? 5 wks GS,  5 mm GS,no YS visualized,mult simple small cystic area w/in the endometrium,normal right ovary,complex left corpus luteal cyst 3 x 2.4 x 2.5 cm,no free fluid,pt is seeing Dr.Eure tomorrow at 11 am.

## 2018-03-18 ENCOUNTER — Encounter: Payer: Self-pay | Admitting: Obstetrics & Gynecology

## 2018-03-18 ENCOUNTER — Ambulatory Visit (INDEPENDENT_AMBULATORY_CARE_PROVIDER_SITE_OTHER): Payer: Commercial Managed Care - PPO | Admitting: Obstetrics & Gynecology

## 2018-03-18 VITALS — BP 118/62 | HR 88 | Ht 63.0 in | Wt 140.0 lb

## 2018-03-18 DIAGNOSIS — N96 Recurrent pregnancy loss: Secondary | ICD-10-CM

## 2018-03-18 DIAGNOSIS — Z3490 Encounter for supervision of normal pregnancy, unspecified, unspecified trimester: Secondary | ICD-10-CM

## 2018-03-18 DIAGNOSIS — R110 Nausea: Secondary | ICD-10-CM

## 2018-03-18 DIAGNOSIS — Z331 Pregnant state, incidental: Secondary | ICD-10-CM

## 2018-03-18 DIAGNOSIS — Z1389 Encounter for screening for other disorder: Secondary | ICD-10-CM

## 2018-03-18 NOTE — Progress Notes (Signed)
Chief Complaint  Patient presents with  . Follow-up    had ultrasound yesterday      23 y.o. [id number] Patient's last menstrual period was 02/07/2018 (approximate). The current method of family planning is none.  Outpatient Encounter Medications as of 03/18/2018  Medication Sig  . albuterol (VENTOLIN HFA) 108 (90 Base) MCG/ACT inhaler Inhale 2 puffs into the lungs every 6 (six) hours as needed for wheezing or shortness of breath.   Marland Kitchen. aspirin EC 81 MG tablet Take 81 mg by mouth daily.  Marland Kitchen. enoxaparin (LOVENOX) 40 MG/0.4ML injection Inject 0.4 mLs (40 mg total) into the skin daily.  Marland Kitchen. gabapentin (NEURONTIN) 300 MG capsule Take 1 tablet day #1. 1 tablet twice a day day #2 then 1 tablet 3 times daily  . Pediatric Multiple Vit-C-FA (FLINSTONES GUMMIES OMEGA-3 DHA PO) Take 2 tablets by mouth daily. Takes 2 daily   . progesterone (PROMETRIUM) 200 MG capsule Take 1 capsule (200 mg total) by mouth daily.  . cyclobenzaprine (FLEXERIL) 10 MG tablet Take 0.5-1 tablets (5-10 mg total) 3 (three) times daily as needed by mouth for muscle spasms. (Patient not taking: Reported on 02/04/2018)  . ferrous sulfate 325 (65 FE) MG tablet Take by mouth.  Marland Kitchen. ibuprofen (ADVIL,MOTRIN) 800 MG tablet Take by mouth.  Marland Kitchen. LORazepam (ATIVAN) 0.5 MG tablet Take 1 tablet (0.5 mg total) by mouth every 8 (eight) hours as needed for anxiety. (Patient not taking: Reported on 03/18/2018)  . promethazine (PHENERGAN) 12.5 MG suppository Place 1 suppository (12.5 mg total) rectally every 6 (six) hours as needed for nausea or vomiting. (Patient not taking: Reported on 01/07/2018)  . zolpidem (AMBIEN) 5 MG tablet Take 1 tablet (5 mg total) by mouth at bedtime as needed for sleep.   No facility-administered encounter medications on file as of 03/18/2018.     Subjective +HPT with confirmation here Sonogram too early to evaluate for IUP or viability, unsure of ovulation/LMP of course No bleeding Some nausea Past Medical History:    Diagnosis Date  . Anxiety and depression   . Asthma   . Birth defect   . Cyst of left kidney   . Dysrhythmia    h/o SVT  . GERD (gastroesophageal reflux disease)   . Headache   . Hypoglycemia   . Lupus   . Miscarriage   . Palpitations     Past Surgical History:  Procedure Laterality Date  . CESAREAN SECTION    . DILATION AND CURETTAGE OF UTERUS N/A 01/29/2017   Procedure: SUCTION DILATATION AND CURETTAGE;  Surgeon: Tilda BurrowJohn Ferguson V, MD;  Location: [facility];  Service: Gynecology;  Laterality: N/A;  . DILATION AND CURETTAGE OF UTERUS N/A 05/01/2017   Procedure: SUCTION DILATATION AND CURETTAGE;  Surgeon: Lazaro ArmsLuther H Eure, MD;  Location: [facility];  Service: Gynecology;  Laterality: N/A;  pt to arrive at 7:15 to have labwork  . FETOSCOPIC LASER PHOTOCOAGULATION  11/14/2017   of 34 placental vessels for TTTS  . WISDOM TOOTH EXTRACTION      OB History    Gravida Para Term Preterm AB Living   7 1   1 5 2    SAB TAB Ectopic Multiple Live Births   4     1        Allergies  Allergen Reactions  . Pecan Nut (Diagnostic) Hives  . Reglan [Metoclopramide] Hives    Social History   Socioeconomic History  . Marital status: Single    Spouse name: None  .  Number of children: None  . Years of education: None  . Highest education level: None  Social Needs  . Financial resource strain: None  . Food insecurity - worry: None  . Food insecurity - inability: None  . Transportation needs - medical: None  . Transportation needs - non-medical: None  Occupational History  . None  Tobacco Use  . Smoking status: Never Smoker  . Smokeless tobacco: Never Used  Substance and Sexual Activity  . Alcohol use: No    Alcohol/week: 0.0 oz  . Drug use: No  . Sexual activity: Not Currently    Birth control/protection: None  Other Topics Concern  . None  Social History Narrative  . None    Family History  Problem Relation Age of Onset  . Migraines Sister   . Hypertension Maternal Grandmother    . Skin cancer Maternal Grandmother   . Heart disease Maternal Grandfather     Medications:       Current Outpatient Medications:  .  albuterol (VENTOLIN HFA) 108 (90 Base) MCG/ACT inhaler, Inhale 2 puffs into the lungs every 6 (six) hours as needed for wheezing or shortness of breath. , Disp: , Rfl:  .  aspirin EC 81 MG tablet, Take 81 mg by mouth daily., Disp: , Rfl:  .  enoxaparin (LOVENOX) 40 MG/0.4ML injection, Inject 0.4 mLs (40 mg total) into the skin daily., Disp: 30 Syringe, Rfl: 11 .  gabapentin (NEURONTIN) 300 MG capsule, Take 1 tablet day #1. 1 tablet twice a day day #2 then 1 tablet 3 times daily, Disp: 90 capsule, Rfl: 1 .  Pediatric Multiple Vit-C-FA (FLINSTONES GUMMIES OMEGA-3 DHA PO), Take 2 tablets by mouth daily. Takes 2 daily , Disp: , Rfl:  .  progesterone (PROMETRIUM) 200 MG capsule, Take 1 capsule (200 mg total) by mouth daily., Disp: 30 capsule, Rfl: 11 .  cyclobenzaprine (FLEXERIL) 10 MG tablet, Take 0.5-1 tablets (5-10 mg total) 3 (three) times daily as needed by mouth for muscle spasms. (Patient not taking: Reported on 02/04/2018), Disp: 30 tablet, Rfl: 0 .  ferrous sulfate 325 (65 FE) MG tablet, Take by mouth., Disp: , Rfl:  .  ibuprofen (ADVIL,MOTRIN) 800 MG tablet, Take by mouth., Disp: , Rfl:  .  LORazepam (ATIVAN) 0.5 MG tablet, Take 1 tablet (0.5 mg total) by mouth every 8 (eight) hours as needed for anxiety. (Patient not taking: Reported on 03/18/2018), Disp: 90 tablet, Rfl: 0 .  promethazine (PHENERGAN) 12.5 MG suppository, Place 1 suppository (12.5 mg total) rectally every 6 (six) hours as needed for nausea or vomiting. (Patient not taking: Reported on 01/07/2018), Disp: 24 each, Rfl: 6 .  zolpidem (AMBIEN) 5 MG tablet, Take 1 tablet (5 mg total) by mouth at bedtime as needed for sleep., Disp: 30 tablet, Rfl: 3  Objective Blood pressure 118/62, pulse 88, height 5\' 3"  (1.6 m), weight 140 lb (63.5 kg), last menstrual period 02/07/2018, not currently  breastfeeding.  Gen WDWN NAD  Pertinent ROS   Labs or studies     Impression Diagnoses this Encounter::   ICD-10-CM   1. Recurrent pregnancy loss N96   2. Pregnant state, incidental Z33.1 POCT Urinalysis Dipstick  3. Screening for genitourinary condition Z13.89 POCT Urinalysis Dipstick  4. Pregnancy at early stage Z34.90     Established relevant diagnosis(es):   Plan/Recommendations: No orders of the defined types were placed in this encounter.   Labs or Scans Ordered: Orders Placed This Encounter  Procedures  . POCT Urinalysis Dipstick  Management:: See below  Follow up Return in about 2 weeks (around 04/01/2018) for vaginal sonogram + see Dr Despina Hidden.        Face to face time:  10 minutes  Greater than 50% of the visit time was spent in counseling and coordination of care with the patient.  The summary and outline of the counseling and care coordination is summarized in the note above.   All questions were answered.

## 2018-03-28 ENCOUNTER — Other Ambulatory Visit: Payer: Self-pay | Admitting: Obstetrics & Gynecology

## 2018-03-28 DIAGNOSIS — O3680X0 Pregnancy with inconclusive fetal viability, not applicable or unspecified: Secondary | ICD-10-CM

## 2018-03-31 ENCOUNTER — Ambulatory Visit (INDEPENDENT_AMBULATORY_CARE_PROVIDER_SITE_OTHER): Payer: Commercial Managed Care - PPO

## 2018-03-31 ENCOUNTER — Ambulatory Visit (INDEPENDENT_AMBULATORY_CARE_PROVIDER_SITE_OTHER): Payer: Commercial Managed Care - PPO | Admitting: Obstetrics & Gynecology

## 2018-03-31 ENCOUNTER — Encounter: Payer: Self-pay | Admitting: Obstetrics & Gynecology

## 2018-03-31 VITALS — BP 98/62 | HR 84 | Ht 64.0 in | Wt 136.0 lb

## 2018-03-31 DIAGNOSIS — N96 Recurrent pregnancy loss: Secondary | ICD-10-CM

## 2018-03-31 DIAGNOSIS — N8312 Corpus luteum cyst of left ovary: Secondary | ICD-10-CM | POA: Diagnosis not present

## 2018-03-31 DIAGNOSIS — O208 Other hemorrhage in early pregnancy: Secondary | ICD-10-CM

## 2018-03-31 DIAGNOSIS — D6862 Lupus anticoagulant syndrome: Secondary | ICD-10-CM | POA: Diagnosis not present

## 2018-03-31 DIAGNOSIS — O3481 Maternal care for other abnormalities of pelvic organs, first trimester: Secondary | ICD-10-CM | POA: Diagnosis not present

## 2018-03-31 DIAGNOSIS — Z3A01 Less than 8 weeks gestation of pregnancy: Secondary | ICD-10-CM | POA: Diagnosis not present

## 2018-03-31 DIAGNOSIS — D6861 Antiphospholipid syndrome: Secondary | ICD-10-CM | POA: Diagnosis not present

## 2018-03-31 DIAGNOSIS — O3680X0 Pregnancy with inconclusive fetal viability, not applicable or unspecified: Secondary | ICD-10-CM

## 2018-03-31 NOTE — Progress Notes (Signed)
Follow up appointment for results  Chief Complaint  Patient presents with  . Follow-up    Blood pressure 98/62, pulse 84, height 5\' 4"  (1.626 m), weight 136 lb (61.7 kg), last menstrual period 02/07/2018, not currently breastfeeding.  [place] Ob Comp Less 14 Wks  Result Date: 03/31/2018 DATING AND VIABILITY SONOGRAM/F/U Carol Bernard is a 23 y.o. year old [id number] with LMP 02/07/2018 which would correlate to  40575w3d weeks gestation.  She has regular menstrual cycles.   She is here today for a confirmatory initial sonogram. GESTATION: SINGLETON   FETAL ACTIVITY:          Heart rate         114 bpm          CERVIX: Appears closed ADNEXA: Normal right ovary,complex corpus luteal cyst left 2.6 x 2.5 x 1.9 cm GESTATIONAL AGE AND  BIOMETRICS: Gestational criteria: Estimated Date of Delivery: 11/14/18 by LMP now at 58575w3d Previous Scans:2    CROWN RUMP LENGTH           6.35 mm         6+3 weeks                                                                      AVERAGE EGA(BY THIS SCAN):  6+3 weeks WORKING EDD( early ultrasound ):  11/21/2018  TECHNICIAN COMMENTS: US 6+3 wks,single IUP w/ys,positive fht 114 bpm,normal right ovary,complex corpus luteal cyst left 2.6 x 2.5 x 1.9 cm,crl 6.35 mm,subchorionic hemorrhage 2.2 x 1.9 x .9 cm A copy of this report including all images has been saved and backed up to a second source for retrieval if needed. All measures and details of the anatomical scan, placentation, fluid volume and pelvic anatomy are contained in that report. Amber Flora LippsJ Carl 03/31/2018 9:26 AM Clinical Impression and recommendations: I have reviewed the sonogram results above, combined with the patient's current clinical course, below are my impressions and any appropriate recommendations for management based on the sonographic findings. Viable early IUP [id number] Estimated Date of Delivery: 11/14/18 today's sonogram Normal general sonographic findings Recommend routine care unless otherwise clinically indicated Lazaro ArmsLuther  H Dovid Bartko 03/31/2018 9:41 AM    Viable IPP 5875w3d  Begin prenatal care 4 weeks  MEDS ordered this encounter: No orders of the defined types were placed in this encounter.   Orders for this encounter: No orders of the defined types were placed in this encounter.   Impression: History of recurrent miscarriages  Lupus anticoagulant syndrome (HCC)  Antiphospholipid antibody syndrome (HCC)    Plan: Continue with lovenox and baby aspirin and prometrium  Follow Up: Return in about 1 month (around 04/28/2018) for NOB visit + repeat sonogram.       Face to face time:  10 minutes  Greater than 50% of the visit time was spent in counseling and coordination of care with the patient.  The summary and outline of the counseling and care coordination is summarized in the note above.   All questions were answered.  Past Medical History:  Diagnosis Date  . Anxiety and depression   . Asthma   . Birth defect   . Cyst of left kidney   . Dysrhythmia    h/o SVT  .  GERD (gastroesophageal reflux disease)   . Headache   . Hypoglycemia   . Lupus (HCC)   . Miscarriage   . Palpitations     Past Surgical History:  Procedure Laterality Date  . CESAREAN SECTION    . DILATION AND CURETTAGE OF UTERUS N/A 01/29/2017   Procedure: SUCTION DILATATION AND CURETTAGE;  Surgeon: Tilda Burrow, MD;  Location: [facility];  Service: Gynecology;  Laterality: N/A;  . DILATION AND CURETTAGE OF UTERUS N/A 05/01/2017   Procedure: SUCTION DILATATION AND CURETTAGE;  Surgeon: Lazaro Arms, MD;  Location: [facility];  Service: Gynecology;  Laterality: N/A;  pt to arrive at 7:15 to have labwork  . FETOSCOPIC LASER PHOTOCOAGULATION  11/14/2017   of 34 placental vessels for TTTS  . WISDOM TOOTH EXTRACTION      OB History    Gravida  7   Para  1   Term      Preterm  1   AB  5   Living  2     SAB  4   TAB      Ectopic      Multiple  1   Live Births              Allergies  Allergen Reactions   . Pecan Nut (Diagnostic) Hives  . Reglan [Metoclopramide] Hives    Social History   Socioeconomic History  . Marital status: Single    Spouse name: Not on file  . Number of children: Not on file  . Years of education: Not on file  . Highest education level: Not on file  Occupational History  . Not on file  Social Needs  . Financial resource strain: Not on file  . Food insecurity:    Worry: Not on file    Inability: Not on file  . Transportation needs:    Medical: Not on file    Non-medical: Not on file  Tobacco Use  . Smoking status: Never Smoker  . Smokeless tobacco: Never Used  Substance and Sexual Activity  . Alcohol use: No    Alcohol/week: 0.0 oz  . Drug use: No  . Sexual activity: Not Currently    Birth control/protection: None  Lifestyle  . Physical activity:    Days per week: Not on file    Minutes per session: Not on file  . Stress: Not on file  Relationships  . Social connections:    Talks on phone: Not on file    Gets together: Not on file    Attends religious service: Not on file    Active member of club or organization: Not on file    Attends meetings of clubs or organizations: Not on file    Relationship status: Not on file  Other Topics Concern  . Not on file  Social History Narrative  . Not on file    Family History  Problem Relation Age of Onset  . Migraines Sister   . Hypertension Maternal Grandmother   . Skin cancer Maternal Grandmother   . Heart disease Maternal Grandfather

## 2018-03-31 NOTE — Progress Notes (Signed)
US 6+3 wks,single IUP w/ys,positive fht 114 bpm,normal right ovary,complex corpus luteal cyst left 2.6 x 2.5 x 1.9 cm,crl 6.35 mm,subchorionic hemorrhage 2.2 x 1.9 x .9 cm,EDD 11/21/2018 by [place]

## 2018-04-01 ENCOUNTER — Other Ambulatory Visit: Payer: Commercial Managed Care - PPO

## 2018-04-01 ENCOUNTER — Ambulatory Visit: Payer: Commercial Managed Care - PPO | Admitting: Obstetrics & Gynecology

## 2018-04-25 ENCOUNTER — Other Ambulatory Visit: Payer: Self-pay | Admitting: Obstetrics & Gynecology

## 2018-04-25 DIAGNOSIS — O3680X0 Pregnancy with inconclusive fetal viability, not applicable or unspecified: Secondary | ICD-10-CM

## 2018-04-28 ENCOUNTER — Ambulatory Visit (INDEPENDENT_AMBULATORY_CARE_PROVIDER_SITE_OTHER): Payer: Commercial Managed Care - PPO

## 2018-04-28 ENCOUNTER — Encounter: Payer: Self-pay | Admitting: Women's Health

## 2018-04-28 ENCOUNTER — Ambulatory Visit (INDEPENDENT_AMBULATORY_CARE_PROVIDER_SITE_OTHER): Payer: Commercial Managed Care - PPO | Admitting: Women's Health

## 2018-04-28 ENCOUNTER — Other Ambulatory Visit (HOSPITAL_COMMUNITY)
Admission: RE | Admit: 2018-04-28 | Discharge: 2018-04-28 | Disposition: A | Discharge status: Home / Self Care | Payer: Medicaid Other | Source: Ambulatory Visit | Attending: Obstetrics & Gynecology | Admitting: Obstetrics & Gynecology

## 2018-04-28 ENCOUNTER — Ambulatory Visit: Payer: Commercial Managed Care - PPO | Admitting: *Deleted

## 2018-04-28 VITALS — BP 100/60 | HR 99 | Wt 136.6 lb

## 2018-04-28 DIAGNOSIS — Z3401 Encounter for supervision of normal first pregnancy, first trimester: Secondary | ICD-10-CM

## 2018-04-28 DIAGNOSIS — Z3A10 10 weeks gestation of pregnancy: Secondary | ICD-10-CM

## 2018-04-28 DIAGNOSIS — N96 Recurrent pregnancy loss: Secondary | ICD-10-CM

## 2018-04-28 DIAGNOSIS — O09899 Supervision of other high risk pregnancies, unspecified trimester: Secondary | ICD-10-CM | POA: Insufficient documentation

## 2018-04-28 DIAGNOSIS — Z3481 Encounter for supervision of other normal pregnancy, first trimester: Secondary | ICD-10-CM | POA: Diagnosis not present

## 2018-04-28 DIAGNOSIS — Z3682 Encounter for antenatal screening for nuchal translucency: Secondary | ICD-10-CM

## 2018-04-28 DIAGNOSIS — O3680X0 Pregnancy with inconclusive fetal viability, not applicable or unspecified: Secondary | ICD-10-CM

## 2018-04-28 DIAGNOSIS — O09291 Supervision of pregnancy with other poor reproductive or obstetric history, first trimester: Secondary | ICD-10-CM | POA: Diagnosis not present

## 2018-04-28 DIAGNOSIS — Z1389 Encounter for screening for other disorder: Secondary | ICD-10-CM

## 2018-04-28 DIAGNOSIS — Z124 Encounter for screening for malignant neoplasm of cervix: Secondary | ICD-10-CM | POA: Diagnosis present

## 2018-04-28 DIAGNOSIS — O99119 Other diseases of the blood and blood-forming organs and certain disorders involving the immune mechanism complicating pregnancy, unspecified trimester: Secondary | ICD-10-CM

## 2018-04-28 DIAGNOSIS — O9989 Other specified diseases and conditions complicating pregnancy, childbirth and the puerperium: Secondary | ICD-10-CM

## 2018-04-28 DIAGNOSIS — O34219 Maternal care for unspecified type scar from previous cesarean delivery: Secondary | ICD-10-CM

## 2018-04-28 DIAGNOSIS — Z331 Pregnant state, incidental: Secondary | ICD-10-CM

## 2018-04-28 DIAGNOSIS — O0991 Supervision of high risk pregnancy, unspecified, first trimester: Secondary | ICD-10-CM

## 2018-04-28 DIAGNOSIS — O99111 Other diseases of the blood and blood-forming organs and certain disorders involving the immune mechanism complicating pregnancy, first trimester: Secondary | ICD-10-CM

## 2018-04-28 DIAGNOSIS — Z98891 History of uterine scar from previous surgery: Secondary | ICD-10-CM

## 2018-04-28 DIAGNOSIS — D6862 Lupus anticoagulant syndrome: Secondary | ICD-10-CM

## 2018-04-28 DIAGNOSIS — O0990 Supervision of high risk pregnancy, unspecified, unspecified trimester: Secondary | ICD-10-CM | POA: Insufficient documentation

## 2018-04-28 LAB — POCT URINALYSIS DIPSTICK
Blood, UA: NEGATIVE
GLUCOSE UA: NEGATIVE
KETONES UA: NEGATIVE
Leukocytes, UA: NEGATIVE
Nitrite, UA: NEGATIVE
Protein, UA: NEGATIVE

## 2018-04-28 NOTE — Progress Notes (Signed)
[place] 10+3 wks,measurements c/w dates,normal ovaries bilat,fhr 164 bpm,fundal placenta,crl 36.65 mm

## 2018-04-28 NOTE — Patient Instructions (Signed)
Carol Bernard, I greatly value your feedback.  If you receive a survey following your visit with [place] today, we appreciate you taking the time to fill it out.  Thanks, [name], CNM, WHNP-BC   Nausea & Vomiting  Have saltine crackers or pretzels by your bed and eat a few bites before you raise your head out of bed in the morning  Eat small frequent meals throughout the day instead of large meals  Drink plenty of fluids throughout the day to stay hydrated, just don't drink a lot of fluids with your meals.  This can make your stomach fill up faster making you feel sick  Do not brush your teeth right after you eat  Products with real ginger are good for nausea, like ginger ale and ginger hard candy Make sure it says made with real ginger!  Sucking on sour candy like lemon heads is also good for nausea  If your prenatal vitamins make you nauseated, take them at night so you will sleep through the nausea  Sea Bands  If you feel like you need medicine for the nausea & vomiting please let us know  If you are unable to keep any fluids or food down please let us know   Constipation  Drink plenty of fluid, preferably water, throughout the day  Eat foods high in fiber such as fruits, vegetables, and grains  Exercise, such as walking, is a good way to keep your bowels regular  Drink warm fluids, especially warm prune juice, or decaf coffee  Eat a 1/2 cup of real oatmeal (not instant), 1/2 cup applesauce, and 1/2-1 cup warm prune juice every day  If needed, you may take Colace (docusate sodium) stool softener once or twice a day to help keep the stool soft. If you are pregnant, wait until you are out of your first trimester (12-14 weeks of pregnancy)  If you still are having problems with constipation, you may take Miralax once daily as needed to help keep your bowels regular.  If you are pregnant, wait until you are out of your first trimester (12-14 weeks of pregnancy)   First Trimester  of Pregnancy The first trimester of pregnancy is from week 1 until the end of week 12 (months 1 through 3). A week after a sperm fertilizes an egg, the egg will implant on the wall of the uterus. This embryo will begin to develop into a baby. Genes from you and your partner are forming the baby. The female genes determine whether the baby is a boy or a girl. At 6-8 weeks, the eyes and face are formed, and the heartbeat can be seen on ultrasound. At the end of 12 weeks, all the baby's organs are formed.  Now that you are pregnant, you will want to do everything you can to have a healthy baby. Two of the most important things are to get good prenatal care and to follow your health care provider's instructions. Prenatal care is all the medical care you receive before the baby's birth. This care will help prevent, find, and treat any problems during the pregnancy and childbirth. BODY CHANGES Your body goes through many changes during pregnancy. The changes vary from woman to woman.   You may gain or lose a couple of pounds at first.  You may feel sick to your stomach (nauseous) and throw up (vomit). If the vomiting is uncontrollable, call your health care provider.  You may tire easily.  You may develop headaches  that can be relieved by medicines approved by your health care provider.  You may urinate more often. Painful urination may mean you have a bladder infection.  You may develop heartburn as a result of your pregnancy.  You may develop constipation because certain hormones are causing the muscles that push waste through your intestines to slow down.  You may develop hemorrhoids or swollen, bulging veins (varicose veins).  Your breasts may begin to grow larger and become tender. Your nipples may stick out more, and the tissue that surrounds them (areola) may become darker.  Your gums may bleed and may be sensitive to brushing and flossing.  Dark spots or blotches (chloasma, mask of  pregnancy) may develop on your face. This will likely fade after the baby is born.  Your menstrual periods will stop.  You may have a loss of appetite.  You may develop cravings for certain kinds of food.  You may have changes in your emotions from day to day, such as being excited to be pregnant or being concerned that something may go wrong with the pregnancy and baby.  You may have more vivid and strange dreams.  You may have changes in your hair. These can include thickening of your hair, rapid growth, and changes in texture. Some women also have hair loss during or after pregnancy, or hair that feels dry or thin. Your hair will most likely return to normal after your baby is born. WHAT TO EXPECT AT YOUR PRENATAL VISITS During a routine prenatal visit:  You will be weighed to make sure you and the baby are growing normally.  Your blood pressure will be taken.  Your abdomen will be measured to track your baby's growth.  The fetal heartbeat will be listened to starting around week 10 or 12 of your pregnancy.  Test results from any previous visits will be discussed. Your health care provider may ask you:  How you are feeling.  If you are feeling the baby move.  If you have had any abnormal symptoms, such as leaking fluid, bleeding, severe headaches, or abdominal cramping.  If you have any questions. Other tests that may be performed during your first trimester include:  Blood tests to find your blood type and to check for the presence of any previous infections. They will also be used to check for low iron levels (anemia) and Rh antibodies. Later in the pregnancy, blood tests for diabetes will be done along with other tests if problems develop.  Urine tests to check for infections, diabetes, or protein in the urine.  An ultrasound to confirm the proper growth and development of the baby.  An amniocentesis to check for possible genetic problems.  Fetal screens for spina  bifida and Down syndrome.  You may need other tests to make sure you and the baby are doing well. HOME CARE INSTRUCTIONS  Medicines  Follow your health care provider's instructions regarding medicine use. Specific medicines may be either safe or unsafe to take during pregnancy.  Take your prenatal vitamins as directed.  If you develop constipation, try taking a stool softener if your health care provider approves. Diet  Eat regular, well-balanced meals. Choose a variety of foods, such as meat or vegetable-based protein, fish, milk and low-fat dairy products, vegetables, fruits, and whole grain breads and cereals. Your health care provider will help you determine the amount of weight gain that is right for you.  Avoid raw meat and uncooked cheese. These carry germs that can  cause birth defects in the baby.  Eating four or five small meals rather than three large meals a day may help relieve nausea and vomiting. If you start to feel nauseous, eating a few soda crackers can be helpful. Drinking liquids between meals instead of during meals also seems to help nausea and vomiting.  If you develop constipation, eat more high-fiber foods, such as fresh vegetables or fruit and whole grains. Drink enough fluids to keep your urine clear or pale yellow. Activity and Exercise  Exercise only as directed by your health care provider. Exercising will help you:  Control your weight.  Stay in shape.  Be prepared for labor and delivery.  Experiencing pain or cramping in the lower abdomen or low back is a good sign that you should stop exercising. Check with your health care provider before continuing normal exercises.  Try to avoid standing for long periods of time. Move your legs often if you must stand in one place for a long time.  Avoid heavy lifting.  Wear low-heeled shoes, and practice good posture.  You may continue to have sex unless your health care provider directs you  otherwise. Relief of Pain or Discomfort  Wear a good support bra for breast tenderness.   Take warm sitz baths to soothe any pain or discomfort caused by hemorrhoids. Use hemorrhoid cream if your health care provider approves.   Rest with your legs elevated if you have leg cramps or low back pain.  If you develop varicose veins in your legs, wear support hose. Elevate your feet for 15 minutes, 3-4 times a day. Limit salt in your diet. Prenatal Care  Schedule your prenatal visits by the twelfth week of pregnancy. They are usually scheduled monthly at first, then more often in the last 2 months before delivery.  Write down your questions. Take them to your prenatal visits.  Keep all your prenatal visits as directed by your health care provider. Safety  Wear your seat belt at all times when driving.  Make a list of emergency phone numbers, including numbers for family, friends, the hospital, and police and fire departments. General Tips  Ask your health care provider for a referral to a local prenatal education class. Begin classes no later than at the beginning of month 6 of your pregnancy.  Ask for help if you have counseling or nutritional needs during pregnancy. Your health care provider can offer advice or refer you to specialists for help with various needs.  Do not use hot tubs, steam rooms, or saunas.  Do not douche or use tampons or scented sanitary pads.  Do not cross your legs for long periods of time.  Avoid cat litter boxes and soil used by cats. These carry germs that can cause birth defects in the baby and possibly loss of the fetus by miscarriage or stillbirth.  Avoid all smoking, herbs, alcohol, and medicines not prescribed by your health care provider. Chemicals in these affect the formation and growth of the baby.  Schedule a dentist appointment. At home, brush your teeth with a soft toothbrush and be gentle when you floss. SEEK MEDICAL CARE IF:   You have  dizziness.  You have mild pelvic cramps, pelvic pressure, or nagging pain in the abdominal area.  You have persistent nausea, vomiting, or diarrhea.  You have a bad smelling vaginal discharge.  You have pain with urination.  You notice increased swelling in your face, hands, legs, or ankles. SEEK IMMEDIATE MEDICAL CARE IF:  You have a fever.  You are leaking fluid from your vagina.  You have spotting or bleeding from your vagina.  You have severe abdominal cramping or pain.  You have rapid weight gain or loss.  You vomit blood or material that looks like coffee grounds.  You are exposed to [place] measles and have never had them.  You are exposed to fifth disease or chickenpox.  You develop a severe headache.  You have shortness of breath.  You have any kind of trauma, such as from a fall or a car accident. Document Released: 12/11/2001 Document Revised: 05/03/2014 Document Reviewed: 10/27/2013 [facility] Patient Information 2015 [place], [place]. This information is not intended to replace advice given to you by your health care provider. Make sure you discuss any questions you have with your health care provider.

## 2018-04-28 NOTE — Progress Notes (Signed)
INITIAL OBSTETRICAL VISIT Patient name: Carol Bernard MRN [id number]  Date of birth: Oct 11, 1995 Chief Complaint:   Initial Prenatal Visit  History of Present Illness:   Carol Bernard is a 23 y.o. [phone] Caucasian female at [redacted]w[redacted]d by 6wk u/s, with an Estimated Date of Delivery: 11/21/18 being seen today for her initial obstetrical visit.   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  Short-interval pregnancy, just had twins in Dec, they just got out of NICU, doing ok, both have had eye surgery. + Lupus anticoagulant, on Lovenox  daily, baby asa and prometrium d/t h/o SAB x 5.  Today she reports no complaints.  Patient's last menstrual period was 02/07/2018 (approximate). Last pap never. Results were: n/a Review of Systems:   Pertinent items are noted in HPI Denies cramping/contractions, leakage of fluid, vaginal bleeding, abnormal vaginal discharge w/ itching/odor/irritation, headaches, visual changes, shortness of breath, chest pain, abdominal pain, severe nausea/vomiting, or problems with urination or bowel movements unless otherwise stated above.  Pertinent History Reviewed:  Reviewed past medical,surgical, social, obstetrical and family history.  Reviewed problem list, medications and allergies. OB History  Gravida Para Term Preterm AB Living  SAB TAB Ectopic Multiple Live Births  # Outcome Date GA Lbr Len/2nd Weight Sex Delivery Anes PTL Lv  7 Current           6A Preterm 12/28/17 [redacted]w[redacted]d  1 lb 14 oz (0.85 kg) F CS-LTranv Spinal  LIV     Complications: Chorioamnionitis, Preterm premature rupture of membranes (PPROM) with unknown onset of labor  6B Preterm 12/28/17 [redacted]w[redacted]d  1 lb 8 oz (0.68 kg) F CS-LTranv Spinal  LIV     Complications: Preterm premature rupture of membranes (PPROM) with unknown onset of labor  5 SAB  05/01/17          4 SAB 01/29/17          3 Molar           2 SAB           1 SAB            Physical Assessment:   Vitals:   04/28/18 1447  BP: 100/60  Pulse: 99  Weight: 136 lb 9.6 oz (62 kg)  Body mass index is 23.45 kg/m.       Physical Examination:  General appearance - well appearing, and in no distress  Mental status - alert, oriented to person, place, and time  Psych:  She has a normal mood and affect  Skin - warm and dry, normal color, no suspicious lesions noted  Chest - effort normal, all lung fields clear to auscultation bilaterally  Heart - normal rate and regular rhythm  Abdomen - soft, nontender  Extremities:  No swelling or varicosities noted  Pelvic - VULVA: normal appearing vulva with no masses, tenderness or lesions  VAGINA: normal appearing vagina with normal color and discharge, no lesions  CERVIX: normal appearing cervix without discharge or lesions, no CMT  Thin prep pap is done w/ reflex HR HPV cotesting  Fetal Heart Rate (bpm): 164 u/s [place] 10+3 wks,measurements c/w dates,normal ovaries bilat,fhr 164 bpm,fundal placenta,crl 36.65 mm  Results for orders placed or performed in visit on 04/28/18 (from the past 24 hour(s))  POCT urinalysis dipstick   Collection Time: 04/28/18  3:04 PM  Result Value Ref Range   Color, UA     Clarity, UA     Glucose, UA neg    Bilirubin, UA     Ketones, UA neg    Spec Grav, UA  1.010 - 1.025   Blood, UA neg    pH, UA  5.0 - 8.0   Protein, UA neg    Urobilinogen, UA  0.2 or 1.0 E.U./dL   Nitrite, UA neg    Leukocytes, UA Negative Negative   Appearance     Odor      Assessment & Plan:  1) High-Risk Pregnancy [id number] at [redacted]w[redacted]d with an Estimated Date of Delivery: 11/21/18   2) Initial OB visit  3) H/O SAB x 5 w/ + lupus anticoagulant> continue Lovenox  daily, baby ASA, and prometrium  4) H/O LTCS @ 26wks d/t chorio/labor after PPROM @ 24wks w/ mono-di twins w/ TTS> wants TOLAC  Meds: No orders of the defined  types were placed in this encounter.   Initial labs obtained Continue prenatal vitamins Reviewed n/v relief measures and warning s/s to report Reviewed recommended weight gain based on pre-gravid BMI Encouraged well-balanced diet Genetic Screening discussed Integrated Screen: requested Cystic fibrosis screening discussed neg prev preg Ultrasound discussed; fetal survey: requested CCNC completed> spoke w/ [place]  Follow-up: Return in about 3 weeks (around 05/19/2018) for LROB, US:NT+1stIT.   Orders Placed This Encounter  Procedures  . Urine Culture  . US Fetal Nuchal Translucency Measurement  . Obstetric Panel, Including HIV  . Urinalysis, Routine w reflex microscopic  . Pain Management Screening Profile (10S)  . POCT urinalysis dipstick    Cheral Marker CNM, [facility] 04/28/2018 5:07 PM

## 2018-04-29 LAB — URINALYSIS, ROUTINE W REFLEX MICROSCOPIC
Bilirubin, UA: NEGATIVE
GLUCOSE, UA: NEGATIVE
Ketones, UA: NEGATIVE
Leukocytes, UA: NEGATIVE
Nitrite, UA: NEGATIVE
PH UA: 6 (ref 5.0–7.5)
Protein, UA: NEGATIVE
RBC, UA: NEGATIVE
Specific Gravity, UA: 1.022 (ref 1.005–1.030)
UUROB: 0.2 mg/dL (ref 0.2–1.0)

## 2018-04-29 LAB — OBSTETRIC PANEL, INCLUDING HIV
ANTIBODY SCREEN: NEGATIVE
BASOS: 0 %
Basophils Absolute: 0 10*3/uL (ref 0.0–0.2)
EOS (ABSOLUTE): 0.1 10*3/uL (ref 0.0–0.4)
EOS: 1 %
HEMATOCRIT: 35.5 % (ref 34.0–46.6)
HEMOGLOBIN: 12.1 g/dL (ref 11.1–15.9)
HEP B S AG: NEGATIVE
HIV Screen 4th Generation wRfx: NONREACTIVE
IMMATURE GRANS (ABS): 0 10*3/uL (ref 0.0–0.1)
IMMATURE GRANULOCYTES: 0 %
LYMPHS: 17 %
Lymphocytes Absolute: 1.4 10*3/uL (ref 0.7–3.1)
MCH: 28.3 pg (ref 26.6–33.0)
MCHC: 34.1 g/dL (ref 31.5–35.7)
MCV: 83 fL (ref 79–97)
MONOS ABS: 0.3 10*3/uL (ref 0.1–0.9)
Monocytes: 4 %
NEUTROS PCT: 78 %
Neutrophils Absolute: 6.5 10*3/uL (ref 1.4–7.0)
Platelets: 187 10*3/uL (ref 150–379)
RBC: 4.27 x10E6/uL (ref 3.77–5.28)
RDW: 14 % (ref 12.3–15.4)
RPR: NONREACTIVE
RUBELLA: 1.98 {index} (ref >0.99)
Rh Factor: POSITIVE
WBC: 8.3 10*3/uL (ref 3.4–10.8)

## 2018-04-29 LAB — PMP SCREEN PROFILE (10S), URINE
AMPHETAMINE SCREEN URINE: NEGATIVE ng/mL
BARBITURATE SCREEN URINE: NEGATIVE ng/mL
BENZODIAZEPINE SCREEN, URINE: NEGATIVE ng/mL
CANNABINOIDS UR QL SCN: NEGATIVE ng/mL
COCAINE(METAB.)SCREEN, URINE: NEGATIVE ng/mL
CREATININE(CRT), U: 122.3 mg/dL (ref 20.0–300.0)
METHADONE SCREEN, URINE: NEGATIVE ng/mL
OXYCODONE+OXYMORPHONE UR QL SCN: NEGATIVE ng/mL
Opiate Scrn, Ur: NEGATIVE ng/mL
PH UR, DRUG SCRN: 5.8 (ref 4.5–8.9)
PHENCYCLIDINE QUANTITATIVE URINE: NEGATIVE ng/mL
PROPOXYPHENE SCREEN URINE: NEGATIVE ng/mL

## 2018-04-30 LAB — CYTOLOGY - PAP
ADEQUACY: ABSENT
Chlamydia: NEGATIVE
Diagnosis: NEGATIVE
NEISSERIA GONORRHEA: NEGATIVE

## 2018-04-30 LAB — URINE CULTURE

## 2018-05-06 ENCOUNTER — Inpatient Hospital Stay (HOSPITAL_COMMUNITY)
Admission: AD | Admit: 2018-05-06 | Discharge: 2018-05-06 | Disposition: A | Discharge status: Home / Self Care | Payer: Medicaid Other | Source: Ambulatory Visit | Attending: Obstetrics and Gynecology | Admitting: Obstetrics and Gynecology

## 2018-05-06 ENCOUNTER — Encounter (HOSPITAL_COMMUNITY): Payer: Self-pay

## 2018-05-06 DIAGNOSIS — K219 Gastro-esophageal reflux disease without esophagitis: Secondary | ICD-10-CM | POA: Insufficient documentation

## 2018-05-06 DIAGNOSIS — O9989 Other specified diseases and conditions complicating pregnancy, childbirth and the puerperium: Secondary | ICD-10-CM | POA: Diagnosis not present

## 2018-05-06 DIAGNOSIS — M329 Systemic lupus erythematosus, unspecified: Secondary | ICD-10-CM | POA: Insufficient documentation

## 2018-05-06 DIAGNOSIS — J45909 Unspecified asthma, uncomplicated: Secondary | ICD-10-CM | POA: Insufficient documentation

## 2018-05-06 DIAGNOSIS — I471 Supraventricular tachycardia: Secondary | ICD-10-CM | POA: Insufficient documentation

## 2018-05-06 DIAGNOSIS — O09211 Supervision of pregnancy with history of pre-term labor, first trimester: Secondary | ICD-10-CM | POA: Diagnosis not present

## 2018-05-06 DIAGNOSIS — O219 Vomiting of pregnancy, unspecified: Secondary | ICD-10-CM

## 2018-05-06 DIAGNOSIS — Z7982 Long term (current) use of aspirin: Secondary | ICD-10-CM | POA: Insufficient documentation

## 2018-05-06 DIAGNOSIS — O26891 Other specified pregnancy related conditions, first trimester: Secondary | ICD-10-CM | POA: Diagnosis present

## 2018-05-06 DIAGNOSIS — O99611 Diseases of the digestive system complicating pregnancy, first trimester: Secondary | ICD-10-CM | POA: Diagnosis not present

## 2018-05-06 DIAGNOSIS — Z3A11 11 weeks gestation of pregnancy: Secondary | ICD-10-CM | POA: Insufficient documentation

## 2018-05-06 DIAGNOSIS — F329 Major depressive disorder, single episode, unspecified: Secondary | ICD-10-CM | POA: Diagnosis not present

## 2018-05-06 DIAGNOSIS — O99511 Diseases of the respiratory system complicating pregnancy, first trimester: Secondary | ICD-10-CM | POA: Insufficient documentation

## 2018-05-06 DIAGNOSIS — O210 Mild hyperemesis gravidarum: Secondary | ICD-10-CM | POA: Diagnosis present

## 2018-05-06 DIAGNOSIS — R51 Headache: Secondary | ICD-10-CM | POA: Insufficient documentation

## 2018-05-06 DIAGNOSIS — O99411 Diseases of the circulatory system complicating pregnancy, first trimester: Secondary | ICD-10-CM | POA: Diagnosis not present

## 2018-05-06 DIAGNOSIS — O99341 Other mental disorders complicating pregnancy, first trimester: Secondary | ICD-10-CM | POA: Diagnosis not present

## 2018-05-06 DIAGNOSIS — F419 Anxiety disorder, unspecified: Secondary | ICD-10-CM | POA: Diagnosis not present

## 2018-05-06 DIAGNOSIS — Z79899 Other long term (current) drug therapy: Secondary | ICD-10-CM | POA: Insufficient documentation

## 2018-05-06 LAB — URINALYSIS, ROUTINE W REFLEX MICROSCOPIC
Bilirubin Urine: NEGATIVE
GLUCOSE, UA: NEGATIVE mg/dL
HGB URINE DIPSTICK: NEGATIVE
Ketones, ur: NEGATIVE mg/dL
LEUKOCYTES UA: NEGATIVE
Nitrite: NEGATIVE
PH: 7.5 (ref 5.0–8.0)
PROTEIN: NEGATIVE mg/dL
SPECIFIC GRAVITY, URINE: 1.01 (ref 1.005–1.030)

## 2018-05-06 MED ORDER — ONDANSETRON 8 MG PO TBDP
8.0000 mg | ORAL_TABLET | Freq: Once | ORAL | Status: AC
  Administered 2018-05-06: 8 mg via ORAL
  Filled 2018-05-06: qty 1

## 2018-05-06 MED ORDER — BUTALBITAL-APAP-CAFFEINE 50-325-40 MG PO TABS: 2.0000 | ORAL_TABLET | Freq: Once | ORAL | Status: DC

## 2018-05-06 MED ORDER — ONDANSETRON 8 MG PO TBDP: 8.0000 mg | ORAL_TABLET | Freq: Three times a day (TID) | ORAL | 0 refills | Status: DC | PRN

## 2018-05-06 MED ORDER — BUTALBITAL-APAP-CAFFEINE 50-325-40 MG PO TABS: 1.0000 | ORAL_TABLET | Freq: Four times a day (QID) | ORAL | 0 refills | Status: DC | PRN

## 2018-05-06 NOTE — Discharge Instructions (Signed)

## 2018-05-06 NOTE — MAU Note (Signed)
Pt here with c/o headaches that are unrelieved with Tylenol, Ibuprofen or Excedrin Migraine. C/o nausea and vomiting the last week or two. Denies any bleeding or leaking.

## 2018-05-06 NOTE — MAU Provider Note (Signed)
History     CSN: [id number]  Arrival date and time: 05/06/18 0423   First Provider Initiated Contact with Patient 05/06/18 [phone]      Chief Complaint  Patient presents with  . Headache  . Emesis   HPI Carol Bernard is a 23 y.o. [phone] at [redacted]w[redacted]d who presents with a headache. She states she has had an ongoing issue with headaches since she delivered her twins at 28 weeks in December. She states she thought it was from stress but the headaches have continued. She reports trying tylenol and excedrin migraine with no relief. She rates the pain a 7/10. She states the headaches are causing her to vomit.   OB History    Gravida  7   Para  1   Term      Preterm  1   AB  5   Living  2     SAB  4   TAB      Ectopic      Multiple  1   Live Births  2           Past Medical History:  Diagnosis Date  . Anxiety and depression   . Asthma   . Birth defect   . Cyst of left kidney   . Dysrhythmia    h/o SVT  . GERD (gastroesophageal reflux disease)   . Headache   . Hypoglycemia   . Lupus (HCC)   . Miscarriage   . Neuropathic pain   . Palpitations     Past Surgical History:  Procedure Laterality Date  . CESAREAN SECTION    . DILATION AND CURETTAGE OF UTERUS N/A 01/29/2017   Procedure: SUCTION DILATATION AND CURETTAGE;  Surgeon: Tilda Burrow, MD;  Location: [facility];  Service: Gynecology;  Laterality: N/A;  . DILATION AND CURETTAGE OF UTERUS N/A 05/01/2017   Procedure: SUCTION DILATATION AND CURETTAGE;  Surgeon: Lazaro Arms, MD;  Location: [facility];  Service: Gynecology;  Laterality: N/A;  pt to arrive at 7:15 to have labwork  . FETOSCOPIC LASER PHOTOCOAGULATION  11/14/2017   of 34 placental vessels for TTTS  . WISDOM TOOTH EXTRACTION      Family History  Adopted: Yes  Problem Relation Age of Onset  . Migraines Sister   . Hypertension Maternal Grandmother   . Skin cancer Maternal Grandmother   . Heart disease Maternal Grandfather     Social History    Tobacco Use  . Smoking status: Never Smoker  . Smokeless tobacco: Never Used  Substance Use Topics  . Alcohol use: No    Alcohol/week: 0.0 oz  . Drug use: No    Allergies:  Allergies  Allergen Reactions  . Pecan Nut (Diagnostic) Hives  . Reglan [Metoclopramide] Hives    Medications Prior to Admission  Medication Sig Dispense Refill Last Dose  . albuterol (VENTOLIN HFA) 108 (90 Base) MCG/ACT inhaler Inhale 2 puffs into the lungs every 6 (six) hours as needed for wheezing or shortness of breath.    Not Taking  . aspirin EC 81 MG tablet Take 81 mg by mouth daily.   Taking  . cyclobenzaprine (FLEXERIL) 10 MG tablet Take 0.5-1 tablets (5-10 mg total) 3 (three) times daily as needed by mouth for muscle spasms. (Patient not taking: Reported on 02/04/2018) 30 tablet 0 Not Taking  . enoxaparin (LOVENOX) 40 MG/0.4ML injection Inject 0.4 mLs (40 mg total) into the skin daily. 30 Syringe 11 Taking  . LORazepam (ATIVAN) 0.5 MG tablet  Take 1 tablet (0.5 mg total) by mouth every 8 (eight) hours as needed for anxiety. (Patient not taking: Reported on 03/18/2018) 90 tablet 0 Not Taking  . Pediatric Multiple Vit-C-FA (FLINSTONES GUMMIES OMEGA-3 DHA PO) Take 2 tablets by mouth daily. Takes 2 daily    Taking  . progesterone (PROMETRIUM) 200 MG capsule Take 1 capsule (200 mg total) by mouth daily. 30 capsule 11 Taking  . promethazine (PHENERGAN) 12.5 MG suppository Place 1 suppository (12.5 mg total) rectally every 6 (six) hours as needed for nausea or vomiting. 24 each 6 Taking  . zolpidem (AMBIEN) 5 MG tablet Take 1 tablet (5 mg total) by mouth at bedtime as needed for sleep. 30 tablet 3 Taking    Review of Systems  Constitutional: Negative.  Negative for fatigue and fever.  HENT: Negative.   Eyes: Negative for visual disturbance.  Respiratory: Negative.  Negative for shortness of breath.   Cardiovascular: Negative.  Negative for chest pain.  Gastrointestinal: Positive for nausea and vomiting.  Negative for abdominal pain, constipation and diarrhea.  Genitourinary: Negative.  Negative for dysuria.  Neurological: Positive for headaches. Negative for dizziness.   Physical Exam   Blood pressure (!) 101/56, pulse 77, temperature 98.4 F (36.9 C), temperature source Oral, resp. rate 18, height  (1.626 m), weight 133 lb (60.3 kg), last menstrual period 02/07/2018, SpO2 99 %, not currently breastfeeding.  Physical Exam  Nursing note and vitals reviewed. Constitutional: She is oriented to person, place, and time. She appears well-developed and well-nourished. No distress.  HENT:  Head: Normocephalic.  Eyes: Pupils are equal, round, and reactive to light.  Cardiovascular: Normal rate, regular rhythm and normal heart sounds.  Respiratory: Effort normal and breath sounds normal. No respiratory distress.  GI: Soft. Bowel sounds are normal. She exhibits no distension. There is no tenderness.  Neurological: She is alert and oriented to person, place, and time. She has normal reflexes. She displays normal reflexes. No cranial nerve deficit. She exhibits normal muscle tone. Coordination normal.  Skin: Skin is warm and dry.  Psychiatric: She has a normal mood and affect. Her behavior is normal. Judgment and thought content normal.   FHT: 148 bpm  MAU Course  Procedures Results for orders placed or performed during the hospital encounter of 05/06/18 (from the past 24 hour(s))  Urinalysis, Routine w reflex microscopic     Status: None   Collection Time: 05/06/18  5:13 AM  Result Value Ref Range   Color, Urine YELLOW YELLOW   APPearance CLEAR CLEAR   Specific Gravity, Urine 1.010 1.005 - 1.030   pH 7.5 5.0 - 8.0   Glucose, UA NEGATIVE NEGATIVE mg/dL   Hgb urine dipstick NEGATIVE NEGATIVE   Bilirubin Urine NEGATIVE NEGATIVE   Ketones, ur NEGATIVE NEGATIVE mg/dL   Protein, ur NEGATIVE NEGATIVE mg/dL   Nitrite NEGATIVE NEGATIVE   Leukocytes, UA NEGATIVE NEGATIVE     MDM UA Zofran   ODT Patient driving self and unable to find a ride to pick her up if she receives medication while in MAU. Explained to patient that CNM is unable to give anything sedating since she is driving.   Assessment and Plan   1. Pregnancy headache in first trimester   2. [redacted] weeks gestation of pregnancy   3. Nausea and vomiting in pregnancy    -Discharge home in stable condition -Rx for fioricet and zofran given to patient -Patient advised to follow-up with [facility] as scheduled -Patient may return to MAU as  needed or if her condition were to change or worsen  Rolm Bookbinder CNM 05/06/2018, 5:20 AM

## 2018-05-19 ENCOUNTER — Other Ambulatory Visit: Payer: Self-pay | Admitting: Obstetrics & Gynecology

## 2018-05-23 ENCOUNTER — Other Ambulatory Visit: Payer: Commercial Managed Care - PPO

## 2018-05-23 ENCOUNTER — Encounter: Payer: Commercial Managed Care - PPO | Admitting: Obstetrics & Gynecology

## 2018-06-16 ENCOUNTER — Telehealth: Payer: Self-pay | Admitting: *Deleted

## 2018-06-17 NOTE — Telephone Encounter (Signed)
Patient states she thinks she had a miscarriage back in May but isn't for sure.  She has not been to the hospital or seen a provider regarding the bleeding.  I informed the patient that she could still have a positive pregnancy test but if all of the tissue was not passed could need a d&c. Spoke to Dr Despina HiddenEure who stated patient needed to be seen.  Will come see him tomorrow.

## 2018-06-18 ENCOUNTER — Encounter: Payer: Self-pay | Admitting: Obstetrics & Gynecology

## 2018-06-18 ENCOUNTER — Ambulatory Visit (INDEPENDENT_AMBULATORY_CARE_PROVIDER_SITE_OTHER): Payer: Commercial Managed Care - PPO | Admitting: Obstetrics & Gynecology

## 2018-06-18 ENCOUNTER — Other Ambulatory Visit: Payer: Self-pay

## 2018-06-18 VITALS — BP 133/74 | HR 97 | Wt 138.0 lb

## 2018-06-18 DIAGNOSIS — O034 Incomplete spontaneous abortion without complication: Secondary | ICD-10-CM | POA: Diagnosis not present

## 2018-06-18 MED ORDER — MISOPROSTOL 200 MCG PO TABS: ORAL_TABLET | ORAL | 0 refills | Status: DC

## 2018-06-18 NOTE — Progress Notes (Signed)
Chief Complaint  Patient presents with  . Vaginal Bleeding    last month      22 y.o. [id number] Patient's last menstrual period was 02/07/2018 (approximate). The current method of family planning is none.  Outpatient Encounter Medications as of 06/18/2018  Medication Sig  . albuterol (VENTOLIN HFA) 108 (90 Base) MCG/ACT inhaler Inhale 2 puffs into the lungs every 6 (six) hours as needed for wheezing or shortness of breath.   Marland Kitchen. aspirin EC 81 MG tablet Take 81 mg by mouth daily.  Marland Kitchen. enoxaparin (LOVENOX) 40 MG/0.4ML injection Inject 0.4 mLs (40 mg total) into the skin daily.  . Pediatric Multiple Vit-C-FA (FLINSTONES GUMMIES OMEGA-3 DHA PO) Take 2 tablets by mouth daily. Takes 2 daily   . butalbital-acetaminophen-caffeine (FIORICET, ESGIC) 50-325-40 MG tablet Take 1-2 tablets by mouth every 6 (six) hours as needed for headache. (Patient not taking: Reported on 06/18/2018)  . cyclobenzaprine (FLEXERIL) 10 MG tablet Take 0.5-1 tablets (5-10 mg total) 3 (three) times daily as needed by mouth for muscle spasms. (Patient not taking: Reported on 02/04/2018)  . LORazepam (ATIVAN) 0.5 MG tablet TAKE 1 TABLET (0.5 MG TOTAL) BY MOUTH EVERY 8 (EIGHT) HOURS AS NEEDED FOR ANXIETY. (Patient not taking: Reported on 06/18/2018)  . misoprostol (CYTOTEC) 200 MCG tablet Take all 4 tablets at once, repeat in 24 hours  . ondansetron (ZOFRAN ODT) 8 MG disintegrating tablet Take 1 tablet (8 mg total) by mouth every 8 (eight) hours as needed for nausea or vomiting. (Patient not taking: Reported on 06/18/2018)  . progesterone (PROMETRIUM) 200 MG capsule Take 1 capsule (200 mg total) by mouth daily. (Patient not taking: Reported on 06/18/2018)  . promethazine (PHENERGAN) 12.5 MG suppository Place 1 suppository (12.5 mg total) rectally every 6 (six) hours as needed for nausea or vomiting. (Patient not taking: Reported on 06/18/2018)  . zolpidem (AMBIEN) 5 MG tablet Take 1 tablet (5 mg total) by mouth at bedtime as needed  for sleep.   No facility-administered encounter medications on file as of 06/18/2018.     Subjective Carol Bernard called yesterday because she has been having some spotting and lower abdominal cramping since she had a spontaneous pregnancy loss about 3-4 weeks ago.  Brought in today for evaluation Past Medical History:  Diagnosis Date  . Anxiety and depression   . Asthma   . Birth defect   . Cyst of left kidney   . Dysrhythmia    h/o SVT  . GERD (gastroesophageal reflux disease)   . Headache   . Hypoglycemia   . Lupus (HCC)   . Miscarriage   . Neuropathic pain   . Palpitations     Past Surgical History:  Procedure Laterality Date  . CESAREAN SECTION    . DILATION AND CURETTAGE OF UTERUS N/A 01/29/2017   Procedure: SUCTION DILATATION AND CURETTAGE;  Surgeon: Tilda BurrowJohn Ferguson V, MD;  Location: [facility];  Service: Gynecology;  Laterality: N/A;  . DILATION AND CURETTAGE OF UTERUS N/A 05/01/2017   Procedure: SUCTION DILATATION AND CURETTAGE;  Surgeon: Lazaro ArmsLuther H Eure, MD;  Location: [facility];  Service: Gynecology;  Laterality: N/A;  pt to arrive at 7:15 to have labwork  . FETOSCOPIC LASER PHOTOCOAGULATION  11/14/2017   of 34 placental vessels for TTTS  . WISDOM TOOTH EXTRACTION      OB History    Gravida  7   Para  1   Term      Preterm  1   AB  5   Living  2     SAB  4   TAB      Ectopic      Multiple  1   Live Births  2           Allergies  Allergen Reactions  . Pecan Nut (Diagnostic) Hives  . Reglan [Metoclopramide] Hives    Social History   Socioeconomic History  . Marital status: Single    Spouse name: Not on file  . Number of children: Not on file  . Years of education: Not on file  . Highest education level: Not on file  Occupational History  . Not on file  Social Needs  . Financial resource strain: Not on file  . Food insecurity:    Worry: Not on file    Inability: Not on file  . Transportation needs:    Medical: Not on file     Non-medical: Not on file  Tobacco Use  . Smoking status: Never Smoker  . Smokeless tobacco: Never Used  Substance and Sexual Activity  . Alcohol use: No    Alcohol/week: 0.0 oz  . Drug use: No  . Sexual activity: Yes    Birth control/protection: None  Lifestyle  . Physical activity:    Days per week: Not on file    Minutes per session: Not on file  . Stress: Not on file  Relationships  . Social connections:    Talks on phone: Not on file    Gets together: Not on file    Attends religious service: Not on file    Active member of club or organization: Not on file    Attends meetings of clubs or organizations: Not on file    Relationship status: Not on file  Other Topics Concern  . Not on file  Social History Narrative  . Not on file    Family History  Adopted: Yes  Problem Relation Age of Onset  . Migraines Sister   . Hypertension Maternal Grandmother   . Skin cancer Maternal Grandmother   . Heart disease Maternal Grandfather     Medications:       Current Outpatient Medications:  .  albuterol (VENTOLIN HFA) 108 (90 Base) MCG/ACT inhaler, Inhale 2 puffs into the lungs every 6 (six) hours as needed for wheezing or shortness of breath. , Disp: , Rfl:  .  aspirin EC 81 MG tablet, Take 81 mg by mouth daily., Disp: , Rfl:  .  enoxaparin (LOVENOX) 40 MG/0.4ML injection, Inject 0.4 mLs (40 mg total) into the skin daily., Disp: 30 Syringe, Rfl: 11 .  Pediatric Multiple Vit-C-FA (FLINSTONES GUMMIES OMEGA-3 DHA PO), Take 2 tablets by mouth daily. Takes 2 daily , Disp: , Rfl:  .  butalbital-acetaminophen-caffeine (FIORICET, ESGIC) 50-325-40 MG tablet, Take 1-2 tablets by mouth every 6 (six) hours as needed for headache. (Patient not taking: Reported on 06/18/2018), Disp: 20 tablet, Rfl: 0 .  cyclobenzaprine (FLEXERIL) 10 MG tablet, Take 0.5-1 tablets (5-10 mg total) 3 (three) times daily as needed by mouth for muscle spasms. (Patient not taking: Reported on 02/04/2018), Disp: 30 tablet,  Rfl: 0 .  LORazepam (ATIVAN) 0.5 MG tablet, TAKE 1 TABLET (0.5 MG TOTAL) BY MOUTH EVERY 8 (EIGHT) HOURS AS NEEDED FOR ANXIETY. (Patient not taking: Reported on 06/18/2018), Disp: 90 tablet, Rfl: 0 .  misoprostol (CYTOTEC) 200 MCG tablet, Take all 4 tablets at once, repeat in 24 hours, Disp: 8 tablet, Rfl: 0 .  ondansetron (ZOFRAN ODT)  8 MG disintegrating tablet, Take 1 tablet (8 mg total) by mouth every 8 (eight) hours as needed for nausea or vomiting. (Patient not taking: Reported on 06/18/2018), Disp: 30 tablet, Rfl: 0 .  progesterone (PROMETRIUM) 200 MG capsule, Take 1 capsule (200 mg total) by mouth daily. (Patient not taking: Reported on 06/18/2018), Disp: 30 capsule, Rfl: 11 .  promethazine (PHENERGAN) 12.5 MG suppository, Place 1 suppository (12.5 mg total) rectally every 6 (six) hours as needed for nausea or vomiting. (Patient not taking: Reported on 06/18/2018), Disp: 24 each, Rfl: 6 .  zolpidem (AMBIEN) 5 MG tablet, Take 1 tablet (5 mg total) by mouth at bedtime as needed for sleep., Disp: 30 tablet, Rfl: 3  Objective Blood pressure 133/74, pulse 97, weight 138 lb (62.6 kg), last menstrual period 02/07/2018, not currently breastfeeding.  POC sonogram:  Small amount of complex tissue in the endometrium about 1.5 cm consistent with small amount retained prenancy tissue, decidua Uterus is not tender Adnexa is negative  Pertinent ROS No burning with urination, frequency or urgency No nausea, vomiting or diarrhea Nor fever chills or other constitutional symptoms   Labs or studies     Impression Diagnoses this Encounter::   ICD-10-CM   1. Incomplete abortion O03.4     Established relevant diagnosis(es):   Plan/Recommendations: Meds ordered this encounter  Medications  . misoprostol (CYTOTEC) 200 MCG tablet    Sig: Take all 4 tablets at once, repeat in 24 hours    Dispense:  8 tablet    Refill:  0    Labs or Scans Ordered: No orders of the defined types were placed in this  encounter.   Management:: cytotec for minimal amount of retained POC Re eval next week  Follow up Return in about 6 days (around 06/24/2018) for Follow up, with Dr Despina Hidden.      All questions were answered.

## 2018-06-19 ENCOUNTER — Encounter: Payer: Commercial Managed Care - PPO | Admitting: Obstetrics & Gynecology

## 2018-06-24 ENCOUNTER — Ambulatory Visit (INDEPENDENT_AMBULATORY_CARE_PROVIDER_SITE_OTHER): Payer: Commercial Managed Care - PPO | Admitting: Obstetrics & Gynecology

## 2018-06-24 ENCOUNTER — Encounter: Payer: Self-pay | Admitting: Obstetrics & Gynecology

## 2018-06-24 VITALS — BP 94/60 | HR 67 | Ht 64.0 in | Wt 136.0 lb

## 2018-06-24 DIAGNOSIS — O034 Incomplete spontaneous abortion without complication: Secondary | ICD-10-CM | POA: Diagnosis not present

## 2018-06-24 MED ORDER — DESOGESTREL-ETHINYL ESTRADIOL 0.15-0.02/0.01 MG (21/5) PO TABS: 1.0000 | ORAL_TABLET | Freq: Every day | ORAL | 11 refills | Status: DC

## 2018-06-24 NOTE — Progress Notes (Signed)
Follow up appointment for results  Chief Complaint  Patient presents with  . Follow-up    took cytotec    Blood pressure 94/60, pulse 67, height 5\' 4"  (1.626 m), weight 136 lb (61.7 kg), last menstrual period 02/07/2018, not currently breastfeeding.  POC sonogram reveals thin endometrial stripe, no POC tissue is noted today Complete resolution of the retained decidual tissue seen on sonogram last week  MEDS ordered this encounter: Meds ordered this encounter  Medications  . desogestrel-ethinyl estradiol (KARIVA,AZURETTE,MIRCETTE) 0.15-0.02/0.01 MG (21/5) tablet    Sig: Take 1 tablet by mouth daily.    Dispense:  1 Package    Refill:  11    Orders for this encounter: No orders of the defined types were placed in this encounter.   Impression: Retained decidua of pregnancy  Plan: Begin OCP as desired  Follow Up: Return if symptoms worsen or fail to improve.       Face to face time:  15 minutes  Greater than 50% of the visit time was spent in counseling and coordination of care with the patient.  The summary and outline of the counseling and care coordination is summarized in the note above.   All questions were answered.  Past Medical History:  Diagnosis Date  . Anxiety and depression   . Asthma   . Birth defect   . Cyst of left kidney   . Dysrhythmia    h/o SVT  . GERD (gastroesophageal reflux disease)   . Headache   . Hypoglycemia   . Lupus (HCC)   . Miscarriage   . Neuropathic pain   . Palpitations     Past Surgical History:  Procedure Laterality Date  . CESAREAN SECTION    . DILATION AND CURETTAGE OF UTERUS N/A 01/29/2017   Procedure: SUCTION DILATATION AND CURETTAGE;  Surgeon: Tilda BurrowJohn Ferguson V, MD;  Location: [facility];  Service: Gynecology;  Laterality: N/A;  . DILATION AND CURETTAGE OF UTERUS N/A 05/01/2017   Procedure: SUCTION DILATATION AND CURETTAGE;  Surgeon: Lazaro ArmsLuther H Lorina Duffner, MD;  Location: [facility];  Service: Gynecology;  Laterality: N/A;  pt to  arrive at 7:15 to have labwork  . FETOSCOPIC LASER PHOTOCOAGULATION  11/14/2017   of 34 placental vessels for TTTS  . WISDOM TOOTH EXTRACTION      OB History    Gravida  7   Para  1   Term      Preterm  1   AB  5   Living  2     SAB  4   TAB      Ectopic      Multiple  1   Live Births  2           Allergies  Allergen Reactions  . Pecan Nut (Diagnostic) Hives  . Reglan [Metoclopramide] Hives    Social History   Socioeconomic History  . Marital status: Single    Spouse name: Not on file  . Number of children: Not on file  . Years of education: Not on file  . Highest education level: Not on file  Occupational History  . Not on file  Social Needs  . Financial resource strain: Not on file  . Food insecurity:    Worry: Not on file    Inability: Not on file  . Transportation needs:    Medical: Not on file    Non-medical: Not on file  Tobacco Use  . Smoking status: Never Smoker  . Smokeless tobacco: Never Used  Substance and Sexual Activity  . Alcohol use: No    Alcohol/week: 0.0 oz  . Drug use: No  . Sexual activity: Not Currently    Birth control/protection: None  Lifestyle  . Physical activity:    Days per week: Not on file    Minutes per session: Not on file  . Stress: Not on file  Relationships  . Social connections:    Talks on phone: Not on file    Gets together: Not on file    Attends religious service: Not on file    Active member of club or organization: Not on file    Attends meetings of clubs or organizations: Not on file    Relationship status: Not on file  Other Topics Concern  . Not on file  Social History Narrative  . Not on file    Family History  Adopted: Yes  Problem Relation Age of Onset  . Migraines Sister   . Hypertension Maternal Grandmother   . Skin cancer Maternal Grandmother   . Heart disease Maternal Grandfather

## 2018-07-15 ENCOUNTER — Ambulatory Visit (INDEPENDENT_AMBULATORY_CARE_PROVIDER_SITE_OTHER): Payer: Commercial Managed Care - PPO | Admitting: General Surgery

## 2018-07-15 ENCOUNTER — Encounter: Payer: Self-pay | Admitting: General Surgery

## 2018-07-15 VITALS — BP 113/67 | HR 82 | Temp 97.8°F | Resp 16 | Wt 138.0 lb

## 2018-07-15 DIAGNOSIS — K8020 Calculus of gallbladder without cholecystitis without obstruction: Secondary | ICD-10-CM | POA: Diagnosis not present

## 2018-07-15 NOTE — Progress Notes (Signed)
[facility] History and Physical  Reason for Referral:Gallstones  Referring Physician:  Dr. Dimas Aguas   Chief Complaint    Cholelithiasis      Carol Bernard is a 23 y.o. female.  HPI: 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  She reports that since her pregnancy she has had a lot of epigastric/ RUQ pain that is worse with foods and that she has tried to modify her diet to stop greasy foods, but that the pain controls.  She has nausea but no vomiting, and says that her Bms can be loose at times.  She says at this point it does not matter what she eats because she will have the RUQ pain. She denies excessive NSAID use.  As far as her lupus anticoagulant + status, she was seen by a hematologist and her Ob, Dr. Despina Hidden, during her pregnancy and was told she only needed lovenox during pregnancy.  She has never had issues otherwise with any clotting problem or blood clots per her report.  She was treated due to her continued miscarriages and associate with lupus anticoagulate syndrome. She reports that her twins had twin to twin transfusion and she required an intrauterine ablation during the pregnancy.  She has had to be intubated with her prior D&C and did have some issues with asthma and stridor after her intubation.    Past Medical History:  Diagnosis Date  . Anxiety and depression   . Asthma   . Birth defect   . Cyst of left kidney   . Dysrhythmia    h/o SVT  . GERD (gastroesophageal reflux disease)   . Headache   . Hypoglycemia   . Lupus (HCC)   . Miscarriage   . Neuropathic pain   . Palpitations     Past Surgical History:  Procedure Laterality Date  . CESAREAN SECTION    . DILATION AND CURETTAGE OF UTERUS N/A 01/29/2017   Procedure: SUCTION DILATATION AND CURETTAGE;  Surgeon: Tilda Burrow, MD;  Location: [facility];  Service:  Gynecology;  Laterality: N/A;  . DILATION AND CURETTAGE OF UTERUS N/A 05/01/2017   Procedure: SUCTION DILATATION AND CURETTAGE;  Surgeon: Lazaro Arms, MD;  Location: [facility];  Service: Gynecology;  Laterality: N/A;  pt to arrive at 7:15 to have labwork  . FETOSCOPIC LASER PHOTOCOAGULATION  11/14/2017   of 34 placental vessels for TTTS  . WISDOM TOOTH EXTRACTION      Family History  Adopted: Yes  Problem Relation Age of Onset  . Migraines Sister   . Hypertension Maternal Grandmother   . Skin cancer Maternal Grandmother   . Heart disease Maternal Grandfather     Social History   Tobacco Use  . Smoking status: Never Smoker  . Smokeless tobacco: Never Used  Substance Use Topics  . Alcohol use: No    Alcohol/week: 0.0 oz  . Drug use: No    Medications: I have reviewed the patient's current medications. Prior to Admission:  (Not in a hospital admission) Scheduled: Continuous: PRN: Allergies as of 07/15/2018      Reactions   Pecan Nut (diagnostic) Hives   Reglan [metoclopramide] Hives      Medication List        Accurate as of 07/15/18 10:36 AM. Always use your most recent med list.          desogestrel-ethinyl estradiol  0.15-0.02/0.01 MG (21/5) tablet Commonly known as:  KARIVA,AZURETTE,MIRCETTE Take 1 tablet by mouth daily.   FLINSTONES GUMMIES OMEGA-3 DHA PO Take 2 tablets by mouth daily. Takes 2 daily   progesterone 200 MG capsule Commonly known as:  PROMETRIUM Take 1 capsule (200 mg total) by mouth daily.   promethazine 12.5 MG suppository Commonly known as:  PHENERGAN Place 1 suppository (12.5 mg total) rectally every 6 (six) hours as needed for nausea or vomiting.   VENTOLIN HFA 108 (90 Base) MCG/ACT inhaler Generic drug:  albuterol Inhale 2 puffs into the lungs every 6 (six) hours as needed for wheezing or shortness of breath.   zolpidem 5 MG tablet Commonly known as:  AMBIEN Take 1 tablet (5 mg total) by mouth at bedtime as needed for sleep.         ROS:  A comprehensive review of systems was negative except for: Constitutional: positive for headaches Gastrointestinal: positive for abdominal pain and nausea  Blood pressure 113/67, pulse 82, temperature 97.8 F (36.6 C), temperature source Temporal, resp. rate 16, weight 138 lb (62.6 kg), last menstrual period 02/07/2018, not currently breastfeeding. Physical Exam  Constitutional: She is oriented to person, place, and time. She appears well-developed.  HENT:  Head: Normocephalic and atraumatic.  Eyes: Pupils are equal, round, and reactive to light.  Neck: Normal range of motion.  Cardiovascular: Normal rate and regular rhythm.  Pulmonary/Chest: Effort normal and breath sounds normal.  Abdominal: Soft. She exhibits no distension. There is tenderness.  RUQ with deep palpation  Musculoskeletal: Normal range of motion. She exhibits no edema.  Neurological: She is alert and oriented to person, place, and time.  Skin: Skin is warm and dry.  Psychiatric: She has a normal mood and affect. Her behavior is normal. Judgment and thought content normal.  Vitals reviewed.   Results: US RUQ OSH: Stones, normal CBD, no fluid or thickening     Assessment & Plan:  Carol MeyerHaley M Bernard is a 23 y.o. female with cholelithiasis who continues to have pain. She has a history of lupus anticoagulate syndrome and is currently not on any lovenox.  I am unsure if she took herself off of this or if she came off of it after pregnancy as some of Dr. Forestine ChuteEure's notes indicate she should still be on anticoagulation but it appears she was [redacted] weeks pregnant at that visit on 04/28/18. She had a spontaneous abortion and took cytotec, which she told me nothing about, and was seen by him 06/24/18.    -I need to discuss with Dr. Despina HiddenEure whether this patient needs to be on any lovenox outside of pregnancy given increased risk for blood clots in the perioperative period  -She is currently scheduled for lap chole 7/24/ 19   PLAN:  I counseled the patient about the indication, risks and benefits of laparoscopic cholecystectomy.  She understands there is a very small chance for bleeding, infection, injury to normal structures (including common bile duct), conversion to open surgery, persistent symptoms, evolution of postcholecystectomy diarrhea, need for secondary interventions, anesthesia reaction, cardiopulmonary issues and other risks not specifically detailed here. I described the expected recovery, the plan for follow-up and the restrictions during the recovery phase.  All questions were answered.  All questions were answered to the satisfaction of the patient.   Carol RoersLindsay C Ebenezer Mccaskey 07/15/2018, 10:36 AM

## 2018-07-15 NOTE — Patient Instructions (Signed)
Laparoscopic Cholecystectomy Laparoscopic cholecystectomy is surgery to remove the gallbladder. The gallbladder is a pear-shaped organ that lies beneath the liver on the right side of the body. The gallbladder stores bile, which is a fluid that helps the body to digest fats. Cholecystectomy is often done for inflammation of the gallbladder (cholecystitis). This condition is usually caused by a buildup of gallstones (cholelithiasis) in the gallbladder. Gallstones can block the flow of bile, which can result in inflammation and pain. In severe cases, emergency surgery may be required. This procedure is done though small incisions in your abdomen (laparoscopic surgery). A thin scope with a camera (laparoscope) is inserted through one incision. Thin surgical instruments are inserted through the other incisions. In some cases, a laparoscopic procedure may be turned into a type of surgery that is done through a larger incision (open surgery). Tell a health care provider about:  Any allergies you have.  All medicines you are taking, including vitamins, herbs, eye drops, creams, and over-the-counter medicines.  Any problems you or family members have had with anesthetic medicines.  Any blood disorders you have.  Any surgeries you have had.  Any medical conditions you have.  Whether you are pregnant or may be pregnant. What are the risks? Generally, this is a safe procedure. However, problems may occur, including:  Infection.  Bleeding.  Allergic reactions to medicines.  Damage to other structures or organs.  A stone remaining in the common bile duct. The common bile duct carries bile from the gallbladder into the small intestine.  A bile leak from the cyst duct that is clipped when your gallbladder is removed.  What happens before the procedure? Staying hydrated Follow instructions from your health care provider about hydration, which may include:  Up to 2 hours before the procedure -  you may continue to drink clear liquids, such as water, clear fruit juice, black coffee, and plain tea.  Eating and drinking restrictions Follow instructions from your health care provider about eating and drinking, which may include:  8 hours before the procedure - stop eating heavy meals or foods such as meat, fried foods, or fatty foods.  6 hours before the procedure - stop eating light meals or foods, such as toast or cereal.  6 hours before the procedure - stop drinking milk or drinks that contain milk.  2 hours before the procedure - stop drinking clear liquids.  Medicines  Ask your health care provider about: ? Changing or stopping your regular medicines. This is especially important if you are taking diabetes medicines or blood thinners. ? Taking medicines such as aspirin and ibuprofen. These medicines can thin your blood. Do not take these medicines before your procedure if your health care provider instructs you not to.  You may be given antibiotic medicine to help prevent infection. General instructions  Let your health care provider know if you develop a cold or an infection before surgery.  Plan to have someone take you home from the hospital or clinic.  Ask your health care provider how your surgical site will be marked or identified. What happens during the procedure?  To reduce your risk of infection: ? Your health care team will wash or sanitize their hands. ? Your skin will be washed with soap. ? Hair may be removed from the surgical area.  An IV tube may be inserted into one of your veins.  You will be given one or more of the following: ? A medicine to help you relax (sedative). ?   A medicine to make you fall asleep (general anesthetic).  A breathing tube will be placed in your mouth.  Your surgeon will make several small cuts (incisions) in your abdomen.  The laparoscope will be inserted through one of the small incisions. The camera on the laparoscope  will send images to a TV screen (monitor) in the operating room. This lets your surgeon see inside your abdomen.  Air-like gas will be pumped into your abdomen. This will expand your abdomen to give the surgeon more room to perform the surgery.  Other tools that are needed for the procedure will be inserted through the other incisions. The gallbladder will be removed through one of the incisions.  Your common bile duct may be examined. If stones are found in the common bile duct, they may be removed.  After your gallbladder has been removed, the incisions will be closed with stitches (sutures), staples, or skin glue.  Your incisions may be covered with a bandage (dressing). The procedure may vary among health care providers and hospitals. What happens after the procedure?  Your blood pressure, heart rate, breathing rate, and blood oxygen level will be monitored until the medicines you were given have worn off.  You will be given medicines as needed to control your pain.  Do not drive for 24 hours if you were given a sedative. This information is not intended to replace advice given to you by your health care provider. Make sure you discuss any questions you have with your health care provider. Document Released: 12/17/2005 Document Revised: 07/08/2016 Document Reviewed: 06/04/2016 Elsevier Interactive Patient Education  2018 Elsevier Inc. Cholelithiasis Cholelithiasis is also called "gallstones." It is a kind of gallbladder disease. The gallbladder is an organ that stores a liquid (bile) that helps you digest fat. Gallstones may not cause symptoms (may be silent gallstones) until they cause a blockage, and then they can cause pain (gallbladder attack). Follow these instructions at home:  Take over-the-counter and prescription medicines only as told by your doctor.  Stay at a healthy weight.  Eat healthy foods. This includes: ? Eating fewer fatty foods, like fried foods. ? Eating  fewer refined carbs (refined carbohydrates). Refined carbs are breads and grains that are highly processed, like white bread and white rice. Instead, choose whole grains like whole-wheat bread and brown rice. ? Eating more fiber. Almonds, fresh fruit, and beans are healthy sources of fiber.  Keep all follow-up visits as told by your doctor. This is important. Contact a doctor if:  You have sudden pain in the upper right side of your belly (abdomen). Pain might spread to your right shoulder or your chest. This may be a sign of a gallbladder attack.  You feel sick to your stomach (are nauseous).  You throw up (vomit).  You have been diagnosed with gallstones that have no symptoms and you get: ? Belly pain. ? Discomfort, burning, or fullness in the upper part of your belly (indigestion). Get help right away if:  You have sudden pain in the upper right side of your belly, and it lasts for more than 2 hours.  You have belly pain that lasts for more than 5 hours.  You have a fever or chills.  You keep feeling sick to your stomach or you keep throwing up.  Your skin or the whites of your eyes turn yellow (jaundice).  You have dark-colored pee (urine).  You have light-colored poop (stool). Summary  Cholelithiasis is also called "gallstones."  The gallbladder   is an organ that stores a liquid (bile) that helps you digest fat.  Silent gallstones are gallstones that do not cause symptoms.  A gallbladder attack may cause sudden pain in the upper right side of your belly. Pain might spread to your right shoulder or your chest. If this happens, contact your doctor.  If you have sudden pain in the upper right side of your belly that lasts for more than 2 hours, get help right away. This information is not intended to replace advice given to you by your health care provider. Make sure you discuss any questions you have with your health care provider. Document Released: 06/04/2008 Document  Revised: 09/02/2016 Document Reviewed: 09/02/2016 Elsevier Interactive Patient Education  2017 Elsevier Inc.  

## 2018-07-16 ENCOUNTER — Telehealth: Payer: Self-pay | Admitting: General Surgery

## 2018-07-16 NOTE — Telephone Encounter (Addendum)
[facility]  Patient with lupus anticoagulant syndrome. Took lovenox during pregnancy 40mg  daily.  Confirmed with Dr. Despina HiddenEure that she only took in pregnancy and does not take otherwise. Discussed with Dr. Ellin SabaKatragadda, hematology, and given risk of clotting, recommends lovenox preop and post op for 4-5 days.    Following her surgery, will plan to do lovenox 40mg  shots daily.    Pain has been getting worse. Warned if she is getting worse and not able to keep any food down that she might have to go to the Ed to get gallbladder out as inpatient. For now she is planning/ trying to aim for Wednesday as scheduled.   Carol GreenhouseLindsay Jordyn Doane, MD [facility] [address] [place], [place] [id number] [phone] (office)

## 2018-07-16 NOTE — H&P (Signed)
[facility] History and Physical  Reason for Referral:Gallstones  Referring Physician:  Dr. Dimas Aguas      Chief Complaint    Cholelithiasis      Carol Bernard is a 23 y.o. female.  HPI: Carol Bernard is a 23 yo with a history of anxiety, multiple miscarriages, GERD, lupus anticoagulate positive requiring lovenox shots for pregnancy who had twins at [facility] 11/2017.  She reports that since her pregnancy she has had a lot of epigastric/ RUQ pain that is worse with foods and that she has tried to modify her diet to stop greasy foods, but that the pain controls.  She has nausea but no vomiting, and says that her Bms can be loose at times.  She says at this point it does not matter what she eats because she will have the RUQ pain. She denies excessive NSAID use.  As far as her lupus anticoagulant + status, she was seen by a hematologist and her Ob, Dr. Despina Hidden, during her pregnancy and was told she only needed lovenox during pregnancy.  She has never had issues otherwise with any clotting problem or blood clots per her report.  She was treated due to her continued miscarriages and associate with lupus anticoagulate syndrome. She reports that her twins had twin to twin transfusion and she required an intrauterine ablation during the pregnancy.  She has had to be intubated with her prior D&C and did have some issues with asthma and stridor after her intubation.        Past Medical History:  Diagnosis Date  . Anxiety and depression   . Asthma   . Birth defect   . Cyst of left kidney   . Dysrhythmia    h/o SVT  . GERD (gastroesophageal reflux disease)   . Headache   . Hypoglycemia   . Lupus (HCC)   . Miscarriage   . Neuropathic pain   . Palpitations          Past Surgical History:  Procedure Laterality Date  . CESAREAN SECTION    . DILATION AND CURETTAGE OF UTERUS N/A 01/29/2017   Procedure: SUCTION DILATATION AND CURETTAGE;  Surgeon: Tilda Burrow,  MD;  Location: [facility];  Service: Gynecology;  Laterality: N/A;  . DILATION AND CURETTAGE OF UTERUS N/A 05/01/2017   Procedure: SUCTION DILATATION AND CURETTAGE;  Surgeon: Lazaro Arms, MD;  Location: [facility];  Service: Gynecology;  Laterality: N/A;  pt to arrive at 7:15 to have labwork  . FETOSCOPIC LASER PHOTOCOAGULATION  11/14/2017   of 34 placental vessels for TTTS  . WISDOM TOOTH EXTRACTION           Family History  Adopted: Yes  Problem Relation Age of Onset  . Migraines Sister   . Hypertension Maternal Grandmother   . Skin cancer Maternal Grandmother   . Heart disease Maternal Grandfather     Social History        Tobacco Use  . Smoking status: Never Smoker  . Smokeless tobacco: Never Used  Substance Use Topics  . Alcohol use: No    Alcohol/week: 0.0 oz  . Drug use: No    Medications: I have reviewed the patient's current medications. Prior to Admission:  (Not in a hospital admission) Scheduled: Continuous: PRN:      Allergies as of 07/15/2018      Reactions   Pecan Nut (diagnostic) Hives   Reglan [metoclopramide] Hives  Medication List            Accurate as of 07/15/18 10:36 AM. Always use your most recent med list.           desogestrel-ethinyl estradiol 0.15-0.02/0.01 MG (21/5) tablet Commonly known as:  KARIVA,AZURETTE,MIRCETTE Take 1 tablet by mouth daily.   FLINSTONES GUMMIES OMEGA-3 DHA PO Take 2 tablets by mouth daily. Takes 2 daily   progesterone 200 MG capsule Commonly known as:  PROMETRIUM Take 1 capsule (200 mg total) by mouth daily.   promethazine 12.5 MG suppository Commonly known as:  PHENERGAN Place 1 suppository (12.5 mg total) rectally every 6 (six) hours as needed for nausea or vomiting.   VENTOLIN HFA 108 (90 Base) MCG/ACT inhaler Generic drug:  albuterol Inhale 2 puffs into the lungs every 6 (six) hours as needed for wheezing or shortness of breath.   zolpidem 5 MG  tablet Commonly known as:  AMBIEN Take 1 tablet (5 mg total) by mouth at bedtime as needed for sleep.        ROS:  A comprehensive review of systems was negative except for: Constitutional: positive for headaches Gastrointestinal: positive for abdominal pain and nausea  Blood pressure 113/67, pulse 82, temperature 97.8 F (36.6 C), temperature source Temporal, resp. rate 16, weight 138 lb (62.6 kg), last menstrual period 02/07/2018, not currently breastfeeding. Physical Exam  Constitutional: She is oriented to person, place, and time. She appears well-developed.  HENT:  Head: Normocephalic and atraumatic.  Eyes: Pupils are equal, round, and reactive to light.  Neck: Normal range of motion.  Cardiovascular: Normal rate and regular rhythm.  Pulmonary/Chest: Effort normal and breath sounds normal.  Abdominal: Soft. She exhibits no distension. There is tenderness.  RUQ with deep palpation  Musculoskeletal: Normal range of motion. She exhibits no edema.  Neurological: She is alert and oriented to person, place, and time.  Skin: Skin is warm and dry.  Psychiatric: She has a normal mood and affect. Her behavior is normal. Judgment and thought content normal.  Vitals reviewed.   Results: US RUQ OSH: Stones, normal CBD, no fluid or thickening     Assessment & Plan:  Carol Bernard is a 23 y.o. female with cholelithiasis who continues to have pain. She has a history of lupus anticoagulate syndrome and is currently not on any lovenox.  I am unsure if she took herself off of this or if she came off of it after pregnancy as some of Dr. Forestine ChuteEure's notes indicate she should still be on anticoagulation but it appears she was [redacted] weeks pregnant at that visit on 04/28/18. She had a spontaneous abortion and took cytotec, which she told me nothing about, and was seen by him 06/24/18.    -I need to discuss with Dr. Despina HiddenEure whether this patient needs to be on any lovenox outside of pregnancy given  increased risk for blood clots in the perioperative period  -She is currently scheduled for lap chole 7/24/ 19   PLAN: I counseled the patient about the indication, risks and benefits of laparoscopic cholecystectomy.  She understands there is a very small chance for bleeding, infection, injury to normal structures (including common bile duct), conversion to open surgery, persistent symptoms, evolution of postcholecystectomy diarrhea, need for secondary interventions, anesthesia reaction, cardiopulmonary issues and other risks not specifically detailed here. I described the expected recovery, the plan for follow-up and the restrictions during the recovery phase.  All questions were answered.  All questions were answered to the satisfaction  of the patient.  Plan for lovenox 40 preop and for 5 days following surgery.   Carol Bernard 07/15/2018, 10:36 AM

## 2018-07-21 ENCOUNTER — Other Ambulatory Visit (HOSPITAL_COMMUNITY): Payer: Self-pay

## 2018-07-21 NOTE — Patient Instructions (Signed)
Carol Bernard  07/21/2018     @PREFPERIOPPHARMACY @   Your procedure is scheduled on 07/23/2018  Report to [facility] at 7:00 A.M.  Call this number if you have problems the morning of surgery:  [phone]   Remember:  Do not eat or drink after midnight.      Take these medicines the morning of surgery with A SIP OF WATER Albuterol inhaler, Progesterone, Topamax    Do not wear jewelry, make-up or nail polish.  Do not wear lotions, powders, or perfumes  Do not bring valuables to the hospital.  [facility] is not responsible for any belongings or valuables.  Contacts, dentures or bridgework may not be worn into surgery.  Leave your suitcase in the car.  After surgery it may be brought to your room.  For patients admitted to the hospital, discharge time will be determined by your treatment team.  Patients discharged the day of surgery will not be allowed to drive or make legal decisions for 24 hours post surgery.    Please read over the following fact sheets that you were given. Surgical Site Infection Prevention and Anesthesia Post-op Instructions     PATIENT INSTRUCTIONS POST-ANESTHESIA  IMMEDIATELY FOLLOWING SURGERY:  Do not drive or operate machinery for the first twenty four hours after surgery.  Do not make any important decisions for twenty four hours after surgery or while taking narcotic pain medications or sedatives.  If you develop intractable nausea and vomiting or a severe headache please notify your doctor immediately.  FOLLOW-UP:  Please make an appointment with your surgeon as instructed. You do not need to follow up with anesthesia unless specifically instructed to do so.  WOUND CARE INSTRUCTIONS (if applicable):  Keep a dry clean dressing on the anesthesia/puncture wound site if there is drainage.  Once the wound has quit draining you may leave it open to air.  Generally you should leave the bandage intact for twenty four hours unless there is drainage.  If  the epidural site drains for more than 36-48 hours please call the anesthesia department.  QUESTIONS?:  Please feel free to call your physician or the hospital operator if you have any questions, and they will be happy to assist you.       Laparoscopic Cholecystectomy Laparoscopic cholecystectomy is surgery to remove the gallbladder. The gallbladder is a pear-shaped organ that lies beneath the liver on the right side of the body. The gallbladder stores bile, which is a fluid that helps the body to digest fats. Cholecystectomy is often done for inflammation of the gallbladder (cholecystitis). This condition is usually caused by a buildup of gallstones (cholelithiasis) in the gallbladder. Gallstones can block the flow of bile, which can result in inflammation and pain. In severe cases, emergency surgery may be required. This procedure is done though small incisions in your abdomen (laparoscopic surgery). A thin scope with a camera (laparoscope) is inserted through one incision. Thin surgical instruments are inserted through the other incisions. In some cases, a laparoscopic procedure may be turned into a type of surgery that is done through a larger incision (open surgery). Tell a health care provider about:  Any allergies you have.  All medicines you are taking, including vitamins, herbs, eye drops, creams, and over-the-counter medicines.  Any problems you or family members have had with anesthetic medicines.  Any blood disorders you have.  Any surgeries you have had.  Any medical conditions you have.  Whether you are pregnant or may  be pregnant. What are the risks? Generally, this is a safe procedure. However, problems may occur, including:  Infection.  Bleeding.  Allergic reactions to medicines.  Damage to other structures or organs.  A stone remaining in the common bile duct. The common bile duct carries bile from the gallbladder into the small intestine.  A bile leak from the  cyst duct that is clipped when your gallbladder is removed.  What happens before the procedure? Staying hydrated Follow instructions from your health care provider about hydration, which may include:  Up to 2 hours before the procedure - you may continue to drink clear liquids, such as water, clear fruit juice, black coffee, and plain tea.  Eating and drinking restrictions Follow instructions from your health care provider about eating and drinking, which may include:  8 hours before the procedure - stop eating heavy meals or foods such as meat, fried foods, or fatty foods.  6 hours before the procedure - stop eating light meals or foods, such as toast or cereal.  6 hours before the procedure - stop drinking milk or drinks that contain milk.  2 hours before the procedure - stop drinking clear liquids.  Medicines  Ask your health care provider about: ? Changing or stopping your regular medicines. This is especially important if you are taking diabetes medicines or blood thinners. ? Taking medicines such as aspirin and ibuprofen. These medicines can thin your blood. Do not take these medicines before your procedure if your health care provider instructs you not to.  You may be given antibiotic medicine to help prevent infection. General instructions  Let your health care provider know if you develop a cold or an infection before surgery.  Plan to have someone take you home from the hospital or clinic.  Ask your health care provider how your surgical site will be marked or identified. What happens during the procedure?  To reduce your risk of infection: ? Your health care team will wash or sanitize their hands. ? Your skin will be washed with soap. ? Hair may be removed from the surgical area.  An IV tube may be inserted into one of your veins.  You will be given one or more of the following: ? A medicine to help you relax (sedative). ? A medicine to make you fall asleep  (general anesthetic).  A breathing tube will be placed in your mouth.  Your surgeon will make several small cuts (incisions) in your abdomen.  The laparoscope will be inserted through one of the small incisions. The camera on the laparoscope will send images to a TV screen (monitor) in the operating room. This lets your surgeon see inside your abdomen.  Air-like gas will be pumped into your abdomen. This will expand your abdomen to give the surgeon more room to perform the surgery.  Other tools that are needed for the procedure will be inserted through the other incisions. The gallbladder will be removed through one of the incisions.  Your common bile duct may be examined. If stones are found in the common bile duct, they may be removed.  After your gallbladder has been removed, the incisions will be closed with stitches (sutures), staples, or skin glue.  Your incisions may be covered with a bandage (dressing). The procedure may vary among health care providers and hospitals. What happens after the procedure?  Your blood pressure, heart rate, breathing rate, and blood oxygen level will be monitored until the medicines you were given have worn  off.  You will be given medicines as needed to control your pain.  Do not drive for 24 hours if you were given a sedative. This information is not intended to replace advice given to you by your health care provider. Make sure you discuss any questions you have with your health care provider. Document Released: 12/17/2005 Document Revised: 07/08/2016 Document Reviewed: 06/04/2016 Elsevier Interactive Patient Education  2018 [employer]

## 2018-07-22 ENCOUNTER — Other Ambulatory Visit: Payer: Self-pay

## 2018-07-22 ENCOUNTER — Encounter (HOSPITAL_COMMUNITY)
Admission: RE | Admit: 2018-07-22 | Discharge: 2018-07-22 | Disposition: A | Discharge status: Home / Self Care | Payer: Commercial Managed Care - PPO | Source: Ambulatory Visit | Attending: General Surgery | Admitting: General Surgery

## 2018-07-22 ENCOUNTER — Encounter (HOSPITAL_COMMUNITY): Payer: Self-pay

## 2018-07-22 DIAGNOSIS — Z01812 Encounter for preprocedural laboratory examination: Secondary | ICD-10-CM | POA: Insufficient documentation

## 2018-07-22 DIAGNOSIS — Z01810 Encounter for preprocedural cardiovascular examination: Secondary | ICD-10-CM | POA: Diagnosis present

## 2018-07-22 HISTORY — DX: Other complications of anesthesia, initial encounter: T88.59XA

## 2018-07-22 HISTORY — DX: Family history of other specified conditions: Z84.89

## 2018-07-22 HISTORY — DX: Adverse effect of unspecified anesthetic, initial encounter: T41.45XA

## 2018-07-22 LAB — BASIC METABOLIC PANEL
ANION GAP: 4 — AB (ref 5–15)
BUN: 13 mg/dL (ref 6–20)
CALCIUM: 9.1 mg/dL (ref 8.9–10.3)
CO2: 28 mmol/L (ref 22–32)
CREATININE: 0.63 mg/dL (ref 0.44–1.00)
Chloride: 107 mmol/L (ref 98–111)
Glucose, Bld: 82 mg/dL (ref 70–99)
Potassium: 4 mmol/L (ref 3.5–5.1)
SODIUM: 139 mmol/L (ref 135–145)

## 2018-07-22 LAB — CBC
HEMATOCRIT: 37.7 % (ref 36.0–46.0)
Hemoglobin: 12.8 g/dL (ref 12.0–15.0)
MCH: 29.3 pg (ref 26.0–34.0)
MCHC: 34 g/dL (ref 30.0–36.0)
MCV: 86.3 fL (ref 78.0–100.0)
Platelets: 171 10*3/uL (ref 150–400)
RBC: 4.37 MIL/uL (ref 3.87–5.11)
RDW: 12.4 % (ref 11.5–15.5)
WBC: 6.1 10*3/uL (ref 4.0–10.5)

## 2018-07-23 ENCOUNTER — Ambulatory Visit (HOSPITAL_COMMUNITY): Payer: Commercial Managed Care - PPO | Admitting: Anesthesiology

## 2018-07-23 ENCOUNTER — Encounter (HOSPITAL_COMMUNITY): Payer: Self-pay | Admitting: *Deleted

## 2018-07-23 ENCOUNTER — Telehealth: Payer: Self-pay | Admitting: General Surgery

## 2018-07-23 ENCOUNTER — Encounter (HOSPITAL_COMMUNITY)
Admission: RE | Disposition: A | Discharge status: Home / Self Care | Payer: Self-pay | Source: Ambulatory Visit | Attending: General Surgery

## 2018-07-23 ENCOUNTER — Ambulatory Visit (HOSPITAL_COMMUNITY)
Admission: RE | Admit: 2018-07-23 | Discharge: 2018-07-23 | Disposition: A | Discharge status: Home / Self Care | Payer: Commercial Managed Care - PPO | Source: Ambulatory Visit | Attending: General Surgery | Admitting: General Surgery

## 2018-07-23 DIAGNOSIS — Z888 Allergy status to other drugs, medicaments and biological substances status: Secondary | ICD-10-CM | POA: Diagnosis not present

## 2018-07-23 DIAGNOSIS — K8010 Calculus of gallbladder with chronic cholecystitis without obstruction: Secondary | ICD-10-CM | POA: Insufficient documentation

## 2018-07-23 DIAGNOSIS — F329 Major depressive disorder, single episode, unspecified: Secondary | ICD-10-CM | POA: Diagnosis not present

## 2018-07-23 DIAGNOSIS — K219 Gastro-esophageal reflux disease without esophagitis: Secondary | ICD-10-CM | POA: Diagnosis not present

## 2018-07-23 DIAGNOSIS — M329 Systemic lupus erythematosus, unspecified: Secondary | ICD-10-CM | POA: Diagnosis not present

## 2018-07-23 DIAGNOSIS — F419 Anxiety disorder, unspecified: Secondary | ICD-10-CM | POA: Diagnosis not present

## 2018-07-23 DIAGNOSIS — Z79899 Other long term (current) drug therapy: Secondary | ICD-10-CM | POA: Insufficient documentation

## 2018-07-23 HISTORY — PX: CHOLECYSTECTOMY: SHX55

## 2018-07-23 LAB — PREGNANCY, URINE: Preg Test, Ur: NEGATIVE

## 2018-07-23 SURGERY — LAPAROSCOPIC CHOLECYSTECTOMY
Anesthesia: General | Site: Abdomen

## 2018-07-23 MED ORDER — DEXAMETHASONE SODIUM PHOSPHATE 4 MG/ML IJ SOLN
INTRAMUSCULAR | Status: AC
  Filled 2018-07-23: qty 1

## 2018-07-23 MED ORDER — SCOPOLAMINE 1 MG/3DAYS TD PT72
MEDICATED_PATCH | TRANSDERMAL | Status: AC
  Filled 2018-07-23: qty 1

## 2018-07-23 MED ORDER — MIDAZOLAM HCL 2 MG/2ML IJ SOLN
INTRAMUSCULAR | Status: AC
  Filled 2018-07-23: qty 2

## 2018-07-23 MED ORDER — SCOPOLAMINE 1 MG/3DAYS TD PT72
1.0000 | MEDICATED_PATCH | TRANSDERMAL | Status: DC
  Administered 2018-07-23: 1.5 mg via TRANSDERMAL

## 2018-07-23 MED ORDER — DEXAMETHASONE SODIUM PHOSPHATE 4 MG/ML IJ SOLN
INTRAMUSCULAR | Status: DC | PRN
  Administered 2018-07-23: 4 mg via INTRAVENOUS

## 2018-07-23 MED ORDER — PROMETHAZINE HCL 12.5 MG RE SUPP: 12.5000 mg | Freq: Four times a day (QID) | RECTAL | 6 refills | Status: DC | PRN

## 2018-07-23 MED ORDER — ONDANSETRON HCL 4 MG/2ML IJ SOLN: 4.0000 mg | Freq: Once | INTRAMUSCULAR | Status: DC | PRN

## 2018-07-23 MED ORDER — PROPOFOL 10 MG/ML IV BOLUS
INTRAVENOUS | Status: AC
  Filled 2018-07-23: qty 40

## 2018-07-23 MED ORDER — ENOXAPARIN SODIUM 40 MG/0.4ML ~~LOC~~ SOLN: 40.0000 mg | SUBCUTANEOUS | Status: DC

## 2018-07-23 MED ORDER — BUPIVACAINE LIPOSOME 1.3 % IJ SUSP
INTRAMUSCULAR | Status: AC
  Filled 2018-07-23: qty 20

## 2018-07-23 MED ORDER — ROCURONIUM BROMIDE 100 MG/10ML IV SOLN
INTRAVENOUS | Status: DC | PRN
  Administered 2018-07-23: 20 mg via INTRAVENOUS
  Administered 2018-07-23: 10 mg via INTRAVENOUS

## 2018-07-23 MED ORDER — SUGAMMADEX SODIUM 500 MG/5ML IV SOLN
INTRAVENOUS | Status: AC
  Filled 2018-07-23: qty 5

## 2018-07-23 MED ORDER — CEFAZOLIN SODIUM-DEXTROSE 2-4 GM/100ML-% IV SOLN
INTRAVENOUS | Status: AC
  Filled 2018-07-23: qty 100

## 2018-07-23 MED ORDER — HEMOSTATIC AGENTS (NO CHARGE) OPTIME
TOPICAL | Status: DC | PRN
  Administered 2018-07-23: 1 via TOPICAL

## 2018-07-23 MED ORDER — OXYCODONE HCL 5 MG PO TABS: 5.0000 mg | ORAL_TABLET | ORAL | 0 refills | Status: DC | PRN

## 2018-07-23 MED ORDER — SUGAMMADEX SODIUM 200 MG/2ML IV SOLN
INTRAVENOUS | Status: AC
  Filled 2018-07-23: qty 2

## 2018-07-23 MED ORDER — KETOROLAC TROMETHAMINE 30 MG/ML IJ SOLN
INTRAMUSCULAR | Status: DC | PRN
  Administered 2018-07-23: 30 mg via INTRAVENOUS

## 2018-07-23 MED ORDER — ENOXAPARIN SODIUM 40 MG/0.4ML ~~LOC~~ SOLN
40.0000 mg | Freq: Once | SUBCUTANEOUS | Status: AC
  Administered 2018-07-23: 40 mg via SUBCUTANEOUS

## 2018-07-23 MED ORDER — FENTANYL CITRATE (PF) 250 MCG/5ML IJ SOLN
INTRAMUSCULAR | Status: AC
  Filled 2018-07-23: qty 5

## 2018-07-23 MED ORDER — PROMETHAZINE HCL 25 MG RE SUPP: 25.0000 mg | Freq: Four times a day (QID) | RECTAL | 0 refills | Status: DC | PRN

## 2018-07-23 MED ORDER — PHENYLEPHRINE 40 MCG/ML (10ML) SYRINGE FOR IV PUSH (FOR BLOOD PRESSURE SUPPORT)
PREFILLED_SYRINGE | INTRAVENOUS | Status: AC
  Filled 2018-07-23: qty 10

## 2018-07-23 MED ORDER — CEFAZOLIN SODIUM-DEXTROSE 2-4 GM/100ML-% IV SOLN: 2.0000 g | INTRAVENOUS | Status: DC

## 2018-07-23 MED ORDER — ROCURONIUM BROMIDE 50 MG/5ML IV SOLN
INTRAVENOUS | Status: AC
  Filled 2018-07-23: qty 1

## 2018-07-23 MED ORDER — KETOROLAC TROMETHAMINE 30 MG/ML IJ SOLN: 30.0000 mg | Freq: Once | INTRAMUSCULAR | Status: DC | PRN

## 2018-07-23 MED ORDER — CHLORHEXIDINE GLUCONATE CLOTH 2 % EX PADS: 6.0000 | MEDICATED_PAD | Freq: Once | CUTANEOUS | Status: DC

## 2018-07-23 MED ORDER — SCOPOLAMINE 1 MG/3DAYS TD PT72: 1.0000 | MEDICATED_PATCH | TRANSDERMAL | Status: DC

## 2018-07-23 MED ORDER — IPRATROPIUM-ALBUTEROL 0.5-2.5 (3) MG/3ML IN SOLN: 3.0000 mL | Freq: Four times a day (QID) | RESPIRATORY_TRACT | Status: DC

## 2018-07-23 MED ORDER — FENTANYL CITRATE (PF) 100 MCG/2ML IJ SOLN
INTRAMUSCULAR | Status: AC
  Filled 2018-07-23: qty 2

## 2018-07-23 MED ORDER — IPRATROPIUM-ALBUTEROL 0.5-2.5 (3) MG/3ML IN SOLN
RESPIRATORY_TRACT | Status: AC
  Filled 2018-07-23: qty 3

## 2018-07-23 MED ORDER — BUPIVACAINE HCL (PF) 0.5 % IJ SOLN
INTRAMUSCULAR | Status: DC | PRN
  Administered 2018-07-23: 10 mL

## 2018-07-23 MED ORDER — HYDROCODONE-ACETAMINOPHEN 5-325 MG PO TABS: 1.0000 | ORAL_TABLET | ORAL | 0 refills | Status: DC | PRN

## 2018-07-23 MED ORDER — ALBUTEROL SULFATE HFA 108 (90 BASE) MCG/ACT IN AERS: 2.0000 | INHALATION_SPRAY | Freq: Four times a day (QID) | RESPIRATORY_TRACT | 0 refills | Status: DC | PRN

## 2018-07-23 MED ORDER — CEFAZOLIN SODIUM-DEXTROSE 2-3 GM-%(50ML) IV SOLR
INTRAVENOUS | Status: DC | PRN
  Administered 2018-07-23: 2 g via INTRAVENOUS

## 2018-07-23 MED ORDER — IPRATROPIUM-ALBUTEROL 0.5-2.5 (3) MG/3ML IN SOLN
3.0000 mL | Freq: Four times a day (QID) | RESPIRATORY_TRACT | Status: AC
  Administered 2018-07-23: 3 mL via RESPIRATORY_TRACT

## 2018-07-23 MED ORDER — KETOROLAC TROMETHAMINE 30 MG/ML IJ SOLN
INTRAMUSCULAR | Status: AC
  Filled 2018-07-23: qty 1

## 2018-07-23 MED ORDER — HYDROCODONE-ACETAMINOPHEN 7.5-325 MG PO TABS
1.0000 | ORAL_TABLET | Freq: Once | ORAL | Status: AC | PRN
  Administered 2018-07-23: 1 via ORAL
  Filled 2018-07-23: qty 1

## 2018-07-23 MED ORDER — PROPOFOL 10 MG/ML IV BOLUS
INTRAVENOUS | Status: DC | PRN
  Administered 2018-07-23: 150 mg via INTRAVENOUS

## 2018-07-23 MED ORDER — MEPERIDINE HCL 50 MG/ML IJ SOLN: 6.2500 mg | INTRAMUSCULAR | Status: DC | PRN

## 2018-07-23 MED ORDER — HYDROCODONE-ACETAMINOPHEN 5-325 MG PO TABS: 1.0000 | ORAL_TABLET | Freq: Four times a day (QID) | ORAL | 0 refills | Status: DC | PRN

## 2018-07-23 MED ORDER — SUCCINYLCHOLINE CHLORIDE 20 MG/ML IJ SOLN
INTRAMUSCULAR | Status: DC | PRN
  Administered 2018-07-23: 100 mg via INTRAVENOUS

## 2018-07-23 MED ORDER — SUCCINYLCHOLINE CHLORIDE 20 MG/ML IJ SOLN
INTRAMUSCULAR | Status: AC
  Filled 2018-07-23: qty 3

## 2018-07-23 MED ORDER — HYDROMORPHONE HCL 1 MG/ML IJ SOLN
0.2500 mg | INTRAMUSCULAR | Status: DC | PRN
  Administered 2018-07-23: 0.5 mg via INTRAVENOUS
  Filled 2018-07-23: qty 0.5

## 2018-07-23 MED ORDER — LACTATED RINGERS IV SOLN
INTRAVENOUS | Status: DC
  Administered 2018-07-23: 09:00:00 via INTRAVENOUS

## 2018-07-23 MED ORDER — ONDANSETRON HCL 4 MG/2ML IJ SOLN
INTRAMUSCULAR | Status: DC | PRN
  Administered 2018-07-23: 4 mg via INTRAVENOUS

## 2018-07-23 MED ORDER — LORAZEPAM 1 MG PO TABS: 0.5000 mg | ORAL_TABLET | Freq: Three times a day (TID) | ORAL | 0 refills | Status: DC | PRN

## 2018-07-23 MED ORDER — DOCUSATE SODIUM 100 MG PO CAPS: 100.0000 mg | ORAL_CAPSULE | Freq: Two times a day (BID) | ORAL | 2 refills | Status: DC | PRN

## 2018-07-23 MED ORDER — LIDOCAINE HCL (PF) 1 % IJ SOLN
INTRAMUSCULAR | Status: AC
  Filled 2018-07-23: qty 5

## 2018-07-23 MED ORDER — ONDANSETRON HCL 4 MG/2ML IJ SOLN
INTRAMUSCULAR | Status: AC
  Filled 2018-07-23: qty 2

## 2018-07-23 MED ORDER — SUGAMMADEX SODIUM 200 MG/2ML IV SOLN
INTRAVENOUS | Status: DC | PRN
  Administered 2018-07-23: 124.2 mg via INTRAVENOUS

## 2018-07-23 MED ORDER — FENTANYL CITRATE (PF) 100 MCG/2ML IJ SOLN
INTRAMUSCULAR | Status: DC | PRN
  Administered 2018-07-23 (×9): 50 ug via INTRAVENOUS

## 2018-07-23 MED ORDER — 0.9 % SODIUM CHLORIDE (POUR BTL) OPTIME
TOPICAL | Status: DC | PRN
  Administered 2018-07-23: 1000 mL

## 2018-07-23 MED ORDER — ENOXAPARIN SODIUM 40 MG/0.4ML ~~LOC~~ SOLN
SUBCUTANEOUS | Status: AC
  Filled 2018-07-23: qty 0.4

## 2018-07-23 MED ORDER — BUPIVACAINE HCL (PF) 0.5 % IJ SOLN
INTRAMUSCULAR | Status: AC
  Filled 2018-07-23: qty 30

## 2018-07-23 MED ORDER — MIDAZOLAM HCL 5 MG/5ML IJ SOLN
INTRAMUSCULAR | Status: DC | PRN
  Administered 2018-07-23 (×2): 2 mg via INTRAVENOUS

## 2018-07-23 SURGICAL SUPPLY — 41 items
APPLIER CLIP ROT 10 11.4 M/L (STAPLE) ×2
BAG RETRIEVAL 10 (BASKET) ×1
BLADE SURG 15 STRL LF DISP TIS (BLADE) ×1 IMPLANT
BLADE SURG 15 STRL SS (BLADE) ×1
CHLORAPREP W/TINT 26ML (MISCELLANEOUS) ×2 IMPLANT
CLIP APPLIE ROT 10 11.4 M/L (STAPLE) ×1 IMPLANT
CLOTH BEACON ORANGE TIMEOUT ST (SAFETY) ×2 IMPLANT
COVER LIGHT HANDLE STERIS (MISCELLANEOUS) ×4 IMPLANT
DECANTER SPIKE VIAL GLASS SM (MISCELLANEOUS) ×2 IMPLANT
DERMABOND ADVANCED (GAUZE/BANDAGES/DRESSINGS) ×1
DERMABOND ADVANCED .7 DNX12 (GAUZE/BANDAGES/DRESSINGS) ×1 IMPLANT
ELECT REM PT RETURN 9FT ADLT (ELECTROSURGICAL) ×2
ELECTRODE REM PT RTRN 9FT ADLT (ELECTROSURGICAL) ×1 IMPLANT
FILTER SMOKE EVAC LAPAROSHD (FILTER) ×2 IMPLANT
GLOVE BIO SURGEON STRL SZ 6.5 (GLOVE) ×2 IMPLANT
GLOVE BIO SURGEON STRL SZ7 (GLOVE) ×2 IMPLANT
GLOVE BIOGEL PI IND STRL 6.5 (GLOVE) ×2 IMPLANT
GLOVE BIOGEL PI IND STRL 7.0 (GLOVE) ×3 IMPLANT
GLOVE BIOGEL PI INDICATOR 6.5 (GLOVE) ×2
GLOVE BIOGEL PI INDICATOR 7.0 (GLOVE) ×3
GOWN STRL REUS W/TWL LRG LVL3 (GOWN DISPOSABLE) ×6 IMPLANT
HEMOSTAT SNOW SURGICEL 2X4 (HEMOSTASIS) ×2 IMPLANT
INST SET LAPROSCOPIC AP (KITS) ×2 IMPLANT
KIT TURNOVER KIT A (KITS) ×2 IMPLANT
MANIFOLD NEPTUNE II (INSTRUMENTS) ×2 IMPLANT
NEEDLE INSUFFLATION 14GA 120MM (NEEDLE) ×2 IMPLANT
NS IRRIG 1000ML POUR BTL (IV SOLUTION) ×2 IMPLANT
PACK LAP CHOLE LZT030E (CUSTOM PROCEDURE TRAY) ×2 IMPLANT
PAD ARMBOARD 7.5X6 YLW CONV (MISCELLANEOUS) ×2 IMPLANT
SET BASIN LINEN APH (SET/KITS/TRAYS/PACK) ×2 IMPLANT
SLEEVE ENDOPATH XCEL 5M (ENDOMECHANICALS) ×2 IMPLANT
SUT MNCRL AB 4-0 PS2 18 (SUTURE) ×2 IMPLANT
SUT VICRYL 0 UR6 27IN ABS (SUTURE) ×2 IMPLANT
SYS BAG RETRIEVAL 10MM (BASKET) ×1
SYSTEM BAG RETRIEVAL 10MM (BASKET) ×1 IMPLANT
TROCAR ENDO BLADELESS 11MM (ENDOMECHANICALS) ×2 IMPLANT
TROCAR XCEL NON-BLD 5MMX100MML (ENDOMECHANICALS) ×2 IMPLANT
TROCAR XCEL UNIV SLVE 11M 100M (ENDOMECHANICALS) ×2 IMPLANT
TUBE CONNECTING 12X1/4 (SUCTIONS) ×2 IMPLANT
TUBING INSUFFLATION (TUBING) ×2 IMPLANT
WARMER LAPAROSCOPE (MISCELLANEOUS) ×2 IMPLANT

## 2018-07-23 NOTE — Anesthesia Procedure Notes (Signed)
Procedure Name: Intubation Date/Time: 07/23/2018 8:49 AM Performed by: Andree Elk, Amy A, CRNA Pre-anesthesia Checklist: Timeout performed, Patient identified, Emergency Drugs available, Suction available and Patient being monitored Patient Re-evaluated:Patient Re-evaluated prior to induction Oxygen Delivery Method: Circle System Utilized Preoxygenation: Pre-oxygenation with 100% oxygen Induction Type: IV induction Ventilation: Mask ventilation without difficulty Laryngoscope Size: Mac and 3 Grade View: Grade I Tube type: Oral Tube size: 7.0 mm Number of attempts: 1 Airway Equipment and Method: Stylet Placement Confirmation: ETT inserted through vocal cords under direct vision,  positive ETCO2 and breath sounds checked- equal and bilateral Secured at: 21 cm Tube secured with: Tape Dental Injury: Teeth and Oropharynx as per pre-operative assessment

## 2018-07-23 NOTE — Telephone Encounter (Signed)
[facility]  Patient with nausea/vomiting. Phenergan suppositories at home. Needs more. Roxicodone making her nauseated.   Will change to Norco and give refill suppositories.   If remains nauseated will need to go to the ED.  Algis GreenhouseLindsay Jeniel Slauson, MD [facility] [address] [place], [place] [id number] [phone] (office)

## 2018-07-23 NOTE — Anesthesia Postprocedure Evaluation (Signed)
Anesthesia Post Note  Patient: Carol MeyerHaley M Wood  Procedure(s) Performed: LAPAROSCOPIC CHOLECYSTECTOMY (N/A Abdomen)  Patient location during evaluation: PACU Anesthesia Type: General Level of consciousness: awake and alert and oriented Pain management: pain level controlled Vital Signs Assessment: post-procedure vital signs reviewed and stable Respiratory status: spontaneous breathing Cardiovascular status: stable Postop Assessment: no apparent nausea or vomiting Anesthetic complications: no     Last Vitals:  Vitals:   07/23/18 0739 07/23/18 1002  BP: 107/71 (!) 146/108  Pulse: 80 (!) 111  Resp: 18 (!) 22  Temp: 36.7 C 36.9 C  SpO2: 97% 100%    Last Pain:  Vitals:   07/23/18 1002  TempSrc:   PainSc: 3                  Latreece Mochizuki A

## 2018-07-23 NOTE — Op Note (Signed)
Operative Note   Preoperative Diagnosis: Symptomatic cholelithiasis   Postoperative Diagnosis: Same   Procedure(s) Performed: Laparoscopic cholecystectomy   Surgeon: Lillia AbedLindsay C. Henreitta LeberBridges, MD   Assistants: No qualified resident was available   Anesthesia: General endotracheal   Anesthesiologist: Shona NeedlesWynn, Vander M, MD    Specimens: Gallbladder    Estimated Blood Loss: Minimal    Blood Replacement: None    Complications: None    Operative Findings:  Distended gallbladder    Procedure: The patient was taken to the operating room and placed supine. General endotracheal anesthesia was induced. Intravenous antibiotics were administered per protocol. An orogastric tube positioned to decompress the stomach. The abdomen was prepared and draped in the usual sterile fashion.    An infraumbilical  incision was made due to a belly ring hole and a Veress technique was utilized to achieve pneumoperitoneum to 15 mmHg with carbon dioxide. A 11 mm optiview port was placed through the infraumbilical region, and a 10 mm 0-degree operative laparoscope was introduced. The area underlying the trocar and Veress needle were inspected and without evidence of injury.  Remaining trocars were placed under direct vision. Two 5 mm ports were placed in the right abdomen, between the anterior axillary and midclavicular line.  A final 11 mm port was placed through the mid-epigastrium, near the falciform ligament.    The gallbladder fundus was elevated cephalad and the infundibulum was retracted to the patient's right. The gallbladder/cystic duct junction was skeletonized. The cystic artery noted in the triangle of Calot and was also skeletonized.  We then continued liberal medial and lateral dissection until the critical view of safety was achieved.    The cystic duct was doubly clipped and cystic artery was triply clipped as one of the clips did not appear to be closed all the way. The duct and artery were divided. There  was a posterior branch off the cystic artery that was small but was clipped once to ensure hemostasis.  The gallbladder was then dissected from the liver bed with electrocautery. The specimen was placed in an Endopouch and was retrieved through the epigastric site.   Final inspection revealed acceptable hemostasis. Surgical Jamelle HaringSnow was placed in the gallbladder bed. 0 Vicryl fascial sutures were used to close the umbilical port site and the epigastric site was less than the size of my finger and did not have to be stretch to get the gallbladder out. Trocars were removed and pneumoperitoneum was released. Skin incisions were closed with 4-0 Monocryl subcuticular sutures and Dermabond. The patient was awakened from anesthesia and extubated without complication.    Algis GreenhouseLindsay Bridges, MD [facility] [address] [place], [place] [address] [phone] (office)

## 2018-07-23 NOTE — Anesthesia Preprocedure Evaluation (Signed)
Anesthesia Evaluation  Patient identified by MRN, date of birth, ID band Patient awake    Reviewed: Allergy & Precautions, H&P , NPO status , Patient's Chart, lab work & pertinent test results  History of Anesthesia Complications (+) Family history of anesthesia reaction and history of anesthetic complications  Airway Mallampati: I  TM Distance: >3 FB Neck ROM: full    Dental no notable dental hx.    Pulmonary neg pulmonary ROS, asthma ,    Pulmonary exam normal breath sounds clear to auscultation       Cardiovascular Exercise Tolerance: Good negative cardio ROS Normal cardiovascular exam+ dysrhythmias  Rhythm:regular Rate:Normal     Neuro/Psych  Headaches, Anxiety negative neurological ROS  negative psych ROS   GI/Hepatic negative GI ROS, Neg liver ROS, GERD  ,  Endo/Other  negative endocrine ROS  Renal/GU Renal diseasenegative Renal ROS  negative genitourinary   Musculoskeletal   Abdominal   Peds  Hematology negative hematology ROS (+)   Anesthesia Other Findings   Reproductive/Obstetrics negative OB ROS                             Anesthesia Physical Anesthesia Plan  ASA: II  Anesthesia Plan: General   Post-op Pain Management:    Induction:   PONV Risk Score and Plan:   Airway Management Planned:   Additional Equipment:   Intra-op Plan:   Post-operative Plan:   Informed Consent: I have reviewed the patients History and Physical, chart, labs and discussed the procedure including the risks, benefits and alternatives for the proposed anesthesia with the patient or authorized representative who has indicated his/her understanding and acceptance.   Dental Advisory Given  Plan Discussed with: CRNA  Anesthesia Plan Comments:         Anesthesia Quick Evaluation

## 2018-07-23 NOTE — Progress Notes (Signed)
[facility]  Patient with lovenox 40mg  shots at home that are in date per her report. Needs to take for the next 4 days post op 7/25-7/28.   Algis GreenhouseLindsay Joseeduardo Brix, MD [facility] [address] [place], [place] [phone] (office)

## 2018-07-23 NOTE — Transfer of Care (Signed)
Immediate Anesthesia Transfer of Care Note  Patient: Carol Bernard  Procedure(s) Performed: LAPAROSCOPIC CHOLECYSTECTOMY (N/A Abdomen)  Patient Location: PACU  Anesthesia Type:General  Level of Consciousness: awake, alert , oriented and patient cooperative  Airway & Oxygen Therapy: Patient Spontanous Breathing and Patient connected to face mask oxygen  Post-op Assessment: Report given to RN and Post -op Vital signs reviewed and stable  Post vital signs: Reviewed and stable  Last Vitals:  Vitals Value Taken Time  BP 146/108 07/23/2018 10:02 AM  Temp    Pulse 144 07/23/2018 10:04 AM  Resp 21 07/23/2018 10:04 AM  SpO2 84 % 07/23/2018 10:04 AM  Vitals shown include unvalidated device data.  Last Pain:  Vitals:   07/23/18 0739  TempSrc: Oral  PainSc: 3       Patients Stated Pain Goal: 6 (07/23/18 0739)  Complications: No apparent anesthesia complications

## 2018-07-23 NOTE — Discharge Instructions (Signed)
Discharge Instructions: Shower per your regular routine. Take tylenol and ibuprofen as needed for pain control, alternating every 4-6 hours.  Take Roxicodone for breakthrough pain. Take colace for constipation related to narcotic pain medication. Do not pick at the dermabond glue on your incision sites.  Take your lovenox 40 mg injections daily on 7/25 - 7/28 to prevent blood clots post operatively. (Patient has shots at home that are in date per her report).  Ambulate and move around as much as possible post operatively.    Laparoscopic Cholecystectomy, Care After This sheet gives you information about how to care for yourself after your procedure. Your doctor may also give you more specific instructions. If you have problems or questions, contact your doctor. Follow these instructions at home: Care for cuts from surgery (incisions)   Follow instructions from your doctor about how to take care of your cuts from surgery. Make sure you: ? Wash your hands with soap and water before you change your bandage (dressing). If you cannot use soap and water, use hand sanitizer. ? Change your bandage as told by your doctor. ? Leave stitches (sutures), skin glue, or skin tape (adhesive) strips in place. They may need to stay in place for 2 weeks or longer. If tape strips get loose and curl up, you may trim the loose edges. Do not remove tape strips completely unless your doctor says it is okay.  Do not take baths, swim, or use a hot tub until your doctor says it is okay.   Check your surgical cut area every day for signs of infection. Check for: ? More redness, swelling, or pain. ? More fluid or blood. ? Warmth. ? Pus or a bad smell. Activity  Do not drive or use heavy machinery while taking prescription pain medicine.  Do not lift anything that is heavier than 10 lb (4.5 kg) until your doctor says it is okay.  Do not play contact sports until your doctor says it is okay.  Do not drive for 24  hours if you were given a medicine to help you relax (sedative).  Rest as needed. Do not return to work or school until your doctor says it is okay. General instructions  Take over-the-counter and prescription medicines only as told by your doctor.  To prevent or treat constipation while you are taking prescription pain medicine, your doctor may recommend that you: ? Drink enough fluid to keep your pee (urine) clear or pale yellow. ? Take over-the-counter or prescription medicines. ? Eat foods that are high in fiber, such as fresh fruits and vegetables, whole grains, and beans. ? Limit foods that are high in fat and processed sugars, such as fried and sweet foods. Contact a doctor if:  You develop a rash.  You have more redness, swelling, or pain around your surgical cuts.  You have more fluid or blood coming from your surgical cuts.  Your surgical cuts feel warm to the touch.  You have pus or a bad smell coming from your surgical cuts.  You have a fever.  One or more of your surgical cuts breaks open. Get help right away if:  You have trouble breathing.  You have chest pain.  You have pain that is getting worse in your shoulders.  You faint or feel dizzy when you stand.  You have very bad pain in your belly (abdomen).  You are sick to your stomach (nauseous) for more than one day.  You have throwing up (vomiting) that  lasts for more than one day.  You have leg pain. This information is not intended to replace advice given to you by your health care provider. Make sure you discuss any questions you have with your health care provider. Document Released: 09/25/2008 Document Revised: 07/07/2016 Document Reviewed: 06/04/2016 Elsevier Interactive Patient Education  2018 Elsevier Inc.  Venous Thromboembolism Prevention Venous thromboembolism (VTE) is a condition in which a blood clot (thrombus) develops in the body. A thrombus usually occurs in a deep vein in the leg or  the pelvis (DVT), but it can also occur in the arm. Sometimes, pieces of a thrombus can break off from its original place of development and travel through the bloodstream to other parts of the body. When that happens, the thrombus is called an embolus. An embolus that travels to one or both lungs is called a pulmonary embolism. An embolism can block the blood flow in the blood vessels of other organs as well. VTE is a serious health condition that can cause disability or death. It is very important to get help right away and to not ignore symptoms. How can a VTE be prevented?  Exercise regularly. Take a brisk 30 minute walk every day. Staying active and moving around can help you to prevent blood clots.  Avoid sitting or lying in bed for long periods of time without moving your legs. Change your position often, especially during long-distance travel (over 4 hours).  If you are a woman who is over 80 years of age, avoid unnecessary use of medicines that contain estrogen. These include birth control pills and hormone replacement therapy.  Do not smoke, especially if you take estrogen medicines. If you need help quitting, ask your health care provider.  Eat plenty of fruits and vegetables. Ask your health care provider or dietitian if there are foods that you should avoid.  Maintain a weight that is appropriate for your height. Ask your health care provider what weight is healthy for you.  Wear loose-fitting clothing. Avoid constrictive or tight clothing around your legs or waist.  Try not to bump or injure your legs. Avoid crossing your legs when you are sitting.  Do not use pillows under your knees while lying down unless told by your health care provider.  Wear support hose (compression stockings or TED hose) as told by your health care provider Compression stockings increase blood flow in your legs and can help prevent blood clots. Do not let them bunch up when you are wearing them. How can  I prevent VTE when I travel? Long-distance travel (over 4 hours) can increase the risk of a VTE. To prevent VTE when traveling:  Exercise your legs every hour by standing, stretching, and bending and straightening your legs. If you are traveling by airplane, train, or bus, walk up and down the aisle as often as possible to get your blood moving. If you are traveling by car, stop and get out of the car every hour to exercise your legs and stretch. Other types of exercise might include: ? Keeping your feet flat on the ground and raising your toes. ? Switching from tightening the muscles in your calves and thighs to relaxing those same muscles while you are sitting. ? Pointing and flexing your feet at the ankle joints while you are sitting.  Stay well hydrated while traveling. Drink enough water to keep your urine clear or pale yellow.  Avoid drinking alcohol during long travel.  Generally, it is not recommended that you  take medicines to prevent DVT during routine travel. How can VTE be prevented if I am hospitalized? A VTE may be prevented by taking medicines that are prescribed to prevent blood clots (anticoagulants). You can also help to prevent VTE while in the hospital by taking these actions:  Get out of bed and walk. Ask your health care provider if this is safe for you to do.  Request the use of a sequential compression device (SCD). This is a machine that pumps air into compression sleeves that are wrapped around your legs.  Request the use of compression stockings, which are tight, elastic stockings that apply pressure to the lower legs. Compression stockings are sometimes used with SCDs.  How can I prevent VTE after surgery? Understand that there is an increased risk for VTE for the first 4-6 weeks after surgery. During this time:  Avoid long-distance travel (over 4 hours). If you must travel during this time, ask your health care provider about additional preventive actions that  you can take. These might include exercising your arms and legs every hour while you travel.  Avoid sitting or lying still for too long. If possible, get up and walk around one time every hour. Ask your health care provider when this is safe for you to do.  Get help right away if:  You have new or increased pain, swelling, or redness in an arm or leg.  You have numbness or tingling in an arm or leg.  You have shortness of breath while active or at rest.  You have chest pain.  You have a rapid or irregular heartbeat.  You feel light-headed or dizzy.  You cough up blood.  You notice blood in your vomit, bowel movement, or urine. These symptoms may represent a serious problem that is an emergency. Do not wait to see if the symptoms will go away. Get medical help right away. Call your local emergency services (911 in the [place] Do not drive yourself to the hospital. This information is not intended to replace advice given to you by your health care provider. Make sure you discuss any questions you have with your health care provider. Document Released: 12/05/2009 Document Revised: 05/24/2016 Document Reviewed: 04/13/2015 Elsevier Interactive Patient Education  2018 [employer].  Enoxaparin injection What is this medicine? ENOXAPARIN (ee nox a PA rin) is used after knee, hip, or abdominal surgeries to prevent blood clotting. It is also used to treat existing blood clots in the lungs or in the veins. This medicine may be used for other purposes; ask your health care provider or pharmacist if you have questions. COMMON BRAND NAME(S): Lovenox What should I tell my health care provider before I take this medicine? They need to know if you have any of these conditions: -bleeding disorders, hemorrhage, or hemophilia -infection of the heart or heart valves -kidney or liver disease -previous stroke -prosthetic heart valve -recent surgery or delivery of a baby -ulcer in the stomach or  intestine, diverticulitis, or other bowel disease -an unusual or allergic reaction to enoxaparin, heparin, pork or pork products, other medicines, foods, dyes, or preservatives -pregnant or trying to get pregnant -breast-feeding How should I use this medicine? This medicine is for injection under the skin. It is usually given by a health-care professional. You or a family member may be trained on how to give the injections. If you are to give yourself injections, make sure you understand how to use the syringe, measure the dose if necessary, and give  the injection. To avoid bruising, do not rub the site where this medicine has been injected. Do not take your medicine more often than directed. Do not stop taking except on the advice of your doctor or health care professional. Make sure you receive a puncture-resistant container to dispose of the needles and syringes once you have finished with them. Do not reuse these items. Return the container to your doctor or health care professional for proper disposal. Talk to your pediatrician regarding the use of this medicine in children. Special care may be needed. Overdosage: If you think you have taken too much of this medicine contact a poison control center or emergency room at once. NOTE: This medicine is only for you. Do not share this medicine with others. What if I miss a dose? If you miss a dose, take it as soon as you can. If it is almost time for your next dose, take only that dose. Do not take double or extra doses. What may interact with this medicine? -aspirin and aspirin-like medicines -certain medicines that treat or prevent blood clots -dipyridamole -NSAIDs, medicines for pain and inflammation, like ibuprofen or naproxen This list may not describe all possible interactions. Give your health care provider a list of all the medicines, herbs, non-prescription drugs, or dietary supplements you use. Also tell them if you smoke, drink alcohol, or  use illegal drugs. Some items may interact with your medicine. What should I watch for while using this medicine? Visit your doctor or health care professional for regular checks on your progress. Your condition will be monitored carefully while you are receiving this medicine. Notify your doctor or health care professional and seek emergency treatment if you develop breathing problems; changes in vision; chest pain; severe, sudden headache; pain, swelling, warmth in the leg; trouble speaking; sudden numbness or weakness of the face, arm, or leg. These can be signs that your condition has gotten worse. If you are going to have surgery, tell your doctor or health care professional that you are taking this medicine. Do not stop taking this medicine without first talking to your doctor. Be sure to refill your prescription before you run out of medicine. Avoid sports and activities that might cause injury while you are using this medicine. Severe falls or injuries can cause unseen bleeding. Be careful when using sharp tools or knives. Consider using an Neurosurgeon. Take special care brushing or flossing your teeth. Report any injuries, bruising, or red spots on the skin to your doctor or health care professional. What side effects may I notice from receiving this medicine? Side effects that you should report to your doctor or health care professional as soon as possible: -allergic reactions like skin rash, itching or hives, swelling of the face, lips, or tongue -feeling faint or lightheaded, falls -signs and symptoms of bleeding such as bloody or black, tarry stools; red or dark-brown urine; spitting up blood or brown material that looks like coffee grounds; red spots on the skin; unusual bruising or bleeding from the eye, gums, or nose Side effects that usually do not require medical attention (report to your doctor or health care professional if they continue or are bothersome): -pain, redness, or  irritation at site where injected This list may not describe all possible side effects. Call your doctor for medical advice about side effects. You may report side effects to FDA at 1-800-FDA-1088. Where should I keep my medicine? Keep out of the reach of children. Store at room temperature  between 15 and 30 degrees C (59 and 86 degrees F). Do not freeze. If your injections have been specially prepared, you may need to store them in the refrigerator. Ask your pharmacist. Throw away any unused medicine after the expiration date. NOTE: This sheet is a summary. It may not cover all possible information. If you have questions about this medicine, talk to your doctor, pharmacist, or health care provider.  2018 Elsevier/Gold Standard (2014-04-20 16:06:21)

## 2018-07-23 NOTE — Interval H&P Note (Signed)
History and Physical Interval Note:  07/23/2018 8:13 AM  Carol Bernard  has presented today for surgery, with the diagnosis of cholelithiasis  The various methods of treatment have been discussed with the patient and family. After consideration of risks, benefits and other options for treatment, the patient has consented to  Procedure(s): LAPAROSCOPIC CHOLECYSTECTOMY (N/A) as a surgical intervention .  The patient's history has been reviewed, patient examined, no change in status, stable for surgery.  I have reviewed the patient's chart and labs.  Questions were answered to the patient's satisfaction.    No additional questions. Earring out. lovenox given 40mg . Breathing treatment from anesthesia preop for stridor.   [name]

## 2018-07-24 ENCOUNTER — Telehealth: Payer: Self-pay | Admitting: General Surgery

## 2018-07-24 ENCOUNTER — Encounter (HOSPITAL_COMMUNITY): Payer: Self-pay | Admitting: General Surgery

## 2018-07-24 NOTE — Telephone Encounter (Signed)
Patient doing better. Wants to return to work prior to our appt. 8/3 is return date. Works [facility] [employer] [facility] and helps at desk, some patient care.  Should be good to return to work then. Will write note and she can pick up tomorrow at [facility].   Algis GreenhouseLindsay Floris Neuhaus, MD [facility] [address] [place], [place] [id number] [phone] (office)

## 2018-07-24 NOTE — Telephone Encounter (Signed)
[facility] [place], [place]    To whom it may concern,  Ms. Carol PaganHaley Bernard underwent surgery on 07/23/2018.  She can return to work on 08/02/2018 without restrictions.  Please excuse her for the date of the surgery and the dates following the surgery.   Sincerely,      Algis GreenhouseLindsay Bridges, MD [facility] [address] [place], [place] [phone] (office)

## 2018-07-31 ENCOUNTER — Telehealth: Payer: Self-pay | Admitting: General Surgery

## 2018-07-31 ENCOUNTER — Other Ambulatory Visit: Payer: Self-pay | Admitting: Emergency Medicine

## 2018-07-31 MED ORDER — PROMETHAZINE HCL 25 MG RE SUPP: 25.0000 mg | Freq: Four times a day (QID) | RECTAL | 0 refills | Status: DC | PRN

## 2018-07-31 NOTE — Telephone Encounter (Signed)
[facility]  Patient with nausea. Temp to 100 but nothing higher. Some minor pain but nothing that is constant. Says she is just constantly nauseated. Incisions not red or draining.   Refill for phenergan written.  If worsening nausea, temp > 101.5, jaundice, or worsening pain needs to go to the ED.  Can see us 8/6 in the office if needs to be seen sooner to 8/8 appt.  Algis GreenhouseLindsay Bridges, MD [facility] [address] [place], [place] [phone] (office)

## 2018-08-05 ENCOUNTER — Ambulatory Visit: Payer: Self-pay | Admitting: General Surgery

## 2018-08-07 ENCOUNTER — Ambulatory Visit: Payer: Self-pay | Admitting: General Surgery

## 2018-08-07 ENCOUNTER — Ambulatory Visit (INDEPENDENT_AMBULATORY_CARE_PROVIDER_SITE_OTHER): Payer: Self-pay | Admitting: General Surgery

## 2018-08-07 ENCOUNTER — Encounter: Payer: Self-pay | Admitting: General Surgery

## 2018-08-07 VITALS — BP 124/90 | HR 97 | Temp 97.7°F | Resp 20 | Wt 136.0 lb

## 2018-08-07 DIAGNOSIS — K8020 Calculus of gallbladder without cholecystitis without obstruction: Secondary | ICD-10-CM

## 2018-08-07 NOTE — Patient Instructions (Signed)
Activity and diet as tolerated. Follow up with Dr. Despina HiddenEure with continued issues with menstrual process.

## 2018-08-07 NOTE — Progress Notes (Signed)
[facility] Note   HPI:  23 y.o. Female presents to clinic for post-op follow-up evaluation after a laparoscopic cholecystectomy. Patient reports doing well but having intermittent pain. Improved nausea.   Review of Systems:  No fevers or chills Tolerating diet Some spotting with periods  All other review of systems: otherwise negative   Pathology  Diagnosis Gallbladder - CHRONIC CHOLECYSTITIS AND CHOLELITHIASIS.  Vital Signs:  BP 124/90 (BP Location: Left Arm, Patient Position: Sitting, Cuff Size: Normal)   Pulse 97   Temp 97.7 F (36.5 C) (Temporal)   Resp 20   Wt 136 lb (61.7 kg)   LMP 07/18/2018   BMI 23.34 kg/m    Physical Exam:  Physical Exam  Constitutional: She appears well-developed.  HENT:  Head: Normocephalic.  Cardiovascular: Normal rate.  Pulmonary/Chest: Effort normal.  Abdominal: Soft. She exhibits no distension. There is no tenderness.  Port sites c/d/i with peeling dermabond, no erythema or drainage  Vitals reviewed.   Laboratory studies: None   Imaging: None   Assessment:  23 y.o. yo Female s/p lap chole for chronic cholecystitis. Doing well.  Plan:  Activity and diet as tolerated. Follow up with Dr. Despina HiddenEure with contin ued issues with menstrual process.   All of the above recommendations were discussed with the patient, and all of patient's questions were answered to her expressed satisfaction.  Algis GreenhouseLindsay Maribel Luis, MD [facility] [address] [place], [place] [phone] (office)

## 2018-09-02 ENCOUNTER — Telehealth: Payer: Self-pay | Admitting: Obstetrics & Gynecology

## 2018-09-02 ENCOUNTER — Encounter: Payer: Self-pay | Admitting: *Deleted

## 2018-09-02 MED ORDER — MEDROXYPROGESTERONE ACETATE 150 MG/ML IM SUSP: 150.0000 mg | INTRAMUSCULAR | 3 refills | Status: DC

## 2018-09-02 NOTE — Telephone Encounter (Signed)
Patient came into the office today and asked if Dr. Despina Hidden could call her in a depo to the [facility]. Pt states that if she doesn't answer please leave message. PLease contact pt when done

## 2018-09-02 NOTE — Telephone Encounter (Signed)
Meds ordered this encounter  Medications  . medroxyPROGESTERone (DEPO-PROVERA) 150 MG/ML injection    Sig: Inject 1 mL (150 mg total) into the muscle every 3 (three) months.    Dispense:  1 mL    Refill:  3    

## 2018-09-08 ENCOUNTER — Telehealth: Payer: Self-pay | Admitting: Obstetrics & Gynecology

## 2018-09-08 MED ORDER — ENOXAPARIN SODIUM 40 MG/0.4ML ~~LOC~~ SOLN: 40.0000 mg | SUBCUTANEOUS | Status: DC

## 2018-09-08 NOTE — Telephone Encounter (Signed)
LMOVM that refill for Lovenox had been sent in

## 2018-09-12 ENCOUNTER — Other Ambulatory Visit: Payer: Self-pay | Admitting: General Surgery

## 2018-09-12 ENCOUNTER — Other Ambulatory Visit: Payer: Self-pay | Admitting: Obstetrics & Gynecology

## 2018-09-25 ENCOUNTER — Encounter: Payer: Self-pay | Admitting: Physician Assistant

## 2018-09-25 NOTE — Progress Notes (Signed)
Cardiology Office Note    Date:  09/26/2018  ID:  Verneal, Wiers May 18, 1995, MRN [id number] PCP:  Lawerance Sabal, PA  Cardiologist:  Dina Rich, MD  Chief Complaint: CP, palpitations  History of Present Illness:  Carol Bernard is a 23 y.o. female with history of palpitations, anxiety, depression, multiple miscarriages, lupus anticoagulant positive (required Lovenox during pregnancy with twins delivered @ 26w gestation) who presents for re-evaluation of chest pain.  She was previously seen by Dr.Branch for this in 2017 for atypical chest pain and palpitations.. 06/2016 event monitor without significant arrhythmias. She was started on Lopressor but has not needed this recently. Someone has also added h/o SVT to her chart but this diagnosis is unclear to me.  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 She had 5 successive miscarriages and her OB undertook hypercoag testing and lupus anticoagulant testing was positive. She was placed on Lovenox for her next pregnancy and she delivered twins at 26w. 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 She had premature ROM and required C-section. She had a post-partum infection due to amniotic fluid having broken for so long. 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 She now states they are doing fairly well except one is battling a URI. Earlier this year she had another pregnancy loss with residual pregnancy tissue treated with Cytotec. In 06/2018 she underwent lap chole for cholelithiasis. She reports she is now still on Lovenox. She states she was told by hematology that she would not require it outside of pregnancy and that Dr. Despina Hidden has said the same, but she does not feel comfortable completely discontinuing so he has continued to fill for her. She says she feels generally better on it in a way  she cannot really describe. She states she was not happy with the hematology consult she received, stating "nothing got done." I do see that note is quite thorough and indicates "Recommend repeat testing for antiphospholipid antibodies as definitive diagnosis requires to test to be performed at least 12 weeks apart", and "Since the patient has never experienced a thromboembolic event discontinuation of anticoagulation is appropriate." She does report a family history of clotting. I do not see these labs were ever repeated. She also has a family history of heart disease with a cousin who died in her 55s of CHF, and an aunt with CABG in her 5s. She also had to resuscitate her grandfather who died of an MI later in life.  She returns for follow-up for re-evaluation of intermittent chest discomfort since 2017. It comes with no specific pattern, can happen once a week, once a day, or go weeks between episodes. The typical description is a very brief jolting/squeezing sensation lasting a few seconds to minutes. It is not worse with exertion, inspiration, meals or palpation. She is fairly active in everyday life and has not experienced any exertional angina or dyspnea. 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 She has not had issues with palpitations recently. She denies any syncope. She does not know her lipid status. She does not smoke. Last labs 06/2018 showed Cr 0.63, K 4.0, CBC wnl, 05/2017 TSH wnl. She feels well today.   Past Medical History:  Diagnosis Date  . Anxiety and depression   . Asthma   . Birth defect   . Complication of anesthesia    '  I had stridor after my 1st D&C but with my 2nd one they gave me a breathing treatment before my surgery and I did ok".  . Cyst of left kidney   . Dysrhythmia    h/o SVT  . Family history of adverse reaction to anesthesia   . GERD (gastroesophageal reflux disease)   . Headache   . History of recurrent miscarriages   . Hypoglycemia   .  Lupus anticoagulant positive   . Neuropathic pain   . Palpitations     Past Surgical History:  Procedure Laterality Date  . CESAREAN SECTION    . CHOLECYSTECTOMY N/A 07/23/2018   Procedure: LAPAROSCOPIC CHOLECYSTECTOMY;  Surgeon: Lucretia Roers, MD;  Location: [facility];  Service: General;  Laterality: N/A;  . DILATION AND CURETTAGE OF UTERUS N/A 01/29/2017   Procedure: SUCTION DILATATION AND CURETTAGE;  Surgeon: Tilda Burrow, MD;  Location: [facility];  Service: Gynecology;  Laterality: N/A;  . DILATION AND CURETTAGE OF UTERUS N/A 05/01/2017   Procedure: SUCTION DILATATION AND CURETTAGE;  Surgeon: Lazaro Arms, MD;  Location: [facility];  Service: Gynecology;  Laterality: N/A;  pt to arrive at 7:15 to have labwork  . FETOSCOPIC LASER PHOTOCOAGULATION  11/14/2017   of 34 placental vessels for TTTS  . WISDOM TOOTH EXTRACTION    r  Current Medications: Current Meds  Medication Sig  . albuterol (VENTOLIN HFA) 108 (90 Base) MCG/ACT inhaler Inhale 2 puffs into the lungs every 6 (six) hours as needed for wheezing or shortness of breath.  . desogestrel-ethinyl estradiol (KARIVA,AZURETTE,MIRCETTE) 0.15-0.02/0.01 MG (21/5) tablet Take 1 tablet by mouth daily.  Marland Kitchen docusate sodium (COLACE) 100 MG capsule Take 1 capsule (100 mg total) by mouth 2 (two) times daily as needed for mild constipation.  . enoxaparin (LOVENOX) 40 MG/0.4ML injection Inject 0.4 mLs (40 mg total) into the skin daily.  Marland Kitchen LORazepam (ATIVAN) 1 MG tablet Take 0.5-1 tablets (0.5-1 mg total) by mouth every 8 (eight) hours as needed for anxiety.  . medroxyPROGESTERone (DEPO-PROVERA) 150 MG/ML injection Inject 1 mL (150 mg total) into the muscle every 3 (three) months.  . Pediatric Multiple Vit-C-FA (FLINSTONES GUMMIES OMEGA-3 DHA PO) Take 1 tablet by mouth daily.   . progesterone (PROMETRIUM) 200 MG capsule Take 1 capsule (200 mg total) by mouth daily.  . promethazine (PHENERGAN) 25 MG suppository Place 1 suppository (25 mg total) rectally  every 6 (six) hours as needed for nausea or vomiting.  . topiramate (TOPAMAX) 100 MG tablet Take 100 mg by mouth at bedtime.  Marland Kitchen zolpidem (AMBIEN) 5 MG tablet TAKE 1 TABLET (5 MG TOTAL) BY MOUTH AT BEDTIME AS NEEDED FOR SLEEP.      Allergies:   Pecan nut (diagnostic) and Reglan [metoclopramide]   Social History   Socioeconomic History  . Marital status: Single    Spouse name: Not on file  . Number of children: Not on file  . Years of education: Not on file  . Highest education level: Not on file  Occupational History  . Not on file  Social Needs  . Financial resource strain: Not on file  . Food insecurity:    Worry: Not on file    Inability: Not on file  . Transportation needs:    Medical: Not on file    Non-medical: Not on file  Tobacco Use  . Smoking status: Never Smoker  . Smokeless tobacco: Never Used  Substance and Sexual Activity  . Alcohol use: No    Alcohol/week: 0.0  standard drinks  . Drug use: No  . Sexual activity: Not Currently    Birth control/protection: None  Lifestyle  . Physical activity:    Days per week: Not on file    Minutes per session: Not on file  . Stress: Not on file  Relationships  . Social connections:    Talks on phone: Not on file    Gets together: Not on file    Attends religious service: Not on file    Active member of club or organization: Not on file    Attends meetings of clubs or organizations: Not on file    Relationship status: Not on file  Other Topics Concern  . Not on file  Social History Narrative  . Not on file     Family History:  The patient's family history includes Heart disease in her maternal grandfather; Hypertension in her maternal grandmother; Migraines in her sister; Skin cancer in her maternal grandmother. She was adopted.  ROS:   Please see the history of present illness.  All other systems are reviewed and otherwise negative.    PHYSICAL EXAM:   VS:  BP 110/62   Pulse 89   Wt 136 lb (61.7 kg)    SpO2 99%   BMI 23.34 kg/m   BMI: Body mass index is 23.34 kg/m. GEN: Well nourished, well developed WF, in no acute distress HEENT: normocephalic, atraumatic Neck: no JVD, carotid bruits, or masses Cardiac: RRR; no murmurs, rubs, or gallops, no edema  Respiratory:  clear to auscultation bilaterally, normal work of breathing GI: soft, nontender, nondistended, + BS MS: no deformity or atrophy Skin: warm and dry, no rash Neuro:  Alert and Oriented x 3, Strength and sensation are intact, follows commands Psych: euthymic mood, full affect  Wt Readings from Last 3 Encounters:  09/26/18 136 lb (61.7 kg)  08/07/18 136 lb (61.7 kg)  07/23/18 137 lb (62.1 kg)      Studies/Labs Reviewed:   EKG:  EKG was ordered today and personally reviewed by me and demonstrates NSR 83bpm, nonspecific TW changes with TWI I, II, avF, V3-V6. These territories have had nonspecific changes in the past as well, more defined on today's tracing.  Recent Labs: 12/12/2017: ALT 7 07/22/2018: BUN 13; Creatinine, Ser 0.63; Hemoglobin 12.8; Platelets 171; Potassium 4.0; Sodium 139   Lipid Panel No results found for: CHOL, TRIG, HDL, CHOLHDL, VLDL, LDLCALC, LDLDIRECT  Additional studies/ records that were reviewed today include: Summarized above.   ASSESSMENT & PLAN:   1. Chest pain with baseline abnormal EKG - I reviewed EKG briefly with Dr. Diona Browner in clinic who agrees diffuse TW changes are nonspecific. She had nonspecific changes in these territories in 06/2016 and 12/2016, but improved by 07/22/18 EKG. Her chest pain is very atypical and not suggestive of an obstructive coronary process. She is not tachycardic, tachypneic or hypoxic and chronicity/sporadic nature of symptoms does not really fit with VTE. Will undertake echocardiogram to help guide further decision making. Check lipids and basic labs to evaluate for electrolyte abnormality or hyperlipidemia. I will also route to Dr. Wyline Mood so he can review EKG and  provide any further thoughts. 2. Palpitations - quiescent. Follow. 3. Lupus anticoagulant positive - I encouraged her to seek a second opinion from hematology if she was unhappy with the first one, as it sounds like the story is open ended at this point. Hematology had encouraged repeat testing at least 12 weeks from the original test to formally establish the diagnosis  of antiphospholipid antibody syndrome She continues on Lovenox at her own choice in collaboration with OB at this time. I asked her if her OB had any specific concerns about her being on hormone oral contraceptives and she did not believe that was the case. I don't see any commentary on this in the chart. She is on OCPs but will be switching to Depo soon. I will defer this ongoing decision to OB who seems to have the most consistent ongoing follow-up with the patient. 4. Screening for lipid disorders - as above.  Disposition: F/u with Dr. Wyline Mood in 6 months, sooner depending if workup is abnormal.  Medication Adjustments/Labs and Tests Ordered: Current medicines are reviewed at length with the patient today.  Concerns regarding medicines are outlined above. Medication changes, Labs and Tests ordered today are summarized above and listed in the Patient Instructions accessible in Encounters.   Signed, Laurann Montana, PA-C  09/26/2018 2:56 PM    [facility] [address] [place], [place] [address] Ph: [phone]; Fax [phone]

## 2018-09-26 ENCOUNTER — Other Ambulatory Visit (HOSPITAL_COMMUNITY)
Admission: RE | Admit: 2018-09-26 | Discharge: 2018-09-26 | Disposition: A | Discharge status: Home / Self Care | Payer: Commercial Managed Care - PPO | Source: Ambulatory Visit | Attending: Physician Assistant | Admitting: Physician Assistant

## 2018-09-26 ENCOUNTER — Ambulatory Visit (INDEPENDENT_AMBULATORY_CARE_PROVIDER_SITE_OTHER): Payer: Commercial Managed Care - PPO | Admitting: Physician Assistant

## 2018-09-26 ENCOUNTER — Encounter: Payer: Self-pay | Admitting: Physician Assistant

## 2018-09-26 VITALS — BP 110/62 | HR 89 | Wt 136.0 lb

## 2018-09-26 DIAGNOSIS — R9431 Abnormal electrocardiogram [ECG] [EKG]: Secondary | ICD-10-CM | POA: Diagnosis not present

## 2018-09-26 DIAGNOSIS — R002 Palpitations: Secondary | ICD-10-CM | POA: Diagnosis not present

## 2018-09-26 DIAGNOSIS — R079 Chest pain, unspecified: Secondary | ICD-10-CM | POA: Insufficient documentation

## 2018-09-26 DIAGNOSIS — Z13220 Encounter for screening for lipoid disorders: Secondary | ICD-10-CM

## 2018-09-26 DIAGNOSIS — Z862 Personal history of diseases of the blood and blood-forming organs and certain disorders involving the immune mechanism: Secondary | ICD-10-CM

## 2018-09-26 LAB — CBC WITH DIFFERENTIAL/PLATELET
BASOS ABS: 0 10*3/uL (ref 0.0–0.1)
BASOS PCT: 0 %
Eosinophils Absolute: 0.1 10*3/uL (ref 0.0–0.7)
Eosinophils Relative: 2 %
HEMATOCRIT: 38.9 % (ref 36.0–46.0)
HEMOGLOBIN: 13.1 g/dL (ref 12.0–15.0)
Lymphocytes Relative: 25 %
Lymphs Abs: 1.7 10*3/uL (ref 0.7–4.0)
MCH: 28.8 pg (ref 26.0–34.0)
MCHC: 33.7 g/dL (ref 30.0–36.0)
MCV: 85.5 fL (ref 78.0–100.0)
MONO ABS: 0.3 10*3/uL (ref 0.1–1.0)
Monocytes Relative: 4 %
NEUTROS ABS: 4.5 10*3/uL (ref 1.7–7.7)
NEUTROS PCT: 69 %
Platelets: 180 10*3/uL (ref 150–400)
RBC: 4.55 MIL/uL (ref 3.87–5.11)
RDW: 12.7 % (ref 11.5–15.5)
WBC: 6.6 10*3/uL (ref 4.0–10.5)

## 2018-09-26 LAB — COMPREHENSIVE METABOLIC PANEL
ALT: 17 U/L (ref 0–44)
AST: 18 U/L (ref 15–41)
Albumin: 4.5 g/dL (ref 3.5–5.0)
Alkaline Phosphatase: 54 U/L (ref 38–126)
Anion gap: 4 — ABNORMAL LOW (ref 5–15)
BUN: 14 mg/dL (ref 6–20)
CO2: 30 mmol/L (ref 22–32)
Calcium: 9.7 mg/dL (ref 8.9–10.3)
Chloride: 107 mmol/L (ref 98–111)
Creatinine, Ser: 0.61 mg/dL (ref 0.44–1.00)
GFR calc Af Amer: >60 mL/min (ref >60)
GFR calc non Af Amer: >60 mL/min (ref >60)
Glucose, Bld: 98 mg/dL (ref 70–99)
Potassium: 3.9 mmol/L (ref 3.5–5.1)
Sodium: 141 mmol/L (ref 135–145)
Total Bilirubin: 1.2 mg/dL (ref 0.3–1.2)
Total Protein: 7.5 g/dL (ref 6.5–8.1)

## 2018-09-26 LAB — LIPID PANEL
Cholesterol: 129 mg/dL (ref 0–200)
HDL: 48 mg/dL (ref >40)
LDL Cholesterol: 57 mg/dL (ref 0–99)
Total CHOL/HDL Ratio: 2.7 RATIO
Triglycerides: 122 mg/dL (ref <150)
VLDL: 24 mg/dL (ref 0–40)

## 2018-09-26 LAB — TSH: TSH: 1.32 u[IU]/mL (ref 0.350–4.500)

## 2018-09-26 IMAGING — US US MFM OB LIMITED
1 series · 15 of 28 positions shown · non-contrast
Comparison: none

[Series 1: us mfm ob limited · 56 acquisitions, 15 frames shown]
[im 1/56]
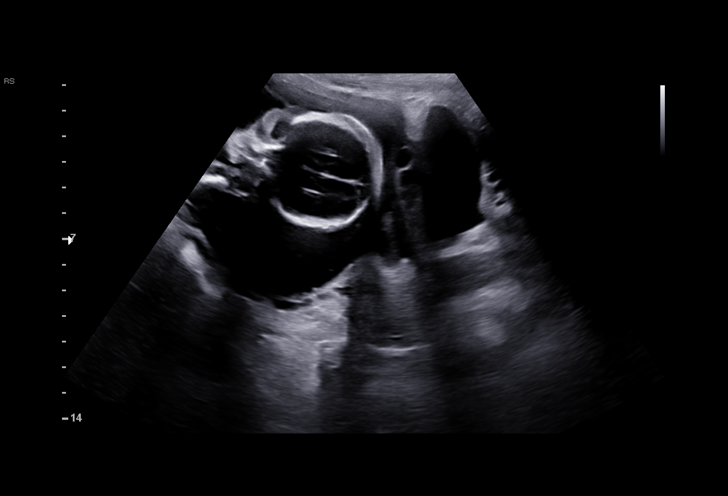
[im 5/56]
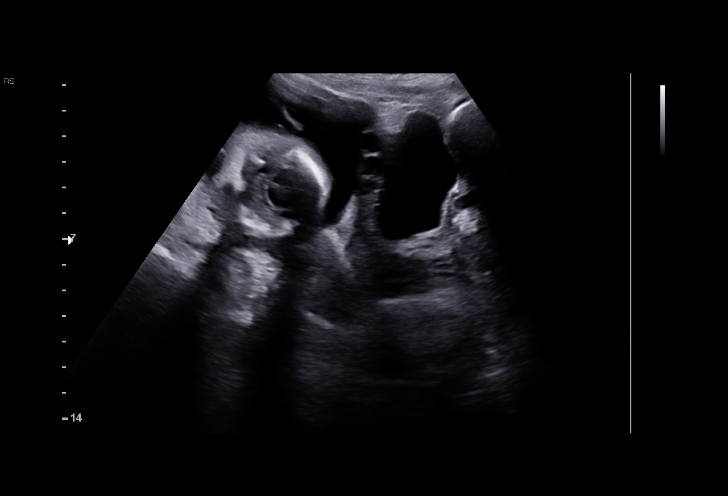
[im 9/56]
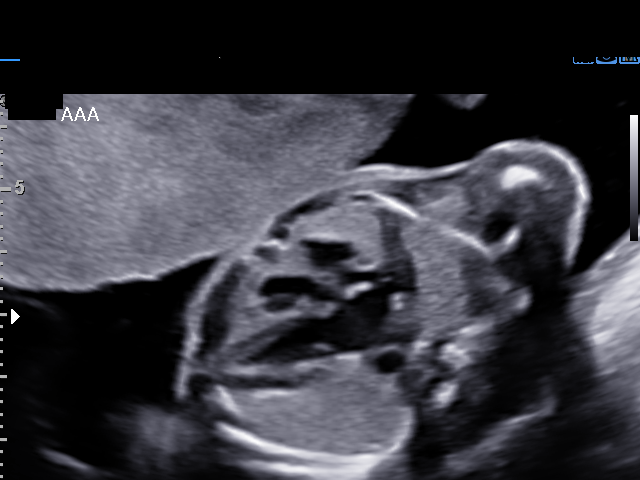
[im 13/56]
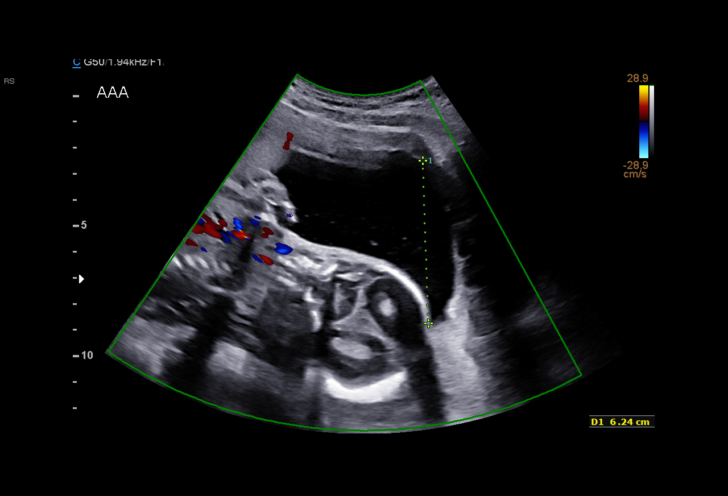
[im 17/56]
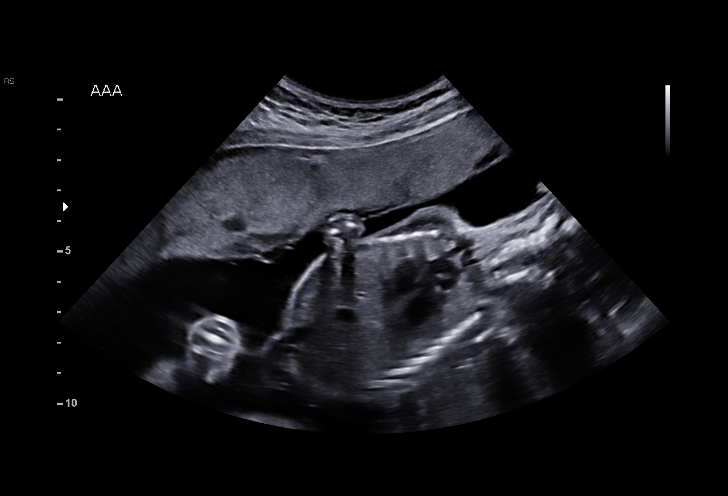
[im 21/56]
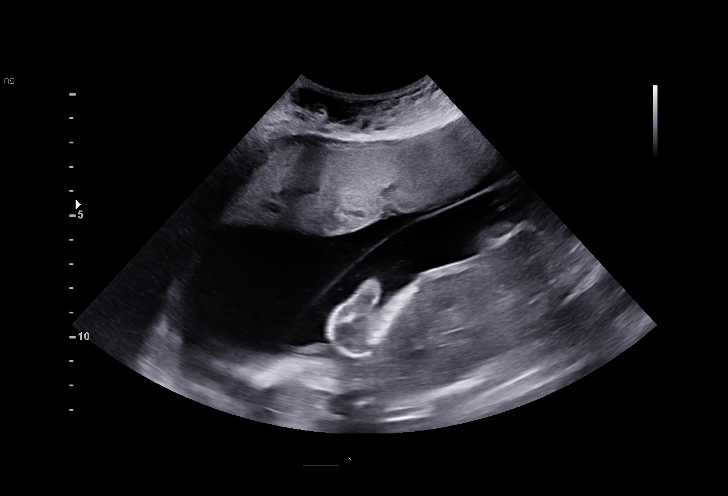
[im 25/56]
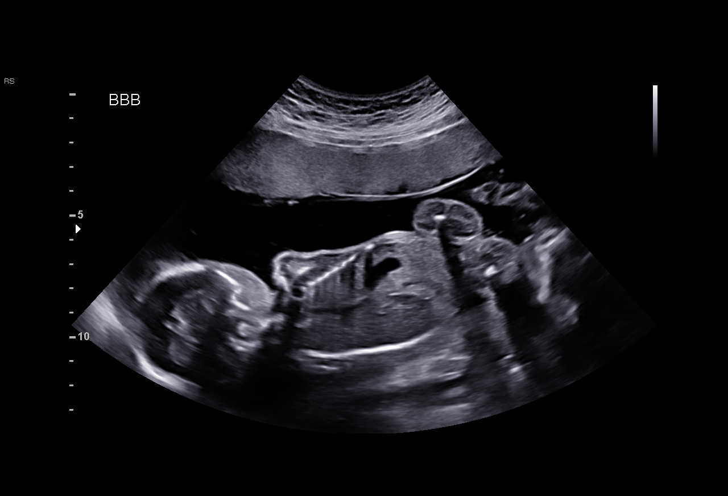
[im 29/56]
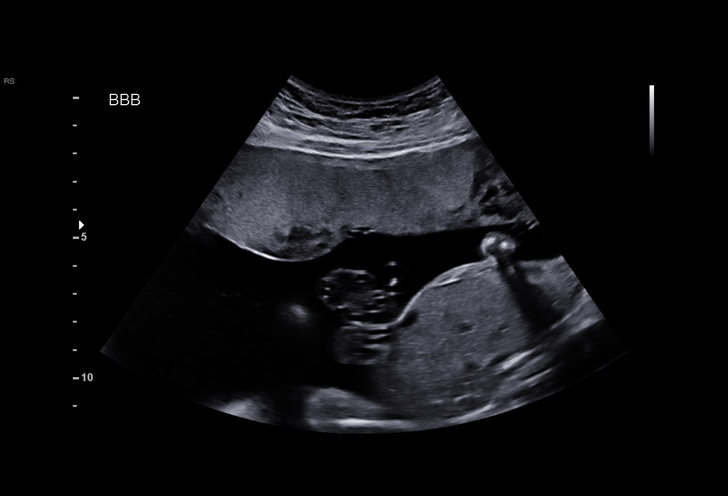
[im 31/56]
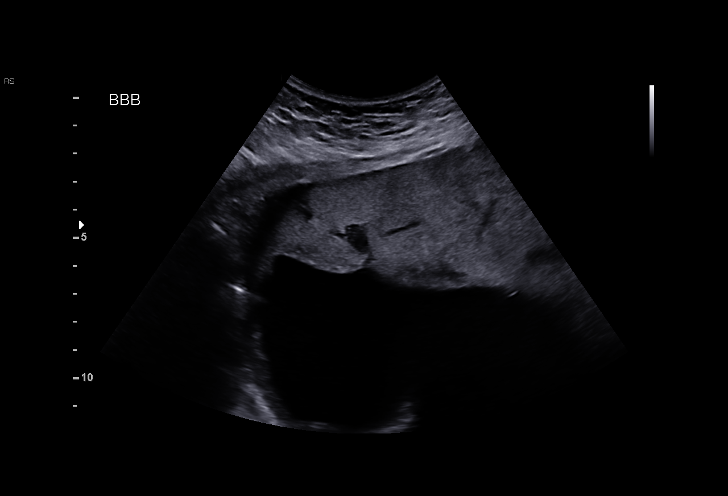
[im 35/56]
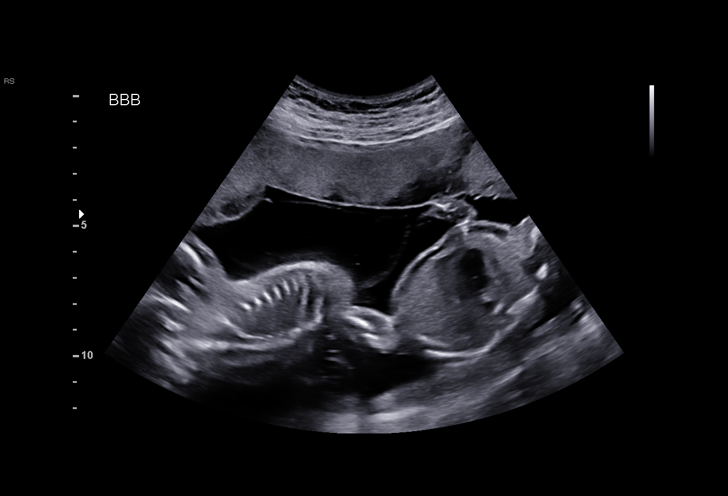
[im 39/56]
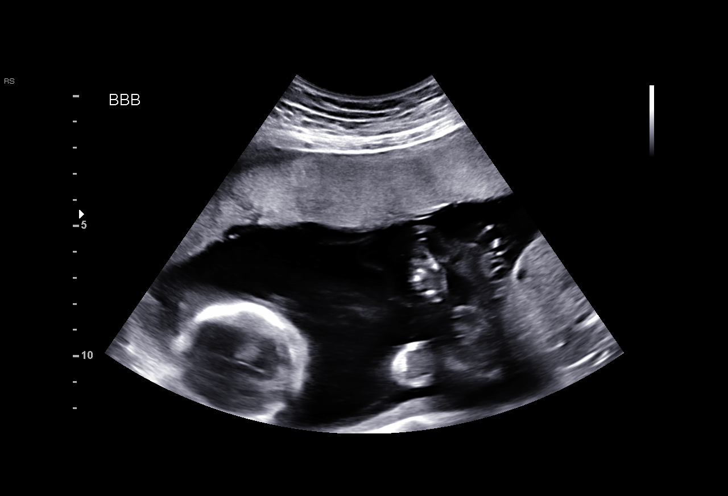
[im 43/56]
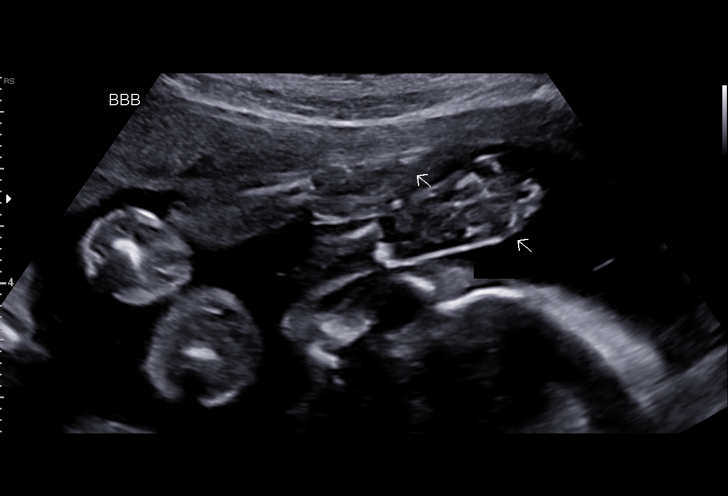
[im 47/56]
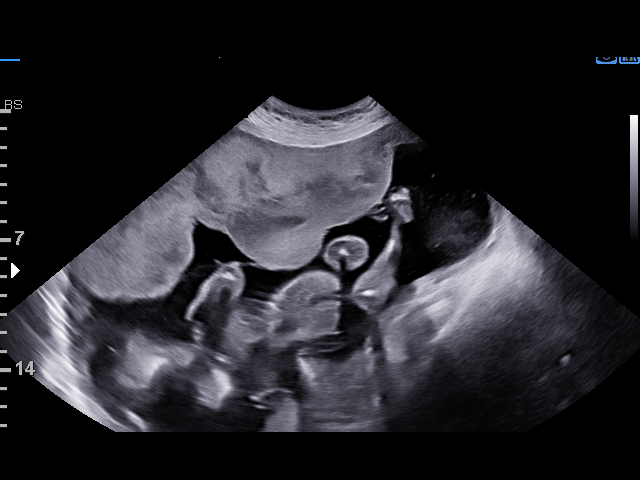
[im 51/56]
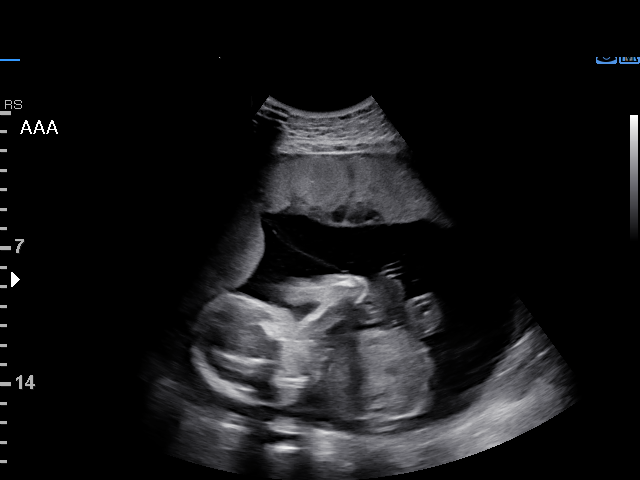
[im 56/56]
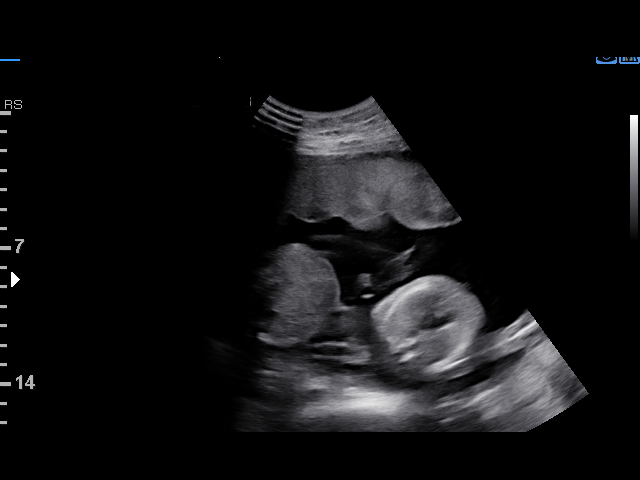

[15 of 28 positions shown; findings below may reference images not displayed]

1  SIQI CHOO              [id number]      [id number]     [id number]
Indications

22 weeks gestation of pregnancy
Twin pregnancy, Shawn Xbrenda/Moolman, second trimester
Twin gestation, Twin-Twin transfusion; S/P
laser photocoagulation on [DATE]
Poor obstetric history-Recurrent (habitual)
abortion (3 consecutive ab's)( 1 Molar
pregnancy); S Ace Lovenox
Systemic lupus complicating pregnancy,         O26.892,
second trimester
OB History

Blood Type:            Height:  5'3"   Weight (lb):  134       BMI:
Gravidity:    6         Term:   0        Prem:   0        SAB:   4
TOP:          0       Ectopic:  0        Living: 0
Fetal Evaluation (Fetus A)

Num Of Fetuses:     2
Fetal Heart         138
Rate(bpm):
Cardiac Activity:   Observed
Fetal Lie:          Left Fetus
Presentation:       Cephalic
Placenta:           Anterior, above cervical os
P. Cord Insertion:  Previously Visualized
Membrane Desc:      Dividing Membrane seen

Amniotic Fluid
AFI FV:      Subjectively within normal limits

Largest Pocket(cm)
7.07
Gestational Age (Fetus A)

LMP:           22w 4d        Date:  06/23/17                 EDD:   03/30/18
Best:          22w 4d     Det. By:  LMP  (06/23/17)          EDD:   03/30/18

Fetal Evaluation (Fetus B)

Num Of Fetuses:     2
Fetal Heart         140
Rate(bpm):
Cardiac Activity:   Observed
Fetal Lie:          Right Fetus
Presentation:       Breech
Placenta:           Anterior, above cervical os
P. Cord Insertion:  Previously Visualized
Membrane Desc:      Dividing Membrane seen

Amniotic Fluid
AFI FV:      Subjectively within normal limits

Largest Pocket(cm)
6.71
Gestational Age (Fetus B)

LMP:           22w 4d        Date:  06/23/17                 EDD:   03/30/18
Best:          22w 4d     Det. By:  LMP  (06/23/17)          EDD:   03/30/18
Cervix Uterus Adnexa

Cervix
Length:           3.12  cm.
Normal appearance by transabdominal scan.

Uterus
No abnormality visualized.

Left Ovary
Not visualized.

Right Ovary
Not visualized.

Adnexa:       No abnormality visualized. No adnexal mass
visualized.
Impression

Monochorionic/diamniotic twin pregnancy at 22+4 weeks
TTTS; S/P laser photocoagulation
Normal amniotic fluid volume x 2
Both fetuses very active
Recommendations

Return next [REDACTED] for fetal growth and TTTS/s/p laser
surveillance

## 2018-09-26 NOTE — Patient Instructions (Signed)
Medication Instructions:  Your physician recommends that you continue on your current medications as directed. Please refer to the Current Medication list given to you today.   Labwork: Your physician recommends that you return for lab work today.    Testing/Procedures: Your physician has requested that you have an echocardiogram. Echocardiography is a painless test that uses sound waves to create images of your heart. It provides your doctor with information about the size and shape of your heart and how well your heart's chambers and valves are working. This procedure takes approximately one hour. There are no restrictions for this procedure.    Follow-Up: Your physician wants you to follow-up in: 6 Months. You will receive a reminder letter in the mail two months in advance. If you don't receive a letter, please call our office to schedule the follow-up appointment.   Any Other Special Instructions Will Be Listed Below (If Applicable).     If you need a refill on your cardiac medications before your next appointment, please call your pharmacy.  Thank you for choosing [facility]!

## 2018-09-29 ENCOUNTER — Telehealth: Payer: Self-pay | Admitting: *Deleted

## 2018-09-29 NOTE — Telephone Encounter (Signed)
-----   Message from Laurann Montana, [place] sent at 09/26/2018  5:24 PM EDT ----- Please let patient know labs normal. Cholesterol panel is the best I've seen today. Continue plan as discussed.  Dayna Dunn PA-C

## 2018-09-29 NOTE — Telephone Encounter (Signed)
Called pt. No answer. Unable to leave msg d/t mailbox being full.

## 2018-10-06 ENCOUNTER — Encounter: Payer: Self-pay | Admitting: Physician Assistant

## 2018-10-06 NOTE — Progress Notes (Signed)
See below for input from Dr. Wyline Mood about EKG. Await echo.  Antoine Poche, MD  Laurann Montana, PA-C        Agree, nonspecific findings. We will see what the echo shows, would not pursue anything beyond that at this time   J BrancH MD   Previous Messages    ----- Message -----  From: Estrella Deeds  Sent: 09/26/2018  4:26 PM EDT  To: Antoine Poche, MD   Please see note and EKG. Let me know if you have any other ideas regarding abnormal EKG - I would not have thought twice about atypical CP but EKG was abnormal. Interesting young lady.

## 2018-10-08 ENCOUNTER — Ambulatory Visit (HOSPITAL_COMMUNITY)
Admission: RE | Admit: 2018-10-08 | Discharge: 2018-10-08 | Disposition: A | Discharge status: Home / Self Care | Payer: Commercial Managed Care - PPO | Source: Ambulatory Visit | Attending: Physician Assistant | Admitting: Physician Assistant

## 2018-10-08 DIAGNOSIS — M329 Systemic lupus erythematosus, unspecified: Secondary | ICD-10-CM | POA: Insufficient documentation

## 2018-10-08 DIAGNOSIS — R9431 Abnormal electrocardiogram [ECG] [EKG]: Secondary | ICD-10-CM | POA: Diagnosis not present

## 2018-10-08 DIAGNOSIS — K219 Gastro-esophageal reflux disease without esophagitis: Secondary | ICD-10-CM | POA: Insufficient documentation

## 2018-10-08 NOTE — Progress Notes (Signed)
*  PRELIMINARY RESULTS* Echocardiogram 2D Echocardiogram has been performed.  Stacey Drain 10/08/2018, 1:55 PM

## 2018-10-09 ENCOUNTER — Telehealth: Payer: Self-pay | Admitting: Obstetrics & Gynecology

## 2018-10-09 NOTE — Telephone Encounter (Signed)
Given her history of lupus anticoagulant positivity and ongoing hormone use I would recommend she contact her PCP immediately to discuss further evaluation of SOB as she may need to be evaluated for blood clot. Will forward this to [place] Triage to call patient to make her aware. Her pulse ox and HR were normal at recent office visit and she is on Lovenox but this would need to be re-visited today if symptoms have progressed.   From a cardiac standpoint I will forward to Dr. Wyline Mood for further input, given baseline abnormal EKG but totally normal echocardiogram. Ronie Spies PA-C

## 2018-10-13 IMAGING — US US MFM OB LIMITED
1 series · 14 of 20 positions shown · non-contrast
Comparison: none

[Series 1: us mfm ob limited · 14 of 20 slices shown]
[im 1/20]
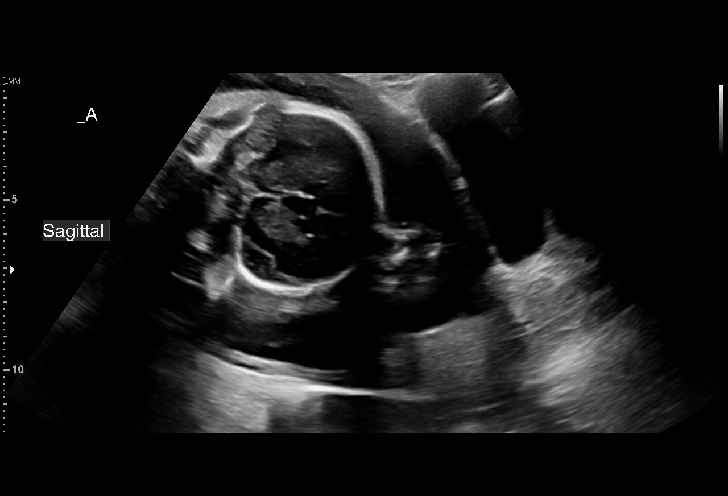
[im 3/20]
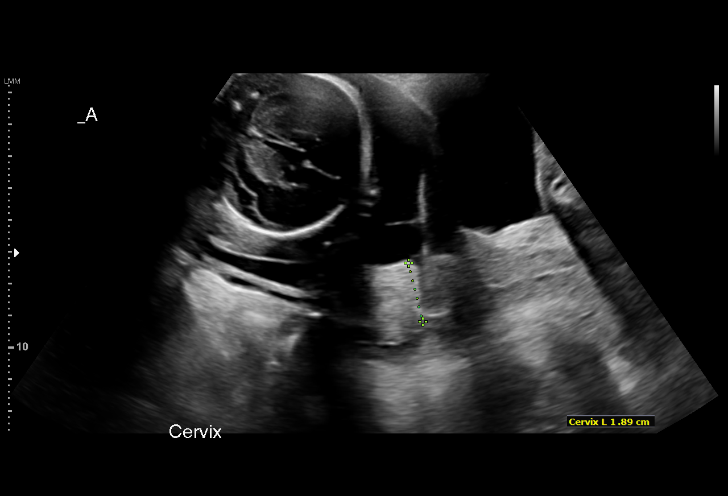
[im 4/20]
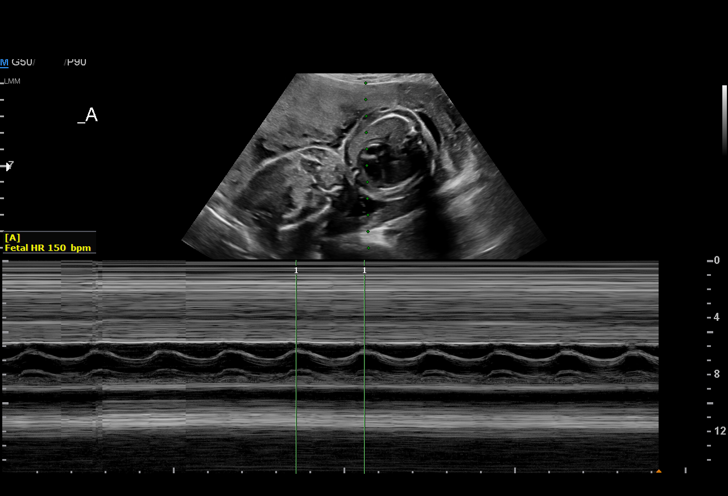
[im 6/20]
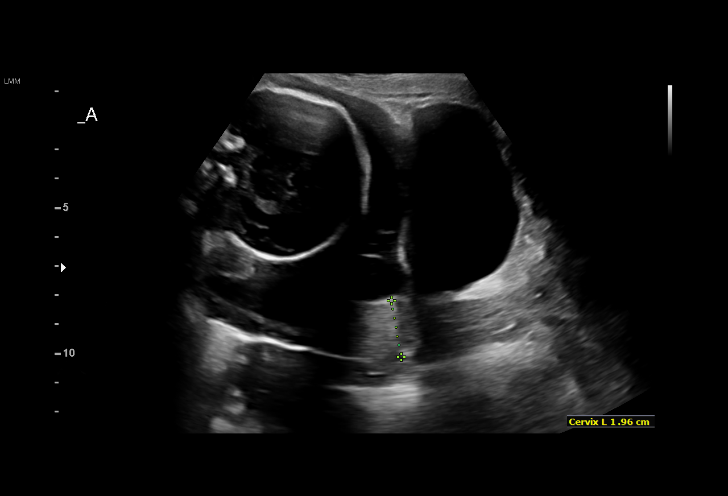
[im 7/20]
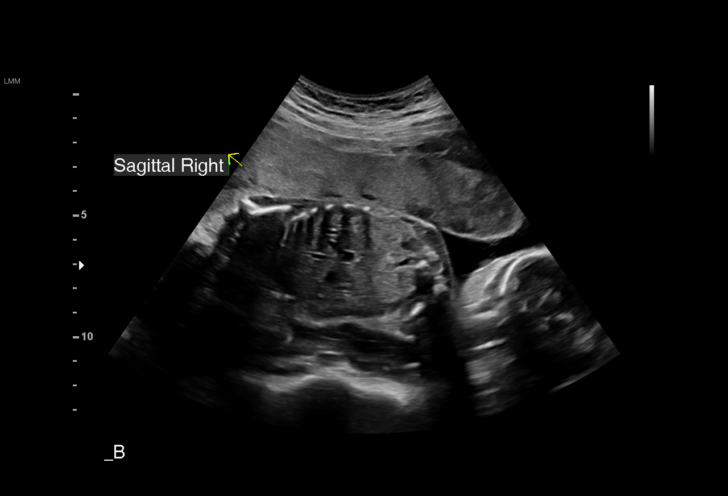
[im 8/20]
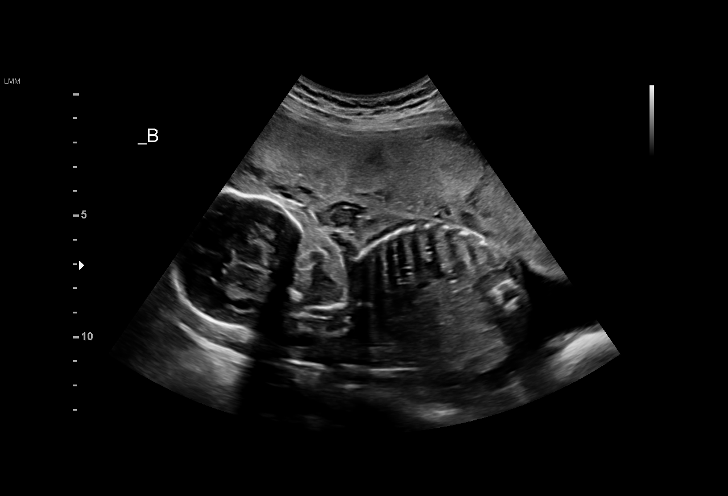
[im 10/20]
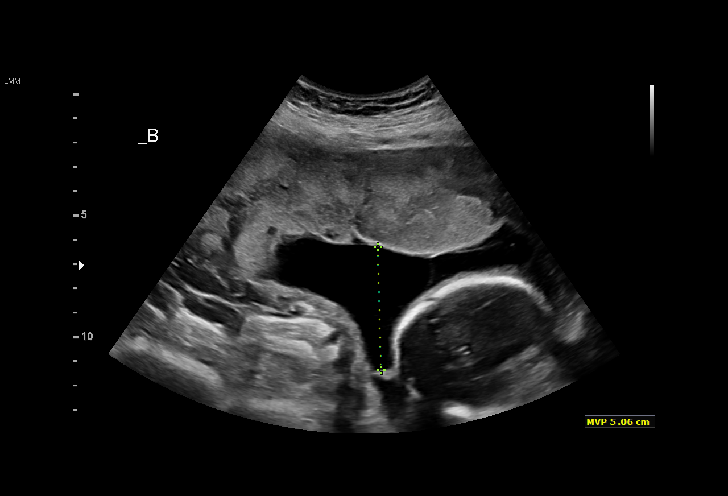
[im 11/20]
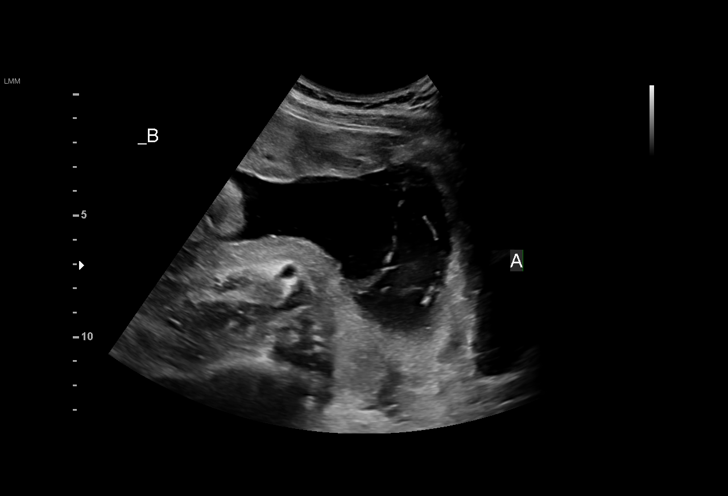
[im 13/20]
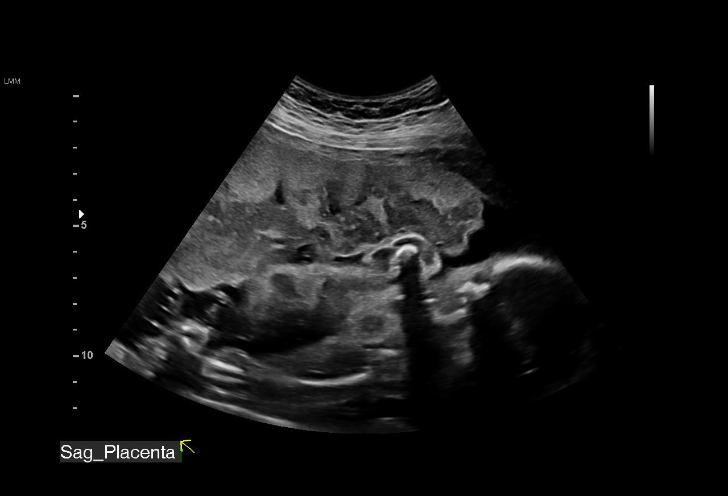
[im 14/20]
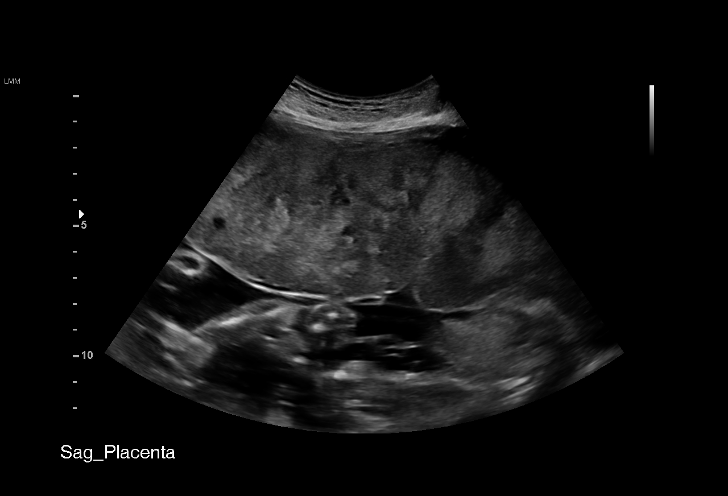
[im 16/20]
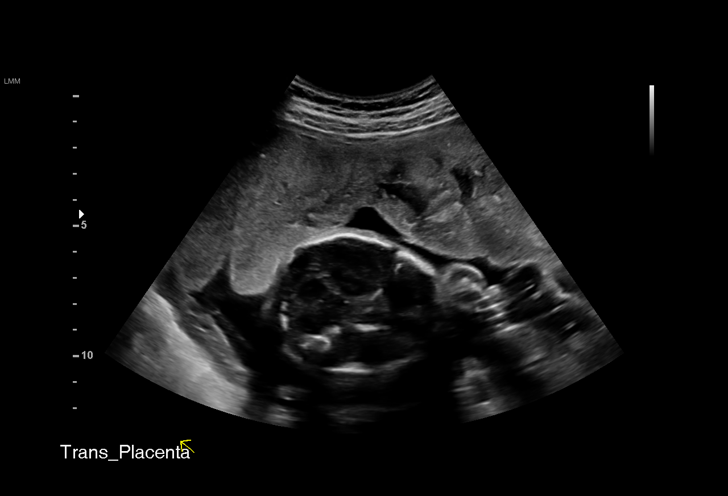
[im 17/20]
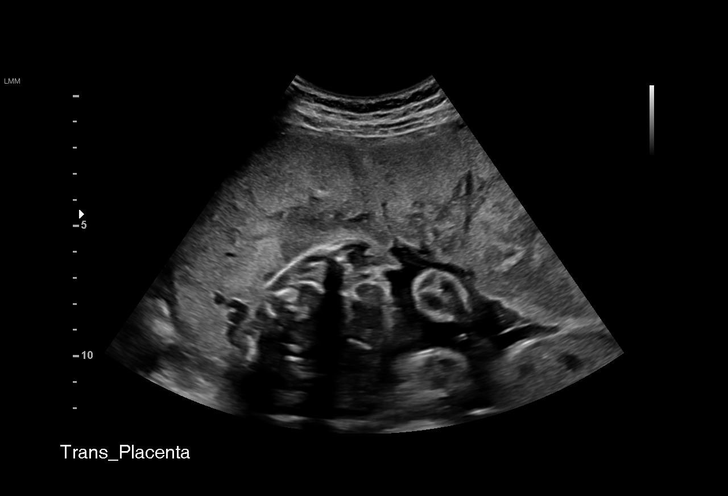
[im 18/20]
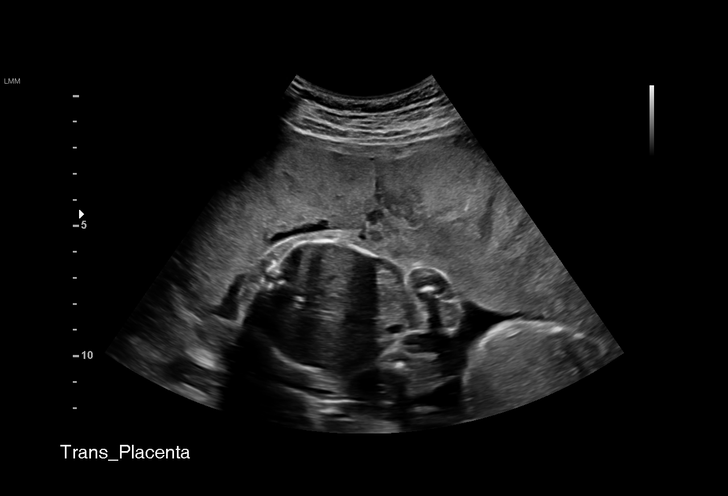
[im 20/20]
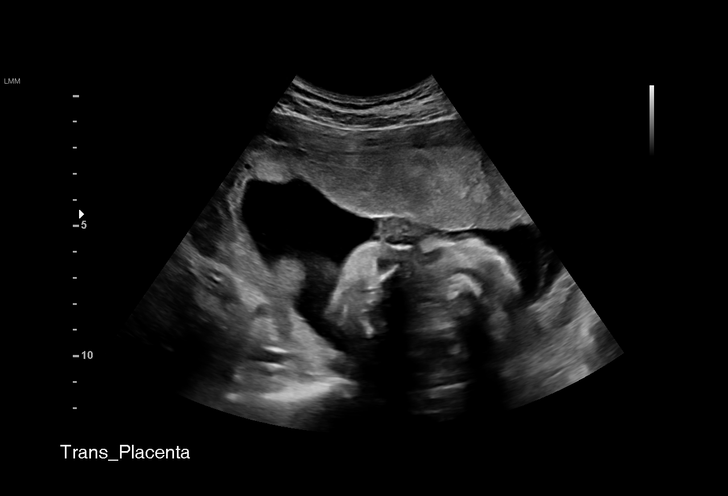

[14 of 20 positions shown; findings below may reference images not displayed]

MAU/Triage

Indications

25 weeks gestation of pregnancy
Leakage of amniotic fluid (negative fern;
positive Amnisure)
Twin pregnancy, Akiko/Odd-Martin, second trimester
Twin gestation, Twin-Twin transfusion; S/P
laser photocoagulation on [DATE]
Poor obstetric history-Recurrent (habitual)
abortion (3 consecutive ab's)( 1 Molar
pregnancy); Yari Lovenox
OB History

Blood Type:            Height:  5'3"   Weight (lb):  134       BMI:
Gravidity:    6         Term:   0        Prem:   0        SAB:   4
TOP:          0       Ectopic:  0        Living: 0
Fetal Evaluation (Fetus A)

Num Of Fetuses:     2
Fetal Heart         150
Rate(bpm):
Cardiac Activity:   Observed
Fetal Lie:          Lower Left Fetus
Presentation:       Cephalic
Placenta:           Anterior, above cervical os
Membrane Desc:      Dividing Membrane seen

Amniotic Fluid
AFI FV:      Subjectively within normal limits

Largest Pocket(cm)
6.08
Gestational Age (Fetus A)

LMP:           25w 0d        Date:  06/23/17                 EDD:   03/30/18
Best:          25w 0d     Det. By:  LMP  (06/23/17)          EDD:   03/30/18

Fetal Evaluation (Fetus B)

Num Of Fetuses:     2
Fetal Heart         142
Rate(bpm):
Cardiac Activity:   Observed
Fetal Lie:          Upper Right Fetus
Presentation:       Breech
Placenta:           Anterior, above cervical os
Membrane Desc:      Dividing Membrane seen

Amniotic Fluid
AFI FV:      Subjectively within normal limits

Largest Pocket(cm)
5.06
Gestational Age (Fetus B)

LMP:           25w 0d        Date:  06/23/17                 EDD:   03/30/18
Best:          25w 0d     Det. By:  LMP  (06/23/17)          EDD:   03/30/18
Cervix Uterus Adnexa

Cervix
Length:           1.92  cm.
Measured transabdominally.

Uterus
No abnormality visualized.
Impression

Monochorionic/diamniotic twin gestation with suspected
PPROM
Cephalic/breech presentation
Normal amniotic fluid in each sac with MVP 6.1 for A and
for B
Normal fetal movement and cardiac activity in both twins
Normal stomachs and bladders both twins
Transabdominal cervical evaluation appears shortened at
19mm
Recommendations

Continue clinical evaluation and management

## 2018-10-17 ENCOUNTER — Ambulatory Visit (INDEPENDENT_AMBULATORY_CARE_PROVIDER_SITE_OTHER): Payer: Commercial Managed Care - PPO | Admitting: Obstetrics & Gynecology

## 2018-10-17 ENCOUNTER — Encounter: Payer: Self-pay | Admitting: Obstetrics & Gynecology

## 2018-10-17 ENCOUNTER — Other Ambulatory Visit: Payer: Self-pay

## 2018-10-17 VITALS — BP 114/79 | HR 73 | Ht 64.0 in | Wt 137.0 lb

## 2018-10-17 DIAGNOSIS — O99119 Other diseases of the blood and blood-forming organs and certain disorders involving the immune mechanism complicating pregnancy, unspecified trimester: Principal | ICD-10-CM

## 2018-10-17 DIAGNOSIS — F410 Panic disorder [episodic paroxysmal anxiety] without agoraphobia: Secondary | ICD-10-CM | POA: Diagnosis not present

## 2018-10-17 DIAGNOSIS — D6862 Lupus anticoagulant syndrome: Secondary | ICD-10-CM | POA: Diagnosis not present

## 2018-10-17 MED ORDER — LORAZEPAM 1 MG PO TABS: 0.5000 mg | ORAL_TABLET | Freq: Three times a day (TID) | ORAL | 2 refills | Status: AC | PRN

## 2018-10-17 NOTE — Progress Notes (Signed)
Chief Complaint  Patient presents with  . blood clot issues      23 y.o. [id number] No LMP recorded. The current method of family planning is OCP (estrogen/progesterone).  Outpatient Encounter Medications as of 10/17/2018  Medication Sig  . albuterol (VENTOLIN HFA) 108 (90 Base) MCG/ACT inhaler Inhale 2 puffs into the lungs every 6 (six) hours as needed for wheezing or shortness of breath.  . desogestrel-ethinyl estradiol (KARIVA,AZURETTE,MIRCETTE) 0.15-0.02/0.01 MG (21/5) tablet Take 1 tablet by mouth daily.  Marland Kitchen enoxaparin (LOVENOX) 40 MG/0.4ML injection Inject 0.4 mLs (40 mg total) into the skin daily.  Marland Kitchen LORazepam (ATIVAN) 1 MG tablet Take 0.5-1 tablets (0.5-1 mg total) by mouth every 8 (eight) hours as needed for anxiety.  . medroxyPROGESTERone (DEPO-PROVERA) 150 MG/ML injection Inject 1 mL (150 mg total) into the muscle every 3 (three) months.  . Pediatric Multiple Vit-C-FA (FLINSTONES GUMMIES OMEGA-3 DHA PO) Take 1 tablet by mouth daily.   Marland Kitchen topiramate (TOPAMAX) 100 MG tablet Take 100 mg by mouth at bedtime.  . [DISCONTINUED] LORazepam (ATIVAN) 1 MG tablet Take 0.5-1 tablets (0.5-1 mg total) by mouth every 8 (eight) hours as needed for anxiety.  . docusate sodium (COLACE) 100 MG capsule Take 1 capsule (100 mg total) by mouth 2 (two) times daily as needed for mild constipation. (Patient not taking: Reported on 10/17/2018)  . promethazine (PHENERGAN) 25 MG suppository Place 1 suppository (25 mg total) rectally every 6 (six) hours as needed for nausea or vomiting. (Patient not taking: Reported on 10/17/2018)  . zolpidem (AMBIEN) 5 MG tablet TAKE 1 TABLET (5 MG TOTAL) BY MOUTH AT BEDTIME AS NEEDED FOR SLEEP.   No facility-administered encounter medications on file as of 10/17/2018.     Subjective Here for follow up of her ECHO and discuss spiral CT order Having some episodic SOB and tachycardia, suspect could be panic episodes Takes ativan infrequently Past Medical History:    Diagnosis Date  . Anxiety and depression   . Asthma   . Birth defect   . Complication of anesthesia    'I had stridor after my 1st D&C but with my 2nd one they gave me a breathing treatment before my surgery and I did ok".  . Cyst of left kidney   . Dysrhythmia    h/o SVT  . Family history of adverse reaction to anesthesia   . GERD (gastroesophageal reflux disease)   . Headache   . History of recurrent miscarriages   . Hypoglycemia   . Lupus anticoagulant positive   . Neuropathic pain   . Palpitations     Past Surgical History:  Procedure Laterality Date  . CESAREAN SECTION    . CHOLECYSTECTOMY N/A 07/23/2018   Procedure: LAPAROSCOPIC CHOLECYSTECTOMY;  Surgeon: Lucretia Roers, MD;  Location: [facility];  Service: General;  Laterality: N/A;  . DILATION AND CURETTAGE OF UTERUS N/A 01/29/2017   Procedure: SUCTION DILATATION AND CURETTAGE;  Surgeon: Tilda Burrow, MD;  Location: [facility];  Service: Gynecology;  Laterality: N/A;  . DILATION AND CURETTAGE OF UTERUS N/A 05/01/2017   Procedure: SUCTION DILATATION AND CURETTAGE;  Surgeon: Lazaro Arms, MD;  Location: [facility];  Service: Gynecology;  Laterality: N/A;  pt to arrive at 7:15 to have labwork  . FETOSCOPIC LASER PHOTOCOAGULATION  11/14/2017   of 34 placental vessels for TTTS  . WISDOM TOOTH EXTRACTION      OB History    Gravida  7   Para  1  Term      Preterm  1   AB  5   Living  2     SAB  4   TAB      Ectopic      Multiple  1   Live Births  2           Allergies  Allergen Reactions  . Pecan Nut (Diagnostic) Hives  . Reglan [Metoclopramide] Hives    Social History   Socioeconomic History  . Marital status: Single    Spouse name: Not on file  . Number of children: Not on file  . Years of education: Not on file  . Highest education level: Not on file  Occupational History  . Not on file  Social Needs  . Financial resource strain: Not on file  . Food insecurity:    Worry: Not on file     Inability: Not on file  . Transportation needs:    Medical: Not on file    Non-medical: Not on file  Tobacco Use  . Smoking status: Never Smoker  . Smokeless tobacco: Never Used  Substance and Sexual Activity  . Alcohol use: No    Alcohol/week: 0.0 standard drinks  . Drug use: No  . Sexual activity: Not Currently    Birth control/protection: None  Lifestyle  . Physical activity:    Days per week: Not on file    Minutes per session: Not on file  . Stress: Not on file  Relationships  . Social connections:    Talks on phone: Not on file    Gets together: Not on file    Attends religious service: Not on file    Active member of club or organization: Not on file    Attends meetings of clubs or organizations: Not on file    Relationship status: Not on file  Other Topics Concern  . Not on file  Social History Narrative  . Not on file    Family History  Adopted: Yes  Problem Relation Age of Onset  . Migraines Sister   . Hypertension Maternal Grandmother   . Skin cancer Maternal Grandmother   . Heart disease Maternal Grandfather     Medications:       Current Outpatient Medications:  .  albuterol (VENTOLIN HFA) 108 (90 Base) MCG/ACT inhaler, Inhale 2 puffs into the lungs every 6 (six) hours as needed for wheezing or shortness of breath., Disp: 1 Inhaler, Rfl: 0 .  desogestrel-ethinyl estradiol (KARIVA,AZURETTE,MIRCETTE) 0.15-0.02/0.01 MG (21/5) tablet, Take 1 tablet by mouth daily., Disp: 1 Package, Rfl: 11 .  enoxaparin (LOVENOX) 40 MG/0.4ML injection, Inject 0.4 mLs (40 mg total) into the skin daily., Disp: 4 Syringe, Rfl:  .  LORazepam (ATIVAN) 1 MG tablet, Take 0.5-1 tablets (0.5-1 mg total) by mouth every 8 (eight) hours as needed for anxiety., Disp: 15 tablet, Rfl: 2 .  medroxyPROGESTERone (DEPO-PROVERA) 150 MG/ML injection, Inject 1 mL (150 mg total) into the muscle every 3 (three) months., Disp: 1 mL, Rfl: 3 .  Pediatric Multiple Vit-C-FA (FLINSTONES GUMMIES OMEGA-3 DHA  PO), Take 1 tablet by mouth daily. , Disp: , Rfl:  .  topiramate (TOPAMAX) 100 MG tablet, Take 100 mg by mouth at bedtime., Disp: , Rfl:  .  docusate sodium (COLACE) 100 MG capsule, Take 1 capsule (100 mg total) by mouth 2 (two) times daily as needed for mild constipation. (Patient not taking: Reported on 10/17/2018), Disp: 60 capsule, Rfl: 2 .  promethazine (PHENERGAN) 25 MG  suppository, Place 1 suppository (25 mg total) rectally every 6 (six) hours as needed for nausea or vomiting. (Patient not taking: Reported on 10/17/2018), Disp: 12 each, Rfl: 0 .  zolpidem (AMBIEN) 5 MG tablet, TAKE 1 TABLET (5 MG TOTAL) BY MOUTH AT BEDTIME AS NEEDED FOR SLEEP., Disp: 30 tablet, Rfl: 2  Objective Blood pressure 114/79, pulse 73, height 5\' 4"  (1.626 m), weight 137 lb (62.1 kg), unknown if currently breastfeeding.  Gen WDWN NAD  Pertinent ROS No burning with urination, frequency or urgency No nausea, vomiting or diarrhea Nor fever chills or other constitutional symptoms   Labs or studies reviewed    Impression Diagnoses this Encounter::   ICD-10-CM   1. Lupus anticoagulant affecting pregnancy, antepartum (HCC) O99.119    D68.62   2. 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 LORazepam (ATIVAN) 1 MG tablet    Sig: Take 0.5-1 tablets (0.5-1 mg total) by mouth every 8 (eight) hours as needed for anxiety.    Dispense:  15 tablet    Refill:  2    Labs or Scans Ordered: No orders of the defined types were placed in this encounter.   Management:: >reviewed her ECHO  >she is scheduled for a spiral CT at [facility] >refill her ativan but given strict boundaries for its use  Follow up Return if symptoms worsen or fail to improve.        Face to face time:  10 minutes  Greater than 50% of the visit time was spent in counseling and coordination of care with the patient.  The  summary and outline of the counseling and care coordination is summarized in the note above.   All questions were answered.

## 2018-10-22 ENCOUNTER — Telehealth: Payer: Self-pay | Admitting: *Deleted

## 2018-10-22 NOTE — Telephone Encounter (Signed)
We don't do orders for [facility] as far as I know,   Tish call them and see if they can send a requisition and see if someone can sign it for a spiral CT looking for a pulmonary embolus

## 2018-10-22 NOTE — Telephone Encounter (Signed)
Spoke to [employer] who stated she would need CT Angio of Chest to r/o PE.  PCP ordered but Dr Despina Hidden is changing order, so new order would need to be sent along with pre-cert number.  Since we are not ordering the CT we cannot just change. I informed the patient of all of this and that she would need to cancel CT herself.  I did discuss with Dr Despina Hidden.  We would proceed with pre-cert unless she told us not to otherwise. Pt verbalized understanding.

## 2018-10-23 ENCOUNTER — Encounter: Payer: Self-pay | Admitting: *Deleted

## 2018-10-30 ENCOUNTER — Encounter: Payer: Self-pay | Admitting: Obstetrics & Gynecology

## 2018-11-04 ENCOUNTER — Encounter: Payer: Self-pay | Admitting: *Deleted

## 2018-12-18 ENCOUNTER — Telehealth: Payer: Self-pay | Admitting: Obstetrics & Gynecology

## 2018-12-18 ENCOUNTER — Other Ambulatory Visit: Payer: Self-pay | Admitting: Obstetrics & Gynecology

## 2018-12-18 DIAGNOSIS — R1031 Right lower quadrant pain: Secondary | ICD-10-CM

## 2018-12-18 NOTE — Telephone Encounter (Signed)
Pt came into the office stating that she might have appendicides and would like to see a provider. I let pt know that we do not have any opening. Pt would like a call back. Please contact pt

## 2018-12-18 NOTE — Telephone Encounter (Signed)
Pt called stating that she had seen her PCP a couple days ago and they checked her urine and sent it for culture. She states that her urine was negative but they put her on antibiotics any way. She is requesting an appt with Dr Despina HiddenEure to see if she has appendicitis as she states the pain is worsening on the right side. Advised that Dr Despina HiddenEure would order a CT to r/o appendicitis but she did not need to be seen. Pt declined CT stating that she would follow up with her PCP.

## 2018-12-22 ENCOUNTER — Ambulatory Visit: Payer: Commercial Managed Care - PPO | Admitting: Obstetrics & Gynecology

## 2018-12-29 ENCOUNTER — Other Ambulatory Visit: Payer: Self-pay

## 2018-12-29 ENCOUNTER — Emergency Department (HOSPITAL_COMMUNITY): Payer: Commercial Managed Care - PPO

## 2018-12-29 ENCOUNTER — Emergency Department (HOSPITAL_COMMUNITY)
Admission: EM | Admit: 2018-12-29 | Discharge: 2018-12-29 | Disposition: A | Discharge status: Home / Self Care | Payer: Commercial Managed Care - PPO | Attending: Emergency Medicine | Admitting: Emergency Medicine

## 2018-12-29 ENCOUNTER — Encounter (HOSPITAL_COMMUNITY): Payer: Self-pay

## 2018-12-29 DIAGNOSIS — R0602 Shortness of breath: Secondary | ICD-10-CM | POA: Insufficient documentation

## 2018-12-29 DIAGNOSIS — R079 Chest pain, unspecified: Secondary | ICD-10-CM | POA: Diagnosis not present

## 2018-12-29 DIAGNOSIS — Z79899 Other long term (current) drug therapy: Secondary | ICD-10-CM | POA: Diagnosis not present

## 2018-12-29 DIAGNOSIS — J45909 Unspecified asthma, uncomplicated: Secondary | ICD-10-CM | POA: Diagnosis not present

## 2018-12-29 LAB — CBC
HCT: 38.3 % (ref 36.0–46.0)
Hemoglobin: 13 g/dL (ref 12.0–15.0)
MCH: 28.6 pg (ref 26.0–34.0)
MCHC: 33.9 g/dL (ref 30.0–36.0)
MCV: 84.2 fL (ref 80.0–100.0)
NRBC: 0 % (ref 0.0–0.2)
Platelets: 222 10*3/uL (ref 150–400)
RBC: 4.55 MIL/uL (ref 3.87–5.11)
RDW: 12.1 % (ref 11.5–15.5)
WBC: 6.2 10*3/uL (ref 4.0–10.5)

## 2018-12-29 LAB — I-STAT TROPONIN, ED
Troponin i, poc: 0 ng/mL (ref 0.00–0.08)
Troponin i, poc: 0 ng/mL (ref 0.00–0.08)

## 2018-12-29 LAB — BASIC METABOLIC PANEL
Anion gap: 9 (ref 5–15)
BUN: 11 mg/dL (ref 6–20)
CHLORIDE: 103 mmol/L (ref 98–111)
CO2: 27 mmol/L (ref 22–32)
Calcium: 9.1 mg/dL (ref 8.9–10.3)
Creatinine, Ser: 0.7 mg/dL (ref 0.44–1.00)
GFR calc Af Amer: >60 mL/min (ref >60)
GFR calc non Af Amer: >60 mL/min (ref >60)
Glucose, Bld: 104 mg/dL — ABNORMAL HIGH (ref 70–99)
Potassium: 3.7 mmol/L (ref 3.5–5.1)
Sodium: 139 mmol/L (ref 135–145)

## 2018-12-29 LAB — I-STAT BETA HCG BLOOD, ED (MC, WL, AP ONLY): I-stat hCG, quantitative: <5 m[IU]/mL (ref <5)

## 2018-12-29 LAB — D-DIMER, QUANTITATIVE: D-Dimer, Quant: 0.39 ug/mL-FEU (ref 0.00–0.50)

## 2018-12-29 NOTE — Discharge Instructions (Signed)
You can take Tylenol or Ibuprofen as directed for pain. You can alternate Tylenol and Ibuprofen every 4 hours. If you take Tylenol at 1pm, then you can take Ibuprofen at 5pm. Then you can take Tylenol again at 9pm.   Follow-up with your primary care doctor in the next 2 to 4 days for further evaluation.  Return to the Emergency Department immediately if you experiencing worsening chest pain, difficulty breathing, nausea/vomiting, get very sweaty, headache or any other worsening or concerning symptoms.

## 2018-12-29 NOTE — ED Notes (Signed)
Discharge instructions discussed with Pt. Pt verbalized understanding. Pt stable and ambulatory.    

## 2018-12-29 NOTE — ED Triage Notes (Signed)
Pt here for shortness of breath and chest pian for the last 2 days.  Hx of being on blood thinners and taking lovenox daily for lupus problems with pregnancy.

## 2018-12-29 NOTE — ED Provider Notes (Signed)
[facility] Provider Note   CSN: [id number] Arrival date & time: 12/29/18  1626     History   Chief Complaint Chief Complaint  Patient presents with  . Chest Pain  . Shortness of Breath    HPI [name] is a 23 y.o. female past medical history of dysrhythmia, lupus who presents for evaluation of 2 days of constant chest pressure.  She states that chest pressure is constant in nature.  She states that it is not worse with exertion but is worse with deep inspiration.  She has not noticed any exertional shortness of breath but does feel like when she tries to take a deep breath and it hurts more.  She states that it is not worse by any positional changes.  She states that she did not have any preceding trauma, injury.  She denies any recent heavy lifting or workouts.  She has not tried any medications for the pain.  Patient does report that she is recently been sick with a GI bug with some nausea/vomiting but states that that has improved.  Patient states that she is not a current smoker and denies any IV or cocaine use.  She does report a family history of cardiac disease but states that nobody died before the age of 22 of heart attacks.  Patient does report that she currently takes OCPs.  Patient denies any leg swelling, recent travel, history of blood clots, recent surgeries, recent hospitalizations.  She does report that while she was pregnant, she was getting Lovenox injections.  This continued shortly after her pregnancy but they were stopped.  The history is provided by the patient.    Past Medical History:  Diagnosis Date  . Anxiety and depression   . Asthma   . Birth defect   . Complication of anesthesia    'I had stridor after my 1st D&C but with my 2nd one they gave me a breathing treatment before my surgery and I did ok".  . Cyst of left kidney   . Dysrhythmia    h/o SVT  . Family history of adverse reaction to anesthesia   . GERD  (gastroesophageal reflux disease)   . Headache   . History of recurrent miscarriages   . Hypoglycemia   . Lupus anticoagulant positive   . Neuropathic pain   . Palpitations     Patient Active Problem List   Diagnosis Date Noted  . Short interval between pregnancies affecting pregnancy, antepartum 04/28/2018  . Lupus anticoagulant affecting pregnancy, antepartum (HCC) 04/28/2018  . Previous cesarean section 04/28/2018  . Gestational thrombocytopenia (HCC) 08/17/2017  . 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 CESAREAN SECTION    . CHOLECYSTECTOMY N/A 07/23/2018   Procedure: LAPAROSCOPIC CHOLECYSTECTOMY;  Surgeon: Lucretia RoersBridges, Lindsay C, MD;  Location: [facility];  Service: General;  Laterality: N/A;  . DILATION AND CURETTAGE OF UTERUS N/A 01/29/2017   Procedure: SUCTION DILATATION AND CURETTAGE;  Surgeon: Tilda BurrowJohn Ferguson V, MD;  Location: [facility];  Service: Gynecology;  Laterality: N/A;  . DILATION AND CURETTAGE OF UTERUS N/A 05/01/2017   Procedure: SUCTION DILATATION AND CURETTAGE;  Surgeon: Lazaro ArmsLuther H Eure, MD;  Location: [facility];  Service: Gynecology;  Laterality: N/A;  pt to arrive at 7:15 to have labwork  . FETOSCOPIC LASER PHOTOCOAGULATION  11/14/2017   of 34 placental vessels for TTTS  . WISDOM TOOTH EXTRACTION       OB  History    Gravida  7   Para  1   Term      Preterm  1   AB  5   Living  2     SAB  4   TAB      Ectopic      Multiple  1   Live Births  2            Home Medications    Prior to Admission medications   Medication Sig Start Date End Date Taking? Authorizing Provider  albuterol (VENTOLIN HFA) 108 (90 Base) MCG/ACT inhaler Inhale 2 puffs into the lungs every 6 (six) hours as needed for wheezing or shortness of breath. 07/23/18   Lucretia RoersBridges, Lindsay C, MD  desogestrel-ethinyl estradiol (KARIVA,AZURETTE,MIRCETTE) 0.15-0.02/0.01 MG (21/5) tablet Take 1 tablet by mouth daily. 06/24/18   Lazaro ArmsEure, Luther  H, MD  docusate sodium (COLACE) 100 MG capsule Take 1 capsule (100 mg total) by mouth 2 (two) times daily as needed for mild constipation. Patient not taking: Reported on 10/17/2018 07/23/18 07/23/19  Lucretia RoersBridges, Lindsay C, MD  enoxaparin (LOVENOX) 40 MG/0.4ML injection Inject 0.4 mLs (40 mg total) into the skin daily. 09/08/18   Lazaro ArmsEure, Luther H, MD  LORazepam (ATIVAN) 1 MG tablet Take 0.5-1 tablets (0.5-1 mg total) by mouth every 8 (eight) hours as needed for anxiety. 10/17/18 10/17/19  Lazaro ArmsEure, Luther H, MD  medroxyPROGESTERone (DEPO-PROVERA) 150 MG/ML injection Inject 1 mL (150 mg total) into the muscle every 3 (three) months. 09/02/18   Lazaro ArmsEure, Luther H, MD  Pediatric Multiple Vit-C-FA (FLINSTONES GUMMIES OMEGA-3 DHA PO) Take 1 tablet by mouth daily.     [provider]  promethazine (PHENERGAN) 25 MG suppository Place 1 suppository (25 mg total) rectally every 6 (six) hours as needed for nausea or vomiting. Patient not taking: Reported on 10/17/2018 07/31/18   Lucretia RoersBridges, Lindsay C, MD  topiramate (TOPAMAX) 100 MG tablet Take 100 mg by mouth at bedtime.    [provider]  zolpidem (AMBIEN) 5 MG tablet TAKE 1 TABLET (5 MG TOTAL) BY MOUTH AT BEDTIME AS NEEDED FOR SLEEP. 09/15/18 10/15/18  Lazaro ArmsEure, Luther H, MD    Family History Family History  Adopted: Yes  Problem Relation Age of Onset  . Migraines Sister   . Hypertension Maternal Grandmother   . Skin cancer Maternal Grandmother   . Heart disease Maternal Grandfather     Social History Social History   Tobacco Use  . Smoking status: Never Smoker  . Smokeless tobacco: Never Used  Substance Use Topics  . Alcohol use: No    Alcohol/week: 0.0 standard drinks  . Drug use: No     Allergies   Pecan nut (diagnostic) and Reglan [metoclopramide]   Review of Systems Review of Systems  Constitutional: Negative for fever.  Respiratory: Negative for cough and shortness of breath.   Cardiovascular: Positive for chest pain. Negative for  leg swelling.  Gastrointestinal: Negative for abdominal pain, nausea and vomiting.  Genitourinary: Negative for dysuria and hematuria.  Neurological: Negative for headaches.  All other systems reviewed and are negative.    Physical Exam Updated Vital Signs BP 106/74 (BP Location: Right Arm)   Pulse 85   Temp 98.2 F (36.8 C) (Oral)   Resp 12   SpO2 99%   Physical Exam Vitals signs and nursing note reviewed.  Constitutional:      Appearance: Normal appearance. She is well-developed.     Comments: Sitting comfortably on examination table  HENT:  Head: Normocephalic and atraumatic.  Eyes:     General: Lids are normal.     Conjunctiva/sclera: Conjunctivae normal.     Pupils: Pupils are equal, round, and reactive to light.  Neck:     Musculoskeletal: Full passive range of motion without pain.  Cardiovascular:     Rate and Rhythm: Normal rate and regular rhythm.     Pulses: Normal pulses.     Heart sounds: Normal heart sounds. No murmur. No friction rub. No gallop.   Pulmonary:     Effort: Pulmonary effort is normal.     Breath sounds: Normal breath sounds.     Comments: Lungs clear to auscultation bilaterally.  Symmetric chest rise.  No wheezing, rales, rhonchi.  Speaking in full sentences without any difficulty. Abdominal:     Palpations: Abdomen is soft. Abdomen is not rigid.     Tenderness: There is no abdominal tenderness. There is no guarding.  Musculoskeletal: Normal range of motion.     Comments: Bilateral lower extremities are symmetric in appearance without any overlying warmth, erythema, edema.  Skin:    General: Skin is warm and dry.     Capillary Refill: Capillary refill takes less than 2 seconds.  Neurological:     Mental Status: She is alert and oriented to person, place, and time.  Psychiatric:        Speech: Speech normal.      ED Treatments / Results  Labs (all labs ordered are listed, but only abnormal results are displayed) Labs Reviewed    BASIC METABOLIC PANEL - Abnormal; Notable for the following components:      Result Value   Glucose, Bld 104 (*)    All other components within normal limits  CBC  D-DIMER, QUANTITATIVE (NOT AT [facility])  I-STAT TROPONIN, ED  I-STAT BETA HCG BLOOD, ED ([facility], [facility], [facility] ONLY)  I-STAT TROPONIN, ED    EKG EKG Interpretation  Date/Time:  Monday December 29 2018 17:00:33 EST Ventricular Rate:  79 PR Interval:  152 QRS Duration: 70 QT Interval:  332 QTC Calculation: 380 R Axis:   68 Text Interpretation:  Normal sinus rhythm Nonspecific T wave abnormality Confirmed by Cathren Laine ([id number]) on 12/29/2018 9:00:27 PM   Radiology Dg Chest 2 View  Result Date: 12/29/2018 CLINICAL DATA:  Chest pain EXAM: CHEST - 2 VIEW COMPARISON:  10/30/2018, chest x-ray 01/29/2017 FINDINGS: The heart size and mediastinal contours are within normal limits. Both lungs are clear. The visualized skeletal structures are unremarkable. IMPRESSION: No active cardiopulmonary disease. Electronically Signed   By: Jasmine Pang M.D.   On: 12/29/2018 17:55    Procedures Procedures (including critical care time)  Medications Ordered in ED Medications - No data to display   Initial Impression / Assessment and Plan / ED Course  I have reviewed the triage vital signs and the nursing notes.  Pertinent labs & imaging results that were available during my care of the patient were reviewed by me and considered in my medical decision making (see chart for details).     23 year old female past medical history of lupus presents for evaluation of 2 days of constant chest pressure.  Worse with deep inspiration.  Feels like she cannot take a big deep breath in.  She is also currently on OCPs. Patient is afebrile, non-toxic appearing, sitting comfortably on examination table. Vital signs reviewed and stable.  Low suspicion for ACS etiology given history/physical exam.  Consider infectious etiology.  Additionally, given her history of  lupus  and OCP use, consider PE.  Plan for d-dimer.  History/physical exam not concerning for pericarditis, endocarditis.  Additionally, this could be inflammation given her recent sickness.  Initial labs ordered at triage.  Initial troponin negative. I-STAT beta negative.  CBC without any significant leukocytosis or anemia.  BMP unremarkable.  Chest x-ray negative for any infectious etiology.  D-dimer negative. Second troponin negative.  Discussed results with patient. At this time, patient exhibits no emergent life-threatening condition that require further evaluation in ED. Instructed patient to follow-up with primary care doctor as directed. Patient had ample opportunity for questions and discussion. All patient's questions were answered with full understanding. Strict return precautions discussed. Patient expresses understanding and agreement to plan.   Portions of this note were generated with Scientist, clinical (histocompatibility and immunogenetics). Dictation errors may occur despite best attempts at proofreading.    Final Clinical Impressions(s) / ED Diagnoses   Final diagnoses:  Chest pain, unspecified type    ED Discharge Orders    None       Rosana Hoes 12/29/18 2141    Cathren Laine, MD 12/29/18 2244

## 2019-01-15 ENCOUNTER — Other Ambulatory Visit: Payer: Self-pay

## 2019-01-16 ENCOUNTER — Telehealth: Payer: Self-pay | Admitting: *Deleted

## 2019-01-16 ENCOUNTER — Other Ambulatory Visit: Payer: Self-pay | Admitting: Obstetrics & Gynecology

## 2019-01-16 MED ORDER — ZOLPIDEM TARTRATE 5 MG PO TABS: 5.0000 mg | ORAL_TABLET | Freq: Every evening | ORAL | 2 refills | Status: DC | PRN

## 2019-01-16 NOTE — Telephone Encounter (Signed)
I already did it

## 2019-01-19 ENCOUNTER — Encounter (HOSPITAL_COMMUNITY): Payer: Self-pay | Admitting: *Deleted

## 2019-01-19 NOTE — Progress Notes (Signed)
I called patient for introductory phone call. Patient was aware of referral and was advised of my role in her care here at the office. She was scheduled for new patient appointment.

## 2019-01-27 ENCOUNTER — Telehealth: Payer: Self-pay | Admitting: *Deleted

## 2019-01-27 NOTE — Telephone Encounter (Signed)
Pt requesting refill on Depo. Informed pt that 3 refills had been sent to her pharmacy in September. Advised pt to call her pharmacy to have Rx refilled. Pt verbalized understanding.

## 2019-01-28 ENCOUNTER — Encounter (HOSPITAL_COMMUNITY): Payer: Self-pay | Admitting: Hematology

## 2019-01-28 ENCOUNTER — Inpatient Hospital Stay (HOSPITAL_COMMUNITY): Payer: Commercial Managed Care - PPO | Attending: Hematology | Admitting: Hematology

## 2019-01-28 ENCOUNTER — Other Ambulatory Visit: Payer: Self-pay

## 2019-01-28 ENCOUNTER — Inpatient Hospital Stay (HOSPITAL_COMMUNITY): Payer: Commercial Managed Care - PPO

## 2019-01-28 VITALS — BP 116/78 | HR 75 | Temp 98.0°F | Resp 16 | Ht 63.5 in | Wt 133.3 lb

## 2019-01-28 DIAGNOSIS — G629 Polyneuropathy, unspecified: Secondary | ICD-10-CM

## 2019-01-28 DIAGNOSIS — Z8249 Family history of ischemic heart disease and other diseases of the circulatory system: Secondary | ICD-10-CM | POA: Diagnosis not present

## 2019-01-28 DIAGNOSIS — Z79899 Other long term (current) drug therapy: Secondary | ICD-10-CM

## 2019-01-28 DIAGNOSIS — M2550 Pain in unspecified joint: Secondary | ICD-10-CM | POA: Diagnosis not present

## 2019-01-28 DIAGNOSIS — D6862 Lupus anticoagulant syndrome: Secondary | ICD-10-CM | POA: Diagnosis not present

## 2019-01-28 DIAGNOSIS — N96 Recurrent pregnancy loss: Secondary | ICD-10-CM | POA: Diagnosis not present

## 2019-01-28 LAB — TSH: TSH: 0.85 u[IU]/mL (ref 0.350–4.500)

## 2019-01-28 LAB — C-REACTIVE PROTEIN: CRP: <0.8 mg/dL (ref <1.0)

## 2019-01-28 LAB — SEDIMENTATION RATE: Sed Rate: 4 mm/hr (ref 0–22)

## 2019-01-28 LAB — VITAMIN B12: VITAMIN B 12: 403 pg/mL (ref 180–914)

## 2019-01-28 NOTE — Assessment & Plan Note (Addendum)
1.  Recurrent loss of pregnancies: - She had late first trimester miscarriages x5.  She was apparently positive for lupus anticoagulant 1 time.  She had to take Lovenox 40 mg daily injections throughout her subsequent pregnancy.  She now has twin girls that were born around 26 weeks. - Also complains of diffuse body pains and arthralgias. -No family history of lupus anticoagulant syndrome.  Paternal grandmother reportedly had lupus. -No personal history of thrombosis. - I have recommended checking a lupus anticoagulant, anticardiolipin antibody and anti-beta-2 glycoprotein 1 antibody. - If the lupus anticoagulant testing is positive, it needs to be confirmed with another positive test at least 12 weeks apart.  2.  Neuropathy: -Reportedly developed neuropathy in the feet approximately a year ago.  She is on gabapentin twice daily. -I have recommended checking a B12, methylmalonic acid, hepatitis panel, ANA, rheumatoid factor, TSH and SPEP.

## 2019-01-28 NOTE — Progress Notes (Signed)
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   HISTORY OF PRESENTING ILLNESS:  Carol Bernard 24 y.o. 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 She presents to our clinic today with generalized pain and weakness. She has a decreased appetite and lightheadedness. She has had recurrent infections over the past 2 months. She is pale and feels tired. She reports she has had 5 miscarriages all ending at the end of her first trimester. She now has twin girls that were born at 63 weeks. While she was pregnant she had a positive lupus anticoagulant test. She took Lovenox injections 40 mg daily throughout her pregnancy. She also had to receive 2 unit of blood while she was pregnant. 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 She was placed on Gabapentin which has helped. Denies any nausea, vomiting, or diarrhea. Denies any new pains. Had not noticed any recent bleeding such as epistaxis, hematuria or hematochezia. Denies recent chest pain on exertion, shortness of breath on minimal exertion, pre-syncopal episodes, or palpitations.  Denies any recent fevers, infections, or recent hospitalizations. Patient reports appetite at 50% and energy level at 50%. She lives alone with her boyfriend and two daughters.  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  She has a family history of a paternal grandmother with lupus. Maternal grandfather with prostate cancer. Maternal grandmother with skin cancer.    MEDICAL HISTORY:  Past Medical History:  Diagnosis Date  . Allergy   . Anxiety and depression   . Asthma   . Birth defect   . Blood transfusion without reported diagnosis   . Clotting disorder (HCC)   . Complication of anesthesia     'I had stridor after my 1st D&C but with my 2nd one they gave me a breathing treatment before my surgery and I did ok".  . Cyst of left kidney    frequent cysts to kidneys  . Dysrhythmia    h/o SVT  . Family history of adverse reaction to anesthesia   . GERD (gastroesophageal reflux disease)   . Headache   . History of recurrent miscarriages   . Hypoglycemia   . Lupus anticoagulant positive   . Neuropathic pain   . Palpitations     SURGICAL HISTORY: Past Surgical History:  Procedure Laterality Date  . CESAREAN SECTION    . CHOLECYSTECTOMY N/A 07/23/2018   Procedure: LAPAROSCOPIC CHOLECYSTECTOMY;  Surgeon: Lucretia Roers, MD;  Location: [facility];  Service: General;  Laterality: N/A;  . DILATION AND CURETTAGE OF UTERUS N/A 01/29/2017   Procedure: SUCTION DILATATION AND CURETTAGE;  Surgeon: Tilda Burrow, MD;  Location: [facility];  Service: Gynecology;  Laterality: N/A;  . DILATION AND CURETTAGE OF UTERUS N/A 05/01/2017   Procedure: SUCTION DILATATION AND CURETTAGE;  Surgeon: Lazaro Arms, MD;  Location: [facility];  Service: Gynecology;  Laterality: N/A;  pt to arrive at 7:15 to have labwork  . FETOSCOPIC LASER PHOTOCOAGULATION  11/14/2017   of 34 placental vessels for TTTS  . WISDOM TOOTH EXTRACTION      SOCIAL HISTORY: Social History   Socioeconomic History  . Marital status: Single    Spouse name: Not on file  . Number of children: Not on file  . Years  of education: Not on file  . Highest education level: Not on file  Occupational History  . Not on file  Social Needs  . Financial resource strain: Not on file  . Food insecurity:    Worry: Not on file    Inability: Not on file  . Transportation needs:    Medical: Not on file    Non-medical: Not on file  Tobacco Use  . Smoking status: Never Smoker  . Smokeless tobacco: Never Used  Substance and Sexual Activity  . Alcohol use: No    Alcohol/week: 0.0 standard drinks  . Drug use: No  . Sexual activity: Not Currently     Birth control/protection: None  Lifestyle  . Physical activity:    Days per week: Not on file    Minutes per session: Not on file  . Stress: Not on file  Relationships  . Social connections:    Talks on phone: Not on file    Gets together: Not on file    Attends religious service: Not on file    Active member of club or organization: Not on file    Attends meetings of clubs or organizations: Not on file    Relationship status: Not on file  . Intimate partner violence:    Fear of current or ex partner: Not on file    Emotionally abused: Not on file    Physically abused: Not on file    Forced sexual activity: Not on file  Other Topics Concern  . Not on file  Social History Narrative  . Not on file    FAMILY HISTORY: Family History  Adopted: Yes  Problem Relation Age of Onset  . Migraines Sister   . Hypertension Maternal Grandmother   . Skin cancer Maternal Grandmother   . Heart disease Maternal Grandfather     ALLERGIES:  is allergic to pecan nut (diagnostic) and reglan [metoclopramide].  MEDICATIONS:  Current Outpatient Medications  Medication Sig Dispense Refill  . albuterol (VENTOLIN HFA) 108 (90 Base) MCG/ACT inhaler Inhale 2 puffs into the lungs every 6 (six) hours as needed for wheezing or shortness of breath. 1 Inhaler 0  . gabapentin (NEURONTIN) 300 MG capsule gabapentin 300 mg capsule    . lidocaine (LIDODERM) 5 % Lidoderm 5 % topical patch  APPLY 1 PATCH BY TRANSDERMAL ROUTE ONCE DAILY (MAY WEAR UP TO 12HOURS.)    . medroxyPROGESTERone (DEPO-PROVERA) 150 MG/ML injection Inject 1 mL (150 mg total) into the muscle every 3 (three) months. 1 mL 3  . topiramate (TOPAMAX) 100 MG tablet Take 100 mg by mouth at bedtime.    . docusate sodium (COLACE) 100 MG capsule Take 1 capsule (100 mg total) by mouth 2 (two) times daily as needed for mild constipation. (Patient not taking: Reported on 01/28/2019) 60 capsule 2  . enoxaparin (LOVENOX) 40 MG/0.4ML injection Inject 0.4  mLs (40 mg total) into the skin daily. (Patient not taking: Reported on 01/28/2019) 4 Syringe   . LORazepam (ATIVAN) 1 MG tablet Take 0.5-1 tablets (0.5-1 mg total) by mouth every 8 (eight) hours as needed for anxiety. (Patient not taking: Reported on 01/28/2019) 15 tablet 2  . promethazine (PHENERGAN) 25 MG suppository Place 1 suppository (25 mg total) rectally every 6 (six) hours as needed for nausea or vomiting. (Patient not taking: Reported on 01/28/2019) 12 each 0  . zolpidem (AMBIEN) 5 MG tablet Take 1 tablet (5 mg total) by mouth at bedtime as needed for sleep. (Patient not taking: Reported on  01/28/2019) 30 tablet 2   No current facility-administered medications for this visit.     REVIEW OF SYSTEMS:   Constitutional: Denies fevers, chills or abnormal night sweats Eyes: Denies blurriness of vision, double vision or watery eyes Ears, nose, mouth, throat, and face: Denies mucositis or sore throat Respiratory: Denies cough, dyspnea or wheezes Cardiovascular: Denies palpitation, chest discomfort or lower extremity swelling Gastrointestinal:  Denies nausea, heartburn or change in bowel habits Skin: Denies abnormal skin rashes Lymphatics: Denies new lymphadenopathy or easy bruising Neurological:Denies numbness, tingling or new weaknesses Behavioral/Psych: Mood is stable, no new changes  All other systems were reviewed with the patient and are negative.  PHYSICAL EXAMINATION: ECOG PERFORMANCE STATUS: 1 - Symptomatic but completely ambulatory  Vitals:   01/28/19 1300  BP: 116/78  Pulse: 75  Resp: 16  Temp: 98 F (36.7 C)  SpO2: 100%   Filed Weights   01/28/19 1300  Weight: 133 lb 5 oz (60.5 kg)    GENERAL:alert, no distress and comfortable SKIN: skin color, texture, turgor are normal, no rashes or significant lesions.  Malar erythema present. EYES: normal, conjunctiva are pink and non-injected, sclera clear OROPHARYNX:no exudate, no erythema and lips, buccal mucosa, and tongue  normal  NECK: supple, thyroid normal size, non-tender, without nodularity LYMPH:  no palpable lymphadenopathy in the cervical, axillary or inguinal LUNGS: clear to auscultation and percussion with normal breathing effort HEART: regular rate & rhythm and no murmurs and no lower extremity edema ABDOMEN:abdomen soft, non-tender and normal bowel sounds Musculoskeletal:no cyanosis of digits and no clubbing  PSYCH: alert & oriented x 3 with fluent speech NEURO: no focal motor/sensory deficits  LABORATORY DATA:  I have reviewed the data as listed Recent Results (from the past 2160 hour(s))  Basic metabolic panel     Status: Abnormal   Collection Time: 12/29/18  5:07 PM  Result Value Ref Range   Sodium 139 135 - 145 mmol/L   Potassium 3.7 3.5 - 5.1 mmol/L   Chloride 103 98 - 111 mmol/L   CO2 27 22 - 32 mmol/L   Glucose, Bld 104 (H) 70 - 99 mg/dL   BUN 11 6 - 20 mg/dL   Creatinine, Ser 1.61 0.44 - 1.00 mg/dL   Calcium 9.1 8.9 - 09.6 mg/dL   GFR calc non Af Amer >60 >60 mL/min   GFR calc Af Amer >60 >60 mL/min   Anion gap 9 5 - 15    Comment: Performed at [facility], [address], [place], [place] [address]  CBC     Status: None   Collection Time: 12/29/18  5:07 PM  Result Value Ref Range   WBC 6.2 4.0 - 10.5 K/uL   RBC 4.55 3.87 - 5.11 MIL/uL   Hemoglobin 13.0 12.0 - 15.0 g/dL   HCT 98.1 19.1 - 47.8 %   MCV 84.2 80.0 - 100.0 fL   MCH 28.6 26.0 - 34.0 pg   MCHC 33.9 30.0 - 36.0 g/dL   RDW 29.5 62.1 - 30.8 %   Platelets 222 150 - 400 K/uL   nRBC 0.0 0.0 - 0.2 %    Comment: Performed at [facility], [address] [place], [place] [address]  I-stat troponin, ED     Status: None   Collection Time: 12/29/18  5:14 PM  Result Value Ref Range   Troponin i, poc 0.00 0.00 - 0.08 ng/mL   Comment 3  Comment: Due to the release kinetics of cTnI, a negative result within the first hours of the onset of symptoms does not rule out myocardial infarction with  certainty. If myocardial infarction is still suspected, repeat the test at appropriate intervals.   I-Stat beta hCG blood, ED     Status: None   Collection Time: 12/29/18  5:14 PM  Result Value Ref Range   I-stat hCG, quantitative <5.0 <5 mIU/mL   Comment 3            Comment:   GEST. AGE      CONC.  (mIU/mL)   <=1 WEEK        5 - 50     2 WEEKS       50 - 500     3 WEEKS       100 - 10,000     4 WEEKS     1,000 - 30,000        FEMALE AND NON-PREGNANT FEMALE:     LESS THAN 5 mIU/mL   D-dimer, quantitative (not at [facility])     Status: None   Collection Time: 12/29/18  9:02 PM  Result Value Ref Range   D-Dimer, Quant 0.39 0.00 - 0.50 ug/mL-FEU    Comment: (NOTE) At the manufacturer cut-off of 0.50 ug/mL FEU, this assay has been documented to exclude PE with a sensitivity and negative predictive value of 97 to 99%.  At this time, this assay has not been approved by the FDA to exclude DVT/VTE. Results should be correlated with clinical presentation. Performed at [facility], [address] [place], [place] [address]   I-Stat Troponin, ED (not at [facility])     Status: None   Collection Time: 12/29/18  9:06 PM  Result Value Ref Range   Troponin i, poc 0.00 0.00 - 0.08 ng/mL   Comment 3            Comment: Due to the release kinetics of cTnI, a negative result within the first hours of the onset of symptoms does not rule out myocardial infarction with certainty. If myocardial infarction is still suspected, repeat the test at appropriate intervals.   Sedimentation rate     Status: None   Collection Time: 01/28/19  2:21 PM  Result Value Ref Range   Sed Rate 4 0 - 22 mm/hr    Comment: Performed at [facility], [address], [place], [place] [address]  Vitamin B12     Status: None   Collection Time: 01/28/19  2:22 PM  Result Value Ref Range   Vitamin B-12 403 180 - 914 pg/mL    Comment: (NOTE) This assay is not validated for testing neonatal or myeloproliferative syndrome  specimens for Vitamin B12 levels. Performed at [facility], [address], [place], [place] [address]   TSH     Status: None   Collection Time: 01/28/19  2:22 PM  Result Value Ref Range   TSH 0.850 0.350 - 4.500 uIU/mL    Comment: Performed by a 3rd Generation assay with a functional sensitivity of <=0.01 uIU/mL. Performed at [facility], [address], [place], [place] [address]     RADIOGRAPHIC STUDIES: I have personally reviewed the radiological images as listed and agreed with the findings in the report. Dg Chest 2 View  Result Date: 12/29/2018 CLINICAL DATA:  Chest pain EXAM: CHEST - 2 VIEW COMPARISON:  10/30/2018, chest x-ray 01/29/2017 FINDINGS: The heart size and mediastinal contours are within normal  limits. Both lungs are clear. The visualized skeletal structures are unremarkable. IMPRESSION: No active cardiopulmonary disease. Electronically Signed   By: Jasmine PangKim  Fujinaga M.D.   On: 12/29/2018 17:55   I have reviewed Mathis Budandi Mima Cranmore, NP's note and agree with the documentation.  I personally performed a face-to-face visit, made revisions and my assessment and plan is as follows.  ASSESSMENT & PLAN:  History of multiple miscarriages 1.  Recurrent loss of pregnancies: - She had late first trimester miscarriages x5.  She was apparently positive for lupus anticoagulant 1 time.  She had to take Lovenox 40 mg daily injections throughout her subsequent pregnancy.  She now has twin girls that were born around 26 weeks. - Also complains of diffuse body pains and arthralgias. -No family history of lupus anticoagulant syndrome.  Paternal grandmother reportedly had lupus. -No personal history of thrombosis. - I have recommended checking a lupus anticoagulant, anticardiolipin antibody and anti-beta-2 glycoprotein 1 antibody. - If the lupus anticoagulant testing is positive, it needs to be confirmed with another positive test at least 12 weeks apart.  2.  Neuropathy: -Reportedly developed  neuropathy in the feet approximately a year ago.  She is on gabapentin twice daily. -I have recommended checking a B12, methylmalonic acid, hepatitis panel, ANA, rheumatoid factor, TSH and SPEP.     All questions were answered. The patient knows to call the clinic with any problems, questions or concerns.    Doreatha MassedSreedhar Katragadda, MD 01/28/19 4:46 PM

## 2019-01-28 NOTE — Patient Instructions (Signed)
[facility] at [facility] Discharge Instructions     Thank you for choosing [facility] at [facility] to provide your oncology and hematology care.  To afford each patient quality time with our provider, please arrive at least 15 minutes before your scheduled appointment time.   If you have a lab appointment with the Cancer Center please come in thru the  Main Entrance and check in at the main information desk  You need to re-schedule your appointment should you arrive 10 or more minutes late.  We strive to give you quality time with our providers, and arriving late affects you and other patients whose appointments are after yours.  Also, if you no show three or more times for appointments you may be dismissed from the clinic at the providers discretion.     Again, thank you for choosing [facility].  Our hope is that these requests will decrease the amount of time that you wait before being seen by our physicians.       _____________________________________________________________  Should you have questions after your visit to [facility], please contact our office at [phone] between the hours of 8:00 a.m. and 4:30 p.m.  Voicemails left after 4:00 p.m. will not be returned until the following business day.  For prescription refill requests, have your pharmacy contact our office and allow 72 hours.    Cancer Center Support Programs:   > Cancer Support Group  2nd Tuesday of the month 1pm-2pm, Journey Room    

## 2019-01-29 LAB — LUPUS ANTICOAGULANT PANEL
DRVVT: 42.2 s (ref 0.0–47.0)
PTT LA: 30.6 s (ref 0.0–51.9)

## 2019-01-29 LAB — ANTINUCLEAR ANTIBODIES, IFA: ANA Ab, IFA: NEGATIVE

## 2019-01-29 LAB — HEPATITIS PANEL, ACUTE
HCV Ab: <0.1 s/co ratio (ref 0.0–0.9)
Hep A IgM: NEGATIVE
Hep B C IgM: NEGATIVE
Hepatitis B Surface Ag: NEGATIVE

## 2019-01-29 LAB — IGG, IGA, IGM
IGA: 254 mg/dL (ref 87–352)
IgG (Immunoglobin G), Serum: 1238 mg/dL (ref 700–1600)
IgM (Immunoglobulin M), Srm: 187 mg/dL (ref 26–217)

## 2019-01-29 LAB — RHEUMATOID FACTOR: Rheumatoid fact SerPl-aCnc: <10 IU/mL (ref 0.0–13.9)

## 2019-01-30 ENCOUNTER — Other Ambulatory Visit: Payer: Self-pay | Admitting: Obstetrics & Gynecology

## 2019-01-30 LAB — PROTEIN ELECTROPHORESIS, SERUM
A/G Ratio: 1.6 (ref 0.7–1.7)
ALBUMIN ELP: 4.9 g/dL — AB (ref 2.9–4.4)
Alpha-1-Globulin: 0.2 g/dL (ref 0.0–0.4)
Alpha-2-Globulin: 0.7 g/dL (ref 0.4–1.0)
BETA GLOBULIN: 0.9 g/dL (ref 0.7–1.3)
Gamma Globulin: 1.2 g/dL (ref 0.4–1.8)
Globulin, Total: 3.1 g/dL (ref 2.2–3.9)
Total Protein ELP: 8 g/dL (ref 6.0–8.5)

## 2019-01-30 LAB — BETA-2-GLYCOPROTEIN I ABS, IGG/M/A
Beta-2 Glyco I IgG: <9 GPI IgG units (ref 0–20)
Beta-2-Glycoprotein I IgA: <9 GPI IgA units (ref 0–25)
Beta-2-Glycoprotein I IgM: <9 GPI IgM units (ref 0–32)

## 2019-01-30 LAB — CARDIOLIPIN ANTIBODIES, IGG, IGM, IGA: Anticardiolipin IgA: <9 APL U/mL (ref 0–11)

## 2019-01-30 MED ORDER — GABAPENTIN 300 MG PO CAPS: 300.0000 mg | ORAL_CAPSULE | Freq: Three times a day (TID) | ORAL | 3 refills | Status: DC

## 2019-01-31 LAB — METHYLMALONIC ACID, SERUM: Methylmalonic Acid, Quantitative: 106 nmol/L (ref 0–378)

## 2019-02-19 ENCOUNTER — Inpatient Hospital Stay (HOSPITAL_COMMUNITY): Payer: Commercial Managed Care - PPO | Attending: Hematology | Admitting: Hematology

## 2019-02-19 ENCOUNTER — Other Ambulatory Visit: Payer: Self-pay

## 2019-02-19 ENCOUNTER — Encounter (HOSPITAL_COMMUNITY): Payer: Self-pay | Admitting: Hematology

## 2019-02-19 VITALS — BP 116/74 | HR 74 | Temp 98.4°F | Resp 16 | Wt 137.1 lb

## 2019-02-19 DIAGNOSIS — D6862 Lupus anticoagulant syndrome: Secondary | ICD-10-CM | POA: Insufficient documentation

## 2019-02-19 DIAGNOSIS — Z79899 Other long term (current) drug therapy: Secondary | ICD-10-CM | POA: Diagnosis not present

## 2019-02-19 DIAGNOSIS — F329 Major depressive disorder, single episode, unspecified: Secondary | ICD-10-CM | POA: Insufficient documentation

## 2019-02-19 DIAGNOSIS — F419 Anxiety disorder, unspecified: Secondary | ICD-10-CM | POA: Insufficient documentation

## 2019-02-19 DIAGNOSIS — O99119 Other diseases of the blood and blood-forming organs and certain disorders involving the immune mechanism complicating pregnancy, unspecified trimester: Secondary | ICD-10-CM

## 2019-02-19 DIAGNOSIS — Z793 Long term (current) use of hormonal contraceptives: Secondary | ICD-10-CM | POA: Insufficient documentation

## 2019-02-19 DIAGNOSIS — N96 Recurrent pregnancy loss: Secondary | ICD-10-CM | POA: Insufficient documentation

## 2019-02-19 MED ORDER — FLUOXETINE HCL 10 MG PO CAPS: 10.0000 mg | ORAL_CAPSULE | Freq: Every day | ORAL | 3 refills | Status: DC

## 2019-02-19 NOTE — Patient Instructions (Signed)
[facility] at [facility] Discharge Instructions  Follow u pin 4 months with labs prior    Thank you for choosing [facility] at [facility] to provide your oncology and hematology care.  To afford each patient quality time with our provider, please arrive at least 15 minutes before your scheduled appointment time.   If you have a lab appointment with the Cancer Center please come in thru the  Main Entrance and check in at the main information desk  You need to re-schedule your appointment should you arrive 10 or more minutes late.  We strive to give you quality time with our providers, and arriving late affects you and other patients whose appointments are after yours.  Also, if you no show three or more times for appointments you may be dismissed from the clinic at the providers discretion.     Again, thank you for choosing [facility].  Our hope is that these requests will decrease the amount of time that you wait before being seen by our physicians.       _____________________________________________________________  Should you have questions after your visit to  Cancer Center, please contact our office at [phone] between the hours of 8:00 a.m. and 4:30 p.m.  Voicemails left after 4:00 p.m. will not be returned until the following business day.  For prescription refill requests, have your pharmacy contact our office and allow 72 hours.    Cancer Center Support Programs:   > Cancer Support Group  2nd Tuesday of the month 1pm-2pm, Journey Room    

## 2019-02-19 NOTE — Progress Notes (Signed)
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   CURRENT THERAPY: observation   INTERVAL HISTORY:  [name] 24 y.o. 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 She is here today and still experiencing generalized body pains and severe fatigue. Denies any nausea, vomiting, or diarrhea. Denies any new pains. Had not noticed any recent bleeding such as epistaxis, hematuria or hematochezia. Denies recent chest pain on exertion, shortness of breath on minimal exertion, pre-syncopal episodes, or palpitations. Denies any numbness or tingling in hands or feet. Denies any recent fevers, infections, or recent hospitalizations. Patient reports appetite at 75% and energy level at 50%. She is eating well and maintaining her weight.    REVIEW OF SYSTEMS:  Review of Systems  Constitutional: Positive for fatigue.  Musculoskeletal: Positive for myalgias.  All other systems reviewed and are negative.    PAST MEDICAL/SURGICAL HISTORY:  Past Medical History:  Diagnosis Date  . Allergy   . Anxiety and depression   . Asthma   . Birth defect   . Blood transfusion without reported diagnosis   . Clotting disorder ([place])   . Complication of anesthesia    'I had stridor after my 1st D&C but with my 2nd one they gave me a breathing treatment before my surgery and I did ok".  . Cyst of left kidney    frequent cysts to kidneys  . Dysrhythmia    h/o SVT  . Family history of adverse reaction to anesthesia   . GERD (gastroesophageal reflux disease)   . Headache   . History of recurrent miscarriages   . Hypoglycemia   . Lupus anticoagulant positive   . Neuropathic pain   . Palpitations    Past Surgical History:  Procedure Laterality Date  . CESAREAN SECTION    .  CHOLECYSTECTOMY N/A 07/23/2018   Procedure: LAPAROSCOPIC CHOLECYSTECTOMY;  Surgeon: Virl Cagey, MD;  Location: [facility];  Service: General;  Laterality: N/A;  . DILATION AND CURETTAGE OF UTERUS N/A 01/29/2017   Procedure: SUCTION DILATATION AND CURETTAGE;  Surgeon: Jonnie Kind, MD;  Location: [facility];  Service: Gynecology;  Laterality: N/A;  . DILATION AND CURETTAGE OF UTERUS N/A 05/01/2017   Procedure: SUCTION DILATATION AND CURETTAGE;  Surgeon: Florian Buff, MD;  Location: [facility];  Service: Gynecology;  Laterality: N/A;  pt to arrive at 7:15 to have labwork  . FETOSCOPIC LASER PHOTOCOAGULATION  11/14/2017   of 34 placental vessels for TTTS  . WISDOM TOOTH EXTRACTION       SOCIAL HISTORY:  Social History   Socioeconomic History  . Marital status: Single    Spouse name: Not on file  . Number of children: Not on file  . Years of education: Not on file  . Highest education level: Not on file  Occupational History  . Not on file  Social Needs  . Financial resource strain: Not on file  . Food insecurity:    Worry: Not on file    Inability: Not on file  . Transportation needs:    Medical: Not on file    Non-medical: Not on file  Tobacco Use  . Smoking status: Never Smoker  . Smokeless tobacco: Never Used  Substance and Sexual Activity  . Alcohol use: No    Alcohol/week: 0.0 standard  drinks  . Drug use: No  . Sexual activity: Not Currently    Birth control/protection: None  Lifestyle  . Physical activity:    Days per week: Not on file    Minutes per session: Not on file  . Stress: Not on file  Relationships  . Social connections:    Talks on phone: Not on file    Gets together: Not on file    Attends religious service: Not on file    Active member of club or organization: Not on file    Attends meetings of clubs or organizations: Not on file    Relationship status: Not on file  . Intimate partner violence:    Fear of current or ex partner: Not on file     Emotionally abused: Not on file    Physically abused: Not on file    Forced sexual activity: Not on file  Other Topics Concern  . Not on file  Social History Narrative  . Not on file    FAMILY HISTORY:  Family History  Adopted: Yes  Problem Relation Age of Onset  . Migraines Sister   . Hypertension Maternal Grandmother   . Skin cancer Maternal Grandmother   . Heart disease Maternal Grandfather     CURRENT MEDICATIONS:  Outpatient Encounter Medications as of 02/19/2019  Medication Sig  . albuterol (VENTOLIN HFA) 108 (90 Base) MCG/ACT inhaler Inhale 2 puffs into the lungs every 6 (six) hours as needed for wheezing or shortness of breath.  . docusate sodium (COLACE) 100 MG capsule Take 1 capsule (100 mg total) by mouth 2 (two) times daily as needed for mild constipation.  . enoxaparin (LOVENOX) 40 MG/0.4ML injection Inject 0.4 mLs (40 mg total) into the skin daily.  Marland Kitchen gabapentin (NEURONTIN) 300 MG capsule gabapentin 300 mg capsule  . gabapentin (NEURONTIN) 300 MG capsule Take 1 capsule (300 mg total) by mouth 3 (three) times daily.  Marland Kitchen lidocaine (LIDODERM) 5 % Lidoderm 5 % topical patch  APPLY 1 PATCH BY TRANSDERMAL ROUTE ONCE DAILY (MAY WEAR UP TO 12HOURS.)  . LORazepam (ATIVAN) 1 MG tablet Take 0.5-1 tablets (0.5-1 mg total) by mouth every 8 (eight) hours as needed for anxiety.  . medroxyPROGESTERone (DEPO-PROVERA) 150 MG/ML injection Inject 1 mL (150 mg total) into the muscle every 3 (three) months.  . promethazine (PHENERGAN) 25 MG suppository Place 1 suppository (25 mg total) rectally every 6 (six) hours as needed for nausea or vomiting.  . topiramate (TOPAMAX) 100 MG tablet Take 100 mg by mouth at bedtime.  Marland Kitchen zolpidem (AMBIEN) 5 MG tablet Take 1 tablet (5 mg total) by mouth at bedtime as needed for sleep.  Marland Kitchen FLUoxetine (PROZAC) 10 MG capsule Take 1 capsule (10 mg total) by mouth daily.   No facility-administered encounter medications on file as of 02/19/2019.     ALLERGIES:    Allergies  Allergen Reactions  . Pecan Nut (Diagnostic) Hives  . Reglan [Metoclopramide] Hives     PHYSICAL EXAM:  ECOG Performance status: 1  Vitals:   02/19/19 0800  BP: 116/74  Pulse: 74  Resp: 16  Temp: 98.4 F (36.9 C)  SpO2: 100%   Filed Weights   02/19/19 0800  Weight: 137 lb 2 oz (62.2 kg)    Physical Exam Constitutional:      Appearance: Normal appearance. She is normal weight.  Musculoskeletal: Normal range of motion.  Skin:    General: Skin is warm and dry.  Neurological:     Mental  Status: She is alert and oriented to person, place, and time. Mental status is at baseline.  Psychiatric:        Mood and Affect: Mood normal.        Behavior: Behavior normal.        Thought Content: Thought content normal.        Judgment: Judgment normal.      LABORATORY DATA:  I have reviewed the labs as listed.  CBC    Component Value Date/Time   WBC 6.2 12/29/2018 1707   RBC 4.55 12/29/2018 1707   HGB 13.0 12/29/2018 1707   HGB 12.1 04/28/2018 1611   HCT 38.3 12/29/2018 1707   HCT 35.5 04/28/2018 1611   PLT 222 12/29/2018 1707   PLT 187 04/28/2018 1611   MCV 84.2 12/29/2018 1707   MCV 83 04/28/2018 1611   MCH 28.6 12/29/2018 1707   MCHC 33.9 12/29/2018 1707   RDW 12.1 12/29/2018 1707   RDW 14.0 04/28/2018 1611   LYMPHSABS 1.7 09/26/2018 1600   LYMPHSABS 1.4 04/28/2018 1611   MONOABS 0.3 09/26/2018 1600   EOSABS 0.1 09/26/2018 1600   EOSABS 0.1 04/28/2018 1611   BASOSABS 0.0 09/26/2018 1600   BASOSABS 0.0 04/28/2018 1611   CMP Latest Ref Rng & Units 12/29/2018 09/26/2018 07/22/2018  Glucose 70 - 99 mg/dL 104(H) 98 82  BUN 6 - 20 mg/dL '11 14 13  ' Creatinine 0.44 - 1.00 mg/dL 0.70 0.61 0.63  Sodium 135 - 145 mmol/L 139 141 139  Potassium 3.5 - 5.1 mmol/L 3.7 3.9 4.0  Chloride 98 - 111 mmol/L 103 107 107  CO2 22 - 32 mmol/L '27 30 28  ' Calcium 8.9 - 10.3 mg/dL 9.1 9.7 9.1  Total Protein 6.5 - 8.1 g/dL - 7.5 -  Total Bilirubin 0.3 - 1.2 mg/dL - 1.2 -   Alkaline Phos 38 - 126 U/L - 54 -  AST 15 - 41 U/L - 18 -  ALT 0 - 44 U/L - 17 -       DIAGNOSTIC IMAGING:  I have independently reviewed the scans and discussed with the patient.   I have reviewed Francene Finders, NP's note and agree with the documentation.  I personally performed a face-to-face visit, made revisions and my assessment and plan is as follows.    ASSESSMENT & PLAN:   History of multiple miscarriages 1.  Recurrent loss of pregnancies: - She had late first trimester miscarriages x5.  She was apparently positive for lupus anticoagulant 1 time.  She had to take Lovenox 40 mg daily injections throughout her subsequent pregnancy.  She now has twin girls that were born around 53 weeks. - No family history of lupus anticoagulant syndrome.  Paternal grandmother reportedly had lupus.  No personal history of thrombosis. -We discussed the results of blood work.  This was done on 01/28/2019.  No lupus anticoagulant was seen.  Anticardiolipin and anti-beta-2 glycoprotein 1 antibodies were negative.   - She complains of diffuse body pains.  I think she is depressed.  We will give a trial of antidepressant.  We talked about starting her on Prozac 20 mg daily and discussed side effects in detail.  We will send it to her pharmacy.  I will reevaluate her in 4 months.  2.  Neuropathy: -She developed neuropathy in the feet approximately a year ago.  She is taking gabapentin twice daily. -We checked her B12 level which was within normal limits.  Hepatitis panel was negative.  ESR was normal.  ANA and rheumatoid factor were negative.  TSH and SPEP was also negative.       Orders placed this encounter:  Orders Placed This Encounter  Procedures  . Lupus anticoagulant panel  . Ferritin  . Iron and TIBC      Derek Jack, MD [place] [phone]

## 2019-02-19 NOTE — Assessment & Plan Note (Signed)
1.  Recurrent loss of pregnancies: - She had late first trimester miscarriages x5.  She was apparently positive for lupus anticoagulant 1 time.  She had to take Lovenox 40 mg daily injections throughout her subsequent pregnancy.  She now has twin girls that were born around 92 weeks. - No family history of lupus anticoagulant syndrome.  Paternal grandmother reportedly had lupus.  No personal history of thrombosis. -We discussed the results of blood work.  This was done on 01/28/2019.  No lupus anticoagulant was seen.  Anticardiolipin and anti-beta-2 glycoprotein 1 antibodies were negative.   - She complains of diffuse body pains.  I think she is depressed.  We will give a trial of antidepressant.  We talked about starting her on Prozac 20 mg daily and discussed side effects in detail.  We will send it to her pharmacy.  I will reevaluate her in 4 months.  2.  Neuropathy: -She developed neuropathy in the feet approximately a year ago.  She is taking gabapentin twice daily. -We checked her B12 level which was within normal limits.  Hepatitis panel was negative.  ESR was normal.  ANA and rheumatoid factor were negative.  TSH and SPEP was also negative.

## 2019-02-23 ENCOUNTER — Telehealth: Payer: Self-pay | Admitting: *Deleted

## 2019-02-23 ENCOUNTER — Encounter: Payer: Self-pay | Admitting: *Deleted

## 2019-03-04 NOTE — Telephone Encounter (Signed)
Note sent to nurse. 

## 2019-03-05 ENCOUNTER — Ambulatory Visit: Payer: Commercial Managed Care - PPO | Admitting: Women's Health

## 2019-03-11 ENCOUNTER — Ambulatory Visit: Payer: Self-pay | Admitting: Obstetrics and Gynecology

## 2019-03-15 ENCOUNTER — Other Ambulatory Visit: Payer: Self-pay | Admitting: Obstetrics & Gynecology

## 2019-03-15 ENCOUNTER — Other Ambulatory Visit (HOSPITAL_COMMUNITY): Payer: Self-pay | Admitting: Nurse Practitioner

## 2019-03-15 DIAGNOSIS — O99119 Other diseases of the blood and blood-forming organs and certain disorders involving the immune mechanism complicating pregnancy, unspecified trimester: Principal | ICD-10-CM

## 2019-03-15 DIAGNOSIS — D6862 Lupus anticoagulant syndrome: Secondary | ICD-10-CM

## 2019-04-01 ENCOUNTER — Emergency Department (HOSPITAL_COMMUNITY): Payer: Commercial Managed Care - PPO

## 2019-04-01 ENCOUNTER — Encounter (HOSPITAL_COMMUNITY): Payer: Self-pay

## 2019-04-01 ENCOUNTER — Emergency Department (HOSPITAL_COMMUNITY)
Admission: EM | Admit: 2019-04-01 | Discharge: 2019-04-01 | Disposition: A | Discharge status: Home / Self Care | Payer: Commercial Managed Care - PPO | Attending: Emergency Medicine | Admitting: Emergency Medicine

## 2019-04-01 DIAGNOSIS — J45909 Unspecified asthma, uncomplicated: Secondary | ICD-10-CM | POA: Diagnosis not present

## 2019-04-01 DIAGNOSIS — R109 Unspecified abdominal pain: Secondary | ICD-10-CM | POA: Diagnosis present

## 2019-04-01 DIAGNOSIS — Z79899 Other long term (current) drug therapy: Secondary | ICD-10-CM | POA: Diagnosis not present

## 2019-04-01 LAB — CBC WITH DIFFERENTIAL/PLATELET
Abs Immature Granulocytes: 0 10*3/uL (ref 0.00–0.07)
Basophils Absolute: 0.1 10*3/uL (ref 0.0–0.1)
Basophils Relative: 1 %
Eosinophils Absolute: 0 10*3/uL (ref 0.0–0.5)
Eosinophils Relative: 0 %
HCT: 37.5 % (ref 36.0–46.0)
Hemoglobin: 12.7 g/dL (ref 12.0–15.0)
Lymphocytes Relative: 31 %
Lymphs Abs: 1.8 10*3/uL (ref 0.7–4.0)
MCH: 28.6 pg (ref 26.0–34.0)
MCHC: 33.9 g/dL (ref 30.0–36.0)
MCV: 84.5 fL (ref 80.0–100.0)
Monocytes Absolute: 0.4 10*3/uL (ref 0.1–1.0)
Monocytes Relative: 7 %
Neutro Abs: 3.5 10*3/uL (ref 1.7–7.7)
Neutrophils Relative %: 61 %
Platelets: 158 10*3/uL (ref 150–400)
RBC: 4.44 MIL/uL (ref 3.87–5.11)
RDW: 13.2 % (ref 11.5–15.5)
WBC: 5.7 10*3/uL (ref 4.0–10.5)
nRBC: 0 % (ref 0.0–0.2)
nRBC: 0 /100 WBC

## 2019-04-01 LAB — COMPREHENSIVE METABOLIC PANEL
ALT: 24 U/L (ref 0–44)
AST: 31 U/L (ref 15–41)
Albumin: 4.2 g/dL (ref 3.5–5.0)
Alkaline Phosphatase: 54 U/L (ref 38–126)
Anion gap: 10 (ref 5–15)
BUN: 7 mg/dL (ref 6–20)
CO2: 22 mmol/L (ref 22–32)
Calcium: 9 mg/dL (ref 8.9–10.3)
Chloride: 105 mmol/L (ref 98–111)
Creatinine, Ser: 0.74 mg/dL (ref 0.44–1.00)
GFR calc Af Amer: >60 mL/min (ref >60)
GFR calc non Af Amer: >60 mL/min (ref >60)
Glucose, Bld: 101 mg/dL — ABNORMAL HIGH (ref 70–99)
Potassium: 3.3 mmol/L — ABNORMAL LOW (ref 3.5–5.1)
Sodium: 137 mmol/L (ref 135–145)
Total Bilirubin: 2.7 mg/dL — ABNORMAL HIGH (ref 0.3–1.2)
Total Protein: 7.5 g/dL (ref 6.5–8.1)

## 2019-04-01 LAB — URINALYSIS, ROUTINE W REFLEX MICROSCOPIC
Bilirubin Urine: NEGATIVE
Glucose, UA: NEGATIVE mg/dL
Hgb urine dipstick: NEGATIVE
Ketones, ur: NEGATIVE mg/dL
Leukocytes,Ua: NEGATIVE
Nitrite: NEGATIVE
Protein, ur: NEGATIVE mg/dL
Specific Gravity, Urine: 1.021 (ref 1.005–1.030)
pH: 6 (ref 5.0–8.0)

## 2019-04-01 LAB — ACETAMINOPHEN LEVEL: Acetaminophen (Tylenol), Serum: <10 ug/mL — ABNORMAL LOW (ref 10–30)

## 2019-04-01 LAB — I-STAT BETA HCG BLOOD, ED (MC, WL, AP ONLY): I-stat hCG, quantitative: <5 m[IU]/mL (ref <5)

## 2019-04-01 LAB — CK TOTAL AND CKMB (NOT AT ARMC)
CK, MB: 0.3 ng/mL — ABNORMAL LOW (ref 0.5–5.0)
Relative Index: INVALID (ref 0.0–2.5)
Total CK: 50 U/L (ref 38–234)

## 2019-04-01 MED ORDER — ONDANSETRON HCL 4 MG/2ML IJ SOLN
4.0000 mg | Freq: Once | INTRAMUSCULAR | Status: AC
  Administered 2019-04-01: 22:00:00 4 mg via INTRAVENOUS
  Filled 2019-04-01: qty 2

## 2019-04-01 MED ORDER — POTASSIUM CHLORIDE CRYS ER 20 MEQ PO TBCR
20.0000 meq | EXTENDED_RELEASE_TABLET | Freq: Once | ORAL | Status: AC
  Administered 2019-04-01: 20:00:00 20 meq via ORAL
  Filled 2019-04-01: qty 1

## 2019-04-01 MED ORDER — SODIUM CHLORIDE 0.9 % IV BOLUS
1000.0000 mL | Freq: Once | INTRAVENOUS | Status: AC
  Administered 2019-04-01: 1000 mL via INTRAVENOUS

## 2019-04-01 MED ORDER — FENTANYL CITRATE (PF) 100 MCG/2ML IJ SOLN
100.0000 ug | Freq: Once | INTRAMUSCULAR | Status: AC
  Administered 2019-04-01: 22:00:00 100 ug via INTRAVENOUS
  Filled 2019-04-01: qty 2

## 2019-04-01 MED ORDER — IOHEXOL 300 MG/ML  SOLN
100.0000 mL | Freq: Once | INTRAMUSCULAR | Status: AC | PRN
  Administered 2019-04-01: 100 mL via INTRAVENOUS

## 2019-04-01 NOTE — ED Notes (Signed)
Patient verbalizes understanding of discharge instructions. Opportunity for questioning and answers were provided. Armband removed by staff, pt discharged from ED. Pt wheeled to lobby and taken home by family 

## 2019-04-01 NOTE — ED Triage Notes (Signed)
Pt arrives POV per PCP, pt has had abdominal discomfort, loose stools, and yellowing of her sclera since yesterday. Pt had labs done by PCP which showed elevated bilirubin. Pt alert, oriented, and ambulatory.

## 2019-04-01 NOTE — Discharge Instructions (Signed)
As we discussed, today your bilirubin was elevated.  Your rest of your blood work and your CT scan were reassuring.  You will follow-up with the referred GI doctor as directed.  As we discussed, you have a hepatitis panel pending.  If it is positive, they will contact you.  You should avoid any Tylenol or alcohol.  Return the emergency department for any high fever, persistent vomiting, worsening abdominal pain or any other worsening or concerning symptoms.

## 2019-04-01 NOTE — ED Notes (Signed)
Pt confirmed she was able to keep down fluids. EDP notified.

## 2019-04-01 NOTE — ED Provider Notes (Signed)
[facility] EMERGENCY DEPARTMENT Provider Note   CSN: [id number] Arrival date & time: 04/01/19  1718    History   Chief Complaint Chief Complaint  Patient presents with  . Abdominal Pain  . Abnormal Lab    HPI Carol Bernard is a 24 y.o. female past medical history of antiphospholipid syndrome, GERD, lupus anticoagulate positive, SVT for evaluation of abnormal lab, abdominal discomfort.  Patient states yesterday, she noticed that her bilateral eyes were starting to appear yellow.  She states she had been having some generalized abdominal discomfort.  She did a telemedicine visit and had blood work and urine done.  She reports that yesterday when she did the urine, her urine was dark brown.  Patient states that she was called by the doctor today and told that her bilirubin was elevated and to go to the emergency department.  Patient states that she has had nausea and decreased appetite but has not had any vomiting.  She states that she has been taking Tylenol for the last 2 days for abdominal pain.  She reports she has been taking 1000mg /day.  She has not been taking any other medications and denies any new medications.  Patient states that she has not had any recent travel and is not an IV drug user.  Patient states that she has not noted any fevers, chest pain, difficulty breathing, dysuria.  Patient reports she has had her gallbladder taken out.      The history is provided by the patient.    Past Medical History:  Diagnosis Date  . Allergy   . Anxiety and depression   . Asthma   . Birth defect   . Blood transfusion without reported diagnosis   . Clotting disorder (HCC)   . Complication of anesthesia    'I had stridor after my 1st D&C but with my 2nd one they gave me a breathing treatment before my surgery and I did ok".  . Cyst of left kidney    frequent cysts to kidneys  . Dysrhythmia    h/o SVT  . Family history of adverse reaction to anesthesia   . GERD  (gastroesophageal reflux disease)   . Headache   . History of recurrent miscarriages   . Hypoglycemia   . Lupus anticoagulant positive   . Neuropathic pain   . Palpitations     Patient Active Problem List   Diagnosis Date Noted  . Short interval between pregnancies affecting pregnancy, antepartum 04/28/2018  . Lupus anticoagulant affecting pregnancy, antepartum (HCC) 04/28/2018  . Previous cesarean section 04/28/2018  . Gestational thrombocytopenia (HCC) 08/17/2017  . 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 CESAREAN SECTION    . CHOLECYSTECTOMY N/A 07/23/2018   Procedure: LAPAROSCOPIC CHOLECYSTECTOMY;  Surgeon: Lucretia Roers, MD;  Location: [facility];  Service: General;  Laterality: N/A;  . DILATION AND CURETTAGE OF UTERUS N/A 01/29/2017   Procedure: SUCTION DILATATION AND CURETTAGE;  Surgeon: Tilda Burrow, MD;  Location: [facility];  Service: Gynecology;  Laterality: N/A;  . DILATION AND CURETTAGE OF UTERUS N/A 05/01/2017   Procedure: SUCTION DILATATION AND CURETTAGE;  Surgeon: Lazaro Arms, MD;  Location: [facility];  Service: Gynecology;  Laterality: N/A;  pt to arrive at 7:15 to have labwork  . FETOSCOPIC LASER PHOTOCOAGULATION  11/14/2017   of 34 placental vessels for TTTS  . WISDOM TOOTH EXTRACTION       OB History  Gravida  7   Para  1   Term      Preterm  1   AB  5   Living  2     SAB  4   TAB      Ectopic      Multiple  1   Live Births  2            Home Medications    Prior to Admission medications   Medication Sig Start Date End Date Taking? Authorizing Provider  acetaminophen (TYLENOL) 500 MG tablet Take 500 mg by mouth every 6 (six) hours as needed for moderate pain, fever or headache.   Yes [provider]  albuterol (VENTOLIN HFA) 108 (90 Base) MCG/ACT inhaler Inhale 2 puffs into the lungs every 6 (six) hours as needed for wheezing or shortness of breath. 07/23/18  Yes  Lucretia Roers, MD  FLUoxetine (PROZAC) 10 MG capsule TAKE 1 CAPSULE BY MOUTH EVERY DAY Patient taking differently: Take 10 mg by mouth daily.  03/18/19  Yes Lockamy, Randi L, NP-C  gabapentin (NEURONTIN) 300 MG capsule TAKE 1 CAPSULE BY MOUTH THREE TIMES A DAY Patient taking differently: Take 300 mg by mouth 3 (three) times daily.  03/15/19  Yes Lazaro Arms, MD  lidocaine (LIDODERM) 5 % Place 1 patch onto the skin daily as needed (back pain). May wear up to 12 hours   Yes [provider]  LORazepam (ATIVAN) 1 MG tablet Take 0.5-1 tablets (0.5-1 mg total) by mouth every 8 (eight) hours as needed for anxiety. 10/17/18 10/17/19 Yes Lazaro Arms, MD  medroxyPROGESTERone (DEPO-PROVERA) 150 MG/ML injection Inject 1 mL (150 mg total) into the muscle every 3 (three) months. 09/02/18  Yes Lazaro Arms, MD  topiramate (TOPAMAX) 100 MG tablet Take 100 mg by mouth at bedtime.   Yes [provider]  zolpidem (AMBIEN) 5 MG tablet Take 1 tablet (5 mg total) by mouth at bedtime as needed for sleep. 01/16/19 04/16/19 Yes Lazaro Arms, MD  docusate sodium (COLACE) 100 MG capsule Take 1 capsule (100 mg total) by mouth 2 (two) times daily as needed for mild constipation. Patient not taking: Reported on 04/01/2019 07/23/18 07/23/19  Lucretia Roers, MD  enoxaparin (LOVENOX) 40 MG/0.4ML injection Inject 0.4 mLs (40 mg total) into the skin daily. Patient not taking: Reported on 04/01/2019 09/08/18   Lazaro Arms, MD  promethazine (PHENERGAN) 25 MG suppository Place 1 suppository (25 mg total) rectally every 6 (six) hours as needed for nausea or vomiting. Patient not taking: Reported on 04/01/2019 07/31/18   Lucretia Roers, MD    Family History Family History  Adopted: Yes  Problem Relation Age of Onset  . Migraines Sister   . Hypertension Maternal Grandmother   . Skin cancer Maternal Grandmother   . Heart disease Maternal Grandfather     Social History Social History   Tobacco Use  .  Smoking status: Never Smoker  . Smokeless tobacco: Never Used  Substance Use Topics  . Alcohol use: No    Alcohol/week: 0.0 standard drinks  . Drug use: No     Allergies   Pecan nut (diagnostic) and Reglan [metoclopramide]   Review of Systems Review of Systems  Constitutional: Positive for appetite change. Negative for fever.  Respiratory: Negative for cough and shortness of breath.   Cardiovascular: Negative for chest pain.  Gastrointestinal: Positive for abdominal pain and nausea. Negative for vomiting.  Genitourinary: Positive for hematuria. Negative for  dysuria.  Neurological: Negative for headaches.  All other systems reviewed and are negative.    Physical Exam Updated Vital Signs BP 117/82   Pulse 89   Temp 98.4 F (36.9 C) (Oral)   Resp 16   Ht  (1.626 m)   SpO2 96%   BMI 23.54 kg/m   Physical Exam Vitals signs and nursing note reviewed.  Constitutional:      Appearance: Normal appearance. She is well-developed.  HENT:     Head: Normocephalic and atraumatic.     Mouth/Throat:     Comments: No jaundice noted to floor of mouth. Eyes:     General: Lids are normal. Scleral icterus present.     Conjunctiva/sclera: Conjunctivae normal.     Pupils: Pupils are equal, round, and reactive to light.     Comments: Mild scleral icterus bilaterally.  Neck:     Musculoskeletal: Full passive range of motion without pain.  Cardiovascular:     Rate and Rhythm: Normal rate and regular rhythm.     Pulses: Normal pulses.     Heart sounds: Normal heart sounds. No murmur. No friction rub. No gallop.   Pulmonary:     Effort: Pulmonary effort is normal.     Breath sounds: Normal breath sounds.     Comments: Lungs clear to auscultation bilaterally.  Symmetric chest rise.  No wheezing, rales, rhonchi. Abdominal:     Palpations: Abdomen is soft. Abdomen is not rigid.     Tenderness: There is abdominal tenderness. There is no rebound.     Comments: Abdomen is soft,  non-distended. Diffuse tenderness noted.  No rigidity, No guarding. No peritoneal signs.  Musculoskeletal: Normal range of motion.  Skin:    General: Skin is warm and dry.     Capillary Refill: Capillary refill takes less than 2 seconds.  Neurological:     Mental Status: She is alert and oriented to person, place, and time.  Psychiatric:        Speech: Speech normal.      ED Treatments / Results  Labs (all labs ordered are listed, but only abnormal results are displayed) Labs Reviewed  ACETAMINOPHEN LEVEL - Abnormal; Notable for the following components:      Result Value   Acetaminophen (Tylenol), Serum <10 (*)    All other components within normal limits  COMPREHENSIVE METABOLIC PANEL - Abnormal; Notable for the following components:   Potassium 3.3 (*)    Glucose, Bld 101 (*)    Total Bilirubin 2.7 (*)    All other components within normal limits  URINALYSIS, ROUTINE W REFLEX MICROSCOPIC - Abnormal; Notable for the following components:   APPearance HAZY (*)    All other components within normal limits  CK TOTAL AND CKMB (NOT AT [facility]) - Abnormal; Notable for the following components:   CK, MB 0.3 (*)    All other components within normal limits  CBC WITH DIFFERENTIAL/PLATELET  HEPATITIS PANEL, ACUTE  I-STAT BETA HCG BLOOD, ED ([facility], [facility], [facility] ONLY)    EKG EKG Interpretation  Date/Time:  Wednesday April 01 2019 17:31:19 EDT Ventricular Rate:  110 PR Interval:    QRS Duration: 51 QT Interval:  375 QTC Calculation: 508 R Axis:   71 Text Interpretation:  Sinus tachycardia Borderline T abnormalities, diffuse leads Prolonged QT interval similar to prior 12/19 Confirmed by Meridee Score [phone]) on 04/01/2019 5:35:50 PM   Radiology Ct Abdomen Pelvis W Contrast  Result Date: 04/01/2019 CLINICAL DATA:  24 year old female with generalized  abdominal pain. EXAM: CT ABDOMEN AND PELVIS WITH CONTRAST TECHNIQUE: Multidetector CT imaging of the abdomen and pelvis was performed using  the standard protocol following bolus administration of intravenous contrast. CONTRAST:  OMNIPAQUE IOHEXOL 300 MG/ML  SOLN COMPARISON:  Abdominal ultrasound dated 02/12/2011 FINDINGS: Lower chest: The visualized lung bases are clear. No intra-abdominal free air. Probable trace free fluid within the pelvis. Hepatobiliary: The liver is unremarkable. No intrahepatic biliary ductal dilatation. Cholecystectomy. Pancreas: Unremarkable. No pancreatic ductal dilatation or surrounding inflammatory changes. Spleen: Top-normal spleen size measuring up to 12 cm in length. Adrenals/Urinary Tract: Adrenal glands are unremarkable. Kidneys are normal, without renal calculi, focal lesion, or hydronephrosis. Bladder is unremarkable. Stomach/Bowel: Diffuse thickened appearance of the colon, likely related to underdistention. Colitis is less likely. Clinical correlation is recommended. There is no bowel obstruction. The appendix is normal. Vascular/Lymphatic: The abdominal aorta and IVC are unremarkable. No portal venous gas. There is no adenopathy. Reproductive: The uterus is retroverted and appears unremarkable. The ovaries are grossly unremarkable as visualized. Multiple right ovarian follicles noted. Other: None Musculoskeletal: No acute or significant osseous findings. IMPRESSION: 1. Underdistention of the colon versus less likely colitis. Clinical correlation is recommended. No bowel obstruction. Normal appendix. 2. Top-normal spleen size. Electronically Signed   By: Elgie Collard M.D.   On: 04/01/2019 20:17    Procedures Procedures (including critical care time)  Medications Ordered in ED Medications  sodium chloride 0.9 % bolus 1,000 mL (0 mLs Intravenous Stopped 04/01/19 1947)  potassium chloride SA (K-DUR,KLOR-CON) CR tablet 20 mEq (20 mEq Oral Given 04/01/19 1947)  iohexol (OMNIPAQUE) 300 MG/ML solution 100 mL (100 mLs Intravenous Contrast Given 04/01/19 2000)  fentaNYL (SUBLIMAZE) injection 100 mcg (100 mcg  Intravenous Given 04/01/19 2204)  ondansetron (ZOFRAN) injection 4 mg (4 mg Intravenous Given 04/01/19 2201)     Initial Impression / Assessment and Plan / ED Course  I have reviewed the triage vital signs and the nursing notes.  Pertinent labs & imaging results that were available during my care of the patient were reviewed by me and considered in my medical decision making (see chart for details).  Clinical Course as of Mar 31 2333  Wed Apr 01, 2019  [id number] 24 year old female sent in by her primary care doctor for evaluation of scleral icterus.  She said she woke up with it this morning and has noticed a little bit of lightheadedness and some vague right-sided abdominal discomfort.  She has no particular risk factors for jaundice.  She has had her gallbladder out already.  She is otherwise nontoxic-appearing.  She is getting some lab work as we do not see anything that her PCP had ordered in our system.  She otherwise looks well so it is potential that she would be discharged with outpatient follow-up if no significant findings are identified.   [MB]    Clinical Course User Index [MB] Terrilee Files, MD       24 year old female sent in for evaluation of abnormal lab.  Patient states she noticed some scleral icterus and had an appointment with tele-med.  They did blood work and noted that her bilirubin was elevated at 2.7.  Patient states that she has had some generalized abdominal pain, nausea/decreased appetite.  Patient also states that she has had some dark urine.  Patient denies any fevers, vomiting.  On initial ED arrival, she is tachycardic but otherwise vitals stable.  We will plan to check labs.  CBC shows no evidence of leukocytosis.  Hemoglobin/hematocrit within normal limits.  I-STAT beta negative.  CK is normal.  CMP shows potassium of 3.3.  Total bili is 2.7.  Will plan for CT on pelvis.  CT on pelvis shows no hepatic biliary abnormality.  Previous cholecystectomy noted.  No  intrahepatic biliary ductal dilatation.  There is underdistention of the colon versus less likely colitis.  No bowel obstruction.  Normal appendix.  Discussed with Dr. Russella Dar (GI).  When reassuring AST, ALT as well as CT scan, no indication for further intervention here in ED.  He recommends outpatient discharge and follow-up with GI for further evaluation. Appreciate his consult.   Updated patient on plan.  She is agreeable.  Patient reports improvement in abdominal pain since being here in the ED.  She has not had any nausea/vomiting.  We will plan to p.o. challenge patient.  Patient able tolerate p.o. without any difficulty.  Repeat abdominal exam is improved.  Patient states she feels comfortable going home.  Hepatitis panel pending.  Instructed her to follow-up with GI for further evaluation. At this time, patient exhibits no emergent life-threatening condition that require further evaluation in ED or admission. Patient had ample opportunity for questions and discussion. All patient's questions were answered with full understanding. Strict return precautions discussed. Patient expresses understanding and agreement to plan.   Portions of this note were generated with Scientist, clinical (histocompatibility and immunogenetics). Dictation errors may occur despite best attempts at proofreading.   Final Clinical Impressions(s) / ED Diagnoses   Final diagnoses:  Hyperbilirubinemia    ED Discharge Orders    None       Rosana Hoes 04/01/19 2335    Terrilee Files, MD 04/02/19 [phone]

## 2019-04-01 NOTE — ED Notes (Addendum)
Pt given PO fluids at this time 

## 2019-04-02 ENCOUNTER — Telehealth: Payer: Self-pay | Admitting: Gastroenterology

## 2019-04-02 LAB — HEPATITIS PANEL, ACUTE
HCV Ab: <0.1 s/co ratio (ref 0.0–0.9)
Hep A IgM: NEGATIVE
Hep B C IgM: NEGATIVE
Hepatitis B Surface Ag: NEGATIVE

## 2019-04-02 NOTE — Telephone Encounter (Signed)
Pt was at [facility] Ed yesterday and called today looking to schedule an appt. ED notes indicate that Dr. Russella Dar recommended a follow up appt. Pls advise on scheduling.

## 2019-04-02 NOTE — Telephone Encounter (Signed)
Should be ok for telehealth visit. This is a new patient to the practice (has not been seen) so patient can be seen by any physician.

## 2019-04-02 NOTE — Telephone Encounter (Signed)
Do you think she can be a webex or does this patient need seen in person?

## 2019-04-02 NOTE — Telephone Encounter (Signed)
Patient will have Webex visit with Dr. Orvan Falconer next Thursday4/9/11 11:00

## 2019-04-09 ENCOUNTER — Other Ambulatory Visit: Payer: Self-pay

## 2019-04-09 ENCOUNTER — Ambulatory Visit (INDEPENDENT_AMBULATORY_CARE_PROVIDER_SITE_OTHER): Payer: Commercial Managed Care - PPO | Admitting: Gastroenterology

## 2019-04-09 ENCOUNTER — Other Ambulatory Visit (INDEPENDENT_AMBULATORY_CARE_PROVIDER_SITE_OTHER): Payer: Commercial Managed Care - PPO

## 2019-04-09 ENCOUNTER — Encounter: Payer: Self-pay | Admitting: Gastroenterology

## 2019-04-09 ENCOUNTER — Telehealth: Payer: Self-pay | Admitting: Gastroenterology

## 2019-04-09 DIAGNOSIS — E876 Hypokalemia: Secondary | ICD-10-CM

## 2019-04-09 DIAGNOSIS — R739 Hyperglycemia, unspecified: Secondary | ICD-10-CM | POA: Diagnosis not present

## 2019-04-09 LAB — CBC WITH DIFFERENTIAL/PLATELET
Basophils Absolute: 0 10*3/uL (ref 0.0–0.1)
Basophils Relative: 0.4 % (ref 0.0–3.0)
Eosinophils Absolute: 0.1 10*3/uL (ref 0.0–0.7)
Eosinophils Relative: 1 % (ref 0.0–5.0)
HCT: 34 % — ABNORMAL LOW (ref 36.0–46.0)
Hemoglobin: 11.9 g/dL — ABNORMAL LOW (ref 12.0–15.0)
Lymphocytes Relative: 42.7 % (ref 12.0–46.0)
Lymphs Abs: 2.8 10*3/uL (ref 0.7–4.0)
MCHC: 34.9 g/dL (ref 30.0–36.0)
MCV: 82.9 fl (ref 78.0–100.0)
Monocytes Absolute: 0.5 10*3/uL (ref 0.1–1.0)
Monocytes Relative: 7 % (ref 3.0–12.0)
Neutro Abs: 3.2 10*3/uL (ref 1.4–7.7)
Neutrophils Relative %: 48.9 % (ref 43.0–77.0)
Platelets: 160 10*3/uL (ref 150.0–400.0)
RBC: 4.11 Mil/uL (ref 3.87–5.11)
RDW: 14.4 % (ref 11.5–15.5)
WBC: 6.5 10*3/uL (ref 4.0–10.5)

## 2019-04-09 LAB — BASIC METABOLIC PANEL
BUN: 7 mg/dL (ref 6–23)
CO2: 28 mEq/L (ref 19–32)
Calcium: 9.1 mg/dL (ref 8.4–10.5)
Chloride: 105 mEq/L (ref 96–112)
Creatinine, Ser: 0.66 mg/dL (ref 0.40–1.20)
GFR: 111.32 mL/min (ref >60.00)
Glucose, Bld: 78 mg/dL (ref 70–99)
Potassium: 3.5 mEq/L (ref 3.5–5.1)
Sodium: 139 mEq/L (ref 135–145)

## 2019-04-09 LAB — HEPATIC FUNCTION PANEL
ALT: 17 U/L (ref 0–35)
AST: 19 U/L (ref 0–37)
Albumin: 4.2 g/dL (ref 3.5–5.2)
Alkaline Phosphatase: 58 U/L (ref 39–117)
Bilirubin, Direct: 0.4 mg/dL — ABNORMAL HIGH (ref 0.0–0.3)
Total Bilirubin: 2.7 mg/dL — ABNORMAL HIGH (ref 0.2–1.2)
Total Protein: 7.2 g/dL (ref 6.0–8.3)

## 2019-04-09 LAB — HEMOGLOBIN A1C: Hgb A1c MFr Bld: 4.8 % (ref 4.6–6.5)

## 2019-04-09 LAB — BILIRUBIN, DIRECT: Bilirubin, Direct: 0.4 mg/dL — ABNORMAL HIGH (ref 0.0–0.3)

## 2019-04-09 NOTE — Telephone Encounter (Signed)
Pt called inquiring about lab results from today, she stated that she was told that she was going to receive a call with results this afternoon.

## 2019-04-09 NOTE — Patient Instructions (Signed)
I have recommended some labs today.   I will communicate with PA Bufford Buttner after the results are available.

## 2019-04-09 NOTE — Progress Notes (Signed)
TELEHEALTH VISIT  Referring Provider: Denny Levy, PA Primary Care Physician:  Denny Levy, [place]   Tele-visit due to COVID-19 pandemic Patient requested visit virtually, consented to the virtual encounter via video enabled telemedicine application Contact made at: 11:00 04/09/19 Patient verified by name and date of birth Location of patient: Home Location provider: [facility] Names of persons participating: Me, patient, [name] CMA Time spent on telehealth visit: 32 minutes I discussed the limitations of evaluation and management by telemedicine. The patient expressed understanding and agreed to proceed.  Reason for Consultation: Hyperbilirubinemia   IMPRESSION:  Hyperbilirubinemia    -Total bilirubin 2.7 04/01/2019    - CT abd/pelvis 04/01/19: normal liver and biliary tree    - acute hepatitis panel negative    - urine negative for bilirubin Upper limit of normal spleen size noted on recent CT    - platelets 158, with a range of 1 71-200 over the last 3 years Hypokalemia while in the ED 04/01/2019 Hyperglycemia noted while in the ED 04/01/2019 Multiple systemic complaints Prior cholecystectomy  Patient reports recent scleral icterus with associated hyperbilirubinemia in the setting of multiple systemic complaints.  The remainder of her liver enzymes are completely normal.  Contrasted CT of the abdomen pelvis is reassuring for hepatobiliary pathology.  Over the last 4 months the patient has had serologies for chronic viral hepatitis, autoimmune hepatitis, and HIV.  She reports borderline mono testing through her PCP.  We will fractionate her bilirubin and repeat her hepatic function panel to determine the etiology of her hyperbilirubinemia.  Given her ongoing symptoms I will repeat testing for hypokalemia, obtain a CBC with differential to look for progressive thrombocytopenia, the onset of leukocytosis, or the development of anemia, and obtain a hemoglobin A1c  given her noted mild hyperglycemia.  I have agreed to review these results with PA Carleene Cooper when they are available to make additional recommendations.  PLAN: Hepatic function panel, direct bilirubin BMP HgbA1C CBC with diff   HPI: Carol Bernard is a 24 y.o. 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  The history is obtained through the patient review of her electronic health record.  She has a history of antiphospholipid syndrome and a positive lupus anticoagulant.  She has had 5 first trimester miscarriages.  Recent notes from hematology and her primary care provider note recent evaluation for diffuse body pains, fatigue, and neuropathy.  The labs obtained in that evaluation are outlined below.  She recently started Prozac for possible depression.  Awoke with scleral icterus. PCP telehealth visit confirmed scleral icterus and found an elevated bilirubin. Referred to  ED 04/01/19.  Hepatitis testing was negative. CT abd/pelvis revealed no cause.   Has been having ongoing weakness, lightheadness, and a vague, constant, non-radiating pain in the right side for several months.  She has some nausea and anorexia.  Was told that one of her mono tests was borderline positive. When seen in the ED she had been taking Tylenol thousand milligrams daily 2 days for the abdominal pain.  No known sick contacts. She doesn't feel like she has an acute infection. No other associated symptoms. No identified exacerbating or relieving features.   Labs 11/06/17: total bilirubin 1.3 Labs 04/28/18: RPR nonreactive, HIV nonreactive Labs 11/1318: total bilirubin 0.9 Labs 09/26/18: total bilirubin 1.2 Labs 01/28/19: Negative acute hepatitis panel, RCP <0.8, normal IgG, IgM, and IgA, ESR 4, TSH 0.85, ANA negative, RA negative  Labs 04/01/19: total bilirubin 2.7, negative urine bilirubin, acute hepatitis panel negative, CK and CKMD normal, platelets 158, normal CMP except  K 3.3, glucose 101, Tylenol level undetected CT abd/pelvis with contrast 04/01/19: normal liver and biliary tree, underdistended colon, "top-normal" spleen size  She was given a potassium in the ED. They told her to stay away from over the counter medications.   No known family history of liver disease. Multiple family members including both parents with symptomatic gallbladder disease requiring cholecystectomy. No known family history of colon cancer or polyps.   Past Medical History:  Diagnosis Date  . Allergy   . Anxiety and depression   . Asthma   . Birth defect   . Blood transfusion without reported diagnosis   . Clotting disorder ([place])   . Complication of anesthesia    'I had stridor after my 1st D&C but with my 2nd one they gave me a breathing treatment before my surgery and I did ok".  . Cyst of left kidney    frequent cysts to kidneys  . Dysrhythmia    h/o SVT  . Family history of adverse reaction to anesthesia   . GERD (gastroesophageal reflux disease)   . Headache   . History of recurrent miscarriages   . Hypoglycemia   . Lupus anticoagulant positive   . Neuropathic pain   . Palpitations     Past Surgical History:  Procedure Laterality Date  . CESAREAN SECTION    . CHOLECYSTECTOMY N/A 07/23/2018   Procedure: LAPAROSCOPIC CHOLECYSTECTOMY;  Surgeon: Virl Cagey, MD;  Location: [facility];  Service: General;  Laterality: N/A;  . DILATION AND CURETTAGE OF UTERUS N/A 01/29/2017   Procedure: SUCTION DILATATION AND CURETTAGE;  Surgeon: Jonnie Kind, MD;  Location: [facility];  Service: Gynecology;  Laterality: N/A;  . DILATION AND CURETTAGE OF UTERUS N/A 05/01/2017   Procedure: SUCTION DILATATION AND CURETTAGE;  Surgeon: Florian Buff, MD;  Location: [facility];  Service: Gynecology;  Laterality: N/A;  pt to arrive at 7:15 to have labwork  . FETOSCOPIC LASER PHOTOCOAGULATION  11/14/2017   of 34 placental vessels for TTTS  . WISDOM TOOTH EXTRACTION      Current Outpatient  Medications  Medication Sig Dispense Refill  . albuterol (VENTOLIN HFA) 108 (90 Base) MCG/ACT inhaler Inhale 2 puffs into the lungs every 6 (six) hours as needed for wheezing or shortness of breath. 1 Inhaler 0  . escitalopram (LEXAPRO) 20 MG tablet Take 20 mg by mouth daily.    Marland Kitchen gabapentin (NEURONTIN) 300 MG capsule TAKE 1 CAPSULE BY MOUTH THREE TIMES A DAY (Patient taking differently: Take 300 mg by mouth 2 (two) times daily. ) 270 capsule 2  . lidocaine (LIDODERM) 5 % Place 1 patch onto the skin daily as needed (back pain). May wear up to 12 hours    . LORazepam (ATIVAN) 1 MG tablet Take 0.5-1 tablets (0.5-1 mg total) by mouth every 8 (eight) hours as needed for anxiety. 15 tablet 2  . medroxyPROGESTERone (DEPO-PROVERA) 150 MG/ML injection Inject 1 mL (150 mg total) into the muscle every 3 (three) months. 1 mL 3  . topiramate (TOPAMAX) 100 MG tablet Take 100 mg by mouth at bedtime.    Marland Kitchen zolpidem (AMBIEN) 5 MG tablet Take 1 tablet (5 mg total) by mouth at bedtime as needed for sleep. 30 tablet 2  . acetaminophen (TYLENOL) 500 MG tablet Take 500 mg by mouth every 6 (six) hours as needed for moderate pain, fever or  headache.     No current facility-administered medications for this visit.     Allergies as of 04/09/2019 - Review Complete 04/09/2019  Allergen Reaction Noted  . Pecan nut (diagnostic) Hives 01/22/2017  . Reglan [metoclopramide] Hives 01/22/2017    Family History  Adopted: Yes  Problem Relation Age of Onset  . Migraines Sister   . Hypertension Maternal Grandmother   . Skin cancer Maternal Grandmother   . Heart disease Maternal Grandfather     Social History   Socioeconomic History  . Marital status: Single    Spouse name: Not on file  . Number of children: Not on file  . Years of education: Not on file  . Highest education level: Not on file  Occupational History  . Occupation: Doctor, hospital  . Occupation: Chartered certified accountant  Social Needs  . Financial resource  strain: Not on file  . Food insecurity:    Worry: Not on file    Inability: Not on file  . Transportation needs:    Medical: Not on file    Non-medical: Not on file  Tobacco Use  . Smoking status: Never Smoker  . Smokeless tobacco: Never Used  Substance and Sexual Activity  . Alcohol use: No    Alcohol/week: 0.0 standard drinks  . Drug use: No  . Sexual activity: Not Currently    Birth control/protection: None  Lifestyle  . Physical activity:    Days per week: Not on file    Minutes per session: Not on file  . Stress: Not on file  Relationships  . Social connections:    Talks on phone: Not on file    Gets together: Not on file    Attends religious service: Not on file    Active member of club or organization: Not on file    Attends meetings of clubs or organizations: Not on file    Relationship status: Not on file  . Intimate partner violence:    Fear of current or ex partner: Not on file    Emotionally abused: Not on file    Physically abused: Not on file    Forced sexual activity: Not on file  Other Topics Concern  . Not on file  Social History Narrative  . Not on file    Review of Systems: ALL ROS discussed and all others negative except listed in HPI. Denies shortness of breath but feels heavy with breathing.   Physical Exam: General: in no acute distress. Tearful.  Neuro: Alert and appropriate Abd: umbilical ring, she localizes the pain in the right mid abdomen Psych: Normal affect and normal insight   Alanson Hausmann L. Tarri Glenn, MD, MPH [facility] 04/09/2019, 10:59 AM

## 2019-04-09 NOTE — Telephone Encounter (Signed)
Please see lab results for details.

## 2019-04-13 ENCOUNTER — Other Ambulatory Visit (HOSPITAL_COMMUNITY): Payer: Self-pay | Admitting: *Deleted

## 2019-04-13 ENCOUNTER — Other Ambulatory Visit: Payer: Self-pay

## 2019-04-13 ENCOUNTER — Inpatient Hospital Stay (HOSPITAL_COMMUNITY): Payer: Commercial Managed Care - PPO | Attending: Nurse Practitioner

## 2019-04-13 DIAGNOSIS — D6861 Antiphospholipid syndrome: Secondary | ICD-10-CM | POA: Diagnosis present

## 2019-04-13 DIAGNOSIS — Z79899 Other long term (current) drug therapy: Secondary | ICD-10-CM | POA: Diagnosis not present

## 2019-04-13 LAB — CBC WITH DIFFERENTIAL/PLATELET
Abs Immature Granulocytes: 0.01 K/uL (ref 0.00–0.07)
Basophils Absolute: 0 K/uL (ref 0.0–0.1)
Basophils Relative: 0 %
Eosinophils Absolute: 0.1 K/uL (ref 0.0–0.5)
Eosinophils Relative: 1 %
HCT: 35.5 % — ABNORMAL LOW (ref 36.0–46.0)
Hemoglobin: 11.3 g/dL — ABNORMAL LOW (ref 12.0–15.0)
Immature Granulocytes: 0 %
Lymphocytes Relative: 48 %
Lymphs Abs: 2.7 K/uL (ref 0.7–4.0)
MCH: 28.4 pg (ref 26.0–34.0)
MCHC: 31.8 g/dL (ref 30.0–36.0)
MCV: 89.2 fL (ref 80.0–100.0)
Monocytes Absolute: 0.3 K/uL (ref 0.1–1.0)
Monocytes Relative: 6 %
Neutro Abs: 2.6 K/uL (ref 1.7–7.7)
Neutrophils Relative %: 45 %
Platelets: 157 K/uL (ref 150–400)
RBC: 3.98 MIL/uL (ref 3.87–5.11)
RDW: 15.3 % (ref 11.5–15.5)
WBC: 5.6 K/uL (ref 4.0–10.5)
nRBC: 0 % (ref 0.0–0.2)

## 2019-04-13 LAB — RETICULOCYTES
Immature Retic Fract: 10.9 % (ref 2.3–15.9)
RBC.: 3.98 MIL/uL (ref 3.87–5.11)
Retic Count, Absolute: 199.4 10*3/uL — ABNORMAL HIGH (ref 19.0–186.0)
Retic Ct Pct: 5 % — ABNORMAL HIGH (ref 0.4–3.1)

## 2019-04-13 LAB — COMPREHENSIVE METABOLIC PANEL
ALT: 26 U/L (ref 0–44)
AST: 28 U/L (ref 15–41)
Albumin: 4.2 g/dL (ref 3.5–5.0)
Alkaline Phosphatase: 54 U/L (ref 38–126)
Anion gap: 6 (ref 5–15)
BUN: 13 mg/dL (ref 6–20)
CO2: 26 mmol/L (ref 22–32)
Calcium: 8.9 mg/dL (ref 8.9–10.3)
Chloride: 106 mmol/L (ref 98–111)
Creatinine, Ser: 0.54 mg/dL (ref 0.44–1.00)
GFR calc Af Amer: >60 mL/min (ref >60)
GFR calc non Af Amer: >60 mL/min (ref >60)
Glucose, Bld: 92 mg/dL (ref 70–99)
Potassium: 4.5 mmol/L (ref 3.5–5.1)
Sodium: 138 mmol/L (ref 135–145)
Total Bilirubin: 2.8 mg/dL — ABNORMAL HIGH (ref 0.3–1.2)
Total Protein: 7.4 g/dL (ref 6.5–8.1)

## 2019-04-13 LAB — BILIRUBIN, DIRECT: Bilirubin, Direct: 0.2 mg/dL (ref 0.0–0.2)

## 2019-04-13 LAB — LACTATE DEHYDROGENASE: LDH: 276 U/L — ABNORMAL HIGH (ref 98–192)

## 2019-04-14 ENCOUNTER — Other Ambulatory Visit (HOSPITAL_COMMUNITY): Payer: Self-pay

## 2019-04-14 ENCOUNTER — Ambulatory Visit (HOSPITAL_COMMUNITY): Payer: Self-pay | Admitting: Hematology

## 2019-04-14 ENCOUNTER — Other Ambulatory Visit (HOSPITAL_COMMUNITY): Payer: Self-pay | Admitting: *Deleted

## 2019-04-14 LAB — DIRECT ANTIGLOBULIN TEST (NOT AT ARMC)
DAT, IgG: NEGATIVE
DAT, complement: NEGATIVE

## 2019-04-14 LAB — HAPTOGLOBIN: Haptoglobin: <10 mg/dL — ABNORMAL LOW (ref 33–278)

## 2019-04-15 ENCOUNTER — Inpatient Hospital Stay (HOSPITAL_COMMUNITY): Payer: Commercial Managed Care - PPO

## 2019-04-15 ENCOUNTER — Other Ambulatory Visit: Payer: Self-pay

## 2019-04-16 ENCOUNTER — Other Ambulatory Visit: Payer: Self-pay

## 2019-04-17 ENCOUNTER — Inpatient Hospital Stay (HOSPITAL_COMMUNITY): Payer: Commercial Managed Care - PPO

## 2019-04-17 ENCOUNTER — Ambulatory Visit (HOSPITAL_COMMUNITY): Payer: Self-pay | Admitting: Hematology

## 2019-04-17 ENCOUNTER — Encounter (HOSPITAL_COMMUNITY): Payer: Self-pay | Admitting: Hematology

## 2019-04-17 ENCOUNTER — Inpatient Hospital Stay (HOSPITAL_BASED_OUTPATIENT_CLINIC_OR_DEPARTMENT_OTHER): Payer: Commercial Managed Care - PPO | Admitting: Hematology

## 2019-04-17 DIAGNOSIS — D6861 Antiphospholipid syndrome: Secondary | ICD-10-CM | POA: Diagnosis not present

## 2019-04-17 DIAGNOSIS — Z79899 Other long term (current) drug therapy: Secondary | ICD-10-CM | POA: Diagnosis not present

## 2019-04-17 DIAGNOSIS — N96 Recurrent pregnancy loss: Secondary | ICD-10-CM

## 2019-04-17 LAB — CBC WITH DIFFERENTIAL/PLATELET
Abs Immature Granulocytes: 0.01 10*3/uL (ref 0.00–0.07)
Abs Immature Granulocytes: 0.01 10*3/uL (ref 0.00–0.07)
Basophils Absolute: 0 10*3/uL (ref 0.0–0.1)
Basophils Absolute: 0 10*3/uL (ref 0.0–0.1)
Basophils Relative: 0 %
Basophils Relative: 0 %
Eosinophils Absolute: 0.1 10*3/uL (ref 0.0–0.5)
Eosinophils Absolute: 0.1 10*3/uL (ref 0.0–0.5)
Eosinophils Relative: 1 %
Eosinophils Relative: 1 %
HCT: 38.4 % (ref 36.0–46.0)
HCT: 38.6 % (ref 36.0–46.0)
Hemoglobin: 12.2 g/dL (ref 12.0–15.0)
Hemoglobin: 12.6 g/dL (ref 12.0–15.0)
Immature Granulocytes: 0 %
Immature Granulocytes: 0 %
Lymphocytes Relative: 36 %
Lymphocytes Relative: 37 %
Lymphs Abs: 1.8 10*3/uL (ref 0.7–4.0)
Lymphs Abs: 1.9 10*3/uL (ref 0.7–4.0)
MCH: 28.2 pg (ref 26.0–34.0)
MCH: 29 pg (ref 26.0–34.0)
MCHC: 31.6 g/dL (ref 30.0–36.0)
MCHC: 32.8 g/dL (ref 30.0–36.0)
MCV: 88.5 fL (ref 80.0–100.0)
MCV: 89.4 fL (ref 80.0–100.0)
Monocytes Absolute: 0.3 10*3/uL (ref 0.1–1.0)
Monocytes Absolute: 0.3 10*3/uL (ref 0.1–1.0)
Monocytes Relative: 7 %
Monocytes Relative: 7 %
Neutro Abs: 2.8 10*3/uL (ref 1.7–7.7)
Neutro Abs: 2.8 10*3/uL (ref 1.7–7.7)
Neutrophils Relative %: 55 %
Neutrophils Relative %: 56 %
Platelets: 179 10*3/uL (ref 150–400)
Platelets: 184 10*3/uL (ref 150–400)
RBC: 4.32 MIL/uL (ref 3.87–5.11)
RBC: 4.34 MIL/uL (ref 3.87–5.11)
RDW: 15.6 % — ABNORMAL HIGH (ref 11.5–15.5)
RDW: 15.8 % — ABNORMAL HIGH (ref 11.5–15.5)
WBC: 5 10*3/uL (ref 4.0–10.5)
WBC: 5.2 10*3/uL (ref 4.0–10.5)
nRBC: 0 % (ref 0.0–0.2)
nRBC: 0 % (ref 0.0–0.2)

## 2019-04-17 LAB — RETICULOCYTES
Immature Retic Fract: 7.7 % (ref 2.3–15.9)
Immature Retic Fract: 8.3 % (ref 2.3–15.9)
RBC.: 4.32 MIL/uL (ref 3.87–5.11)
RBC.: 4.34 MIL/uL (ref 3.87–5.11)
Retic Count, Absolute: 179.7 10*3/uL (ref 19.0–186.0)
Retic Count, Absolute: 184.5 10*3/uL (ref 19.0–186.0)
Retic Ct Pct: 4.2 % — ABNORMAL HIGH (ref 0.4–3.1)
Retic Ct Pct: 4.3 % — ABNORMAL HIGH (ref 0.4–3.1)

## 2019-04-17 LAB — LACTATE DEHYDROGENASE: LDH: 236 U/L — ABNORMAL HIGH (ref 98–192)

## 2019-04-17 NOTE — Patient Instructions (Addendum)
[facility] Discharge Instructions  You were seen today by Dr. Ellin Saba. He went over your recent lab results. He will have labs drawn today.  He will see you back in 1 week for follow up.   Thank you for choosing [facility] at [facility] to provide your oncology and hematology care.  To afford each patient quality time with our provider, please arrive at least 15 minutes before your scheduled appointment time.   If you have a lab appointment with the Cancer Center please come in thru the  Main Entrance and check in at the main information desk  You need to re-schedule your appointment should you arrive 10 or more minutes late.  We strive to give you quality time with our providers, and arriving late affects you and other patients whose appointments are after yours.  Also, if you no show three or more times for appointments you may be dismissed from the clinic at the providers discretion.     Again, thank you for choosing [facility].  Our hope is that these requests will decrease the amount of time that you wait before being seen by our physicians.       _____________________________________________________________  Should you have questions after your visit to [facility], please contact our office at [phone] between the hours of 8:00 a.m. and 4:30 p.m.  Voicemails left after 4:00 p.m. will not be returned until the following business day.  For prescription refill requests, have your pharmacy contact our office and allow 72 hours.    Cancer Center Support Programs:   > Cancer Support Group  2nd Tuesday of the month 1pm-2pm, Journey Room

## 2019-04-17 NOTE — Progress Notes (Signed)
[facility] [address] [place] , [place] [address]   CLINIC:  Medical Oncology/Hematology  PCP:  Carol Bernard, Carol Bernard, [place] [address] [place] [id number] [phone]   REASON FOR VISIT:  Follow-up for Evaluation of antiphosphate syndrome.  CURRENT THERAPY: observation    INTERVAL HISTORY:  [name] 24 y.o. female returns for routine follow-up. She is here today alone. She states that she has some nausea, as well as yellowish mucus in her stool. She states that she has a heaviness when breathing but not shortness of breath. She states that she also has some dizziness at times. Denies any nausea, vomiting, or diarrhea. Denies any new pains. Had not noticed any recent bleeding such as epistaxis, hematuria or hematochezia. Denies recent chest pain on exertion, shortness of breath on minimal exertion, pre-syncopal episodes, or palpitations. Denies any numbness or tingling in hands or feet. Denies any recent fevers, infections, or recent hospitalizations. Patient reports appetite at 50% and energy level at 0%.    REVIEW OF SYSTEMS:  Review of Systems  Gastrointestinal: Positive for nausea.  Neurological: Positive for dizziness.  All other systems reviewed and are negative.    PAST MEDICAL/SURGICAL HISTORY:  Past Medical History:  Diagnosis Date  . Allergy   . Anxiety and depression   . Asthma   . Birth defect   . Blood transfusion without reported diagnosis   . Clotting disorder ([facility])   . Complication of anesthesia    'I had stridor after my 1st D&C but with my 2nd one they gave me a breathing treatment before my surgery and I did ok".  . Cyst of left kidney    frequent cysts to kidneys  . Dysrhythmia    h/o SVT  . Family history of adverse reaction to anesthesia   . GERD (gastroesophageal reflux disease)   . Headache   . History of recurrent miscarriages   . Hypoglycemia   . Lupus anticoagulant positive   . Neuropathic pain   . Palpitations    Past  Surgical History:  Procedure Laterality Date  . CESAREAN SECTION    . CHOLECYSTECTOMY N/A 07/23/2018   Procedure: LAPAROSCOPIC CHOLECYSTECTOMY;  Surgeon: Carol RoersBridges, Lindsay C, MD;  Location: [facility];  Service: General;  Laterality: N/A;  . DILATION AND CURETTAGE OF UTERUS N/A 01/29/2017   Procedure: SUCTION DILATATION AND CURETTAGE;  Surgeon: Tilda BurrowJohn Ferguson V, MD;  Location: [facility];  Service: Gynecology;  Laterality: N/A;  . DILATION AND CURETTAGE OF UTERUS N/A 05/01/2017   Procedure: SUCTION DILATATION AND CURETTAGE;  Surgeon: Lazaro ArmsLuther H Eure, MD;  Location: [facility];  Service: Gynecology;  Laterality: N/A;  pt to arrive at 7:15 to have labwork  . FETOSCOPIC LASER PHOTOCOAGULATION  11/14/2017   of 34 placental vessels for TTTS  . WISDOM TOOTH EXTRACTION       SOCIAL HISTORY:  Social History   Socioeconomic History  . Marital status: Single    Spouse name: Not on file  . Number of children: Not on file  . Years of education: Not on file  . Highest education level: Not on file  Occupational History  . Occupation: Loss adjuster, charteredneonatal secretary  . Occupation: Psychologist, sport and exercisenurse tech  Social Needs  . Financial resource strain: Not on file  . Food insecurity:    Worry: Not on file    Inability: Not on file  . Transportation needs:    Medical: Not on file    Non-medical: Not on file  Tobacco Use  . Smoking  status: Never Smoker  . Smokeless tobacco: Never Used  Substance and Sexual Activity  . Alcohol use: No    Alcohol/week: 0.0 standard drinks  . Drug use: No  . Sexual activity: Not Currently    Birth control/protection: None  Lifestyle  . Physical activity:    Days per week: Not on file    Minutes per session: Not on file  . Stress: Not on file  Relationships  . Social connections:    Talks on phone: Not on file    Gets together: Not on file    Attends religious service: Not on file    Active member of club or organization: Not on file    Attends meetings of clubs or organizations: Not on file     Relationship status: Not on file  . Intimate partner violence:    Fear of current or ex partner: Not on file    Emotionally abused: Not on file    Physically abused: Not on file    Forced sexual activity: Not on file  Other Topics Concern  . Not on file  Social History Narrative  . Not on file    FAMILY HISTORY:  Family History  Adopted: Yes  Problem Relation Age of Onset  . Migraines Sister   . Hypertension Maternal Grandmother   . Skin cancer Maternal Grandmother   . Heart disease Maternal Grandfather     CURRENT MEDICATIONS:  Outpatient Encounter Medications as of 04/17/2019  Medication Sig Note  . albuterol (VENTOLIN HFA) 108 (90 Base) MCG/ACT inhaler Inhale 2 puffs into the lungs every 6 (six) hours as needed for wheezing or shortness of breath.   . escitalopram (LEXAPRO) 20 MG tablet Take 20 mg by mouth daily.   Marland Kitchen gabapentin (NEURONTIN) 300 MG capsule TAKE 1 CAPSULE BY MOUTH THREE TIMES A DAY (Patient taking differently: Take 300 mg by mouth 2 (two) times daily. )   . lidocaine (LIDODERM) 5 % Place 1 patch onto the skin daily as needed (back pain). May wear up to 12 hours   . medroxyPROGESTERone (DEPO-PROVERA) 150 MG/ML injection Inject 1 mL (150 mg total) into the muscle every 3 (three) months.   . topiramate (TOPAMAX) 100 MG tablet Take 100 mg by mouth at bedtime.   Marland Kitchen acetaminophen (TYLENOL) 500 MG tablet Take 500 mg by mouth every 6 (six) hours as needed for moderate pain, fever or headache.   Marland Kitchen LORazepam (ATIVAN) 1 MG tablet Take 0.5-1 tablets (0.5-1 mg total) by mouth every 8 (eight) hours as needed for anxiety. (Patient not taking: Reported on 04/17/2019) 04/01/2019: Pt has not needed it for a few months  . zolpidem (AMBIEN) 5 MG tablet Take 1 tablet (5 mg total) by mouth at bedtime as needed for sleep. (Patient not taking: Reported on 04/17/2019)    No facility-administered encounter medications on file as of 04/17/2019.     ALLERGIES:  Allergies  Allergen Reactions  .  Pecan Nut (Diagnostic) Hives  . Reglan [Metoclopramide] Hives     PHYSICAL EXAM:  ECOG Performance status: 0  Vitals:   04/17/19 1030  BP: 116/82  Pulse: 75  Resp: 18  Temp: 98.3 F (36.8 C)  SpO2: 100%   Filed Weights   04/17/19 1030  Weight: 133 lb 3.2 oz (60.4 kg)    Physical Exam Vitals signs reviewed.  Constitutional:      Appearance: Normal appearance.  Cardiovascular:     Rate and Rhythm: Normal rate and regular rhythm.  Heart sounds: Normal heart sounds.  Pulmonary:     Effort: Pulmonary effort is normal.     Breath sounds: Normal breath sounds.  Abdominal:     General: There is no distension.     Palpations: Abdomen is soft. There is no mass.  Musculoskeletal:        General: No swelling.  Skin:    General: Skin is dry.  Neurological:     General: No focal deficit present.     Mental Status: She is alert and oriented to person, place, and time.  Psychiatric:        Mood and Affect: Mood normal.        Behavior: Behavior normal.      LABORATORY DATA:  I have reviewed the labs as listed.  CBC    Component Value Date/Time   WBC 5.0 04/17/2019 1224   WBC 5.2 04/17/2019 1224   RBC 4.32 04/17/2019 1224   RBC 4.32 04/17/2019 1224   RBC 4.34 04/17/2019 1224   RBC 4.34 04/17/2019 1224   HGB 12.2 04/17/2019 1224   HGB 12.6 04/17/2019 1224   HGB 12.1 04/28/2018 1611   HCT 38.6 04/17/2019 1224   HCT 38.4 04/17/2019 1224   HCT 35.5 04/28/2018 1611   PLT 179 04/17/2019 1224   PLT 184 04/17/2019 1224   PLT 187 04/28/2018 1611   MCV 89.4 04/17/2019 1224   MCV 88.5 04/17/2019 1224   MCV 83 04/28/2018 1611   MCH 28.2 04/17/2019 1224   MCH 29.0 04/17/2019 1224   MCHC 31.6 04/17/2019 1224   MCHC 32.8 04/17/2019 1224   RDW 15.6 (H) 04/17/2019 1224   RDW 15.8 (H) 04/17/2019 1224   RDW 14.0 04/28/2018 1611   LYMPHSABS 1.8 04/17/2019 1224   LYMPHSABS 1.9 04/17/2019 1224   LYMPHSABS 1.4 04/28/2018 1611   MONOABS 0.3 04/17/2019 1224   MONOABS 0.3  04/17/2019 1224   EOSABS 0.1 04/17/2019 1224   EOSABS 0.1 04/17/2019 1224   EOSABS 0.1 04/28/2018 1611   BASOSABS 0.0 04/17/2019 1224   BASOSABS 0.0 04/17/2019 1224   BASOSABS 0.0 04/28/2018 1611   CMP Latest Ref Rng & Units 04/13/2019 04/09/2019 04/01/2019  Glucose 70 - 99 mg/dL 92 78 960(A)  BUN 6 - 20 mg/dL Creatinine 0.44 - 1.00 mg/dL 5.40 9.81 1.91  Sodium 135 - 145 mmol/L 138 139 137  Potassium 3.5 - 5.1 mmol/L 4.5 3.5 3.3(L)  Chloride 98 - 111 mmol/L 106 105 105  CO2 22 - 32 mmol/L Calcium 8.9 - 10.3 mg/dL 8.9 9.1 9.0  Total Protein 6.5 - 8.1 g/dL 7.4 7.2 7.5  Total Bilirubin 0.3 - 1.2 mg/dL 2.8(H) 2.7(H) 2.7(H)  Alkaline Phos 38 - 126 U/L 54 58 54  AST 15 - 41 U/L ALT 0 - 44 U/L DIAGNOSTIC IMAGING:  I have independently reviewed the scans and discussed with the patient.   I have reviewed Willy Eddy LPN's note and agree with the documentation.  I personally performed a face-to-face visit, made revisions and my assessment and plan is as follows.    ASSESSMENT & PLAN:   History of multiple miscarriages 1.  Hemolytic anemia: - Patient reportedly had icteric sclera recently.  Her total bilirubin on 04/01/2019 was elevated at 2.7. - Her hemoglobin on 04/13/2027 dropped to 11.9.  Total bilirubin was 2.7 and direct bilirubin was 0.4.  She was evaluated by  Dr. Orvan Falconer from GI and was referred to [place] for further evaluation. -She denies taking any new medications including antibiotics.  She denies any over-the-counter herbal supplements.  She takes elderberry extract. - Denies any recent history of blood transfusions.  She had blood transfusion when she was pregnant about 1 and half year ago. - Denies any fevers or night sweats.  She is working full-time at the [employer] in [place]. - We have repeated blood work on 04/13/2019, total bilirubin was 2.8 and direct bilirubin was 0.2.  Hemoglobin was 11.3.  LDH was 276.   Reticulocyte count was 5%. - I have done a direct Coombs test which was negative for IgG and complement. - We have repeated her CBC today which improved to 12.6.  LDH improved to 236 and reticulocyte count was 4.2%. - I will see her back next week and repeat her CBC, LFTs, LDH and reticulocyte count. - Differential diagnosis includes Coombs negative hemolytic anemia.  We will check for IgA related hemolytic anemia by checking super Coombs test. -I have also told her to discontinue Lexapro as it can cause hemolysis and less than 1% of cases.  Total time spent is 25 minutes with more than 50% of the time spent face-to-face discussing her diagnosis, need for further testing and management options and coordination of care.    Orders placed this encounter:  Orders Placed This Encounter  Procedures  . CBC with Differential  . Reticulocytes  . Lactate dehydrogenase      Doreatha Massed, MD [facility] [phone]

## 2019-04-17 NOTE — Assessment & Plan Note (Addendum)
1.  Hemolytic anemia: - Patient reportedly had icteric sclera recently.  Her total bilirubin on 04/01/2019 was elevated at 2.7. - Her hemoglobin on 04/13/2027 dropped to 11.9.  Total bilirubin was 2.7 and direct bilirubin was 0.4.  She was evaluated by Dr. Orvan Falconer from GI and was referred to [place] for further evaluation. -She denies taking any new medications including antibiotics.  She denies any over-the-counter herbal supplements.  She takes elderberry extract. - Denies any recent history of blood transfusions.  She had blood transfusion when she was pregnant about 1 and half year ago. - Denies any fevers or night sweats.  She is working full-time at the [employer] in [place]. - We have repeated blood work on 04/13/2019, total bilirubin was 2.8 and direct bilirubin was 0.2.  Hemoglobin was 11.3.  LDH was 276.  Reticulocyte count was 5%. - I have done a direct Coombs test which was negative for IgG and complement. - We have repeated her CBC today which improved to 12.6.  LDH improved to 236 and reticulocyte count was 4.2%. - I will see her back next week and repeat her CBC, LFTs, LDH and reticulocyte count. - Differential diagnosis includes Coombs negative hemolytic anemia.  We will check for IgA related hemolytic anemia by checking super Coombs test. -I have also told her to discontinue Lexapro as it can cause hemolysis and less than 1% of cases.

## 2019-04-22 ENCOUNTER — Inpatient Hospital Stay (HOSPITAL_COMMUNITY): Payer: Commercial Managed Care - PPO

## 2019-04-22 ENCOUNTER — Other Ambulatory Visit: Payer: Self-pay

## 2019-04-22 ENCOUNTER — Inpatient Hospital Stay (HOSPITAL_BASED_OUTPATIENT_CLINIC_OR_DEPARTMENT_OTHER): Payer: Commercial Managed Care - PPO | Admitting: Hematology

## 2019-04-22 ENCOUNTER — Encounter (HOSPITAL_COMMUNITY): Payer: Self-pay | Admitting: Hematology

## 2019-04-22 DIAGNOSIS — Z79899 Other long term (current) drug therapy: Secondary | ICD-10-CM

## 2019-04-22 DIAGNOSIS — D6861 Antiphospholipid syndrome: Secondary | ICD-10-CM | POA: Diagnosis not present

## 2019-04-22 DIAGNOSIS — N96 Recurrent pregnancy loss: Secondary | ICD-10-CM

## 2019-04-22 LAB — CBC WITH DIFFERENTIAL/PLATELET
Abs Immature Granulocytes: 0.01 10*3/uL (ref 0.00–0.07)
Basophils Absolute: 0 10*3/uL (ref 0.0–0.1)
Basophils Relative: 0 %
Eosinophils Absolute: 0.1 10*3/uL (ref 0.0–0.5)
Eosinophils Relative: 1 %
HCT: 38.3 % (ref 36.0–46.0)
Hemoglobin: 12.4 g/dL (ref 12.0–15.0)
Immature Granulocytes: 0 %
Lymphocytes Relative: 37 %
Lymphs Abs: 1.9 10*3/uL (ref 0.7–4.0)
MCH: 28.7 pg (ref 26.0–34.0)
MCHC: 32.4 g/dL (ref 30.0–36.0)
MCV: 88.7 fL (ref 80.0–100.0)
Monocytes Absolute: 0.3 10*3/uL (ref 0.1–1.0)
Monocytes Relative: 7 %
Neutro Abs: 2.9 10*3/uL (ref 1.7–7.7)
Neutrophils Relative %: 55 %
Platelets: 176 10*3/uL (ref 150–400)
RBC: 4.32 MIL/uL (ref 3.87–5.11)
RDW: 14.9 % (ref 11.5–15.5)
WBC: 5.2 10*3/uL (ref 4.0–10.5)
nRBC: 0 % (ref 0.0–0.2)

## 2019-04-22 LAB — RETICULOCYTES
Immature Retic Fract: 6.3 % (ref 2.3–15.9)
RBC.: 4.32 MIL/uL (ref 3.87–5.11)
Retic Count, Absolute: 109.3 10*3/uL (ref 19.0–186.0)
Retic Ct Pct: 2.5 % (ref 0.4–3.1)

## 2019-04-22 LAB — HEPATIC FUNCTION PANEL
ALT: 22 U/L (ref 0–44)
AST: 20 U/L (ref 15–41)
Albumin: 4.5 g/dL (ref 3.5–5.0)
Alkaline Phosphatase: 44 U/L (ref 38–126)
Bilirubin, Direct: 0.2 mg/dL (ref 0.0–0.2)
Indirect Bilirubin: 1.6 mg/dL — ABNORMAL HIGH (ref 0.3–0.9)
Total Bilirubin: 1.8 mg/dL — ABNORMAL HIGH (ref 0.3–1.2)
Total Protein: 7.9 g/dL (ref 6.5–8.1)

## 2019-04-22 LAB — LACTATE DEHYDROGENASE: LDH: 181 U/L (ref 98–192)

## 2019-04-22 NOTE — Progress Notes (Signed)
[facility] [address], [place] [address]   CLINIC:  Medical Oncology/Hematology  PCP:  Lawerance Sabal, [place] [address] [place] [address] [phone]   REASON FOR VISIT:  Follow-up for antiphosphate syndrome.  CURRENT THERAPY:observation       INTERVAL HISTORY:  [name] 24 y.o. female returns for routine follow-up. She is here today alone. She states that she experienced nausea, dizziness and headaches since her last visit. She states that she also has noticed her breathing is heavy, not short of breath but heaviness. Denies any vomiting, or diarrhea. Denies any new pains. Had not noticed any recent bleeding such as epistaxis, hematuria or hematochezia. Denies recent chest pain on exertion, shortness of breath on minimal exertion, pre-syncopal episodes, or palpitations. Denies any numbness or tingling in hands or feet. Denies any recent fevers, infections, or recent hospitalizations. Patient reports appetite at 50% and energy level at 0%.  REVIEW OF SYSTEMS:  Review of Systems  Constitutional: Positive for fatigue.  Gastrointestinal: Positive for nausea.  Neurological: Positive for dizziness and headaches.     PAST MEDICAL/SURGICAL HISTORY:  Past Medical History:  Diagnosis Date  . Allergy   . Anxiety and depression   . Asthma   . Birth defect   . Blood transfusion without reported diagnosis   . Clotting disorder (HCC)   . Complication of anesthesia    'I had stridor after my 1st D&C but with my 2nd one they gave me a breathing treatment before my surgery and I did ok".  . Cyst of left kidney    frequent cysts to kidneys  . Dysrhythmia    h/o SVT  . Family history of adverse reaction to anesthesia   . GERD (gastroesophageal reflux disease)   . Headache   . History of recurrent miscarriages   . Hypoglycemia   . Lupus anticoagulant positive   . Neuropathic pain   . Palpitations    Past Surgical History:  Procedure Laterality Date   . CESAREAN SECTION    . CHOLECYSTECTOMY N/A 07/23/2018   Procedure: LAPAROSCOPIC CHOLECYSTECTOMY;  Surgeon: Carol Roers, MD;  Location: [facility];  Service: General;  Laterality: N/A;  . DILATION AND CURETTAGE OF UTERUS N/A 01/29/2017   Procedure: SUCTION DILATATION AND CURETTAGE;  Surgeon: Tilda Burrow, MD;  Location: [facility];  Service: Gynecology;  Laterality: N/A;  . DILATION AND CURETTAGE OF UTERUS N/A 05/01/2017   Procedure: SUCTION DILATATION AND CURETTAGE;  Surgeon: Lazaro Arms, MD;  Location: [facility];  Service: Gynecology;  Laterality: N/A;  pt to arrive at 7:15 to have labwork  . FETOSCOPIC LASER PHOTOCOAGULATION  11/14/2017   of 34 placental vessels for TTTS  . WISDOM TOOTH EXTRACTION       SOCIAL HISTORY:  Social History   Socioeconomic History  . Marital status: Single    Spouse name: Not on file  . Number of children: Not on file  . Years of education: Not on file  . Highest education level: Not on file  Occupational History  . Occupation: Loss adjuster, chartered  . Occupation: Psychologist, sport and exercise  Social Needs  . Financial resource strain: Not on file  . Food insecurity:    Worry: Not on file    Inability: Not on file  . Transportation needs:    Medical: Not on file    Non-medical: Not on file  Tobacco Use  . Smoking status: Never Smoker  . Smokeless tobacco: Never Used  Substance and  Sexual Activity  . Alcohol use: No    Alcohol/week: 0.0 standard drinks  . Drug use: No  . Sexual activity: Not Currently    Birth control/protection: None  Lifestyle  . Physical activity:    Days per week: Not on file    Minutes per session: Not on file  . Stress: Not on file  Relationships  . Social connections:    Talks on phone: Not on file    Gets together: Not on file    Attends religious service: Not on file    Active member of club or organization: Not on file    Attends meetings of clubs or organizations: Not on file    Relationship status: Not on file  . Intimate  partner violence:    Fear of current or ex partner: Not on file    Emotionally abused: Not on file    Physically abused: Not on file    Forced sexual activity: Not on file  Other Topics Concern  . Not on file  Social History Narrative  . Not on file    FAMILY HISTORY:  Family History  Adopted: Yes  Problem Relation Age of Onset  . Migraines Sister   . Hypertension Maternal Grandmother   . Skin cancer Maternal Grandmother   . Heart disease Maternal Grandfather     CURRENT MEDICATIONS:  Outpatient Encounter Medications as of 04/22/2019  Medication Sig Note  . albuterol (VENTOLIN HFA) 108 (90 Base) MCG/ACT inhaler Inhale 2 puffs into the lungs every 6 (six) hours as needed for wheezing or shortness of breath.   . colestipol (COLESTID) 1 g tablet    . gabapentin (NEURONTIN) 300 MG capsule TAKE 1 CAPSULE BY MOUTH THREE TIMES A DAY (Patient taking differently: Take 300 mg by mouth 2 (two) times daily. )   . guaiFENesin-codeine 100-10 MG/5ML syrup Take 5 mLs by mouth as needed.    . lidocaine (LIDODERM) 5 % Place 1 patch onto the skin daily as needed (back pain). May wear up to 12 hours   . LORazepam (ATIVAN) 1 MG tablet Take 0.5-1 tablets (0.5-1 mg total) by mouth every 8 (eight) hours as needed for anxiety. 04/01/2019: Pt has not needed it for a few months  . medroxyPROGESTERone (DEPO-PROVERA) 150 MG/ML injection Inject 1 mL (150 mg total) into the muscle every 3 (three) months.   . montelukast (SINGULAIR) 10 MG tablet    . topiramate (TOPAMAX) 100 MG tablet Take 100 mg by mouth at bedtime.   Marland Kitchen acetaminophen (TYLENOL) 500 MG tablet Take 500 mg by mouth every 6 (six) hours as needed for moderate pain, fever or headache.   . escitalopram (LEXAPRO) 20 MG tablet Take 20 mg by mouth daily.   Marland Kitchen zolpidem (AMBIEN) 5 MG tablet Take 1 tablet (5 mg total) by mouth at bedtime as needed for sleep. (Patient not taking: Reported on 04/17/2019)    No facility-administered encounter medications on file as  of 04/22/2019.     ALLERGIES:  Allergies  Allergen Reactions  . Pecan Nut (Diagnostic) Hives  . Reglan [Metoclopramide] Hives     PHYSICAL EXAM:  ECOG Performance status: 0  Vitals:   04/22/19 1128  BP: 106/66  Pulse: 65  Resp: 18  Temp: 97.8 F (36.6 C)  SpO2: 100%   Filed Weights   04/22/19 1128  Weight: 132 lb 9.6 oz (60.1 kg)    Physical Exam Vitals signs reviewed.  Constitutional:      Appearance: Normal appearance.  Cardiovascular:  Rate and Rhythm: Normal rate and regular rhythm.     Heart sounds: Normal heart sounds.  Pulmonary:     Effort: Pulmonary effort is normal.     Breath sounds: Normal breath sounds.  Abdominal:     General: There is no distension.     Palpations: Abdomen is soft. There is no mass.  Musculoskeletal:        General: No swelling.  Skin:    General: Skin is warm.  Neurological:     General: No focal deficit present.     Mental Status: She is alert and oriented to person, place, and time.  Psychiatric:        Mood and Affect: Mood normal.        Behavior: Behavior normal.      LABORATORY DATA:  I have reviewed the labs as listed.  CBC    Component Value Date/Time   WBC 5.2 04/22/2019 1218   RBC 4.32 04/22/2019 1218   RBC 4.32 04/22/2019 1218   HGB 12.4 04/22/2019 1218   HGB 12.1 04/28/2018 1611   HCT 38.3 04/22/2019 1218   HCT 35.5 04/28/2018 1611   PLT 176 04/22/2019 1218   PLT 187 04/28/2018 1611   MCV 88.7 04/22/2019 1218   MCV 83 04/28/2018 1611   MCH 28.7 04/22/2019 1218   MCHC 32.4 04/22/2019 1218   RDW 14.9 04/22/2019 1218   RDW 14.0 04/28/2018 1611   LYMPHSABS 1.9 04/22/2019 1218   LYMPHSABS 1.4 04/28/2018 1611   MONOABS 0.3 04/22/2019 1218   EOSABS 0.1 04/22/2019 1218   EOSABS 0.1 04/28/2018 1611   BASOSABS 0.0 04/22/2019 1218   BASOSABS 0.0 04/28/2018 1611   CMP Latest Ref Rng & Units 04/22/2019 04/13/2019 04/09/2019  Glucose 70 - 99 mg/dL - 92 78  BUN 6 - 20 mg/dL - 13 7  Creatinine 1.61 - 1.00  mg/dL - 0.96 0.45  Sodium 409 - 145 mmol/L - 138 139  Potassium 3.5 - 5.1 mmol/L - 4.5 3.5  Chloride 98 - 111 mmol/L - 106 105  CO2 22 - 32 mmol/L - 26 28  Calcium 8.9 - 10.3 mg/dL - 8.9 9.1  Total Protein 6.5 - 8.1 g/dL 7.9 7.4 7.2  Total Bilirubin 0.3 - 1.2 mg/dL 8.1(X) 2.8(H) 2.7(H)  Alkaline Phos 38 - 126 U/L 44 54 58  AST 15 - 41 U/L ALT 0 - 44 U/L DIAGNOSTIC IMAGING:  I have independently reviewed the scans and discussed with the patient.   I have reviewed Willy Eddy LPN's note and agree with the documentation.  I personally performed a face-to-face visit, made revisions and my assessment and plan is as follows.    ASSESSMENT & PLAN:   History of multiple miscarriages 1.  Indirect hyperbilirubinemia: - Patient reportedly had icteric sclera recently.  Her total bilirubin on 04/01/2019 was elevated at 2.7. - Her hemoglobin on 04/13/2027 dropped to 11.9.  Total bilirubin was 2.7 and direct bilirubin was 0.4.  She was evaluated by Dr. Orvan Falconer from GI and was referred to [place] for further evaluation. -She denies taking any new medications including antibiotics.  She denies any over-the-counter herbal supplements.  She takes elderberry extract. - Denies any recent history of blood transfusions.  She had blood transfusion when she was pregnant about 1 and half year ago. - Denies any fevers or night sweats.  She is working full-time at the [employer] in [place]. -  We have repeated blood work on 04/13/2019, total bilirubin was 2.8 and direct bilirubin was 0.2.  Hemoglobin was 11.3.  LDH was 276.  Reticulocyte count was 5%. - Direct Coombs test was negative for IgG and complement. -At last visit hemoglobin improved to 12.6.  LDH improved to 236. -I have reviewed blood work from today.  Hemoglobin is 12.4.  LDH has normalized.  Total bilirubin improved to 1.8. -Differential diagnosis includes Gilbert's syndrome. -We have discontinued Lexapro on  04/17/2019. - I have recommended her to be off of Lexapro.  I will see her back in 4 weeks for follow-up with repeat blood work.  Total time spent is 25 minutes with more than 50% of the time spent face-to-face discussing possible diagnosis, differential diagnosis, management and coordination of care.    Orders placed this encounter:  Orders Placed This Encounter  Procedures  . Hepatic function panel  . Reticulocytes  . Lactate dehydrogenase  . CBC with Differential/Platelet      Doreatha MassedSreedhar Katragadda, MD [facility] [phone]

## 2019-04-22 NOTE — Assessment & Plan Note (Addendum)
1.  Indirect hyperbilirubinemia: - Patient reportedly had icteric sclera recently.  Her total bilirubin on 04/01/2019 was elevated at 2.7. - Her hemoglobin on 04/13/2027 dropped to 11.9.  Total bilirubin was 2.7 and direct bilirubin was 0.4.  She was evaluated by Dr. Orvan Falconer from GI and was referred to [place] for further evaluation. -She denies taking any new medications including antibiotics.  She denies any over-the-counter herbal supplements.  She takes elderberry extract. - Denies any recent history of blood transfusions.  She had blood transfusion when she was pregnant about 1 and half year ago. - Denies any fevers or night sweats.  She is working full-time at the [employer] in [place]. - We have repeated blood work on 04/13/2019, total bilirubin was 2.8 and direct bilirubin was 0.2.  Hemoglobin was 11.3.  LDH was 276.  Reticulocyte count was 5%. - Direct Coombs test was negative for IgG and complement. -At last visit hemoglobin improved to 12.6.  LDH improved to 236. -I have reviewed blood work from today.  Hemoglobin is 12.4.  LDH has normalized.  Total bilirubin improved to 1.8. -Differential diagnosis includes Gilbert's syndrome. -We have discontinued Lexapro on 04/17/2019. - I have recommended her to be off of Lexapro.  I will see her back in 4 weeks for follow-up with repeat blood work.

## 2019-04-22 NOTE — Patient Instructions (Addendum)
[facility] at [facility] Discharge Instructions  You were seen today by Dr. Ellin Saba. He went over your recent lab results. He would like you to get labs today. He will see you back once he knows what your labs are for labs and follow up.   Thank you for choosing [facility] at [facility] to provide your oncology and hematology care.  To afford each patient quality time with our provider, please arrive at least 15 minutes before your scheduled appointment time.   If you have a lab appointment with the Cancer Center please come in thru the  Main Entrance and check in at the main information desk  You need to re-schedule your appointment should you arrive 10 or more minutes late.  We strive to give you quality time with our providers, and arriving late affects you and other patients whose appointments are after yours.  Also, if you no show three or more times for appointments you may be dismissed from the clinic at the providers discretion.     Again, thank you for choosing [facility].  Our hope is that these requests will decrease the amount of time that you wait before being seen by our physicians.       _____________________________________________________________  Should you have questions after your visit to [facility], please contact our office at [phone] between the hours of 8:00 a.m. and 4:30 p.m.  Voicemails left after 4:00 p.m. will not be returned until the following business day.  For prescription refill requests, have your pharmacy contact our office and allow 72 hours.    Cancer Center Support Programs:   > Cancer Support Group  2nd Tuesday of the month 1pm-2pm, Journey Room

## 2019-04-24 LAB — MISC LABCORP TEST (SEND OUT)
LabCorp test name: NEGATIVE
Labcorp test code: 9985

## 2019-06-19 ENCOUNTER — Other Ambulatory Visit: Payer: Self-pay

## 2019-06-19 ENCOUNTER — Inpatient Hospital Stay (HOSPITAL_COMMUNITY): Payer: Commercial Managed Care - PPO | Attending: Hematology

## 2019-06-19 ENCOUNTER — Other Ambulatory Visit (HOSPITAL_COMMUNITY): Payer: Self-pay

## 2019-06-19 DIAGNOSIS — K219 Gastro-esophageal reflux disease without esophagitis: Secondary | ICD-10-CM | POA: Insufficient documentation

## 2019-06-19 DIAGNOSIS — F419 Anxiety disorder, unspecified: Secondary | ICD-10-CM | POA: Diagnosis not present

## 2019-06-19 DIAGNOSIS — Z7982 Long term (current) use of aspirin: Secondary | ICD-10-CM | POA: Diagnosis not present

## 2019-06-19 DIAGNOSIS — Z79899 Other long term (current) drug therapy: Secondary | ICD-10-CM | POA: Insufficient documentation

## 2019-06-19 DIAGNOSIS — Z793 Long term (current) use of hormonal contraceptives: Secondary | ICD-10-CM | POA: Diagnosis not present

## 2019-06-19 DIAGNOSIS — R5383 Other fatigue: Secondary | ICD-10-CM | POA: Diagnosis not present

## 2019-06-19 DIAGNOSIS — F329 Major depressive disorder, single episode, unspecified: Secondary | ICD-10-CM | POA: Diagnosis not present

## 2019-06-19 DIAGNOSIS — D509 Iron deficiency anemia, unspecified: Secondary | ICD-10-CM | POA: Diagnosis not present

## 2019-06-19 DIAGNOSIS — D6862 Lupus anticoagulant syndrome: Secondary | ICD-10-CM

## 2019-06-19 LAB — IRON AND TIBC
Iron: 92 ug/dL (ref 28–170)
Saturation Ratios: 28 % (ref 10.4–31.8)
TIBC: 324 ug/dL (ref 250–450)
UIBC: 232 ug/dL

## 2019-06-19 LAB — FERRITIN: Ferritin: 14 ng/mL (ref 11–307)

## 2019-06-20 LAB — LUPUS ANTICOAGULANT PANEL
DRVVT: 41.4 s (ref 0.0–47.0)
PTT Lupus Anticoagulant: 36.8 s (ref 0.0–51.9)

## 2019-06-25 ENCOUNTER — Encounter (HOSPITAL_COMMUNITY): Payer: Self-pay | Admitting: Hematology

## 2019-06-25 ENCOUNTER — Other Ambulatory Visit: Payer: Self-pay

## 2019-06-25 ENCOUNTER — Ambulatory Visit (HOSPITAL_COMMUNITY): Payer: Commercial Managed Care - PPO | Admitting: Hematology

## 2019-06-25 ENCOUNTER — Inpatient Hospital Stay (HOSPITAL_BASED_OUTPATIENT_CLINIC_OR_DEPARTMENT_OTHER): Payer: Commercial Managed Care - PPO | Admitting: Hematology

## 2019-06-25 DIAGNOSIS — F329 Major depressive disorder, single episode, unspecified: Secondary | ICD-10-CM

## 2019-06-25 DIAGNOSIS — F419 Anxiety disorder, unspecified: Secondary | ICD-10-CM

## 2019-06-25 DIAGNOSIS — K219 Gastro-esophageal reflux disease without esophagitis: Secondary | ICD-10-CM | POA: Diagnosis not present

## 2019-06-25 DIAGNOSIS — Z79899 Other long term (current) drug therapy: Secondary | ICD-10-CM

## 2019-06-25 DIAGNOSIS — R5383 Other fatigue: Secondary | ICD-10-CM | POA: Diagnosis not present

## 2019-06-25 DIAGNOSIS — D509 Iron deficiency anemia, unspecified: Secondary | ICD-10-CM

## 2019-06-25 DIAGNOSIS — Z793 Long term (current) use of hormonal contraceptives: Secondary | ICD-10-CM

## 2019-06-25 DIAGNOSIS — D508 Other iron deficiency anemias: Secondary | ICD-10-CM

## 2019-06-25 DIAGNOSIS — Z7982 Long term (current) use of aspirin: Secondary | ICD-10-CM

## 2019-06-25 NOTE — Progress Notes (Signed)
[facility] [address] [place], [place] [address]   CLINIC:  Medical Oncology/Hematology  PCP:  Carol Bernard, [place] [address] [place] [id number] [phone]   REASON FOR VISIT:  Follow-up for Hyperbilirubemia  CURRENT THERAPY: Clinical surveillance     INTERVAL HISTORY:  [name] 24 y.o. female presents today for follow up. She reports overall doing well. Her main concern is fatigue. She states she has lack of energy. Reports appetite is adequate. Denies any obvious signs of bleeding. She denies any fevers, chills, or night sweats. She is here to discuss recent lab results.    REVIEW OF SYSTEMS:  Review of Systems  Constitutional: Positive for fatigue.  HENT:  Negative.   Eyes: Negative.   Respiratory: Negative.   Cardiovascular: Negative.   Gastrointestinal: Negative.   Endocrine: Negative.   Genitourinary: Negative.    Musculoskeletal: Negative.   Skin: Negative.   Neurological: Negative.   Hematological: Negative.   Psychiatric/Behavioral: Negative.      PAST MEDICAL/SURGICAL HISTORY:  Past Medical History:  Diagnosis Date  . Allergy   . Anxiety and depression   . Asthma   . Birth defect   . Blood transfusion without reported diagnosis   . Clotting disorder (HCC)   . Complication of anesthesia    'I had stridor after my 1st D&C but with my 2nd one they gave me a breathing treatment before my surgery and I did ok".  . Cyst of left kidney    frequent cysts to kidneys  . Dysrhythmia    h/o SVT  . Family history of adverse reaction to anesthesia   . GERD (gastroesophageal reflux disease)   . Headache   . History of recurrent miscarriages   . Hypoglycemia   . Lupus anticoagulant positive   . Neuropathic pain   . Palpitations    Past Surgical History:  Procedure Laterality Date  . CESAREAN SECTION    . CHOLECYSTECTOMY N/A 07/23/2018   Procedure: LAPAROSCOPIC CHOLECYSTECTOMY;  Surgeon: Carol RoersBridges, Lindsay C, MD;  Location: [facility];   Service: General;  Laterality: N/A;  . DILATION AND CURETTAGE OF UTERUS N/A 01/29/2017   Procedure: SUCTION DILATATION AND CURETTAGE;  Surgeon: Tilda BurrowJohn Ferguson V, MD;  Location: [facility];  Service: Gynecology;  Laterality: N/A;  . DILATION AND CURETTAGE OF UTERUS N/A 05/01/2017   Procedure: SUCTION DILATATION AND CURETTAGE;  Surgeon: Lazaro ArmsLuther H Eure, MD;  Location: [facility];  Service: Gynecology;  Laterality: N/A;  pt to arrive at 7:15 to have labwork  . FETOSCOPIC LASER PHOTOCOAGULATION  11/14/2017   of 34 placental vessels for TTTS  . WISDOM TOOTH EXTRACTION       SOCIAL HISTORY:  Social History   Socioeconomic History  . Marital status: Single    Spouse name: Not on file  . Number of children: Not on file  . Years of education: Not on file  . Highest education level: Not on file  Occupational History  . Occupation: Loss adjuster, charteredneonatal secretary  . Occupation: Psychologist, sport and exercisenurse tech  Social Needs  . Financial resource strain: Not on file  . Food insecurity    Worry: Not on file    Inability: Not on file  . Transportation needs    Medical: Not on file    Non-medical: Not on file  Tobacco Use  . Smoking status: Never Smoker  . Smokeless tobacco: Never Used  Substance and Sexual Activity  . Alcohol use: No    Alcohol/week: 0.0 standard drinks  .  Drug use: No  . Sexual activity: Not Currently    Birth control/protection: None  Lifestyle  . Physical activity    Days per week: Not on file    Minutes per session: Not on file  . Stress: Not on file  Relationships  . Social Musicianconnections    Talks on phone: Not on file    Gets together: Not on file    Attends religious service: Not on file    Active member of club or organization: Not on file    Attends meetings of clubs or organizations: Not on file    Relationship status: Not on file  . Intimate partner violence    Fear of current or ex partner: Not on file    Emotionally abused: Not on file    Physically abused: Not on file    Forced sexual activity:  Not on file  Other Topics Concern  . Not on file  Social History Narrative  . Not on file    FAMILY HISTORY:  Family History  Adopted: Yes  Problem Relation Age of Onset  . Migraines Sister   . Hypertension Maternal Grandmother   . Skin cancer Maternal Grandmother   . Heart disease Maternal Grandfather     CURRENT MEDICATIONS:  Outpatient Encounter Medications as of 06/25/2019  Medication Sig Note  . acetaminophen (TYLENOL) 500 MG tablet Take 500 mg by mouth every 6 (six) hours as needed for moderate pain, fever or headache.   . albuterol (VENTOLIN HFA) 108 (90 Base) MCG/ACT inhaler Inhale 2 puffs into the lungs every 6 (six) hours as needed for wheezing or shortness of breath.   Marland Kitchen. aspirin EC 81 MG tablet Take 81 mg by mouth every 4 (four) hours as needed.   . colestipol (COLESTID) 1 g tablet    . escitalopram (LEXAPRO) 20 MG tablet Take 20 mg by mouth daily.   Marland Kitchen. gabapentin (NEURONTIN) 300 MG capsule TAKE 1 CAPSULE BY MOUTH THREE TIMES A DAY (Patient taking differently: Take 300 mg by mouth 2 (two) times daily. )   . guaiFENesin-codeine 100-10 MG/5ML syrup Take 5 mLs by mouth as needed.    . lidocaine (LIDODERM) 5 % Place 1 patch onto the skin daily as needed (back pain). May wear up to 12 hours   . LORazepam (ATIVAN) 1 MG tablet Take 0.5-1 tablets (0.5-1 mg total) by mouth every 8 (eight) hours as needed for anxiety. 04/01/2019: Pt has not needed it for a few months  . medroxyPROGESTERone (DEPO-PROVERA) 150 MG/ML injection Inject 1 mL (150 mg total) into the muscle every 3 (three) months.   . montelukast (SINGULAIR) 10 MG tablet    . topiramate (TOPAMAX) 100 MG tablet Take 100 mg by mouth at bedtime.   Marland Kitchen. zolpidem (AMBIEN) 5 MG tablet Take 1 tablet (5 mg total) by mouth at bedtime as needed for sleep. (Patient not taking: Reported on 04/17/2019)    No facility-administered encounter medications on file as of 06/25/2019.     ALLERGIES:  Allergies  Allergen Reactions  . Pecan Nut  (Diagnostic) Hives  . Reglan [Metoclopramide] Hives     PHYSICAL EXAM:  ECOG Performance status: 0  Vitals:   06/25/19 0800  BP: (!) 103/43  Pulse: 92  Resp: 16  Temp: 98.8 F (37.1 C)  SpO2: 100%   Filed Weights   06/25/19 0800  Weight: 134 lb 3 oz (60.9 kg)    Physical Exam Constitutional:      Appearance: Normal appearance.  HENT:  Head: Normocephalic.     Nose: Nose normal.     Mouth/Throat:     Mouth: Mucous membranes are moist.     Pharynx: Oropharynx is clear.  Eyes:     Extraocular Movements: Extraocular movements intact.     Conjunctiva/sclera: Conjunctivae normal.  Neck:     Musculoskeletal: Normal range of motion.  Cardiovascular:     Rate and Rhythm: Normal rate and regular rhythm.     Pulses: Normal pulses.     Heart sounds: Normal heart sounds.  Pulmonary:     Effort: Pulmonary effort is normal.     Breath sounds: Normal breath sounds.  Abdominal:     General: Bowel sounds are normal.     Palpations: Abdomen is soft.  Musculoskeletal: Normal range of motion.  Skin:    General: Skin is warm.  Neurological:     General: No focal deficit present.     Mental Status: She is alert and oriented to person, place, and time.  Psychiatric:        Mood and Affect: Mood normal.        Behavior: Behavior normal.        Thought Content: Thought content normal.        Judgment: Judgment normal.      LABORATORY DATA:  I have reviewed the labs as listed.  CBC    Component Value Date/Time   WBC 5.2 04/22/2019 1218   RBC 4.32 04/22/2019 1218   RBC 4.32 04/22/2019 1218   HGB 12.4 04/22/2019 1218   HGB 12.1 04/28/2018 1611   HCT 38.3 04/22/2019 1218   HCT 35.5 04/28/2018 1611   PLT 176 04/22/2019 1218   PLT 187 04/28/2018 1611   MCV 88.7 04/22/2019 1218   MCV 83 04/28/2018 1611   MCH 28.7 04/22/2019 1218   MCHC 32.4 04/22/2019 1218   RDW 14.9 04/22/2019 1218   RDW 14.0 04/28/2018 1611   LYMPHSABS 1.9 04/22/2019 1218   LYMPHSABS 1.4  04/28/2018 1611   MONOABS 0.3 04/22/2019 1218   EOSABS 0.1 04/22/2019 1218   EOSABS 0.1 04/28/2018 1611   BASOSABS 0.0 04/22/2019 1218   BASOSABS 0.0 04/28/2018 1611   CMP Latest Ref Rng & Units 04/22/2019 04/13/2019 04/09/2019  Glucose 70 - 99 mg/dL - 92 78  BUN 6 - 20 mg/dL - 13 7  Creatinine 0.44 - 1.00 mg/dL - 0.54 0.66  Sodium 135 - 145 mmol/L - 138 139  Potassium 3.5 - 5.1 mmol/L - 4.5 3.5  Chloride 98 - 111 mmol/L - 106 105  CO2 22 - 32 mmol/L - 26 28  Calcium 8.9 - 10.3 mg/dL - 8.9 9.1  Total Protein 6.5 - 8.1 g/dL 7.9 7.4 7.2  Total Bilirubin 0.3 - 1.2 mg/dL 1.8(H) 2.8(H) 2.7(H)  Alkaline Phos 38 - 126 U/L 44 54 58  AST 15 - 41 U/L 20 28 19   ALT 0 - 44 U/L 22 26 17        ASSESSMENT & PLAN:   Iron deficiency anemia 1.  Indirect hyperbilirubinemia: - Patient reportedly had icteric sclera recently.  Her total bilirubin on 04/01/2019 was elevated at 2.7. - Her hemoglobin on 04/13/2027 dropped to 11.9.  Total bilirubin was 2.7 and direct bilirubin was 0.4.  She was evaluated by Dr. Tarri Glenn from GI and was referred to [place] for further evaluation. -She denies taking any new medications including antibiotics.  She denies any over-the-counter herbal supplements.  She takes elderberry extract. - Denies any recent history of  blood transfusions.  She had blood transfusion when she was pregnant about 1 and half year ago. - Denies any fevers or night sweats.  She is working full-time at the [employer] in [place]. - We have repeated blood work on 04/13/2019, total bilirubin was 2.8 and direct bilirubin was 0.2.  Hemoglobin was 11.3.  LDH was 276.  Reticulocyte count was 5%. - Direct Coombs test was negative for IgG and complement. -Differential diagnosis includes Gilbert's syndrome. -We have discontinued Lexapro on 04/17/2019. - I have recommended her to be off of Lexapro.  I will see her back in 4 weeks for follow-up with repeat blood work. -Most recent labs reveal hemoglobin of  12.4, MCV 88.7, lupus anticoagulant was negative.  Ferritin low normal at 14, iron 92, TIBC, 224, saturation 28%,  UIBC 232.  Based on patient's level of fatigue and her low ferritin level, have recommended patient proceed with IV Feraheme 510 mg x 2 doses 1 week apart.  We discussed side effects of IV Feraheme including but not limited to severe anaphylactic reaction.  Patient is aware of side effects and has agreed to proceed with treatment. -We will also plan to repeat her labs in 8 weeks. -Return to clinic as scheduled.      Orders placed this encounter:  Orders Placed This Encounter  Procedures  . CBC with Differential  . Comprehensive metabolic panel  . Iron and TIBC  . Vitamin B12  . Ferritin  . Folate     Jinny SandersKim R Rio Taber, FNP [facility] [phone]

## 2019-06-25 NOTE — Assessment & Plan Note (Signed)
1.  Indirect hyperbilirubinemia: - Patient reportedly had icteric sclera recently.  Her total bilirubin on 04/01/2019 was elevated at 2.7. - Her hemoglobin on 04/13/2027 dropped to 11.9.  Total bilirubin was 2.7 and direct bilirubin was 0.4.  She was evaluated by Dr. Tarri Glenn from GI and was referred to [place] for further evaluation. -She denies taking any new medications including antibiotics.  She denies any over-the-counter herbal supplements.  She takes elderberry extract. - Denies any recent history of blood transfusions.  She had blood transfusion when she was pregnant about 1 and half year ago. - Denies any fevers or night sweats.  She is working full-time at the [employer] in [place]. - We have repeated blood work on 04/13/2019, total bilirubin was 2.8 and direct bilirubin was 0.2.  Hemoglobin was 11.3.  LDH was 276.  Reticulocyte count was 5%. - Direct Coombs test was negative for IgG and complement. -Differential diagnosis includes Gilbert's syndrome. -We have discontinued Lexapro on 04/17/2019. - I have recommended her to be off of Lexapro.  I will see her back in 4 weeks for follow-up with repeat blood work. -Most recent labs reveal hemoglobin of 12.4, MCV 88.7, lupus anticoagulant was negative.  Ferritin low normal at 14, iron 92, TIBC, 224, saturation 28%,  UIBC 232.  Based on patient's level of fatigue and her low ferritin level, have recommended patient proceed with IV Feraheme 510 mg x 2 doses 1 week apart.  We discussed side effects of IV Feraheme including but not limited to severe anaphylactic reaction.  Patient is aware of side effects and has agreed to proceed with treatment. -We will also plan to repeat her labs in 8 weeks. -Return to clinic as scheduled.

## 2019-06-26 ENCOUNTER — Ambulatory Visit (HOSPITAL_COMMUNITY): Payer: Commercial Managed Care - PPO | Admitting: Hematology

## 2019-06-30 ENCOUNTER — Inpatient Hospital Stay (HOSPITAL_COMMUNITY): Payer: Commercial Managed Care - PPO

## 2019-06-30 ENCOUNTER — Other Ambulatory Visit: Payer: Self-pay

## 2019-06-30 ENCOUNTER — Encounter (HOSPITAL_COMMUNITY): Payer: Self-pay

## 2019-06-30 VITALS — BP 107/61 | HR 92 | Temp 99.0°F | Resp 17 | Wt 136.2 lb

## 2019-06-30 DIAGNOSIS — D509 Iron deficiency anemia, unspecified: Secondary | ICD-10-CM | POA: Diagnosis not present

## 2019-06-30 DIAGNOSIS — D508 Other iron deficiency anemias: Secondary | ICD-10-CM

## 2019-06-30 MED ORDER — SODIUM CHLORIDE 0.9 % IV SOLN
Freq: Once | INTRAVENOUS | Status: AC
  Administered 2019-06-30: 14:00:00 via INTRAVENOUS

## 2019-06-30 MED ORDER — SODIUM CHLORIDE 0.9 % IV SOLN
200.0000 mg | Freq: Once | INTRAVENOUS | Status: AC
  Administered 2019-06-30: 200 mg via INTRAVENOUS
  Filled 2019-06-30: qty 10

## 2019-06-30 NOTE — Progress Notes (Signed)
Pt presents today for Venofer infusion. VSS. Pt has no complaints of any significant changes since the last visit. MAR reviewed and updated.   Venofer given today per MD orders. Tolerated infusion without adverse affects. Vital signs stable. No complaints at this time. Discharged from clinic ambulatory. F/U with [facility] as scheduled.

## 2019-06-30 NOTE — Patient Instructions (Signed)
[facility] at [facility]  Discharge Instructions:   _______________________________________________________________  Thank you for choosing [facility] at [facility] to provide your oncology and hematology care.  To afford each patient quality time with our providers, please arrive at least 15 minutes before your scheduled appointment.  You need to re-schedule your appointment if you arrive 10 or more minutes late.  We strive to give you quality time with our providers, and arriving late affects you and other patients whose appointments are after yours.  Also, if you no show three or more times for appointments you may be dismissed from the clinic.  Again, thank you for choosing [facility] at [facility]. Our hope is that these requests will allow you access to exceptional care and in a timely manner. _______________________________________________________________  If you have questions after your visit, please contact our office at [phone] between the hours of 8:30 a.m. and 5:00 p.m. Voicemails left after 4:30 p.m. will not be returned until the following business day. _______________________________________________________________  For prescription refill requests, have your pharmacy contact our office. _______________________________________________________________  Recommendations made by the consultant and any test results will be sent to your referring physician. _______________________________________________________________ 

## 2019-07-09 ENCOUNTER — Other Ambulatory Visit: Payer: Self-pay

## 2019-07-09 ENCOUNTER — Inpatient Hospital Stay (HOSPITAL_COMMUNITY): Payer: Commercial Managed Care - PPO | Attending: Nurse Practitioner

## 2019-07-09 ENCOUNTER — Encounter (HOSPITAL_COMMUNITY): Payer: Self-pay

## 2019-07-09 VITALS — BP 116/71 | HR 89 | Temp 98.7°F | Resp 18

## 2019-07-09 DIAGNOSIS — D508 Other iron deficiency anemias: Secondary | ICD-10-CM | POA: Diagnosis not present

## 2019-07-09 MED ORDER — SODIUM CHLORIDE 0.9 % IV SOLN
200.0000 mg | Freq: Once | INTRAVENOUS | Status: AC
  Administered 2019-07-09: 200 mg via INTRAVENOUS
  Filled 2019-07-09: qty 10

## 2019-07-09 MED ORDER — SODIUM CHLORIDE 0.9 % IV SOLN
Freq: Once | INTRAVENOUS | Status: AC
  Administered 2019-07-09: 15:00:00 via INTRAVENOUS

## 2019-07-09 NOTE — Patient Instructions (Signed)
[place] at [facility] Discharge Instructions  Received Venofer infusion today. Follow-up as scheduled. Call clinic for any questions or concerns   Thank you for choosing [place] at [facility] to provide your oncology and hematology care.  To afford each patient quality time with our provider, please arrive at least 15 minutes before your scheduled appointment time.   If you have a lab appointment with the [facility] please come in thru the  Main Entrance and check in at the main information desk  You need to re-schedule your appointment should you arrive 10 or more minutes late.  We strive to give you quality time with our providers, and arriving late affects you and other patients whose appointments are after yours.  Also, if you no show three or more times for appointments you may be dismissed from the clinic at the providers discretion.     Again, [facility]  Our hope is that these requests will decrease the amount of time that you wait before being seen by our physicians.       _____________________________________________________________  Should you have questions after your visit to [facility], please contact our office at [phone] between the hours of 8:00 a.m. and 4:30 p.m.  Voicemails left after 4:00 p.m. will not be returned until the following business day.  For prescription refill requests, have your pharmacy contact our office and allow 72 hours.    Cancer Center Support Programs:   > Cancer Support Group  2nd Tuesday of the month 1pm-2pm, Journey Room

## 2019-07-09 NOTE — Progress Notes (Signed)
Arthor Captain Wood tolerated Venofer infusion well without complaints or incident. Peripheral IV site checked with positive blood return noted prior to and after infusion. VSS upon discharge. Pt discharged self ambulatory in satisfactory condition

## 2019-07-28 ENCOUNTER — Other Ambulatory Visit: Payer: Self-pay

## 2019-07-28 ENCOUNTER — Inpatient Hospital Stay (HOSPITAL_COMMUNITY): Payer: Commercial Managed Care - PPO

## 2019-07-28 VITALS — BP 97/62 | HR 69 | Temp 98.4°F | Resp 18

## 2019-07-28 DIAGNOSIS — D508 Other iron deficiency anemias: Secondary | ICD-10-CM

## 2019-07-28 MED ORDER — SODIUM CHLORIDE 0.9 % IV SOLN
Freq: Once | INTRAVENOUS | Status: AC
  Administered 2019-07-28: 14:00:00 via INTRAVENOUS

## 2019-07-28 MED ORDER — SODIUM CHLORIDE 0.9 % IV SOLN
200.0000 mg | Freq: Once | INTRAVENOUS | Status: AC
  Administered 2019-07-28: 200 mg via INTRAVENOUS
  Filled 2019-07-28: qty 10

## 2019-07-28 NOTE — Progress Notes (Signed)
Iron infusion today per MD.Patient tolerated it well without problems. Vitals stable and discharged home from clinic ambulatory. Follow up as scheduled.

## 2019-08-05 ENCOUNTER — Inpatient Hospital Stay (HOSPITAL_COMMUNITY): Payer: Commercial Managed Care - PPO | Attending: Nurse Practitioner

## 2019-08-05 ENCOUNTER — Other Ambulatory Visit: Payer: Self-pay

## 2019-08-05 VITALS — BP 103/57 | HR 74 | Temp 97.6°F | Resp 18

## 2019-08-05 DIAGNOSIS — D500 Iron deficiency anemia secondary to blood loss (chronic): Secondary | ICD-10-CM | POA: Insufficient documentation

## 2019-08-05 DIAGNOSIS — Z808 Family history of malignant neoplasm of other organs or systems: Secondary | ICD-10-CM | POA: Insufficient documentation

## 2019-08-05 DIAGNOSIS — Z8249 Family history of ischemic heart disease and other diseases of the circulatory system: Secondary | ICD-10-CM | POA: Diagnosis not present

## 2019-08-05 DIAGNOSIS — R51 Headache: Secondary | ICD-10-CM | POA: Diagnosis not present

## 2019-08-05 DIAGNOSIS — D508 Other iron deficiency anemias: Secondary | ICD-10-CM

## 2019-08-05 DIAGNOSIS — R0789 Other chest pain: Secondary | ICD-10-CM | POA: Insufficient documentation

## 2019-08-05 DIAGNOSIS — R000 Tachycardia, unspecified: Secondary | ICD-10-CM | POA: Diagnosis not present

## 2019-08-05 DIAGNOSIS — Z79899 Other long term (current) drug therapy: Secondary | ICD-10-CM | POA: Insufficient documentation

## 2019-08-05 DIAGNOSIS — N920 Excessive and frequent menstruation with regular cycle: Secondary | ICD-10-CM | POA: Diagnosis present

## 2019-08-05 MED ORDER — SODIUM CHLORIDE 0.9 % IV SOLN
200.0000 mg | Freq: Once | INTRAVENOUS | Status: AC
  Administered 2019-08-05: 200 mg via INTRAVENOUS
  Filled 2019-08-05: qty 10

## 2019-08-05 MED ORDER — SODIUM CHLORIDE 0.9 % IV SOLN
Freq: Once | INTRAVENOUS | Status: AC
  Administered 2019-08-05: 14:00:00 via INTRAVENOUS

## 2019-08-05 MED ORDER — SODIUM CHLORIDE 0.9% FLUSH
10.0000 mL | Freq: Once | INTRAVENOUS | Status: AC | PRN
  Administered 2019-08-05: 10 mL

## 2019-08-05 NOTE — Progress Notes (Signed)
Iron transfusion given today per orders.  Patient tolerated it well without problems. Vitals stable and discharged home from clinic ambulatory. Follow up as scheduled.

## 2019-08-05 NOTE — Patient Instructions (Signed)
[facility] at [facility]  Discharge Instructions:   _______________________________________________________________  Thank you for choosing [facility] at [facility] to provide your oncology and hematology care.  To afford each patient quality time with our providers, please arrive at least 15 minutes before your scheduled appointment.  You need to re-schedule your appointment if you arrive 10 or more minutes late.  We strive to give you quality time with our providers, and arriving late affects you and other patients whose appointments are after yours.  Also, if you no show three or more times for appointments you may be dismissed from the clinic.  Again, thank you for choosing  [facility] at [facility]. Our hope is that these requests will allow you access to exceptional care and in a timely manner. _______________________________________________________________  If you have questions after your visit, please contact our office at [phone] between the hours of 8:30 a.m. and 5:00 p.m. Voicemails left after 4:30 p.m. will not be returned until the following business day. _______________________________________________________________  For prescription refill requests, have your pharmacy contact our office. _______________________________________________________________  Recommendations made by the consultant and any test results will be sent to your referring physician. _______________________________________________________________ 

## 2019-08-18 ENCOUNTER — Other Ambulatory Visit (HOSPITAL_COMMUNITY): Payer: Self-pay | Admitting: *Deleted

## 2019-08-18 DIAGNOSIS — D508 Other iron deficiency anemias: Secondary | ICD-10-CM

## 2019-08-19 ENCOUNTER — Other Ambulatory Visit: Payer: Self-pay

## 2019-08-19 ENCOUNTER — Inpatient Hospital Stay (HOSPITAL_COMMUNITY): Payer: Commercial Managed Care - PPO

## 2019-08-19 DIAGNOSIS — D500 Iron deficiency anemia secondary to blood loss (chronic): Secondary | ICD-10-CM | POA: Diagnosis not present

## 2019-08-19 DIAGNOSIS — D508 Other iron deficiency anemias: Secondary | ICD-10-CM

## 2019-08-19 LAB — IRON AND TIBC
Iron: 103 ug/dL (ref 28–170)
Saturation Ratios: 33 % — ABNORMAL HIGH (ref 10.4–31.8)
TIBC: 313 ug/dL (ref 250–450)
UIBC: 210 ug/dL

## 2019-08-19 LAB — FERRITIN: Ferritin: 179 ng/mL (ref 11–307)

## 2019-08-25 ENCOUNTER — Ambulatory Visit (HOSPITAL_COMMUNITY): Payer: Commercial Managed Care - PPO | Admitting: Hematology

## 2019-08-25 ENCOUNTER — Encounter (HOSPITAL_COMMUNITY): Payer: Self-pay | Admitting: Hematology

## 2019-08-25 ENCOUNTER — Other Ambulatory Visit: Payer: Self-pay

## 2019-08-25 ENCOUNTER — Inpatient Hospital Stay (HOSPITAL_BASED_OUTPATIENT_CLINIC_OR_DEPARTMENT_OTHER): Payer: Commercial Managed Care - PPO | Admitting: Hematology

## 2019-08-25 VITALS — BP 116/77 | HR 79 | Temp 97.7°F | Resp 18 | Wt 135.5 lb

## 2019-08-25 DIAGNOSIS — D508 Other iron deficiency anemias: Secondary | ICD-10-CM

## 2019-08-25 DIAGNOSIS — D500 Iron deficiency anemia secondary to blood loss (chronic): Secondary | ICD-10-CM | POA: Diagnosis not present

## 2019-08-25 NOTE — Patient Instructions (Addendum)
[facility] at [facility] Discharge Instructions  You were seen today by Dr. Katragadda. He went over your recent lab results. He will see you back in 4 months for labs and follow up.   Thank you for choosing [facility] at [facility] to provide your oncology and hematology care.  To afford each patient quality time with our provider, please arrive at least 15 minutes before your scheduled appointment time.   If you have a lab appointment with the Cancer Center please come in thru the  Main Entrance and check in at the main information desk  You need to re-schedule your appointment should you arrive 10 or more minutes late.  We strive to give you quality time with our providers, and arriving late affects you and other patients whose appointments are after yours.  Also, if you no show three or more times for appointments you may be dismissed from the clinic at the providers discretion.     Again, thank you for choosing [facility].  Our hope is that these requests will decrease the amount of time that you wait before being seen by our physicians.       _____________________________________________________________  Should you have questions after your visit to [facility], please contact our office at [phone] between the hours of 8:00 a.m. and 4:30 p.m.  Voicemails left after 4:00 p.m. will not be returned until the following business day.  For prescription refill requests, have your pharmacy contact our office and allow 72 hours.    Cancer Center Support Programs:   > Cancer Support Group  2nd Tuesday of the month 1pm-2pm, Journey Room    

## 2019-08-25 NOTE — Assessment & Plan Note (Addendum)
1.  Iron deficiency state: - Ferritin was low on 06/19/1999 2814. -Last menstrual period was 2 to 3 months ago.  She has been on Depo-Provera shots for birth control. - She received 4 infusions of Venofer, last one on 08/05/2019. -Her energy levels improved.  Ferritin is 179.  Percent saturation is 33.  No intervention necessary at this time. -We will follow her in 4 months with repeat labs.  2.  Indirect hyperbilirubinemia: - She was found to have elevated total bilirubin on 04/01/2019 at 2.7. - LDH was mildly elevated.  It was predominantly indirect bilirubin.  Direct Coombs test was negative for IgG and complement. - DAT negative hemolytic anemia evaluation at [facility] lab was also negative.  Last total bilirubin was 1.8 in April. - Differential diagnosis includes Gilbert's syndrome.  We will plan to repeat it at next visit.  3.  Right chest wall pain: -She reported right parasternal chest pain for the last couple of weeks.  It has no correlation to movements, breathing or time.  No tenderness on exam. - She was reportedly evaluated by cardiology for tachycardia.  She reported that she would call them if it does not improve in 2 weeks.

## 2019-08-25 NOTE — Progress Notes (Signed)
[facility] [address] , [place] [address]   CLINIC:  Medical Oncology/Hematology  PCP:  Lawerance SabalWorley, Carol, [place] [address] [place] [address] [phone]   REASON FOR VISIT:  Iron deficiency state.  CURRENT THERAPY:Venofer infusion.    INTERVAL HISTORY:  [name] 24 y.o. female seen for follow-up of iron deficiency state.  She reported receiving 4 infusions of Venofer, last one on 08/05/2019.  She reported her energy levels have improved after the iron infusion.  She reported nonspecific right-sided chest pain which comes and goes and lasts only a few minutes.  No correlation with certain movements or breathing.  Appetite is 75%.  Energy levels are 50%.  Occasional headaches have been stable.  Last menstrual period was 2 to 3 months ago.  She has been on Depo-Provera injections for birth control.  REVIEW OF SYSTEMS:  Review of Systems  Cardiovascular: Positive for chest pain.  Neurological: Positive for headaches.  All other systems reviewed and are negative.    PAST MEDICAL/SURGICAL HISTORY:  Past Medical History:  Diagnosis Date  . Allergy   . Anxiety and depression   . Asthma   . Birth defect   . Blood transfusion without reported diagnosis   . Clotting disorder (HCC)   . Complication of anesthesia    'I had stridor after my 1st D&C but with my 2nd one they gave me a breathing treatment before my surgery and I did ok".  . Cyst of left kidney    frequent cysts to kidneys  . Dysrhythmia    h/o SVT  . Family history of adverse reaction to anesthesia   . GERD (gastroesophageal reflux disease)   . Headache   . History of recurrent miscarriages   . Hypoglycemia   . Lupus anticoagulant positive   . Neuropathic pain   . Palpitations    Past Surgical History:  Procedure Laterality Date  . CESAREAN SECTION    . CHOLECYSTECTOMY N/A 07/23/2018   Procedure: LAPAROSCOPIC CHOLECYSTECTOMY;  Surgeon: Carol RoersBridges, Carol C, MD;  Location: [facility];   Service: General;  Laterality: N/A;  . DILATION AND CURETTAGE OF UTERUS N/A 01/29/2017   Procedure: SUCTION DILATATION AND CURETTAGE;  Surgeon: Carol BurrowJohn Ferguson V, MD;  Location: [facility];  Service: Gynecology;  Laterality: N/A;  . DILATION AND CURETTAGE OF UTERUS N/A 05/01/2017   Procedure: SUCTION DILATATION AND CURETTAGE;  Surgeon: Lazaro ArmsLuther H Eure, MD;  Location: [facility];  Service: Gynecology;  Laterality: N/A;  pt to arrive at 7:15 to have labwork  . FETOSCOPIC LASER PHOTOCOAGULATION  11/14/2017   of 34 placental vessels for TTTS  . WISDOM TOOTH EXTRACTION       SOCIAL HISTORY:  Social History   Socioeconomic History  . Marital status: Single    Spouse name: Not on file  . Number of children: Not on file  . Years of education: Not on file  . Highest education level: Not on file  Occupational History  . Occupation: Loss adjuster, charteredneonatal secretary  . Occupation: Psychologist, sport and exercisenurse tech  Social Needs  . Financial resource strain: Not on file  . Food insecurity    Worry: Not on file    Inability: Not on file  . Transportation needs    Medical: Not on file    Non-medical: Not on file  Tobacco Use  . Smoking status: Never Smoker  . Smokeless tobacco: Never Used  Substance and Sexual Activity  . Alcohol use: No    Alcohol/week: 0.0 standard drinks  .  Drug use: No  . Sexual activity: Not Currently    Birth control/protection: None  Lifestyle  . Physical activity    Days per week: Not on file    Minutes per session: Not on file  . Stress: Not on file  Relationships  . Social Musician on phone: Not on file    Gets together: Not on file    Attends religious service: Not on file    Active member of club or organization: Not on file    Attends meetings of clubs or organizations: Not on file    Relationship status: Not on file  . Intimate partner violence    Fear of current or ex partner: Not on file    Emotionally abused: Not on file    Physically abused: Not on file    Forced sexual activity:  Not on file  Other Topics Concern  . Not on file  Social History Narrative  . Not on file    FAMILY HISTORY:  Family History  Adopted: Yes  Problem Relation Age of Onset  . Migraines Sister   . Hypertension Maternal Grandmother   . Skin cancer Maternal Grandmother   . Heart disease Maternal Grandfather     CURRENT MEDICATIONS:  Outpatient Encounter Medications as of 08/25/2019  Medication Sig Note  . acetaminophen (TYLENOL) 500 MG tablet Take 500 mg by mouth every 6 (six) hours as needed for moderate pain, fever or headache.   . albuterol (VENTOLIN HFA) 108 (90 Base) MCG/ACT inhaler Inhale 2 puffs into the lungs every 6 (six) hours as needed for wheezing or shortness of breath.   Marland Kitchen aspirin EC 81 MG tablet Take 81 mg by mouth every 4 (four) hours as needed.   . citalopram (CELEXA) 20 MG tablet    . colestipol (COLESTID) 1 g tablet    . escitalopram (LEXAPRO) 20 MG tablet Take 20 mg by mouth daily.   Marland Kitchen gabapentin (NEURONTIN) 300 MG capsule TAKE 1 CAPSULE BY MOUTH THREE TIMES A DAY (Patient taking differently: Take 300 mg by mouth 2 (two) times daily. )   . guaiFENesin-codeine 100-10 MG/5ML syrup Take 5 mLs by mouth as needed.    . lidocaine (LIDODERM) 5 % Place 1 patch onto the skin daily as needed (back pain). May wear up to 12 hours   . LORazepam (ATIVAN) 1 MG tablet Take 0.5-1 tablets (0.5-1 mg total) by mouth every 8 (eight) hours as needed for anxiety. 04/01/2019: Pt has not needed it for a few months  . medroxyPROGESTERone (DEPO-PROVERA) 150 MG/ML injection Inject 1 mL (150 mg total) into the muscle every 3 (three) months.   . clonazePAM (KLONOPIN) 0.5 MG tablet    . montelukast (SINGULAIR) 10 MG tablet    . zolpidem (AMBIEN) 5 MG tablet Take 1 tablet (5 mg total) by mouth at bedtime as needed for sleep. (Patient not taking: Reported on 08/25/2019)   . [DISCONTINUED] amoxicillin-clavulanate (AUGMENTIN) 875-125 MG tablet     No facility-administered encounter medications on file as  of 08/25/2019.     ALLERGIES:  Allergies  Allergen Reactions  . Pecan Extract Allergy Skin Test Rash and Swelling  . Pecan Nut (Diagnostic) Hives  . Reglan [Metoclopramide] Hives     PHYSICAL EXAM:  ECOG Performance status: 0  Vitals:   08/25/19 1135  BP: 116/77  Pulse: 79  Resp: 18  Temp: 97.7 F (36.5 C)  SpO2: 100%   Filed Weights   08/25/19 1135  Weight:  135 lb 8 oz (61.5 kg)    Physical Exam Vitals signs reviewed.  Constitutional:      Appearance: Normal appearance.  Cardiovascular:     Rate and Rhythm: Normal rate and regular rhythm.     Heart sounds: Normal heart sounds.  Pulmonary:     Effort: Pulmonary effort is normal.     Breath sounds: Normal breath sounds.  Abdominal:     General: There is no distension.     Palpations: Abdomen is soft. There is no mass.  Musculoskeletal:        General: No swelling.  Skin:    General: Skin is warm.  Neurological:     General: No focal deficit present.     Mental Status: She is alert and oriented to person, place, and time.  Psychiatric:        Mood and Affect: Mood normal.        Behavior: Behavior normal.      LABORATORY DATA:  I have reviewed the labs as listed.  CBC    Component Value Date/Time   WBC 5.2 04/22/2019 1218   RBC 4.32 04/22/2019 1218   RBC 4.32 04/22/2019 1218   HGB 12.4 04/22/2019 1218   HGB 12.1 04/28/2018 1611   HCT 38.3 04/22/2019 1218   HCT 35.5 04/28/2018 1611   PLT 176 04/22/2019 1218   PLT 187 04/28/2018 1611   MCV 88.7 04/22/2019 1218   MCV 83 04/28/2018 1611   MCH 28.7 04/22/2019 1218   MCHC 32.4 04/22/2019 1218   RDW 14.9 04/22/2019 1218   RDW 14.0 04/28/2018 1611   LYMPHSABS 1.9 04/22/2019 1218   LYMPHSABS 1.4 04/28/2018 1611   MONOABS 0.3 04/22/2019 1218   EOSABS 0.1 04/22/2019 1218   EOSABS 0.1 04/28/2018 1611   BASOSABS 0.0 04/22/2019 1218   BASOSABS 0.0 04/28/2018 1611   CMP Latest Ref Rng & Units 04/22/2019 04/13/2019 04/09/2019  Glucose 70 - 99 mg/dL - 92  78  BUN 6 - 20 mg/dL - 13 7  Creatinine 1.610.44 - 1.00 mg/dL - 0.960.54 0.450.66  Sodium 409135 - 145 mmol/L - 138 139  Potassium 3.5 - 5.1 mmol/L - 4.5 3.5  Chloride 98 - 111 mmol/L - 106 105  CO2 22 - 32 mmol/L - 26 28  Calcium 8.9 - 10.3 mg/dL - 8.9 9.1  Total Protein 6.5 - 8.1 g/dL 7.9 7.4 7.2  Total Bilirubin 0.3 - 1.2 mg/dL 8.1(X1.8(H) 2.8(H) 2.7(H)  Alkaline Phos 38 - 126 U/L 44 54 58  AST 15 - 41 U/L 20 28 19   ALT 0 - 44 U/L 22 26 17        DIAGNOSTIC IMAGING:  I have independently reviewed the scans and discussed with the patient.   I have reviewed Willy EddyAshley Travis LPN's note and agree with the documentation.  I personally performed a face-to-face visit, made revisions and my assessment and plan is as follows.    ASSESSMENT & PLAN:   Iron deficiency anemia 1.  Iron deficiency state: - Ferritin was low on 06/19/1999 2814. -Last menstrual period was 2 to 3 months ago.  She has been on Depo-Provera shots for birth control. - She received 4 infusions of Venofer, last one on 08/05/2019. -Her energy levels improved.  Ferritin is 179.  Percent saturation is 33.  No intervention necessary at this time. -We will follow her in 4 months with repeat labs.  2.  Indirect hyperbilirubinemia: - She was found to have elevated total bilirubin on 04/01/2019 at  2.7. - LDH was mildly elevated.  It was predominantly indirect bilirubin.  Direct Coombs test was negative for IgG and complement. - DAT negative hemolytic anemia evaluation at [facility] lab was also negative.  Last total bilirubin was 1.8 in April. - Differential diagnosis includes Gilbert's syndrome.  We will plan to repeat it at next visit.  3.  Right chest wall pain: -She reported right parasternal chest pain for the last couple of weeks.  It has no correlation to movements, breathing or time.  No tenderness on exam. - She was reportedly evaluated by cardiology for tachycardia.  She reported that she would call them if it does not improve in 2 weeks.   Total time spent is 25 minutes with more than 50% of the time spent face-to-face discussing further plan, counseling and coordination of care.    Orders placed this encounter:  Orders Placed This Encounter  Procedures  . CBC with Differential/Platelet  . Iron and TIBC  . Ferritin  . Vitamin B12  . Folate  . Hepatic function panel      Derek Jack, MD [place] [phone]

## 2019-09-11 ENCOUNTER — Other Ambulatory Visit: Payer: Self-pay | Admitting: Obstetrics & Gynecology

## 2019-09-11 MED ORDER — MEDROXYPROGESTERONE ACETATE 150 MG/ML IM SUSP: 150.0000 mg | INTRAMUSCULAR | 3 refills | Status: DC

## 2019-12-22 ENCOUNTER — Other Ambulatory Visit (HOSPITAL_COMMUNITY): Payer: Commercial Managed Care - PPO

## 2019-12-24 ENCOUNTER — Inpatient Hospital Stay (HOSPITAL_COMMUNITY): Payer: Commercial Managed Care - PPO | Attending: Hematology

## 2019-12-24 DIAGNOSIS — D508 Other iron deficiency anemias: Secondary | ICD-10-CM

## 2019-12-24 DIAGNOSIS — Z888 Allergy status to other drugs, medicaments and biological substances status: Secondary | ICD-10-CM | POA: Insufficient documentation

## 2019-12-24 DIAGNOSIS — Z79899 Other long term (current) drug therapy: Secondary | ICD-10-CM | POA: Insufficient documentation

## 2019-12-24 DIAGNOSIS — Z8249 Family history of ischemic heart disease and other diseases of the circulatory system: Secondary | ICD-10-CM | POA: Diagnosis not present

## 2019-12-24 DIAGNOSIS — R1011 Right upper quadrant pain: Secondary | ICD-10-CM | POA: Diagnosis not present

## 2019-12-24 DIAGNOSIS — Z793 Long term (current) use of hormonal contraceptives: Secondary | ICD-10-CM | POA: Insufficient documentation

## 2019-12-24 DIAGNOSIS — D509 Iron deficiency anemia, unspecified: Secondary | ICD-10-CM | POA: Insufficient documentation

## 2019-12-24 DIAGNOSIS — R5383 Other fatigue: Secondary | ICD-10-CM | POA: Insufficient documentation

## 2019-12-24 DIAGNOSIS — R000 Tachycardia, unspecified: Secondary | ICD-10-CM | POA: Insufficient documentation

## 2019-12-24 DIAGNOSIS — R7402 Elevation of levels of lactic acid dehydrogenase (LDH): Secondary | ICD-10-CM | POA: Diagnosis not present

## 2019-12-24 DIAGNOSIS — Z808 Family history of malignant neoplasm of other organs or systems: Secondary | ICD-10-CM | POA: Diagnosis not present

## 2019-12-24 DIAGNOSIS — R0789 Other chest pain: Secondary | ICD-10-CM | POA: Insufficient documentation

## 2019-12-24 LAB — CBC WITH DIFFERENTIAL/PLATELET
Abs Immature Granulocytes: 0.02 10*3/uL (ref 0.00–0.07)
Basophils Absolute: 0 10*3/uL (ref 0.0–0.1)
Basophils Relative: 0 %
Eosinophils Absolute: 0 10*3/uL (ref 0.0–0.5)
Eosinophils Relative: 1 %
HCT: 40.5 % (ref 36.0–46.0)
Hemoglobin: 13.6 g/dL (ref 12.0–15.0)
Immature Granulocytes: 0 %
Lymphocytes Relative: 25 %
Lymphs Abs: 1.4 10*3/uL (ref 0.7–4.0)
MCH: 29.7 pg (ref 26.0–34.0)
MCHC: 33.6 g/dL (ref 30.0–36.0)
MCV: 88.4 fL (ref 80.0–100.0)
Monocytes Absolute: 0.4 10*3/uL (ref 0.1–1.0)
Monocytes Relative: 6 %
Neutro Abs: 3.8 10*3/uL (ref 1.7–7.7)
Neutrophils Relative %: 68 %
Platelets: 181 10*3/uL (ref 150–400)
RBC: 4.58 MIL/uL (ref 3.87–5.11)
RDW: 12.6 % (ref 11.5–15.5)
WBC: 5.7 10*3/uL (ref 4.0–10.5)
nRBC: 0 % (ref 0.0–0.2)

## 2019-12-24 LAB — HEPATIC FUNCTION PANEL
ALT: 15 U/L (ref 0–44)
AST: 17 U/L (ref 15–41)
Albumin: 4.6 g/dL (ref 3.5–5.0)
Alkaline Phosphatase: 48 U/L (ref 38–126)
Bilirubin, Direct: 0.2 mg/dL (ref 0.0–0.2)
Indirect Bilirubin: 2.6 mg/dL — ABNORMAL HIGH (ref 0.3–0.9)
Total Bilirubin: 2.8 mg/dL — ABNORMAL HIGH (ref 0.3–1.2)
Total Protein: 7.5 g/dL (ref 6.5–8.1)

## 2019-12-24 LAB — VITAMIN B12: Vitamin B-12: 301 pg/mL (ref 180–914)

## 2019-12-24 LAB — IRON AND TIBC
Iron: 129 ug/dL (ref 28–170)
Saturation Ratios: 43 % — ABNORMAL HIGH (ref 10.4–31.8)
TIBC: 302 ug/dL (ref 250–450)
UIBC: 173 ug/dL

## 2019-12-24 LAB — FOLATE: Folate: 9.8 ng/mL (ref >5.9)

## 2019-12-24 LAB — FERRITIN: Ferritin: 142 ng/mL (ref 11–307)

## 2019-12-28 ENCOUNTER — Encounter (HOSPITAL_COMMUNITY): Payer: Self-pay | Admitting: Hematology

## 2019-12-28 ENCOUNTER — Other Ambulatory Visit: Payer: Self-pay

## 2019-12-28 ENCOUNTER — Inpatient Hospital Stay (HOSPITAL_BASED_OUTPATIENT_CLINIC_OR_DEPARTMENT_OTHER): Payer: Commercial Managed Care - PPO | Admitting: Hematology

## 2019-12-28 DIAGNOSIS — D508 Other iron deficiency anemias: Secondary | ICD-10-CM | POA: Diagnosis not present

## 2019-12-28 DIAGNOSIS — D509 Iron deficiency anemia, unspecified: Secondary | ICD-10-CM | POA: Diagnosis not present

## 2019-12-28 NOTE — Progress Notes (Signed)
[facility] [address] [place], [place] [address]   CLINIC:  Medical Oncology/Hematology  PCP:  Lawerance SabalWorley, Miranda, [place] [address] [place] [address] [phone]   REASON FOR VISIT:  Follow-up for anemia  CURRENT THERAPY: Clinical surveillance    INTERVAL HISTORY:  [name] 24 y.o. female Modena JanskyZentz today for follow-up.  She reports overall doing fair.  Her main concern today is right upper quadrant pain.  She states the pain is intermittent.  It cannot be time to activity or meals.  The pain resolved spontaneous.  She denies any change in diet.  No change in bowel habits.  She states the pain has been ongoing for the past 6 months.  She denies any nausea.  She describes the pain as a dull to intense pain.  She denies any obvious signs of bleeding.  Denies any fevers, chills, night sweats.  No weight loss.   REVIEW OF SYSTEMS:  Review of Systems  Constitutional: Positive for fatigue.  HENT:  Negative.   Eyes: Negative.   Respiratory: Negative.   Cardiovascular: Negative.   Gastrointestinal: Positive for abdominal pain.  Endocrine: Negative.   Genitourinary: Negative.    Musculoskeletal: Negative.   Skin: Negative.   Neurological: Negative.   Hematological: Negative.   Psychiatric/Behavioral: Negative.      PAST MEDICAL/SURGICAL HISTORY:  Past Medical History:  Diagnosis Date  . Allergy   . Anxiety and depression   . Asthma   . Birth defect   . Blood transfusion without reported diagnosis   . Clotting disorder (HCC)   . Complication of anesthesia    'I had stridor after my 1st D&C but with my 2nd one they gave me a breathing treatment before my surgery and I did ok".  . Cyst of left kidney    frequent cysts to kidneys  . Dysrhythmia    h/o SVT  . Family history of adverse reaction to anesthesia   . GERD (gastroesophageal reflux disease)   . Headache   . History of recurrent miscarriages   . Hypoglycemia   . Lupus anticoagulant positive   .  Neuropathic pain   . Palpitations    Past Surgical History:  Procedure Laterality Date  . CESAREAN SECTION    . CHOLECYSTECTOMY N/A 07/23/2018   Procedure: LAPAROSCOPIC CHOLECYSTECTOMY;  Surgeon: Carol RoersBridges, Lindsay C, MD;  Location: [facility];  Service: General;  Laterality: N/A;  . DILATION AND CURETTAGE OF UTERUS N/A 01/29/2017   Procedure: SUCTION DILATATION AND CURETTAGE;  Surgeon: Tilda BurrowJohn Ferguson V, MD;  Location: [facility];  Service: Gynecology;  Laterality: N/A;  . DILATION AND CURETTAGE OF UTERUS N/A 05/01/2017   Procedure: SUCTION DILATATION AND CURETTAGE;  Surgeon: Lazaro ArmsLuther H Eure, MD;  Location: [facility];  Service: Gynecology;  Laterality: N/A;  pt to arrive at 7:15 to have labwork  . FETOSCOPIC LASER PHOTOCOAGULATION  11/14/2017   of 34 placental vessels for TTTS  . WISDOM TOOTH EXTRACTION       SOCIAL HISTORY:  Social History   Socioeconomic History  . Marital status: Single    Spouse name: Not on file  . Number of children: Not on file  . Years of education: Not on file  . Highest education level: Not on file  Occupational History  . Occupation: Loss adjuster, charteredneonatal secretary  . Occupation: Psychologist, sport and exercisenurse tech  Tobacco Use  . Smoking status: Never Smoker  . Smokeless tobacco: Never Used  Substance and Sexual Activity  . Alcohol use: No  Alcohol/week: 0.0 standard drinks  . Drug use: No  . Sexual activity: Not Currently    Birth control/protection: None  Other Topics Concern  . Not on file  Social History Narrative  . Not on file   Social Determinants of Health   Financial Resource Strain:   . Difficulty of Paying Living Expenses: Not on file  Food Insecurity:   . Worried About Charity fundraiser in the Last Year: Not on file  . Ran Out of Food in the Last Year: Not on file  Transportation Needs:   . Lack of Transportation (Medical): Not on file  . Lack of Transportation (Non-Medical): Not on file  Physical Activity:   . Days of Exercise per Week: Not on file  . Minutes of Exercise  per Session: Not on file  Stress:   . Feeling of Stress : Not on file  Social Connections:   . Frequency of Communication with Friends and Family: Not on file  . Frequency of Social Gatherings with Friends and Family: Not on file  . Attends Religious Services: Not on file  . Active Member of Clubs or Organizations: Not on file  . Attends Archivist Meetings: Not on file  . Marital Status: Not on file  Intimate Partner Violence:   . Fear of Current or Ex-Partner: Not on file  . Emotionally Abused: Not on file  . Physically Abused: Not on file  . Sexually Abused: Not on file    FAMILY HISTORY:  Family History  Adopted: Yes  Problem Relation Age of Onset  . Migraines Sister   . Hypertension Maternal Grandmother   . Skin cancer Maternal Grandmother   . Heart disease Maternal Grandfather     CURRENT MEDICATIONS:  Outpatient Encounter Medications as of 12/28/2019  Medication Sig  . aspirin EC 81 MG tablet Take 81 mg by mouth every 4 (four) hours as needed.  . gabapentin (NEURONTIN) 300 MG capsule TAKE 1 CAPSULE BY MOUTH THREE TIMES A DAY (Patient taking differently: Take 300 mg by mouth 2 (two) times daily. )  . medroxyPROGESTERone (DEPO-PROVERA) 150 MG/ML injection Inject 1 mL (150 mg total) into the muscle every 3 (three) months.  Marland Kitchen omeprazole (PRILOSEC) 40 MG capsule Take 40 mg by mouth 2 (two) times daily.  . [DISCONTINUED] citalopram (CELEXA) 20 MG tablet   . [DISCONTINUED] escitalopram (LEXAPRO) 20 MG tablet Take 20 mg by mouth daily.  Marland Kitchen acetaminophen (TYLENOL) 500 MG tablet Take 500 mg by mouth every 6 (six) hours as needed for moderate pain, fever or headache.  . albuterol (VENTOLIN HFA) 108 (90 Base) MCG/ACT inhaler Inhale 2 puffs into the lungs every 6 (six) hours as needed for wheezing or shortness of breath. (Patient not taking: Reported on 12/28/2019)  . clonazePAM (KLONOPIN) 0.5 MG tablet   . lidocaine (LIDODERM) 5 % Place 1 patch onto the skin daily as needed  (back pain). May wear up to 12 hours  . methocarbamol (ROBAXIN) 500 MG tablet methocarbamol 500 mg tablet  TAKE 1 TABLET BY MOUTH TWICE A DAY AS NEEDED  . zolpidem (AMBIEN) 5 MG tablet Take 1 tablet (5 mg total) by mouth at bedtime as needed for sleep. (Patient not taking: Reported on 08/25/2019)  . [DISCONTINUED] colestipol (COLESTID) 1 g tablet   . [DISCONTINUED] guaiFENesin-codeine 100-10 MG/5ML syrup Take 5 mLs by mouth as needed.   . [DISCONTINUED] montelukast (SINGULAIR) 10 MG tablet    No facility-administered encounter medications on file as of 12/28/2019.  ALLERGIES:  Allergies  Allergen Reactions  . Pecan Extract Allergy Skin Test Rash and Swelling  . Pecan Nut (Diagnostic) Hives  . Reglan [Metoclopramide] Hives     PHYSICAL EXAM:  ECOG Performance status: 1  Vitals:   12/28/19 1201  BP: 120/81  Pulse: 84  Resp: 18  Temp: (!) 97.5 F (36.4 C)  SpO2: 99%   Filed Weights   12/28/19 1201  Weight: 143 lb 6.4 oz (65 kg)    Physical Exam Constitutional:      Appearance: Normal appearance.  HENT:     Head: Normocephalic.     Right Ear: External ear normal.     Left Ear: External ear normal.     Nose: Nose normal.     Mouth/Throat:     Pharynx: Oropharynx is clear.  Eyes:     Conjunctiva/sclera: Conjunctivae normal.  Cardiovascular:     Rate and Rhythm: Normal rate and regular rhythm.     Pulses: Normal pulses.  Pulmonary:     Effort: Pulmonary effort is normal.     Breath sounds: Normal breath sounds.  Abdominal:     General: Bowel sounds are normal.  Musculoskeletal:        General: Normal range of motion.     Cervical back: Normal range of motion.  Skin:    General: Skin is warm.  Neurological:     General: No focal deficit present.     Mental Status: She is alert and oriented to person, place, and time.  Psychiatric:        Mood and Affect: Mood normal.        Behavior: Behavior normal.      LABORATORY DATA:  I have reviewed the labs as  listed.  CBC    Component Value Date/Time   WBC 5.7 12/24/2019 0845   RBC 4.58 12/24/2019 0845   HGB 13.6 12/24/2019 0845   HGB 12.1 04/28/2018 1611   HCT 40.5 12/24/2019 0845   HCT 35.5 04/28/2018 1611   PLT 181 12/24/2019 0845   PLT 187 04/28/2018 1611   MCV 88.4 12/24/2019 0845   MCV 83 04/28/2018 1611   MCH 29.7 12/24/2019 0845   MCHC 33.6 12/24/2019 0845   RDW 12.6 12/24/2019 0845   RDW 14.0 04/28/2018 1611   LYMPHSABS 1.4 12/24/2019 0845   LYMPHSABS 1.4 04/28/2018 1611   MONOABS 0.4 12/24/2019 0845   EOSABS 0.0 12/24/2019 0845   EOSABS 0.1 04/28/2018 1611   BASOSABS 0.0 12/24/2019 0845   BASOSABS 0.0 04/28/2018 1611   CMP Latest Ref Rng & Units 12/24/2019 04/22/2019 04/13/2019  Glucose 70 - 99 mg/dL - - 92  BUN 6 - 20 mg/dL - - 13  Creatinine 1.61 - 1.00 mg/dL - - 0.96  Sodium 045 - 145 mmol/L - - 138  Potassium 3.5 - 5.1 mmol/L - - 4.5  Chloride 98 - 111 mmol/L - - 106  CO2 22 - 32 mmol/L - - 26  Calcium 8.9 - 10.3 mg/dL - - 8.9  Total Protein 6.5 - 8.1 g/dL 7.5 7.9 7.4  Total Bilirubin 0.3 - 1.2 mg/dL 2.8(H) 1.8(H) 2.8(H)  Alkaline Phos 38 - 126 U/L 48 44 54  AST 15 - 41 U/L ALT 0 - 44 U/L ASSESSMENT & PLAN:   Iron deficiency anemia 1.  Iron deficiency state: - Ferritin was low on 06/19/1999 2814. -Last menstrual period was 2  to 3 months ago.  She has been on Depo-Provera shots for birth control. - She received 4 infusions of Venofer, last one on 08/05/2019. -Recent labs from December 24, 2019 reveal hemoglobin of 13.6, MCV of 88.4, ferritin 142, iron 129, TIBC 302, saturation 43%.  Labs are stable.  There is no indication for parental iron at this time.  We will continue to monitor.  Return to clinic in 4 months with labs.  2.  Indirect hyperbilirubinemia: - She was found to have elevated total bilirubin on 04/01/2019 at 2.7. - LDH was mildly elevated.  It was predominantly indirect bilirubin.  Direct Coombs test was negative  for IgG and complement. - DAT negative hemolytic anemia evaluation at [facility] lab was also negative.  Last total bilirubin was 1.8 in April. - Differential diagnosis includes Gilbert's syndrome.  -Recent labs from December 24, 2019 reveal a increase in total bilirubin of 2.8, direct bilirubin is elevated at 2.6, indirect bilirubin 0.2.  Patient also reports right upper quadrant pain.  Have recommended patient proceed with abdominal ultrasound to further evaluate underlying etiology.  We will also refer patient to gastroenterology for proper management work-up.  3.  Right chest wall pain: -She reported right parasternal chest pain for the last couple of weeks.  It has no correlation to movements, breathing or time.  No tenderness on exam. - She was reportedly evaluated by cardiology for tachycardia.  She reported that she would call them if it does not improve in 2 weeks.      Orders placed this encounter:  Orders Placed This Encounter  Procedures  . US Abdomen Complete      [facility] [phone]

## 2019-12-28 NOTE — Assessment & Plan Note (Signed)
1.  Iron deficiency state: - Ferritin was low on 06/19/1999 2814. -Last menstrual period was 2 to 3 months ago.  She has been on Depo-Provera shots for birth control. - She received 4 infusions of Venofer, last one on 08/05/2019. -Recent labs from December 24, 2019 reveal hemoglobin of 13.6, MCV of 88.4, ferritin 142, iron 129, TIBC 302, saturation 43%.  Labs are stable.  There is no indication for parental iron at this time.  We will continue to monitor.  Return to clinic in 4 months with labs.  2.  Indirect hyperbilirubinemia: - She was found to have elevated total bilirubin on 04/01/2019 at 2.7. - LDH was mildly elevated.  It was predominantly indirect bilirubin.  Direct Coombs test was negative for IgG and complement. - DAT negative hemolytic anemia evaluation at [facility] lab was also negative.  Last total bilirubin was 1.8 in April. - Differential diagnosis includes Gilbert's syndrome.  -Recent labs from December 24, 2019 reveal a increase in total bilirubin of 2.8, direct bilirubin is elevated at 2.6, indirect bilirubin 0.2.  Patient also reports right upper quadrant pain.  Have recommended patient proceed with abdominal ultrasound to further evaluate underlying etiology.  We will also refer patient to gastroenterology for proper management work-up.  3.  Right chest wall pain: -She reported right parasternal chest pain for the last couple of weeks.  It has no correlation to movements, breathing or time.  No tenderness on exam. - She was reportedly evaluated by cardiology for tachycardia.  She reported that she would call them if it does not improve in 2 weeks.

## 2019-12-29 ENCOUNTER — Ambulatory Visit (HOSPITAL_COMMUNITY): Payer: Commercial Managed Care - PPO | Admitting: Hematology

## 2019-12-29 ENCOUNTER — Ambulatory Visit (HOSPITAL_COMMUNITY)
Admission: RE | Admit: 2019-12-29 | Discharge: 2019-12-29 | Disposition: A | Discharge status: Home / Self Care | Payer: Commercial Managed Care - PPO | Source: Ambulatory Visit | Attending: Hematology | Admitting: Hematology

## 2019-12-30 ENCOUNTER — Ambulatory Visit (HOSPITAL_COMMUNITY): Payer: Commercial Managed Care - PPO | Admitting: Hematology

## 2019-12-30 ENCOUNTER — Ambulatory Visit (HOSPITAL_COMMUNITY): Payer: Commercial Managed Care - PPO

## 2020-01-04 NOTE — Progress Notes (Signed)
TELEHEALTH VISIT  Referring Provider: Roger Shelter, FNP Primary Care Physician:  Denny Levy, [place]  Chief complaint: Hyperbilirubinemia   IMPRESSION:  RUQ pain treated with NSAIDs Indirect Hyperbilirubinemia    -Total bilirubin 2.7 04/01/2019    - repeatedly normal AST, ALT, alk phos and direct bilirubin    - CT abd/pelvis 04/01/19: normal liver and biliary tree    - acute hepatitis panel negative    - urine negative for bilirubin    - Abdominal ultrasound 12/29/19: cholecystectomy, otherwise normal Unexplained iron deficiency requiring IV Venofer 08/05/19 Upper limit of normal spleen size noted on recent CT    - platelets 158, with a range of 1 71-200 over the last 3 years Hypokalemia while in the ED 04/01/2019 Hyperglycemia noted while in the ED 04/01/2019 Multiple systemic complaints Prior cholecystectomy  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  Will defer need for further evaluation to her hematologist, particularly given the concurrent borderline splenomegaly and thrombocytopenia.  Now with right upper quadrant pain.  No alarm features although she does have chronic unexplained iron deficiency.  I do not think the indirect bilirubinemia is related to her new right upper quadrant pain.  She has been treating her pain with NSAIDs which may be exacerbating the problem.  Differential includes NSAID related peptic ulcer disease, reflux esophagitis, nonerosive esophagitis, H. pylori, gastric outlet obstruction, and less likely hepatobiliary sources given her recent CT and ultrasound.   PLAN: Continue omeprazole 40 mg BID Add famotidine 20 mg BID Avoid all NSAIDs EGD with gastric biopsies MRI/MRCP if the EGD is non-diagnostic Consider colonoscopy if her hematologist does not feel her iron deficiency is adequately explained   The nature of the procedure, as well as the risks, benefits, and alternatives were carefully and thoroughly reviewed with the patient.  Ample time for discussion and questions allowed. The patient understood, was satisfied, and agreed to proceed.  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 The interval history is obtained through the patient review of her electronic health record.  She has a history of antiphospholipid syndrome and a positive lupus anticoagulant.  She has had 5 first trimester miscarriages.  Recent notes from hematology and her primary care provider note recent evaluation for diffuse body pains, fatigue, and neuropathy.  The labs obtained in that evaluation are outlined below.    Awoke with scleral icterus. PCP telehealth visit confirmed scleral icterus and found an elevated indirect bilirubin. Referred to  ED 04/01/19.  Hepatitis testing was negative. CT abd/pelvis revealed no cause.  Subsequent testing has included multiple liver panels and a normal abdominal ultrasound.  Abdominal ultrasound 12/29/19: cholecystectomy, otherwise normal  She returns in scheduled follow-up after recently seeing her hematologist.  "Something is going on."  Has been having ongoing weakness, fatigue lightheadness, and a vague, intermittent, dull, numb, intermittent squeezing non-radiating pain in the right side for several months that has been worse over the last few weeks. There is associated nausea and anorexia.  Pain can last for minutes to hours or even days. No identified triggers. No change with eating, defecation, or movement, although applying pressure over the RUQ can create pain - although a different pain.  Feels weak and miserable. Diffuse itching. Feels like her kidney are working too hard due to polyuria. No diarrhea. No blood or mucous in the stool.   Tried ibuprofen and aleve 3-4 times daily.  She has been trying to avoid Tylenol given her hyperbilirubinemia.  Labs 11/06/17: total bilirubin 1.3 Labs 04/28/18: RPR nonreactive, HIV  nonreactive Labs 11/1318: total bilirubin 0.9 Labs 09/26/18: total bilirubin 1.2 Labs 01/28/19: Negative acute hepatitis panel, RCP <0.8, normal IgG, IgM, and IgA, ESR 4, TSH 0.85, ANA negative, RA negative Labs 04/01/19: total bilirubin 2.7, negative urine bilirubin, acute hepatitis panel negative, CK and CKMD normal, platelets 158, normal CMP except K 3.3, glucose 101, Tylenol level undetected Labs 12/24/19: total bilirubin 2.8, indirect bilirubin 2.6, AST 17, ALT 15, alk phos 48 CT abd/pelvis with contrast 04/01/19: normal liver and biliary tree, underdistended colon, "top-normal" spleen size Abdominal ultrasound 12/29/19: cholecystectomy, otherwise normal  One of her coworkers at the hospital who works in both the [place] and the ER suggested that she may need an ERCP.   Past Medical History:  Diagnosis Date  . Allergy   . Anxiety and depression   . Asthma   . Birth defect   . Blood transfusion without reported diagnosis   . Clotting disorder ([place])   . Complication of anesthesia    'I had stridor after my 1st D&C but with my 2nd one they gave me a breathing treatment before my surgery and I did ok".  . Cyst of left kidney    frequent cysts to kidneys  . Dysrhythmia    h/o SVT  . Family history of adverse reaction to anesthesia   . GERD (gastroesophageal reflux disease)   . Headache   . History of recurrent miscarriages   . Hypoglycemia   . Lupus anticoagulant positive   . Neuropathic pain   . Palpitations     Past Surgical History:  Procedure Laterality Date  . CESAREAN SECTION    . CHOLECYSTECTOMY N/A 07/23/2018   Procedure: LAPAROSCOPIC CHOLECYSTECTOMY;  Surgeon: Virl Cagey, MD;  Location: [facility];  Service: General;  Laterality: N/A;  . DILATION AND CURETTAGE OF UTERUS N/A 01/29/2017   Procedure: SUCTION DILATATION AND CURETTAGE;  Surgeon: Jonnie Kind, MD;  Location: [facility];  Service: Gynecology;  Laterality: N/A;  . DILATION AND CURETTAGE OF UTERUS N/A 05/01/2017    Procedure: SUCTION DILATATION AND CURETTAGE;  Surgeon: Florian Buff, MD;  Location: [facility];  Service: Gynecology;  Laterality: N/A;  pt to arrive at 7:15 to have labwork  . FETOSCOPIC LASER PHOTOCOAGULATION  11/14/2017   of 34 placental vessels for TTTS  . WISDOM TOOTH EXTRACTION      Current Outpatient Medications  Medication Sig Dispense Refill  . acetaminophen (TYLENOL) 500 MG tablet Take 500 mg by mouth every 6 (six) hours as needed for moderate pain, fever or headache.    . albuterol (VENTOLIN HFA) 108 (90 Base) MCG/ACT inhaler Inhale 2 puffs into the lungs every 6 (six) hours as needed for wheezing or shortness of breath. (Patient not taking: Reported on 12/28/2019) 1 Inhaler 0  . aspirin EC 81 MG tablet Take 81 mg by mouth every 4 (four) hours as needed.    . clonazePAM (KLONOPIN) 0.5 MG tablet     . gabapentin (NEURONTIN) 300 MG capsule TAKE 1 CAPSULE BY MOUTH THREE TIMES A DAY (Patient taking differently: Take 300 mg by mouth 2 (two) times daily. ) 270 capsule 2  . lidocaine (LIDODERM) 5 % Place 1 patch onto the skin daily as needed (back pain). May wear up to 12 hours    . medroxyPROGESTERone (DEPO-PROVERA) 150 MG/ML injection Inject 1 mL (150 mg total) into the muscle every  3 (three) months. 1 mL 3  . methocarbamol (ROBAXIN) 500 MG tablet methocarbamol 500 mg tablet  TAKE 1 TABLET BY MOUTH TWICE A DAY AS NEEDED    . omeprazole (PRILOSEC) 40 MG capsule Take 40 mg by mouth 2 (two) times daily.    Marland Kitchen zolpidem (AMBIEN) 5 MG tablet Take 1 tablet (5 mg total) by mouth at bedtime as needed for sleep. (Patient not taking: Reported on 08/25/2019) 30 tablet 2   No current facility-administered medications for this visit.    Allergies as of 01/05/2020 - Review Complete 12/28/2019  Allergen Reaction Noted  . Pecan extract allergy skin test Rash and Swelling 01/22/2017  . Pecan nut (diagnostic) Hives 01/22/2017  . Reglan [metoclopramide] Hives 01/22/2017    Family History  Adopted: Yes   Problem Relation Age of Onset  . Migraines Sister   . Hypertension Maternal Grandmother   . Skin cancer Maternal Grandmother   . Heart disease Maternal Grandfather     Social History   Socioeconomic History  . Marital status: Single    Spouse name: Not on file  . Number of children: Not on file  . Years of education: Not on file  . Highest education level: Not on file  Occupational History  . Occupation: Doctor, hospital  . Occupation: Chartered certified accountant  Tobacco Use  . Smoking status: Never Smoker  . Smokeless tobacco: Never Used  Substance and Sexual Activity  . Alcohol use: No    Alcohol/week: 0.0 standard drinks  . Drug use: No  . Sexual activity: Not Currently    Birth control/protection: None  Other Topics Concern  . Not on file  Social History Narrative  . Not on file   Social Determinants of Health   Financial Resource Strain:   . Difficulty of Paying Living Expenses: Not on file  Food Insecurity:   . Worried About Charity fundraiser in the Last Year: Not on file  . Ran Out of Food in the Last Year: Not on file  Transportation Needs:   . Lack of Transportation (Medical): Not on file  . Lack of Transportation (Non-Medical): Not on file  Physical Activity:   . Days of Exercise per Week: Not on file  . Minutes of Exercise per Session: Not on file  Stress:   . Feeling of Stress : Not on file  Social Connections:   . Frequency of Communication with Friends and Family: Not on file  . Frequency of Social Gatherings with Friends and Family: Not on file  . Attends Religious Services: Not on file  . Active Member of Clubs or Organizations: Not on file  . Attends Archivist Meetings: Not on file  . Marital Status: Not on file  Intimate Partner Violence:   . Fear of Current or Ex-Partner: Not on file  . Emotionally Abused: Not on file  . Physically Abused: Not on file  . Sexually Abused: Not on file     Physical Exam: General: in no acute distress.   Appears her stated age.   Neuro: Alert and appropriate Abd: umbilical ring, she localizes the pain in the right mid abdomen, I am unable to reproduce the pain today.  No rebound or guarding.  Normal bowel sounds.  No hepatosplenomegaly. Psych: Normal affect and normal insight   Vonette Grosso L. Tarri Glenn, MD, MPH [facility] 01/04/2020, 7:30 PM

## 2020-01-05 ENCOUNTER — Ambulatory Visit (HOSPITAL_COMMUNITY): Payer: Commercial Managed Care - PPO | Admitting: Hematology

## 2020-01-05 ENCOUNTER — Encounter: Payer: Self-pay | Admitting: Gastroenterology

## 2020-01-05 ENCOUNTER — Ambulatory Visit (INDEPENDENT_AMBULATORY_CARE_PROVIDER_SITE_OTHER): Payer: Commercial Managed Care - PPO | Admitting: Gastroenterology

## 2020-01-05 VITALS — BP 100/62 | HR 88 | Temp 97.3°F | Ht 66.0 in | Wt 148.0 lb

## 2020-01-05 DIAGNOSIS — R1011 Right upper quadrant pain: Secondary | ICD-10-CM

## 2020-01-05 DIAGNOSIS — Z1159 Encounter for screening for other viral diseases: Secondary | ICD-10-CM

## 2020-01-05 MED ORDER — DICYCLOMINE HCL 20 MG PO TABS: 20.0000 mg | ORAL_TABLET | Freq: Four times a day (QID) | ORAL | 3 refills | Status: DC | PRN

## 2020-01-05 MED ORDER — FAMOTIDINE 20 MG PO TABS: 20.0000 mg | ORAL_TABLET | Freq: Two times a day (BID) | ORAL | 3 refills | Status: DC

## 2020-01-05 NOTE — Patient Instructions (Addendum)
I have recommended an upper endoscopy for further evaluation of your abdominal pain. If the upper endoscopy doesn't give [place] any answers, will plan an MRI with MRCP.   Please continue to take your omeprazole 40 mg twice daily. Let's try to add famotidine 20 mg twice daily to see if that provides any additional relief.  Avoid all NSAIDs.   I am also going to send you with a prescription for dicyclomine 20 mg to use up to four times daily as needed for the abdominal pain.    Your elevated indirect bilirubin is likely due to Gilbert's Syndrome.  For more information on Gilbert's syndrome I recommend the following website: [email or web address]-[email or web address]

## 2020-01-13 ENCOUNTER — Ambulatory Visit (INDEPENDENT_AMBULATORY_CARE_PROVIDER_SITE_OTHER): Payer: Commercial Managed Care - PPO

## 2020-01-13 ENCOUNTER — Other Ambulatory Visit: Payer: Self-pay | Admitting: Gastroenterology

## 2020-01-13 DIAGNOSIS — Z1159 Encounter for screening for other viral diseases: Secondary | ICD-10-CM

## 2020-01-14 LAB — SARS CORONAVIRUS 2 (TAT 6-24 HRS): SARS Coronavirus 2: NEGATIVE

## 2020-01-15 ENCOUNTER — Other Ambulatory Visit: Payer: Self-pay

## 2020-01-15 ENCOUNTER — Emergency Department (HOSPITAL_COMMUNITY)
Admission: EM | Admit: 2020-01-15 | Discharge: 2020-01-15 | Disposition: A | Discharge status: Home / Self Care | Payer: Commercial Managed Care - PPO | Attending: Emergency Medicine | Admitting: Emergency Medicine

## 2020-01-15 ENCOUNTER — Encounter: Payer: Self-pay | Admitting: Gastroenterology

## 2020-01-15 ENCOUNTER — Ambulatory Visit (AMBULATORY_SURGERY_CENTER): Payer: Commercial Managed Care - PPO | Admitting: Gastroenterology

## 2020-01-15 ENCOUNTER — Emergency Department (HOSPITAL_COMMUNITY): Payer: Commercial Managed Care - PPO

## 2020-01-15 DIAGNOSIS — R1011 Right upper quadrant pain: Secondary | ICD-10-CM | POA: Diagnosis not present

## 2020-01-15 DIAGNOSIS — Z79899 Other long term (current) drug therapy: Secondary | ICD-10-CM | POA: Insufficient documentation

## 2020-01-15 DIAGNOSIS — K228 Other specified diseases of esophagus: Secondary | ICD-10-CM | POA: Diagnosis not present

## 2020-01-15 DIAGNOSIS — R569 Unspecified convulsions: Secondary | ICD-10-CM | POA: Diagnosis present

## 2020-01-15 DIAGNOSIS — J45909 Unspecified asthma, uncomplicated: Secondary | ICD-10-CM | POA: Diagnosis not present

## 2020-01-15 DIAGNOSIS — K2970 Gastritis, unspecified, without bleeding: Secondary | ICD-10-CM

## 2020-01-15 DIAGNOSIS — K2950 Unspecified chronic gastritis without bleeding: Secondary | ICD-10-CM

## 2020-01-15 DIAGNOSIS — F419 Anxiety disorder, unspecified: Secondary | ICD-10-CM | POA: Insufficient documentation

## 2020-01-15 LAB — I-STAT CHEM 8, ED
BUN: 12 mg/dL (ref 6–20)
Calcium, Ion: 1.19 mmol/L (ref 1.15–1.40)
Chloride: 108 mmol/L (ref 98–111)
Creatinine, Ser: 0.7 mg/dL (ref 0.44–1.00)
Glucose, Bld: 86 mg/dL (ref 70–99)
HCT: 34 % — ABNORMAL LOW (ref 36.0–46.0)
Hemoglobin: 11.6 g/dL — ABNORMAL LOW (ref 12.0–15.0)
Potassium: 3.9 mmol/L (ref 3.5–5.1)
Sodium: 142 mmol/L (ref 135–145)
TCO2: 22 mmol/L (ref 22–32)

## 2020-01-15 MED ORDER — SODIUM CHLORIDE 0.9 % IV SOLN: 500.0000 mL | Freq: Once | INTRAVENOUS | Status: AC

## 2020-01-15 NOTE — Progress Notes (Signed)
Called to room to assist during endoscopic procedure.  Patient ID and intended procedure confirmed with present staff. Received instructions for my participation in the procedure from the performing physician.  

## 2020-01-15 NOTE — ED Provider Notes (Signed)
[facility] DEPT Provider Note   CSN: [id number] Arrival date & time: 01/15/20  1125     History Chief Complaint  Patient presents with  . Psychiatric Evaluation    Carol Bernard is a 25 y.o. female.  25 year old female who presents from endoscopy after having possible seizure activity.  Patient does have a history of anxiety.  Had recovered from her medication and and had whole body shaking.  No postictal period.  No tongue or loss of bowel or bladder function.  Patient states that she had a history of seizure in the past x1 related to alcohol but denies any use of alcohol at this time.  Currently feels back to her baseline.  Denies any recent medication changes or illicit drug use.        Past Medical History:  Diagnosis Date  . Allergy   . Anxiety and depression   . Asthma   . Birth defect   . Blood transfusion without reported diagnosis   . Clotting disorder ([place])   . Complication of anesthesia    'I had stridor after my 1st D&C but with my 2nd one they gave me a breathing treatment before my surgery and I did ok".  . Cyst of left kidney    frequent cysts to kidneys  . Dysrhythmia    h/o SVT  . Family history of adverse reaction to anesthesia   . GERD (gastroesophageal reflux disease)   . Headache   . History of recurrent miscarriages   . Hypoglycemia   . Lupus anticoagulant positive   . Neuropathic pain   . Palpitations     Patient Active Problem List   Diagnosis Date Noted  . Iron deficiency anemia 06/25/2019  . Short interval between pregnancies affecting pregnancy, antepartum 04/28/2018  . Lupus anticoagulant affecting pregnancy, antepartum ([place]) 04/28/2018  . Previous cesarean section 04/28/2018  . Gestational thrombocytopenia ([place]) 08/17/2017  . 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 CESAREAN SECTION    . CHOLECYSTECTOMY N/A 07/23/2018   Procedure:  LAPAROSCOPIC CHOLECYSTECTOMY;  Surgeon: Virl Cagey, MD;  Location: [facility];  Service: General;  Laterality: N/A;  . DILATION AND CURETTAGE OF UTERUS N/A 01/29/2017   Procedure: SUCTION DILATATION AND CURETTAGE;  Surgeon: Jonnie Kind, MD;  Location: [facility];  Service: Gynecology;  Laterality: N/A;  . DILATION AND CURETTAGE OF UTERUS N/A 05/01/2017   Procedure: SUCTION DILATATION AND CURETTAGE;  Surgeon: Florian Buff, MD;  Location: [facility];  Service: Gynecology;  Laterality: N/A;  pt to arrive at 7:15 to have labwork  . FETOSCOPIC LASER PHOTOCOAGULATION  11/14/2017   of 34 placental vessels for TTTS  . WISDOM TOOTH EXTRACTION       OB History    Gravida  7   Para  1   Term      Preterm  1   AB  5   Living  2     SAB  4   TAB      Ectopic      Multiple  1   Live Births  2           Family History  Adopted: Yes  Problem Relation Age of Onset  . Migraines Sister   . Hypertension Maternal Grandmother   . Skin cancer Maternal Grandmother   . Heart disease Maternal Grandfather     Social History   Tobacco Use  . Smoking  status: Never Smoker  . Smokeless tobacco: Never Used  Substance Use Topics  . Alcohol use: No    Alcohol/week: 0.0 standard drinks  . Drug use: No    Home Medications Prior to Admission medications   Medication Sig Start Date End Date Taking? Authorizing Provider  acetaminophen (TYLENOL) 500 MG tablet Take 500 mg by mouth every 6 (six) hours as needed for moderate pain, fever or headache.    [provider]  albuterol (VENTOLIN HFA) 108 (90 Base) MCG/ACT inhaler Inhale 2 puffs into the lungs every 6 (six) hours as needed for wheezing or shortness of breath. 07/23/18   Lucretia Roers, MD  aspirin EC 81 MG tablet Take 81 mg by mouth every 4 (four) hours as needed.    [provider]  clonazePAM Scarlette Calico) 0.5 MG tablet  05/27/19   [provider]  dicyclomine (BENTYL) 20 MG tablet Take 1 tablet (20 mg total)  by mouth 4 (four) times daily as needed for spasms. 01/05/20   Tressia Danas, MD  famotidine (PEPCID) 20 MG tablet Take 1 tablet (20 mg total) by mouth 2 (two) times daily. 01/05/20   Tressia Danas, MD  gabapentin (NEURONTIN) 300 MG capsule TAKE 1 CAPSULE BY MOUTH THREE TIMES A DAY Patient taking differently: Take 300 mg by mouth 2 (two) times daily.  03/15/19   Lazaro Arms, MD  lidocaine (LIDODERM) 5 % Place 1 patch onto the skin daily as needed (back pain). May wear up to 12 hours    [provider]  medroxyPROGESTERone (DEPO-PROVERA) 150 MG/ML injection Inject 1 mL (150 mg total) into the muscle every 3 (three) months. 09/11/19   Lazaro Arms, MD  methocarbamol (ROBAXIN) 500 MG tablet methocarbamol 500 mg tablet  TAKE 1 TABLET BY MOUTH TWICE A DAY AS NEEDED    [provider]  omeprazole (PRILOSEC) 40 MG capsule Take 40 mg by mouth 2 (two) times daily. 11/30/19   [provider]  zolpidem (AMBIEN) 5 MG tablet Take 1 tablet (5 mg total) by mouth at bedtime as needed for sleep. Patient not taking: Reported on 01/15/2020 01/16/19 01/04/21  Lazaro Arms, MD    Allergies    Pecan extract allergy skin test, Pecan nut (diagnostic), and Reglan [metoclopramide]  Review of Systems   Review of Systems  All other systems reviewed and are negative.   Physical Exam Updated Vital Signs BP 127/69 (BP Location: Left Arm)   Pulse 91   Temp 98.1 F (36.7 C) (Oral)   Resp (!) 36 Comment: hyperventilating  SpO2 100%   Physical Exam Vitals and nursing note reviewed.  Constitutional:      General: She is not in acute distress.    Appearance: Normal appearance. She is well-developed. She is not toxic-appearing.  HENT:     Head: Normocephalic and atraumatic.  Eyes:     General: Lids are normal.     Conjunctiva/sclera: Conjunctivae normal.     Pupils: Pupils are equal, round, and reactive to light.  Neck:     Thyroid: No thyroid mass.     Trachea: No tracheal  deviation.  Cardiovascular:     Rate and Rhythm: Normal rate and regular rhythm.     Heart sounds: Normal heart sounds. No murmur. No gallop.   Pulmonary:     Effort: Pulmonary effort is normal. No respiratory distress.     Breath sounds: Normal breath sounds. No stridor. No decreased breath sounds, wheezing, rhonchi or rales.  Abdominal:  General: Bowel sounds are normal. There is no distension.     Palpations: Abdomen is soft.     Tenderness: There is no abdominal tenderness. There is no rebound.  Musculoskeletal:        General: No tenderness. Normal range of motion.     Cervical back: Normal range of motion and neck supple.  Skin:    General: Skin is warm and dry.     Findings: No abrasion or rash.  Neurological:     Mental Status: She is alert and oriented to person, place, and time.     GCS: GCS eye subscore is 4. GCS verbal subscore is 5. GCS motor subscore is 6.     Cranial Nerves: No cranial nerve deficit.     Sensory: No sensory deficit.  Psychiatric:        Attention and Perception: Attention normal.        Speech: Speech normal.        Behavior: Behavior normal.        Thought Content: Thought content does not include suicidal ideation. Thought content does not include suicidal plan.     ED Results / Procedures / Treatments   Labs (all labs ordered are listed, but only abnormal results are displayed) Labs Reviewed  I-STAT CHEM 8, ED    EKG None  Radiology No results found.  Procedures Procedures (including critical care time)  Medications Ordered in ED Medications - No data to display  ED Course  I have reviewed the triage vital signs and the nursing notes.  Pertinent labs & imaging results that were available during my care of the patient were reviewed by me and considered in my medical decision making (see chart for details).    MDM Rules/Calculators/A&P                      Patient's electrolyte head CT within normal limits.  Neurological  exam is stable.  Will discharge home Final Clinical Impression(s) / ED Diagnoses Final diagnoses:  None    Rx / DC Orders ED Discharge Orders    None       Lorre Nick, MD 01/15/20 1308

## 2020-01-15 NOTE — Op Note (Signed)
[facility] Patient Name: Carol Bernard Procedure Date: 01/15/2020 10:12 AM MRN: [id number] Endoscopist: Tressia Danas MD, MD Age: 25 Referring MD:  Date of Birth: 1995-08-18 Gender: Female Account #: [id number] Procedure:                Upper GI endoscopy Indications:              Abdominal pain in the right upper quadrant                           Unexplained iron deficiency requiring IV Venofer                            08/05/19 Medicines:                Monitored Anesthesia Care Procedure:                Pre-Anesthesia Assessment:                           - Prior to the procedure, a History and Physical                            was performed, and patient medications and                            allergies were reviewed. The patient's tolerance of                            previous anesthesia was also reviewed. The risks                            and benefits of the procedure and the sedation                            options and risks were discussed with the patient.                            All questions were answered, and informed consent                            was obtained. Prior Anticoagulants: The patient has                            taken no previous anticoagulant or antiplatelet                            agents. ASA Grade Assessment: II - A patient with                            mild systemic disease. After reviewing the risks                            and benefits, the patient was deemed in  satisfactory condition to undergo the procedure.                           After obtaining informed consent, the endoscope was                            passed under direct vision. Throughout the                            procedure, the patient's blood pressure, pulse, and                            oxygen saturations were monitored continuously. The                            Endoscope was introduced through the mouth, and                       advanced to the third part of duodenum. The upper                            GI endoscopy was accomplished without difficulty.                            The patient tolerated the procedure well. Scope In: Scope Out: Findings:                 The examined esophagus was normal. Biopsies were                            taken from the mid/proximal and distal esophagus                            with a cold forceps for histology. Estimated blood                            loss was minimal.                           The entire examined stomach was normal. Biopsies                            were taken from the antrum, body, and fundus with a                            cold forceps for histology. Estimated blood loss                            was minimal.                           The examined duodenum was normal. Biopsies were                            taken with a cold forceps for histology. Estimated  blood loss was minimal.                           The cardia and gastric fundus were normal on                            retroflexion.                           The exam was otherwise without abnormality. Complications:            No immediate complications. Estimated blood loss:                            Minimal. Estimated Blood Loss:     Estimated blood loss was minimal. Impression:               - Normal esophagus. Biopsied.                           - Normal stomach. Biopsied.                           - Normal examined duodenum. Biopsied.                           - The examination was otherwise normal.                           - Source of pain not identified on this study. Will                            await biopsy results. Recommendation:           - Patient has a contact number available for                            emergencies. The signs and symptoms of potential                            delayed complications were discussed  with the                            patient. Return to normal activities tomorrow.                            Written discharge instructions were provided to the                            patient.                           - Resume previous diet.                           - Continue present medications.                           -  Await pathology results.                           - Consider additional imaging with an MRI/MRCP if                            symptoms persist and biopsies from the procedure                            today are normal Tressia Danas MD, MD 01/15/2020 10:27:23 AM This report has been signed electronically.

## 2020-01-15 NOTE — ED Triage Notes (Signed)
Pt is post endoscopy per abd pain. Per CRNA and EMS pt exhibited signs of psuedoseizure activity; no post-ictal state; pt was able to track movement appropriatly as well as maintain A&O x4 and follow commands. Per EMS; CRNA reports Robinul, Propofol, Lidocaine during sedation. Pt was given Albuterol and Versed upon waking up with the presentation described above.

## 2020-01-15 NOTE — Progress Notes (Signed)
Pt transferred to recovery at 1024. Pt sleeping, calm, VSS. Pt continued to sleep and not arouse d/t sedating medication used during EGD until about 1035. Pt began to arouse and RN noted pt appeared to be potentially heaving/nauseated after the procedure. RN at bedside to assess pt and pt still drowsy and unable to verbalize her needs. She physically motioned to RN that she was having difficulty breathing and mimicked needing an inhaler. She then was able to verbalize to RN that she did not take her treatment this morning and that the CRNA had told her that staff could provide her with a breathing treatment if needed. RN notified CRNA and MD of pt's complaint of difficulty breathing and her request for a nebulizer treatment. RN obtained treatment but at that time the pt appeared to worsen with increased respiratory distress with gasping and rapid respiratory rate with shallow breathing, and she began shaking. Shaking appeared to be localized to pt's upper body only including arms and shoulders, her legs/lower body did not shake uncontrollably or move at all during this event. CRNA called to bedside for respiratory support and instructed staff to call 911 for EMS support and need for transfer to higher level of care d/t respiratory distress and possible seizure activity. MD at bedside to assess pt as well. See recovery vital signs documented in flowsheets. Pt's care partner was her sister. Sister was called to the recovery area to update on patient's condition and plan for transfer. Patient given 2mg  of Versed per CRNA to attempt to calm and relax the patient's breathing. Treatment unsuccessful. About 2 minutes after Versed given, EMS arrived to the recovery area to assess pt. EMS discussed with pt's sister that the pt would be taken to the EMS truck to be further assessed and to attempt to calm her breathing and possibly be transferred to the hospital. EMS assumed care of patient.

## 2020-01-15 NOTE — Progress Notes (Signed)
To PACU, VSS. Report to Rn.tb 

## 2020-01-19 ENCOUNTER — Telehealth: Payer: Self-pay

## 2020-01-19 ENCOUNTER — Other Ambulatory Visit: Payer: Self-pay | Admitting: *Deleted

## 2020-01-19 MED ORDER — SUCRALFATE 1 GM/10ML PO SUSP: 1.0000 g | Freq: Two times a day (BID) | ORAL | 0 refills | Status: DC

## 2020-01-19 NOTE — Telephone Encounter (Signed)
  Follow up Call-  Call back number 01/15/2020  Post procedure Call Back phone  # [phone]  Permission to leave phone message Yes  Some recent data might be hidden     Patient questions:  Do you have a fever, pain , or abdominal swelling? No. Pain Score  0 *  Have you tolerated food without any problems? Yes.    Have you been able to return to your normal activities? Yes.    Do you have any questions about your discharge instructions: Diet   No. Medications  No. Follow up visit  No.  Do you have questions or concerns about your Care? No.  Actions: * If pain score is 4 or above: No action needed, pain <4.  1. Have you developed a fever since your procedure? no  2.   Have you had an respiratory symptoms (SOB or cough) since your procedure? no  3.   Have you tested positive for COVID 19 since your procedure no  4.   Have you had any family members/close contacts diagnosed with the COVID 19 since your procedure?  no   If yes to any of these questions please route to Laverna Peace, RN and Jennye Boroughs, Charity fundraiser.    Pt was transferred to [facility] following procedure for suspected seizure activity.  Pt. States that she has no recollection of how she ended up at [facility] until her sister, who accompanied her, explained the situation.  [facility] ER did state that the pt had a seizure and advised her to f/u with neurology and primary care for evaluation.  She states that she will do that.  Otherwise states that she has a slight sore throat, but otherwise doing well.

## 2020-01-19 NOTE — Telephone Encounter (Signed)
1st follow up call made.  NAULM 

## 2020-01-21 NOTE — Progress Notes (Signed)
Cardiology Office Note    Date:  01/22/2020   ID:  Carol, Bernard 1995-08-28, MRN [id number]  PCP:  Lawerance Sabal, PA  Cardiologist: Dina Rich, MD    Chief Complaint  Patient presents with  . Follow-up    History of Present Illness:    Carol Bernard is a 25 y.o. female with past medical history of palpitations, asthma, and positive lupus anticoagulant who presents to the office today for overdue follow-up.  She was last examined by Ronie Spies, PA-C in 08/2018 and reported intermittent episodes of chest discomfort for the past few years which could occur sporadically and was not associated with exertion. She denied any recurrent palpitations or presyncope. A follow-up echocardiogram was obtained which showed a preserved EF of 60 to 65% with no regional wall motion abnormalities.  Most recently, she underwent an endoscopy on 01/15/2020 and was noted to have possible seizure-like activity with entire body shaking. CT Head showed no acute findings and she was overall stable while in the ED. Therefore, she was discharged home. Endoscopy did show gastritis and she has been placed on Pepcid, Omeprazole and Carafate.   In talking with the patient today, she reports having intermittent episodes of shooting chest pain which can last for a few minutes at a time and spontaneously resolves.They typically occur at rest and have overall been stable for the past few years. She denies any exertional chest pain. She reports her palpitations have overall improved and she has not utilized Lopressor in several years. She has decreased her caffeine intake and does not consume alcohol.  She stays active at baseline as she is a mother to 72-year-old twins and also works at the [employer] [facility] [employer]. 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 She does report intermittent dyspnea on exertion with different activities but says this typically improves with the use of her Albuterol inhaler. She has  never been placed on a maintenance inhaler.   Past Medical History:  Diagnosis Date  . Allergy   . Anxiety and depression   . Asthma   . Birth defect   . Blood transfusion without reported diagnosis   . Clotting disorder (HCC)   . Complication of anesthesia    'I had stridor after my 1st D&C but with my 2nd one they gave me a breathing treatment before my surgery and I did ok".  . Cyst of left kidney    frequent cysts to kidneys  . Dysrhythmia    h/o SVT  . Family history of adverse reaction to anesthesia   . GERD (gastroesophageal reflux disease)   . Headache   . History of recurrent miscarriages   . Hypoglycemia   . Lupus anticoagulant positive   . Neuropathic pain   . Palpitations     Past Surgical History:  Procedure Laterality Date  . CESAREAN SECTION    . CHOLECYSTECTOMY N/A 07/23/2018   Procedure: LAPAROSCOPIC CHOLECYSTECTOMY;  Surgeon: Lucretia Roers, MD;  Location: [facility];  Service: General;  Laterality: N/A;  . DILATION AND CURETTAGE OF UTERUS N/A 01/29/2017   Procedure: SUCTION DILATATION AND CURETTAGE;  Surgeon: Tilda Burrow, MD;  Location: [facility];  Service: Gynecology;  Laterality: N/A;  . DILATION AND CURETTAGE OF UTERUS N/A 05/01/2017   Procedure: SUCTION DILATATION AND CURETTAGE;  Surgeon: Lazaro Arms, MD;  Location: [facility];  Service: Gynecology;  Laterality: N/A;  pt to arrive at 7:15 to have labwork  . FETOSCOPIC  LASER PHOTOCOAGULATION  11/14/2017   of 34 placental vessels for TTTS  . WISDOM TOOTH EXTRACTION      Current Medications: Outpatient Medications Prior to Visit  Medication Sig Dispense Refill  . acetaminophen (TYLENOL) 500 MG tablet Take 500 mg by mouth every 6 (six) hours as needed for moderate pain, fever or headache.    . albuterol (VENTOLIN HFA) 108 (90 Base) MCG/ACT inhaler Inhale 2 puffs into the lungs every 6 (six) hours as needed for wheezing or shortness of breath. 1 Inhaler 0  . aspirin EC 81 MG tablet Take 81 mg by mouth every  4 (four) hours as needed.    . clonazePAM (KLONOPIN) 0.5 MG tablet     . dicyclomine (BENTYL) 20 MG tablet Take 1 tablet (20 mg total) by mouth 4 (four) times daily as needed for spasms. 120 tablet 3  . famotidine (PEPCID) 20 MG tablet Take 1 tablet (20 mg total) by mouth 2 (two) times daily. 60 tablet 3  . gabapentin (NEURONTIN) 300 MG capsule Take 300 mg by mouth 2 (two) times daily.    Marland Kitchen lidocaine (LIDODERM) 5 % Place 1 patch onto the skin daily as needed (back pain). May wear up to 12 hours    . medroxyPROGESTERone (DEPO-PROVERA) 150 MG/ML injection Inject 1 mL (150 mg total) into the muscle every 3 (three) months. 1 mL 3  . methocarbamol (ROBAXIN) 500 MG tablet methocarbamol 500 mg tablet  TAKE 1 TABLET BY MOUTH TWICE A DAY AS NEEDED    . omeprazole (PRILOSEC) 40 MG capsule Take 40 mg by mouth 2 (two) times daily.    . sucralfate (CARAFATE) 1 GM/10ML suspension Take 10 mLs (1 g total) by mouth 2 (two) times daily for 14 days. 280 mL 0  . zolpidem (AMBIEN) 5 MG tablet Take 1 tablet (5 mg total) by mouth at bedtime as needed for sleep. 30 tablet 2  . gabapentin (NEURONTIN) 300 MG capsule TAKE 1 CAPSULE BY MOUTH THREE TIMES A DAY (Patient taking differently: Take 300 mg by mouth 2 (two) times daily. ) 270 capsule 2   Facility-Administered Medications Prior to Visit  Medication Dose Route Frequency Provider Last Rate Last Admin  . 0.9 %  sodium chloride infusion  500 mL Intravenous Once Tressia Danas, MD         Allergies:   Pecan extract allergy skin test, Pecan nut (diagnostic), and Reglan [metoclopramide]   Social History   Socioeconomic History  . Marital status: Single    Spouse name: Not on file  . Number of children: Not on file  . Years of education: Not on file  . Highest education level: Not on file  Occupational History  . Occupation: Loss adjuster, chartered  . Occupation: Psychologist, sport and exercise  Tobacco Use  . Smoking status: Never Smoker  . Smokeless tobacco: Never Used  Substance  and Sexual Activity  . Alcohol use: No    Alcohol/week: 0.0 standard drinks  . Drug use: No  . Sexual activity: Not Currently    Birth control/protection: None  Other Topics Concern  . Not on file  Social History Narrative  . Not on file   Social Determinants of Health   Financial Resource Strain:   . Difficulty of Paying Living Expenses: Not on file  Food Insecurity:   . Worried About Programme researcher, broadcasting/film/video in the Last Year: Not on file  . Ran Out of Food in the Last Year: Not on file  Transportation Needs:   .  Lack of Transportation (Medical): Not on file  . Lack of Transportation (Non-Medical): Not on file  Physical Activity:   . Days of Exercise per Week: Not on file  . Minutes of Exercise per Session: Not on file  Stress:   . Feeling of Stress : Not on file  Social Connections:   . Frequency of Communication with Friends and Family: Not on file  . Frequency of Social Gatherings with Friends and Family: Not on file  . Attends Religious Services: Not on file  . Active Member of Clubs or Organizations: Not on file  . Attends Archivist Meetings: Not on file  . Marital Status: Not on file     Family History:  The patient's family history includes Heart disease in her maternal grandfather; Hypertension in her maternal grandmother; Migraines in her sister; Skin cancer in her maternal grandmother. She was adopted.   Review of Systems:   Please see the history of present illness.     General:  No chills, fever, night sweats or weight changes.  Cardiovascular:  No dyspnea on exertion, edema, orthopnea,  paroxysmal nocturnal dyspnea. Positive for chest pain and palpitations.  Dermatological: No rash, lesions/masses Respiratory: No cough, dyspnea Urologic: No hematuria, dysuria Abdominal:   No nausea, vomiting, diarrhea, bright red blood per rectum, melena, or hematemesis Neurologic:  No visual changes, wkns, changes in mental status. All other systems reviewed and are  otherwise negative except as noted above.   Physical Exam:    VS:  BP 118/81   Pulse 99   Temp 98.9 F (37.2 C) (Temporal)   Ht 5\' 6"  (1.676 m)   Wt 149 lb (67.6 kg)   LMP  (LMP Unknown)   SpO2 98%   BMI 24.05 kg/m    General: Well developed, well nourished,female appearing in no acute distress. Head: Normocephalic, atraumatic, sclera non-icteric, no xanthomas, nares are without discharge.  Neck: No carotid bruits. JVD not elevated.  Lungs: Respirations regular and unlabored, without wheezes or rales.  Heart: Regular rate and rhythm. No S3 or S4.  No murmur, no rubs, or gallops appreciated. Abdomen: Soft, non-tender, non-distended with normoactive bowel sounds. No hepatomegaly. No rebound/guarding. No obvious abdominal masses. Msk:  Strength and tone appear normal for age. No joint deformities or effusions. Extremities: No clubbing or cyanosis. No edema.  Distal pedal pulses are 2+ bilaterally. Neuro: Alert and oriented X 3. Moves all extremities spontaneously. No focal deficits noted. Psych:  Responds to questions appropriately with a normal affect. Skin: No rashes or lesions noted  Wt Readings from Last 3 Encounters:  01/22/20 149 lb (67.6 kg)  01/15/20 148 lb (67.1 kg)  01/05/20 148 lb (67.1 kg)     Studies/Labs Reviewed:   EKG:  EKG is ordered today. The ekg ordered today demonstrates NSR, HR 80 with TWI along inferior and precordial leads actually improved when compared to prior tracings.   Recent Labs: 01/28/2019: TSH 0.850 12/24/2019: ALT 15; Platelets 181 01/15/2020: BUN 12; Creatinine, Ser 0.70; Hemoglobin 11.6; Potassium 3.9; Sodium 142   Lipid Panel    Component Value Date/Time   CHOL 129 09/26/2018 1601   TRIG 122 09/26/2018 1601   HDL 48 09/26/2018 1601   CHOLHDL 2.7 09/26/2018 1601   VLDL 24 09/26/2018 1601   LDLCALC 57 09/26/2018 1601    Additional studies/ records that were reviewed today include:   Event Monitor: 06/2016  Telemetry tracings show  sinus rhythm and sinus tachycardia  No symptoms reported  No  significant arrhythmias  Echocardiogram: 09/2018 Study Conclusions  - Left ventricle: The cavity size was normal. Wall thickness was   normal. Systolic function was normal. The estimated ejection   fraction was in the range of 60% to 65%. Wall motion was normal;   there were no regional wall motion abnormalities. Left   ventricular diastolic function parameters were normal. - Aortic valve: Valve area (VTI): 2.13 cm^2. Valve area (Vmax):   2.47 cm^2. Valve area (Vmean): 2.42 cm^2. - Technically adequate study.  Assessment:    1. Heart palpitations   2. Chest pain, unspecified type   3. H/O lupus anticoagulant disorder   4. Intermittent asthma without complication, unspecified asthma severity      Plan:   In order of problems listed above:  1. Palpitations - Prior monitor showed sinus rhythm and sinus tachycardia with no significant arrhythmias.  She reports symptoms have overall been stable. She has reduced her caffeine intake. She does not wish to have an Rx for Lopressor at this time. We reviewed if symptoms worsen, she could utilize as needed Lopressor 12.5 mg. She will let us know if this is needed.  2. Chest Pain with Atypical Features - She describes intermittent episodes of shooting chest discomfort which typically occur at rest and spontaneously resolved within minutes. Symptoms have been occurring for several years have not acutely worsened. No family history of premature CAD. Given prior reassuring work-up, will continue with observation at this time. EKG today actually shows TWI has improved when compared to prior tracings.   3. Positive Lupus Anticoagulant - followed by Hematology. She has not required iron transfusions recently.   4. Asthma - She reports using her Albuterol inhaler several times per week. I recommended that she review this with her PCP as her symptoms seem more persistent and she may  benefit from a maintenance inhaler.   Medication Adjustments/Labs and Tests Ordered: Current medicines are reviewed at length with the patient today.  Concerns regarding medicines are outlined above.  Medication changes, Labs and Tests ordered today are listed in the Patient Instructions below. Patient Instructions  Medication Instructions:  Your physician recommends that you continue on your current medications as directed. Please refer to the Current Medication list given to you today.  *If you need a refill on your cardiac medications before your next appointment, please call your pharmacy*  Lab Work: NONE   If you have labs (blood work) drawn today and your tests are completely normal, you will receive your results only by: Marland Kitchen MyChart Message (if you have MyChart) OR . A paper copy in the mail If you have any lab test that is abnormal or we need to change your treatment, we will call you to review the results.  Testing/Procedures: NONE   Follow-Up: At [facility], you and your health needs are our priority.  As part of our continuing mission to provide you with exceptional heart care, we have created designated Provider Care Teams.  These Care Teams include your primary Cardiologist (physician) and Advanced Practice Providers (APPs -  Physician Assistants and Nurse Practitioners) who all work together to provide you with the care you need, when you need it.  Your next appointment:    As Needed   The format for your next appointment:   Either In Person or Virtual  Provider:   Dina Rich, MD  Other Instructions Thank you for choosing [facility]!    Signed, Ellsworth Lennox, PA-C  01/22/2020 5:35 PM  [facility] [address] [place], [place] [address] Phone: [phone] Fax: [phone]

## 2020-01-22 ENCOUNTER — Encounter: Payer: Self-pay | Admitting: Student

## 2020-01-22 ENCOUNTER — Ambulatory Visit (INDEPENDENT_AMBULATORY_CARE_PROVIDER_SITE_OTHER): Payer: Commercial Managed Care - PPO | Admitting: Student

## 2020-01-22 VITALS — BP 118/81 | HR 99 | Temp 98.9°F | Ht 66.0 in | Wt 149.0 lb

## 2020-01-22 DIAGNOSIS — R079 Chest pain, unspecified: Secondary | ICD-10-CM

## 2020-01-22 DIAGNOSIS — Z862 Personal history of diseases of the blood and blood-forming organs and certain disorders involving the immune mechanism: Secondary | ICD-10-CM

## 2020-01-22 DIAGNOSIS — J4520 Mild intermittent asthma, uncomplicated: Secondary | ICD-10-CM | POA: Diagnosis not present

## 2020-01-22 DIAGNOSIS — R002 Palpitations: Secondary | ICD-10-CM | POA: Diagnosis not present

## 2020-01-22 NOTE — Patient Instructions (Signed)
Medication Instructions:  Your physician recommends that you continue on your current medications as directed. Please refer to the Current Medication list given to you today.  *If you need a refill on your cardiac medications before your next appointment, please call your pharmacy*  Lab Work: NONE   If you have labs (blood work) drawn today and your tests are completely normal, you will receive your results only by: Marland Kitchen MyChart Message (if you have MyChart) OR . A paper copy in the mail If you have any lab test that is abnormal or we need to change your treatment, we will call you to review the results.  Testing/Procedures: NONE   Follow-Up: At [facility], you and your health needs are our priority.  As part of our continuing mission to provide you with exceptional heart care, we have created designated Provider Care Teams.  These Care Teams include your primary Cardiologist (physician) and Advanced Practice Providers (APPs -  Physician Assistants and Nurse Practitioners) who all work together to provide you with the care you need, when you need it.  Your next appointment:    As Needed   The format for your next appointment:   Either In Person or Virtual  Provider:   Dina Rich, MD  Other Instructions Thank you for choosing [facility]!

## 2020-03-02 ENCOUNTER — Ambulatory Visit: Payer: Commercial Managed Care - PPO | Admitting: Gastroenterology

## 2020-03-10 ENCOUNTER — Other Ambulatory Visit (HOSPITAL_COMMUNITY): Payer: Self-pay | Admitting: *Deleted

## 2020-03-10 DIAGNOSIS — D508 Other iron deficiency anemias: Secondary | ICD-10-CM

## 2020-03-11 ENCOUNTER — Other Ambulatory Visit: Payer: Self-pay

## 2020-03-11 ENCOUNTER — Inpatient Hospital Stay (HOSPITAL_COMMUNITY): Payer: Commercial Managed Care - PPO | Attending: Hematology

## 2020-03-11 DIAGNOSIS — D508 Other iron deficiency anemias: Secondary | ICD-10-CM | POA: Diagnosis present

## 2020-03-11 LAB — IRON AND TIBC
Iron: 80 ug/dL (ref 28–170)
Saturation Ratios: 26 % (ref 10.4–31.8)
TIBC: 305 ug/dL (ref 250–450)
UIBC: 225 ug/dL

## 2020-03-11 LAB — CBC WITH DIFFERENTIAL/PLATELET
Abs Immature Granulocytes: 0.02 10*3/uL (ref 0.00–0.07)
Basophils Absolute: 0 10*3/uL (ref 0.0–0.1)
Basophils Relative: 0 %
Eosinophils Absolute: 0.1 10*3/uL (ref 0.0–0.5)
Eosinophils Relative: 1 %
HCT: 42.8 % (ref 36.0–46.0)
Hemoglobin: 14.1 g/dL (ref 12.0–15.0)
Immature Granulocytes: 0 %
Lymphocytes Relative: 23 %
Lymphs Abs: 1.6 10*3/uL (ref 0.7–4.0)
MCH: 29.3 pg (ref 26.0–34.0)
MCHC: 32.9 g/dL (ref 30.0–36.0)
MCV: 89 fL (ref 80.0–100.0)
Monocytes Absolute: 0.4 10*3/uL (ref 0.1–1.0)
Monocytes Relative: 5 %
Neutro Abs: 4.9 10*3/uL (ref 1.7–7.7)
Neutrophils Relative %: 71 %
Platelets: 185 10*3/uL (ref 150–400)
RBC: 4.81 MIL/uL (ref 3.87–5.11)
RDW: 12.4 % (ref 11.5–15.5)
WBC: 6.9 10*3/uL (ref 4.0–10.5)
nRBC: 0 % (ref 0.0–0.2)

## 2020-03-11 LAB — VITAMIN B12: Vitamin B-12: 320 pg/mL (ref 180–914)

## 2020-03-11 LAB — FERRITIN: Ferritin: 107 ng/mL (ref 11–307)

## 2020-03-11 LAB — FOLATE: Folate: 12.9 ng/mL (ref >5.9)

## 2020-03-15 ENCOUNTER — Inpatient Hospital Stay (HOSPITAL_BASED_OUTPATIENT_CLINIC_OR_DEPARTMENT_OTHER): Payer: Commercial Managed Care - PPO | Admitting: Nurse Practitioner

## 2020-03-15 ENCOUNTER — Other Ambulatory Visit: Payer: Self-pay

## 2020-03-15 DIAGNOSIS — D508 Other iron deficiency anemias: Secondary | ICD-10-CM

## 2020-03-15 NOTE — Assessment & Plan Note (Addendum)
1.  Iron deficiency anemia: -Last menstrual period was 2 to 3 months ago.  She has been on depo-Provera shots for birth control. -She last received 4 infusions of Venofer with the last one being on 08/05/2019. -Labs on 03/15/2020 showed hemoglobin 14.1, ferritin 107, percent saturation 26 -I do not recommend any IV iron at this time.  We will continue to monitor. -She will follow-up in 4 months with repeat labs.  2.  Indirect hyperbilirubinemia: -She was found to have elevated total bilirubin on 04/01/2019 at 2.7. -LDH was mildly elevated.  It was predominantly indirect bilirubin.  Direct Coombs test was negative for IgG and complement. -DAT negative hemolytic anemia evaluation [place] labs were also negative.  Last total bilirubin was 1.8 in April. -Differential diagnosis includes Gilbert's syndrome. -Most recent labs from 12/24/2019 revealed an increase in total bilirubin of 2.8, direct bilirubin is 2.6, indirect bilirubin 0.2.  Patient also reports right upper quadrant pain.  Patient was referred to GI for proper management and work-up. -GI did upper endoscopy on 01/15/2020 with biopsies.  Did showed mild chronic gastritis.  No other pathological findings.  No source of bleeding. -She will continue to follow-up with GI

## 2020-03-15 NOTE — Progress Notes (Signed)
[facility] [address], [place] [address]   CLINIC:  Medical Oncology/Hematology  PCP:  Lawerance Sabal, [place] [address] [place] [address] [phone]   REASON FOR VISIT: Follow-up for iron deficiency anemia  CURRENT THERAPY: Intermittent iron infusions   INTERVAL HISTORY:  [name] 25 y.o. female called for a telephone visit today for iron deficiency anemia.  Patient reports she is still fatigued throughout the day.  She reports her body does feel achy when she is not moving around as much.  She denies any bright red bleeding per rectum or melena.  She denies any easy bruising or bleeding. Denies any nausea, vomiting, or diarrhea. Denies any new pains. Had not noticed any recent bleeding such as epistaxis, hematuria or hematochezia. Denies recent chest pain on exertion, shortness of breath on minimal exertion, pre-syncopal episodes, or palpitations. Denies any numbness or tingling in hands or feet. Denies any recent fevers, infections, or recent hospitalizations. Patient reports appetite at 100% and energy level at 50%.  She is eating well maintain her weight at this time.     REVIEW OF SYSTEMS:  Review of Systems  Constitutional: Positive for fatigue.  All other systems reviewed and are negative.    PAST MEDICAL/SURGICAL HISTORY:  Past Medical History:  Diagnosis Date  . Allergy   . Anxiety and depression   . Asthma   . Birth defect   . Blood transfusion without reported diagnosis   . Clotting disorder (HCC)   . Complication of anesthesia    'I had stridor after my 1st D&C but with my 2nd one they gave me a breathing treatment before my surgery and I did ok".  . Cyst of left kidney    frequent cysts to kidneys  . Dysrhythmia    h/o SVT  . Family history of adverse reaction to anesthesia   . GERD (gastroesophageal reflux disease)   . Headache   . History of recurrent miscarriages   . Hypoglycemia   . Lupus anticoagulant positive   .  Neuropathic pain   . Palpitations    Past Surgical History:  Procedure Laterality Date  . CESAREAN SECTION    . CHOLECYSTECTOMY N/A 07/23/2018   Procedure: LAPAROSCOPIC CHOLECYSTECTOMY;  Surgeon: Carol Roers, MD;  Location: [facility];  Service: General;  Laterality: N/A;  . DILATION AND CURETTAGE OF UTERUS N/A 01/29/2017   Procedure: SUCTION DILATATION AND CURETTAGE;  Surgeon: Tilda Burrow, MD;  Location: [facility];  Service: Gynecology;  Laterality: N/A;  . DILATION AND CURETTAGE OF UTERUS N/A 05/01/2017   Procedure: SUCTION DILATATION AND CURETTAGE;  Surgeon: Lazaro Arms, MD;  Location: [facility];  Service: Gynecology;  Laterality: N/A;  pt to arrive at 7:15 to have labwork  . FETOSCOPIC LASER PHOTOCOAGULATION  11/14/2017   of 34 placental vessels for TTTS  . WISDOM TOOTH EXTRACTION       SOCIAL HISTORY:  Social History   Socioeconomic History  . Marital status: Single    Spouse name: Not on file  . Number of children: Not on file  . Years of education: Not on file  . Highest education level: Not on file  Occupational History  . Occupation: Loss adjuster, chartered  . Occupation: Psychologist, sport and exercise  Tobacco Use  . Smoking status: Never Smoker  . Smokeless tobacco: Never Used  Substance and Sexual Activity  . Alcohol use: No    Alcohol/week: 0.0 standard drinks  . Drug use: No  . Sexual  activity: Not Currently    Birth control/protection: None  Other Topics Concern  . Not on file  Social History Narrative  . Not on file   Social Determinants of Health   Financial Resource Strain:   . Difficulty of Paying Living Expenses:   Food Insecurity:   . Worried About Charity fundraiser in the Last Year:   . Arboriculturist in the Last Year:   Transportation Needs:   . Film/video editor (Medical):   Marland Kitchen Lack of Transportation (Non-Medical):   Physical Activity:   . Days of Exercise per Week:   . Minutes of Exercise per Session:   Stress:   . Feeling of Stress :   Social  Connections:   . Frequency of Communication with Friends and Family:   . Frequency of Social Gatherings with Friends and Family:   . Attends Religious Services:   . Active Member of Clubs or Organizations:   . Attends Archivist Meetings:   Marland Kitchen Marital Status:   Intimate Partner Violence:   . Fear of Current or Ex-Partner:   . Emotionally Abused:   Marland Kitchen Physically Abused:   . Sexually Abused:     FAMILY HISTORY:  Family History  Adopted: Yes  Problem Relation Age of Onset  . Migraines Sister   . Hypertension Maternal Grandmother   . Skin cancer Maternal Grandmother   . Heart disease Maternal Grandfather     CURRENT MEDICATIONS:  Outpatient Encounter Medications as of 03/15/2020  Medication Sig  . acetaminophen (TYLENOL) 500 MG tablet Take 500 mg by mouth every 6 (six) hours as needed for moderate pain, fever or headache.  . albuterol (VENTOLIN HFA) 108 (90 Base) MCG/ACT inhaler Inhale 2 puffs into the lungs every 6 (six) hours as needed for wheezing or shortness of breath.  Marland Kitchen aspirin EC 81 MG tablet Take 81 mg by mouth every 4 (four) hours as needed.  . clonazePAM (KLONOPIN) 0.5 MG tablet   . dicyclomine (BENTYL) 20 MG tablet Take 1 tablet (20 mg total) by mouth 4 (four) times daily as needed for spasms.  . famotidine (PEPCID) 20 MG tablet Take 1 tablet (20 mg total) by mouth 2 (two) times daily.  Marland Kitchen gabapentin (NEURONTIN) 300 MG capsule Take 300 mg by mouth 2 (two) times daily.  Marland Kitchen lidocaine (LIDODERM) 5 % Place 1 patch onto the skin daily as needed (back pain). May wear up to 12 hours  . medroxyPROGESTERone (DEPO-PROVERA) 150 MG/ML injection Inject 1 mL (150 mg total) into the muscle every 3 (three) months.  . methocarbamol (ROBAXIN) 500 MG tablet methocarbamol 500 mg tablet  TAKE 1 TABLET BY MOUTH TWICE A DAY AS NEEDED  . omeprazole (PRILOSEC) 40 MG capsule Take 40 mg by mouth 2 (two) times daily.  . sucralfate (CARAFATE) 1 GM/10ML suspension Take 10 mLs (1 g total) by  mouth 2 (two) times daily for 14 days.  Marland Kitchen zolpidem (AMBIEN) 5 MG tablet Take 1 tablet (5 mg total) by mouth at bedtime as needed for sleep.   Facility-Administered Encounter Medications as of 03/15/2020  Medication  . 0.9 %  sodium chloride infusion    ALLERGIES:  Allergies  Allergen Reactions  . Pecan Extract Allergy Skin Test Rash and Swelling  . Pecan Nut (Diagnostic) Hives  . Reglan [Metoclopramide] Hives    Vital sign: -Deferred due to telephone visit.   Physical Exam -Deferred due to telephone visit -Patient was alert and oriented over the phone and in  no acute distress.   LABORATORY DATA:  I have reviewed the labs as listed.  CBC    Component Value Date/Time   WBC 6.9 03/11/2020 1220   RBC 4.81 03/11/2020 1220   HGB 14.1 03/11/2020 1220   HGB 12.1 04/28/2018 1611   HCT 42.8 03/11/2020 1220   HCT 35.5 04/28/2018 1611   PLT 185 03/11/2020 1220   PLT 187 04/28/2018 1611   MCV 89.0 03/11/2020 1220   MCV 83 04/28/2018 1611   MCH 29.3 03/11/2020 1220   MCHC 32.9 03/11/2020 1220   RDW 12.4 03/11/2020 1220   RDW 14.0 04/28/2018 1611   LYMPHSABS 1.6 03/11/2020 1220   LYMPHSABS 1.4 04/28/2018 1611   MONOABS 0.4 03/11/2020 1220   EOSABS 0.1 03/11/2020 1220   EOSABS 0.1 04/28/2018 1611   BASOSABS 0.0 03/11/2020 1220   BASOSABS 0.0 04/28/2018 1611   CMP Latest Ref Rng & Units 01/15/2020 12/24/2019 04/22/2019  Glucose 70 - 99 mg/dL 86 - -  BUN 6 - 20 mg/dL 12 - -  Creatinine 2.13 - 1.00 mg/dL 0.86 - -  Sodium 578 - 145 mmol/L 142 - -  Potassium 3.5 - 5.1 mmol/L 3.9 - -  Chloride 98 - 111 mmol/L 108 - -  CO2 22 - 32 mmol/L - - -  Calcium 8.9 - 10.3 mg/dL - - -  Total Protein 6.5 - 8.1 g/dL - 7.5 7.9  Total Bilirubin 0.3 - 1.2 mg/dL - 2.8(H) 1.8(H)  Alkaline Phos 38 - 126 U/L - 48 44  AST 15 - 41 U/L - 17 20  ALT 0 - 44 U/L - 15 22   All questions were answered to patient's stated satisfaction. Encouraged patient to call with any new concerns or questions before  his next visit to the cancer center and we can certain see him sooner, if needed.      ASSESSMENT & PLAN:   Iron deficiency anemia 1.  Iron deficiency anemia: -Last menstrual period was 2 to 3 months ago.  She has been on depo-Provera shots for birth control. -She last received 4 infusions of Venofer with the last one being on 08/05/2019. -Labs on 03/15/2020 showed hemoglobin 14.1, ferritin 107, percent saturation 26 -I do not recommend any IV iron at this time.  We will continue to monitor. -She will follow-up in 4 months with repeat labs.  2.  Indirect hyperbilirubinemia: -She was found to have elevated total bilirubin on 04/01/2019 at 2.7. -LDH was mildly elevated.  It was predominantly indirect bilirubin.  Direct Coombs test was negative for IgG and complement. -DAT negative hemolytic anemia evaluation [place] labs were also negative.  Last total bilirubin was 1.8 in April. -Differential diagnosis includes Gilbert's syndrome. -Most recent labs from 12/24/2019 revealed an increase in total bilirubin of 2.8, direct bilirubin is 2.6, indirect bilirubin 0.2.  Patient also reports right upper quadrant pain.  Patient was referred to GI for proper management and work-up. -GI did upper endoscopy on 01/15/2020 with biopsies.  Did showed mild chronic gastritis.  No other pathological findings.  No source of bleeding. -She will continue to follow-up with GI      Orders placed this encounter:  Orders Placed This Encounter  Procedures  . Lactate dehydrogenase  . Methylmalonic acid, serum  . CBC with Differential/Platelet  . Comprehensive metabolic panel  . Ferritin  . Iron and TIBC  . Vitamin B12  . VITAMIN D 25 Hydroxy (Vit-D Deficiency, Fractures)  . Folate    I provided 22  minutes of non face-to-face telephone visit time during this encounter, and > 50% was spent counseling as documented under my assessment & plan.  Mathis Bud, FNP-C [facility] [phone]

## 2020-03-15 NOTE — Patient Instructions (Signed)
[facility] at [facility] Discharge Instructions  Follow up in 4 months with labs    Thank you for choosing [facility] at [facility] to provide your oncology and hematology care.  To afford each patient quality time with our provider, please arrive at least 15 minutes before your scheduled appointment time.   If you have a lab appointment with the Cancer Center please come in thru the Main Entrance and check in at the main information desk.  You need to re-schedule your appointment should you arrive 10 or more minutes late.  We strive to give you quality time with our providers, and arriving late affects you and other patients whose appointments are after yours.  Also, if you no show three or more times for appointments you may be dismissed from the clinic at the providers discretion.     Again, thank you for choosing [facility].  Our hope is that these requests will decrease the amount of time that you wait before being seen by our physicians.       _____________________________________________________________  Should you have questions after your visit to [facility], please contact our office at [phone] between the hours of 8:00 a.m. and 4:30 p.m.  Voicemails left after 4:00 p.m. will not be returned until the following business day.  For prescription refill requests, have your pharmacy contact our office and allow 72 hours.    Due to Covid, you will need to wear a mask upon entering the hospital. If you do not have a mask, a mask will be given to you at the Main Entrance upon arrival. For doctor visits, patients may have 1 support person with them. For treatment visits, patients can not have anyone with them due to social distancing guidelines and our immunocompromised population.      

## 2020-03-23 ENCOUNTER — Other Ambulatory Visit (HOSPITAL_COMMUNITY): Payer: Commercial Managed Care - PPO

## 2020-03-30 ENCOUNTER — Ambulatory Visit (HOSPITAL_COMMUNITY): Payer: Commercial Managed Care - PPO | Admitting: Nurse Practitioner

## 2020-06-10 ENCOUNTER — Encounter: Payer: Self-pay | Admitting: Women's Health

## 2020-06-10 ENCOUNTER — Telehealth (INDEPENDENT_AMBULATORY_CARE_PROVIDER_SITE_OTHER): Payer: Commercial Managed Care - PPO | Admitting: Women's Health

## 2020-06-10 VITALS — Ht 66.0 in | Wt 156.0 lb

## 2020-06-10 DIAGNOSIS — Z3009 Encounter for other general counseling and advice on contraception: Secondary | ICD-10-CM

## 2020-06-10 NOTE — Progress Notes (Signed)
   TELEHEALTH VIRTUAL GYN VISIT ENCOUNTER NOTE Patient name: Carol Bernard MRN [id number]  Date of birth: [birth year]  I connected with patient on 06/10/20 at  8:50 AM EDT by MyChart video and verified that I am speaking with the correct person using two identifiers.  Pt is not currently in the office, she is at hospital w/ partner who is having surgery today.  Provider is in the office.    I discussed the limitations, risks, security and privacy concerns of performing an evaluation and management service by telephone and the availability of in person appointments. I also discussed with the patient that there may be a patient responsible charge related to this service. The patient expressed understanding and agreed to proceed.   Chief Complaint:   IUD consult (has appt on Monday)  History of Present Illness:   Carol Bernard is a 25 y.o. [phone] Caucasian female being evaluated today to discuss getting IUD. Has appt on Monday. She is currently still covered by her last depo shot. Last sex was on 6/7. Wants paragard d/t no hormones.  Depression screen [facility] 2/9 04/28/2018 10/04/2017  Decreased Interest 0 0  Down, Depressed, Hopeless 0 0  PHQ - 2 Score 0 0  Altered sleeping 0 0  Tired, decreased energy 1 0  Change in appetite 0 0  Feeling bad or failure about yourself  0 0  Trouble concentrating 0 0  Moving slowly or fidgety/restless 0 0  Suicidal thoughts 0 0  PHQ-9 Score 1 0  Difficult doing work/chores Not difficult at all -    No LMP recorded. Patient has had an injection. The current method of family planning is Depo-Provera injections.  Last pap 04/28/18. Results were:  normal Review of Systems:   Pertinent items are noted in HPI Denies fever/chills, dizziness, headaches, visual disturbances, fatigue, shortness of breath, chest pain, abdominal pain, vomiting, abnormal vaginal discharge/itching/odor/irritation, problems with periods, bowel movements, urination, or intercourse unless  otherwise stated above.  Pertinent History Reviewed:  Reviewed past medical,surgical, social, obstetrical and family history.  Reviewed problem list, medications and allergies. Physical Assessment:   Vitals:   06/10/20 0841  Weight: 156 lb (70.8 kg)  Height: 5\' 6"  (1.676 m)  Body mass index is 25.18 kg/m.       Physical Examination:   General:  Alert, oriented and cooperative.   Mental Status: Normal mood and affect perceived. Normal judgment and thought content.  Physical exam deferred due to nature of the encounter  No results found for this or any previous visit (from the past 24 hour(s)).  Assessment & Plan:  1) Contraception counseling> no sex until after IUD insertion  Meds: No orders of the defined types were placed in this encounter.   No orders of the defined types were placed in this encounter.   I discussed the assessment and treatment plan with the patient. The patient was provided an opportunity to ask questions and all were answered. The patient agreed with the plan and demonstrated an understanding of the instructions.   The patient was advised to call back or seek an in-person evaluation/go to the ED if the symptoms worsen or if the condition fails to improve as anticipated.  I provided 15 minutes of non-face-to-face time during this encounter.   Return for As scheduled Monday for IUD insertion.  Sunday CNM, [facility] 06/10/2020 9:09 AM

## 2020-06-13 ENCOUNTER — Encounter: Payer: Self-pay | Admitting: Women's Health

## 2020-06-13 ENCOUNTER — Ambulatory Visit (INDEPENDENT_AMBULATORY_CARE_PROVIDER_SITE_OTHER): Payer: Commercial Managed Care - PPO | Admitting: Women's Health

## 2020-06-13 VITALS — BP 116/78 | HR 83 | Ht 66.0 in | Wt 156.0 lb

## 2020-06-13 DIAGNOSIS — Z3202 Encounter for pregnancy test, result negative: Secondary | ICD-10-CM | POA: Diagnosis not present

## 2020-06-13 DIAGNOSIS — Z30430 Encounter for insertion of intrauterine contraceptive device: Secondary | ICD-10-CM | POA: Diagnosis not present

## 2020-06-13 LAB — POCT URINE PREGNANCY: Preg Test, Ur: NEGATIVE

## 2020-06-13 MED ORDER — PARAGARD INTRAUTERINE COPPER IU IUD: INTRAUTERINE_SYSTEM | Freq: Once | INTRAUTERINE | Status: AC

## 2020-06-13 NOTE — Patient Instructions (Signed)
 Nothing in vagina for 3 days (no sex, douching, tampons, etc...)  Check your strings once a month to make sure you can feel them, if you are not able to please let us know  If you develop a fever of 100.4 or more in the next few weeks, or if you develop severe abdominal pain, please let us know  Use a backup method of birth control, such as condoms, for 2 weeks     Intrauterine Device Insertion, Care After  This sheet gives you information about how to care for yourself after your procedure. Your health care provider may also give you more specific instructions. If you have problems or questions, contact your health care provider. What can I expect after the procedure? After the procedure, it is common to have:  Cramps and pain in the abdomen.  Light bleeding (spotting) or heavier bleeding that is like your menstrual period. This may last for up to a few days.  Lower back pain.  Dizziness.  Headaches.  Nausea. Follow these instructions at home:  Before resuming sexual activity, check to make sure that you can feel the IUD string(s). You should be able to feel the end of the string(s) below the opening of your cervix. If your IUD string is in place, you may resume sexual activity. ? If you had a hormonal IUD inserted more than 7 days after your most recent period started, you will need to use a backup method of birth control for 7 days after IUD insertion. Ask your health care provider whether this applies to you.  Continue to check that the IUD is still in place by feeling for the string(s) after every menstrual period, or once a month.  Take over-the-counter and prescription medicines only as told by your health care provider.  Do not drive or use heavy machinery while taking prescription pain medicine.  Keep all follow-up visits as told by your health care provider. This is important. Contact a health care provider if:  You have bleeding that is heavier or lasts longer  than a normal menstrual cycle.  You have a fever.  You have cramps or abdominal pain that get worse or do not get better with medicine.  You develop abdominal pain that is new or is not in the same area of earlier cramping and pain.  You feel lightheaded or weak.  You have abnormal or bad-smelling discharge from your vagina.  You have pain during sexual activity.  You have any of the following problems with your IUD string(s): ? The string bothers or hurts you or your sexual partner. ? You cannot feel the string. ? The string has gotten longer.  You can feel the IUD in your vagina.  You think you may be pregnant, or you miss your menstrual period.  You think you may have an STI (sexually transmitted infection). Get help right away if:  You have flu-like symptoms.  You have a fever and chills.  You can feel that your IUD has slipped out of place. Summary  After the procedure, it is common to have cramps and pain in the abdomen. It is also common to have light bleeding (spotting) or heavier bleeding that is like your menstrual period.  Continue to check that the IUD is still in place by feeling for the string(s) after every menstrual period, or once a month.  Keep all follow-up visits as told by your health care provider. This is important.  Contact your health care provider   if you have problems with your IUD string(s), such as the string getting longer or bothering you or your sexual partner. This information is not intended to replace advice given to you by your health care provider. Make sure you discuss any questions you have with your health care provider. Document Revised: 11/29/2017 Document Reviewed: 11/07/2016 Elsevier Patient Education  2020 Elsevier Inc.  

## 2020-06-13 NOTE — Progress Notes (Signed)
   IUD INSERTION Patient name: Carol Bernard MRN [id number]  Date of birth: 30-Aug-1995 Subjective Findings:   Carol Bernard is a 25 y.o. [phone] Caucasian female being seen today for insertion of a Paragard IUD.  Depression screen [facility] 2/9 04/28/2018 10/04/2017  Decreased Interest 0 0  Down, Depressed, Hopeless 0 0  PHQ - 2 Score 0 0  Altered sleeping 0 0  Tired, decreased energy 1 0  Change in appetite 0 0  Feeling bad or failure about yourself  0 0  Trouble concentrating 0 0  Moving slowly or fidgety/restless 0 0  Suicidal thoughts 0 0  PHQ-9 Score 1 0  Difficult doing work/chores Not difficult at all -    No LMP recorded. Patient has had an injection. Last sexual intercourse was ~1wk ago, still covered by last depo injection on 3/23 Last pap4/29/19. Results were:  normal  The risks and benefits of the method and placement have been thouroughly reviewed with the patient and all questions were answered.  Specifically the patient is aware of failure rate of 12/998, expulsion of the IUD and of possible perforation.  The patient is aware of irregular bleeding due to the method and understands the incidence of irregular bleeding diminishes with time.  Signed copy of informed consent in chart.  Pertinent History Reviewed:   Reviewed past medical,surgical, social, obstetrical and family history.  Reviewed problem list, medications and allergies. Objective Findings & Procedure:   Vitals:   06/13/20 1059  BP: 116/78  Pulse: 83  Weight: 156 lb (70.8 kg)  Height: 5\' 6"  (1.676 m)  Body mass index is 25.18 kg/m.  Results for orders placed or performed in visit on 06/13/20 (from the past 24 hour(s))  POCT urine pregnancy   Collection Time: 06/13/20 11:12 AM  Result Value Ref Range   Preg Test, Ur Negative Negative     Time out was performed.  A graves speculum was placed in the vagina.  The cervix was visualized, prepped using Betadine, and grasped with a single tooth tenaculum. The  uterus was found to be retroflexed and it sounded to 8 cm.  Paragard  IUD placed per manufacturer's recommendations. The strings were trimmed to approximately 3 cm. The patient tolerated the procedure well.   Informal transvaginal sonogram was performed and the proper placement of the IUD was verified.  Chaperone: 06/15/20 & Plan:   1) Paragard IUD insertion The patient was given post procedure instructions, including signs and symptoms of infection and to check for the strings after each menses or each month, and refraining from intercourse or anything in the vagina for 3 days. She was given a care card with date IUD placed, and date IUD to be removed. She is scheduled for a f/u appointment in 4 weeks.  Orders Placed This Encounter  Procedures  . POCT urine pregnancy    Return in about 4 weeks (around 07/11/2020) for IUD F/U, in person, CNM.  09/11/2020 CNM, [facility] 06/13/2020 11:42 AM

## 2020-06-13 NOTE — Addendum Note (Signed)
Addended by: Federico Flake A on: 06/13/2020 11:58 AM   Modules accepted: Orders

## 2020-06-14 ENCOUNTER — Encounter (HOSPITAL_COMMUNITY): Payer: Self-pay | Admitting: Nurse Practitioner

## 2020-06-15 ENCOUNTER — Telehealth (HOSPITAL_COMMUNITY): Payer: Self-pay | Admitting: *Deleted

## 2020-06-15 NOTE — Telephone Encounter (Signed)
Pt called clinic and left message about FMLA papers. I attempted to contact the pt but the voice mail was full and I was unable to leave a message.

## 2020-07-11 ENCOUNTER — Ambulatory Visit (INDEPENDENT_AMBULATORY_CARE_PROVIDER_SITE_OTHER): Payer: Commercial Managed Care - PPO | Admitting: Women's Health

## 2020-07-11 ENCOUNTER — Encounter: Payer: Self-pay | Admitting: Women's Health

## 2020-07-11 VITALS — BP 107/72 | HR 87 | Ht 66.0 in | Wt 157.8 lb

## 2020-07-11 DIAGNOSIS — G629 Polyneuropathy, unspecified: Secondary | ICD-10-CM

## 2020-07-11 DIAGNOSIS — Z30431 Encounter for routine checking of intrauterine contraceptive device: Secondary | ICD-10-CM

## 2020-07-11 MED ORDER — GABAPENTIN 300 MG PO CAPS: 300.0000 mg | ORAL_CAPSULE | Freq: Two times a day (BID) | ORAL | 11 refills | Status: AC

## 2020-07-11 NOTE — Progress Notes (Signed)
   GYN VISIT Patient name: Carol Bernard MRN [id number]  Date of birth: 1995-07-02 Chief Complaint:   Follow-up  History of Present Illness:   Carol Bernard is a 25 y.o. [phone] Caucasian female being seen today for IUD check. Paragard IUD inserted 06/13/20. No problems. Would like refill on neurontin, [facility] had rx'd it for her after delivery of twins for neuropathy in feet, worked well. Stopped taking it for awhile to see if she still needed it and pain in feet returned.  Depression screen [facility] 2/9 04/28/2018 10/04/2017  Decreased Interest 0 0  Down, Depressed, Hopeless 0 0  PHQ - 2 Score 0 0  Altered sleeping 0 0  Tired, decreased energy 1 0  Change in appetite 0 0  Feeling bad or failure about yourself  0 0  Trouble concentrating 0 0  Moving slowly or fidgety/restless 0 0  Suicidal thoughts 0 0  PHQ-9 Score 1 0  Difficult doing work/chores Not difficult at all -    No LMP recorded. (Menstrual status: IUD). The current method of family planning is IUD.  Last pap 04/28/18. Results were:  normal Review of Systems:   Pertinent items are noted in HPI Denies fever/chills, dizziness, headaches, visual disturbances, fatigue, shortness of breath, chest pain, abdominal pain, vomiting, abnormal vaginal discharge/itching/odor/irritation, problems with periods, bowel movements, urination, or intercourse unless otherwise stated above.  Pertinent History Reviewed:  Reviewed past medical,surgical, social, obstetrical and family history.  Reviewed problem list, medications and allergies. Physical Assessment:   Vitals:   07/11/20 1347  BP: 107/72  Pulse: 87  Weight: 157 lb 12.8 oz (71.6 kg)  Height: 5\' 6"  (1.676 m)  Body mass index is 25.47 kg/m.       Physical Examination:   General appearance: alert, well appearing, and in no distress  Mental status: alert, oriented to person, place, and time  Skin: warm & dry   Cardiovascular: normal heart rate noted  Respiratory: normal respiratory effort,  no distress  Abdomen: soft, non-tender   Pelvic: VULVA: normal appearing vulva with no masses, tenderness or lesions, VAGINA: normal appearing vagina with normal color and discharge, no lesions, CERVIX: normal appearing cervix without discharge or lesions, IUD strings visible and appropriate length  Extremities: no edema   Chaperone: Amanda Rash    No results found for this or any previous visit (from the past 24 hour(s)).  Assessment & Plan:  1) IUD check>  In place  2) Neuropathy-feet> rx neurontin  Meds:  Meds ordered this encounter  Medications  . gabapentin (NEURONTIN) 300 MG capsule    Sig: Take 1 capsule (300 mg total) by mouth 2 (two) times daily.    Dispense:  60 capsule    Refill:  11    Order Specific Question:   Supervising Provider    Answer:   H [[id number]]    No orders of the defined types were placed in this encounter.   Return in about 1 year (around 07/11/2021) for Pap & physical.  09/11/2021 CNM, [facility] 07/11/2020 2:12 PM

## 2020-07-15 ENCOUNTER — Other Ambulatory Visit (HOSPITAL_COMMUNITY): Payer: Commercial Managed Care - PPO

## 2020-07-15 ENCOUNTER — Other Ambulatory Visit: Payer: Self-pay

## 2020-07-15 ENCOUNTER — Inpatient Hospital Stay (HOSPITAL_COMMUNITY): Payer: Commercial Managed Care - PPO | Attending: Hematology

## 2020-07-15 DIAGNOSIS — D508 Other iron deficiency anemias: Secondary | ICD-10-CM

## 2020-07-15 LAB — FERRITIN: Ferritin: 112 ng/mL (ref 11–307)

## 2020-07-15 LAB — CBC WITH DIFFERENTIAL/PLATELET
Abs Immature Granulocytes: 0.02 10*3/uL (ref 0.00–0.07)
Basophils Absolute: 0 10*3/uL (ref 0.0–0.1)
Basophils Relative: 0 %
Eosinophils Absolute: 0.1 10*3/uL (ref 0.0–0.5)
Eosinophils Relative: 2 %
HCT: 41.9 % (ref 36.0–46.0)
Hemoglobin: 13.7 g/dL (ref 12.0–15.0)
Immature Granulocytes: 0 %
Lymphocytes Relative: 23 %
Lymphs Abs: 1.5 10*3/uL (ref 0.7–4.0)
MCH: 29.4 pg (ref 26.0–34.0)
MCHC: 32.7 g/dL (ref 30.0–36.0)
MCV: 89.9 fL (ref 80.0–100.0)
Monocytes Absolute: 0.3 10*3/uL (ref 0.1–1.0)
Monocytes Relative: 5 %
Neutro Abs: 4.7 10*3/uL (ref 1.7–7.7)
Neutrophils Relative %: 70 %
Platelets: 193 10*3/uL (ref 150–400)
RBC: 4.66 MIL/uL (ref 3.87–5.11)
RDW: 13.1 % (ref 11.5–15.5)
WBC: 6.6 10*3/uL (ref 4.0–10.5)
nRBC: 0 % (ref 0.0–0.2)

## 2020-07-15 LAB — COMPREHENSIVE METABOLIC PANEL
ALT: 22 U/L (ref 0–44)
AST: 17 U/L (ref 15–41)
Albumin: 4.6 g/dL (ref 3.5–5.0)
Alkaline Phosphatase: 53 U/L (ref 38–126)
Anion gap: 7 (ref 5–15)
BUN: 13 mg/dL (ref 6–20)
CO2: 25 mmol/L (ref 22–32)
Calcium: 9 mg/dL (ref 8.9–10.3)
Chloride: 106 mmol/L (ref 98–111)
Creatinine, Ser: 0.62 mg/dL (ref 0.44–1.00)
GFR calc Af Amer: >60 mL/min (ref >60)
GFR calc non Af Amer: >60 mL/min (ref >60)
Glucose, Bld: 105 mg/dL — ABNORMAL HIGH (ref 70–99)
Potassium: 4.6 mmol/L (ref 3.5–5.1)
Sodium: 138 mmol/L (ref 135–145)
Total Bilirubin: 1.5 mg/dL — ABNORMAL HIGH (ref 0.3–1.2)
Total Protein: 7.5 g/dL (ref 6.5–8.1)

## 2020-07-15 LAB — VITAMIN B12: Vitamin B-12: 272 pg/mL (ref 180–914)

## 2020-07-15 LAB — IRON AND TIBC
Iron: 74 ug/dL (ref 28–170)
Saturation Ratios: 24 % (ref 10.4–31.8)
TIBC: 310 ug/dL (ref 250–450)
UIBC: 236 ug/dL

## 2020-07-15 LAB — FOLATE: Folate: 8.9 ng/mL (ref >5.9)

## 2020-07-15 LAB — VITAMIN D 25 HYDROXY (VIT D DEFICIENCY, FRACTURES): Vit D, 25-Hydroxy: 21.57 ng/mL — ABNORMAL LOW (ref 30–100)

## 2020-07-15 LAB — LACTATE DEHYDROGENASE: LDH: 131 U/L (ref 98–192)

## 2020-07-18 LAB — METHYLMALONIC ACID, SERUM: Methylmalonic Acid, Quantitative: 111 nmol/L (ref 0–378)

## 2020-07-19 ENCOUNTER — Inpatient Hospital Stay (HOSPITAL_BASED_OUTPATIENT_CLINIC_OR_DEPARTMENT_OTHER): Payer: Commercial Managed Care - PPO | Admitting: Nurse Practitioner

## 2020-07-19 ENCOUNTER — Ambulatory Visit (HOSPITAL_COMMUNITY): Payer: Commercial Managed Care - PPO | Admitting: Nurse Practitioner

## 2020-07-19 DIAGNOSIS — D508 Other iron deficiency anemias: Secondary | ICD-10-CM

## 2020-07-19 NOTE — Progress Notes (Signed)
[facility] Cancer Follow up:    Carol Bernard, [place] [address] [place] [address]   DIAGNOSIS: Iron deficiency anemia  CURRENT THERAPY: Intermittent iron infusions  INTERVAL HISTORY: Carol Bernard 25 y.o. female was called for a telephone visit for iron deficiency anemia.  Patient reports she is doing well since her last visit.  She denies any bright red bleeding per rectum or melena.  She denies any easy bruising or bleeding. Denies any nausea, vomiting, or diarrhea. Denies any new pains. Had not noticed any recent bleeding such as epistaxis, hematuria or hematochezia. Denies recent chest pain on exertion, shortness of breath on minimal exertion, pre-syncopal episodes, or palpitations. Denies any numbness or tingling in hands or feet. Denies any recent fevers, infections, or recent hospitalizations. Patient reports appetite at 75% and energy level at 75%.  She is eating well maintain her weight at this time.    Patient Active Problem List   Diagnosis Date Noted  . Neuropathy 07/11/2020  . Encounter for IUD insertion 06/13/2020  . Iron deficiency anemia 06/25/2019  . Lupus anticoagulant disorder (HCC) 04/28/2018  . Previous cesarean section 04/28/2018  . H/O Gestational thrombocytopenia (HCC) 08/17/2017  . 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  MEDICAL HISTORY: Past Medical History:  Diagnosis Date  . Allergy   . Anxiety and depression   . Asthma   . Birth defect   . Blood transfusion without reported diagnosis   . Clotting disorder (HCC)   . Complication of anesthesia    'I had stridor after my 1st D&C but with my 2nd one they gave me a breathing treatment before my surgery and I did ok".  . Cyst of left kidney    frequent cysts to kidneys  . Dysrhythmia    h/o SVT  . Family history of adverse reaction to anesthesia   . GERD (gastroesophageal  reflux disease)   . Headache   . History of recurrent miscarriages   . Hypoglycemia   . Lupus anticoagulant positive   . Neuropathic pain   . Palpitations     SURGICAL HISTORY: Past Surgical History:  Procedure Laterality Date  . CESAREAN SECTION    . CHOLECYSTECTOMY N/A 07/23/2018   Procedure: LAPAROSCOPIC CHOLECYSTECTOMY;  Surgeon: Lucretia Roers, MD;  Location: [facility];  Service: General;  Laterality: N/A;  . DILATION AND CURETTAGE OF UTERUS N/A 01/29/2017   Procedure: SUCTION DILATATION AND CURETTAGE;  Surgeon: Tilda Burrow, MD;  Location: [facility];  Service: Gynecology;  Laterality: N/A;  . DILATION AND CURETTAGE OF UTERUS N/A 05/01/2017   Procedure: SUCTION DILATATION AND CURETTAGE;  Surgeon: Lazaro Arms, MD;  Location: [facility];  Service: Gynecology;  Laterality: N/A;  pt to arrive at 7:15 to have labwork  . FETOSCOPIC LASER PHOTOCOAGULATION  11/14/2017   of 34 placental vessels for TTTS  . WISDOM TOOTH EXTRACTION      SOCIAL HISTORY: Social History   Socioeconomic History  . Marital status: Single    Spouse name: Not on file  . Number of children: Not on file  . Years of education: Not on file  . Highest education level: Not on file  Occupational History  . Occupation: Loss adjuster, chartered  . Occupation: Psychologist, sport and exercise  Tobacco Use  . Smoking status: Never Smoker  . Smokeless tobacco: Never Used  Vaping Use  . Vaping Use: Never used  Substance  and Sexual Activity  . Alcohol use: No    Alcohol/week: 0.0 standard drinks  . Drug use: No  . Sexual activity: Yes    Birth control/protection: Injection  Other Topics Concern  . Not on file  Social History Narrative  . Not on file   Social Determinants of Health   Financial Resource Strain:   . Difficulty of Paying Living Expenses:   Food Insecurity:   . Worried About Programme researcher, broadcasting/film/video in the Last Year:   . Barista in the Last Year:   Transportation Needs:   . Freight forwarder (Medical):   Marland Kitchen Lack  of Transportation (Non-Medical):   Physical Activity:   . Days of Exercise per Week:   . Minutes of Exercise per Session:   Stress:   . Feeling of Stress :   Social Connections:   . Frequency of Communication with Friends and Family:   . Frequency of Social Gatherings with Friends and Family:   . Attends Religious Services:   . Active Member of Clubs or Organizations:   . Attends Banker Meetings:   Marland Kitchen Marital Status:   Intimate Partner Violence:   . Fear of Current or Ex-Partner:   . Emotionally Abused:   Marland Kitchen Physically Abused:   . Sexually Abused:     FAMILY HISTORY: Family History  Adopted: Yes  Problem Relation Age of Onset  . Migraines Sister   . Hypertension Maternal Grandmother   . Skin cancer Maternal Grandmother   . Heart disease Maternal Grandfather     Review of Systems  All other systems reviewed and are negative.   Vital signs: -Deferred due to telephone visit  Physical Exam -Deferred due to telephone visit -Patient was alert and oriented over the phone and in no acute distress next  LABORATORY DATA:  CBC    Component Value Date/Time   WBC 6.6 07/15/2020 0842   RBC 4.66 07/15/2020 0842   HGB 13.7 07/15/2020 0842   HGB 12.1 04/28/2018 1611   HCT 41.9 07/15/2020 0842   HCT 35.5 04/28/2018 1611   PLT 193 07/15/2020 0842   PLT 187 04/28/2018 1611   MCV 89.9 07/15/2020 0842   MCV 83 04/28/2018 1611   MCH 29.4 07/15/2020 0842   MCHC 32.7 07/15/2020 0842   RDW 13.1 07/15/2020 0842   RDW 14.0 04/28/2018 1611   LYMPHSABS 1.5 07/15/2020 0842   LYMPHSABS 1.4 04/28/2018 1611   MONOABS 0.3 07/15/2020 0842   EOSABS 0.1 07/15/2020 0842   EOSABS 0.1 04/28/2018 1611   BASOSABS 0.0 07/15/2020 0842   BASOSABS 0.0 04/28/2018 1611    CMP     Component Value Date/Time   NA 138 07/15/2020 0842   NA 136 12/12/2017 1542   K 4.6 07/15/2020 0842   CL 106 07/15/2020 0842   CO2 25 07/15/2020 0842   GLUCOSE 105 (H) 07/15/2020 0842   BUN 13  07/15/2020 0842   BUN 6 12/12/2017 1542   CREATININE 0.62 07/15/2020 0842   CALCIUM 9.0 07/15/2020 0842   PROT 7.5 07/15/2020 0842   PROT 6.3 12/12/2017 1542   ALBUMIN 4.6 07/15/2020 0842   ALBUMIN 3.7 12/12/2017 1542   AST 17 07/15/2020 0842   ALT 22 07/15/2020 0842   ALKPHOS 53 07/15/2020 0842   BILITOT 1.5 (H) 07/15/2020 0842   BILITOT 0.9 12/12/2017 1542   GFRNONAA >60 07/15/2020 0842   GFRAA >60 07/15/2020 8937    All questions were answered to patient's stated  satisfaction. Encouraged patient to call with any new concerns or questions before his next visit to the cancer center and we can certain see him sooner, if needed.     ASSESSMENT and THERAPY PLAN:   Iron deficiency anemia 1.  Iron deficiency anemia: -Last menstrual period was 2 to 3 months ago.  She has been on depo-Provera shots for birth control. -She last received 4 infusions of Venofer with the last one being on 08/05/2019. -Labs on 07/15/2020 showed hemoglobin 13.7, ferritin 112, percent saturation 24 -I do not recommend any IV iron at this time.  We will continue to monitor. -She will follow-up in 6 months with repeat labs.  2.  Indirect hyperbilirubinemia: -She was found to have elevated total bilirubin on 04/01/2019 at 2.7. -LDH was mildly elevated.  It was predominantly indirect bilirubin.  Direct Coombs test was negative for IgG and complement. -DAT negative hemolytic anemia evaluation [place] labs were also negative.  Last total bilirubin was 1.8 in April. -Differential diagnosis includes Gilbert's syndrome. -Most recent labs from 12/24/2019 revealed an increase in total bilirubin of 2.8, direct bilirubin is 2.6, indirect bilirubin 0.2.  Patient also reports right upper quadrant pain.  Patient was referred to GI for proper management and work-up. -GI did upper endoscopy on 01/15/2020 with biopsies.  Did showed mild chronic gastritis.  No other pathological findings.  No source of bleeding. -Labs done on  06/16/2020 showed total bilirubin 1.5 -She will continue to follow-up with GI  3.  Vitamin D deficiency: -Labs done on 07/15/2020 showed vitamin D level 21.57 -Patient reports she takes vitamin D once daily. -We will change this to twice daily.    Orders Placed This Encounter  Procedures  . Lactate dehydrogenase    Standing Status:   Future    Standing Expiration Date:   07/19/2021  . CBC with Differential/Platelet    Standing Status:   Future    Standing Expiration Date:   07/19/2021  . Comprehensive metabolic panel    Standing Status:   Future    Standing Expiration Date:   07/19/2021  . Ferritin    Standing Status:   Future    Standing Expiration Date:   07/19/2021  . Iron and TIBC    Standing Status:   Future    Standing Expiration Date:   07/19/2021  . Vitamin B12    Standing Status:   Future    Standing Expiration Date:   07/19/2021  . VITAMIN D 25 Hydroxy (Vit-D Deficiency, Fractures)    Standing Status:   Future    Standing Expiration Date:   07/19/2021  . Folate    Standing Status:   Future    Standing Expiration Date:   07/19/2021    All questions were answered. The patient knows to call the clinic with any problems, questions or concerns. We can certainly see the patient much sooner if necessary. This note was electronically signed.  I provided 30 minutes of non face-to-face telephone visit time during this encounter, and > 50% was spent counseling as documented under my assessment & plan.   Doree Barthel, NP-C 07/19/2020

## 2020-07-19 NOTE — Assessment & Plan Note (Addendum)
1.  Iron deficiency anemia: -Last menstrual period was 2 to 3 months ago.  She has been on depo-Provera shots for birth control. -She last received 4 infusions of Venofer with the last one being on 08/05/2019. -Labs on 07/15/2020 showed hemoglobin 13.7, ferritin 112, percent saturation 24 -I do not recommend any IV iron at this time.  We will continue to monitor. -She will follow-up in 6 months with repeat labs.  2.  Indirect hyperbilirubinemia: -She was found to have elevated total bilirubin on 04/01/2019 at 2.7. -LDH was mildly elevated.  It was predominantly indirect bilirubin.  Direct Coombs test was negative for IgG and complement. -DAT negative hemolytic anemia evaluation [place] labs were also negative.  Last total bilirubin was 1.8 in April. -Differential diagnosis includes Gilbert's syndrome. -Most recent labs from 12/24/2019 revealed an increase in total bilirubin of 2.8, direct bilirubin is 2.6, indirect bilirubin 0.2.  Patient also reports right upper quadrant pain.  Patient was referred to GI for proper management and work-up. -GI did upper endoscopy on 01/15/2020 with biopsies.  Did showed mild chronic gastritis.  No other pathological findings.  No source of bleeding. -Labs done on 06/16/2020 showed total bilirubin 1.5 -She will continue to follow-up with GI  3.  Vitamin D deficiency: -Labs done on 07/15/2020 showed vitamin D level 21.57 -Patient reports she takes vitamin D once daily. -We will change this to twice daily.

## 2020-09-15 ENCOUNTER — Other Ambulatory Visit: Payer: Self-pay | Admitting: Obstetrics & Gynecology

## 2020-10-25 ENCOUNTER — Encounter: Payer: Self-pay | Admitting: Cardiology

## 2020-10-25 ENCOUNTER — Other Ambulatory Visit: Payer: Self-pay

## 2020-10-25 ENCOUNTER — Ambulatory Visit (INDEPENDENT_AMBULATORY_CARE_PROVIDER_SITE_OTHER): Payer: Commercial Managed Care - PPO | Admitting: Cardiology

## 2020-10-25 VITALS — BP 110/70 | HR 90 | Ht 66.0 in | Wt 159.0 lb

## 2020-10-25 DIAGNOSIS — R079 Chest pain, unspecified: Secondary | ICD-10-CM | POA: Diagnosis not present

## 2020-10-25 NOTE — Progress Notes (Signed)
Clinical Summary Carol Bernard is a 25 y.o.female seen today for follow up of the following medical problems.   1. Palpitations. - started about 1 year ago. Can have episodes at rest or with activity. Has checked her heart rates during episodes and noted as high as 175 bpm, sharp pain left sided chest pain. Feels weak, drained. Lasts a few minutes - occurs sporadically, difficult to calculate frequency.  - no caffeine, no EtoH.  - TSH 1.59   - 06/2016 event monitor without significant arrhythmias.  - no recent symptoms  2. Anxiety/Depression - followed by pcp  3. Chest pain - 09/2018 echo LVEF 60-65%, no WMAs  - mild chest spasms at times, over the last few days has increased - can be assocaited with palpitations.  Mid to right chest, squeezing like feeling. Worst 7/10 in severity. Can occur at rest or with activity. No palpitations, no SOB. Not positional, not tender to palpation. Can last a few minutes up to 30 minutes.  - can be better with aspirin.  Different from prior chest pain  - no specific exertional symptoms - grandfather MI in 13s, cousins died 43s  - episodes acting daily.   4. 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  -     Past Medical History:  Diagnosis Date  . Allergy   . Anxiety and depression   . Asthma   . Birth defect   . Blood transfusion without reported diagnosis   . Clotting disorder (HCC)   . Complication of anesthesia    'I had stridor after my 1st D&C but with my 2nd one they gave me a breathing treatment before my surgery and I did ok".  . Cyst of left kidney    frequent cysts to kidneys  . Dysrhythmia    h/o SVT  . Family history of adverse reaction to anesthesia   . GERD (gastroesophageal reflux disease)   . Headache   . History of recurrent miscarriages   . Hypoglycemia    . Lupus anticoagulant positive   . Neuropathic pain   . Palpitations      Allergies  Allergen Reactions  . Pecan Extract Allergy Skin Test Rash and Swelling  . Pecan Nut (Diagnostic) Hives  . Reglan [Metoclopramide] Hives     Current Outpatient Medications  Medication Sig Dispense Refill  . gabapentin (NEURONTIN) 300 MG capsule Take 1 capsule (300 mg total) by mouth 2 (two) times daily. 60 capsule 11  . HYDROcodone-acetaminophen (NORCO/VICODIN) 5-325 MG tablet Take 1 tablet by mouth 3 (three) times daily as needed.    . lidocaine (LIDODERM) 5 % Place 1 patch onto the skin daily as needed (back pain). May wear up to 12 hours    . medroxyPROGESTERone (DEPO-PROVERA) 150 MG/ML injection INJECT 1 ML (150 MG TOTAL) INTO THE MUSCLE EVERY 3 (THREE) MONTHS. 1 mL 3  . methocarbamol (ROBAXIN) 500 MG tablet methocarbamol 500 mg tablet  TAKE 1 TABLET BY MOUTH TWICE A DAY AS NEEDED    . omeprazole (PRILOSEC) 40 MG capsule Take 40 mg by mouth 2 (two) times daily.    . sucralfate (CARAFATE) 1 GM/10ML suspension Take 10 mLs (1 g total) by mouth 2 (two) times daily for 14 days. 280 mL 0   Current Facility-Administered Medications  Medication Dose Route Frequency  Provider Last Rate Last Admin  . 0.9 %  sodium chloride infusion  500 mL Intravenous Once Tressia Danas, MD         Past Surgical History:  Procedure Laterality Date  . CESAREAN SECTION    . CHOLECYSTECTOMY N/A 07/23/2018   Procedure: LAPAROSCOPIC CHOLECYSTECTOMY;  Surgeon: Carol Roers, MD;  Location: [facility];  Service: General;  Laterality: N/A;  . DILATION AND CURETTAGE OF UTERUS N/A 01/29/2017   Procedure: SUCTION DILATATION AND CURETTAGE;  Surgeon: Tilda Burrow, MD;  Location: [facility];  Service: Gynecology;  Laterality: N/A;  . DILATION AND CURETTAGE OF UTERUS N/A 05/01/2017   Procedure: SUCTION DILATATION AND CURETTAGE;  Surgeon: Lazaro Arms, MD;  Location: [facility];  Service: Gynecology;  Laterality: N/A;  pt to arrive  at 7:15 to have labwork  . FETOSCOPIC LASER PHOTOCOAGULATION  11/14/2017   of 34 placental vessels for TTTS  . WISDOM TOOTH EXTRACTION       Allergies  Allergen Reactions  . Pecan Extract Allergy Skin Test Rash and Swelling  . Pecan Nut (Diagnostic) Hives  . Reglan [Metoclopramide] Hives      Family History  Adopted: Yes  Problem Relation Age of Onset  . Migraines Sister   . Hypertension Maternal Grandmother   . Skin cancer Maternal Grandmother   . Heart disease Maternal Grandfather      Social History Ms. Wood reports that she has never smoked. She has never used smokeless tobacco. Carol Bernard reports no history of alcohol use.   Review of Systems CONSTITUTIONAL: No weight loss, fever, chills, weakness or fatigue.  HEENT: Eyes: No visual loss, blurred vision, double vision or yellow sclerae.No hearing loss, sneezing, congestion, runny nose or sore throat.  SKIN: No rash or itching.  CARDIOVASCULAR: per hpi RESPIRATORY: No shortness of breath, cough or sputum.  GASTROINTESTINAL: No anorexia, nausea, vomiting or diarrhea. No abdominal pain or blood.  GENITOURINARY: No burning on urination, no polyuria NEUROLOGICAL: No headache, dizziness, syncope, paralysis, ataxia, numbness or tingling in the extremities. No change in bowel or bladder control.  MUSCULOSKELETAL: No muscle, back pain, joint pain or stiffness.  LYMPHATICS: No enlarged nodes. No history of splenectomy.  PSYCHIATRIC: No history of depression or anxiety.  ENDOCRINOLOGIC: No reports of sweating, cold or heat intolerance. No polyuria or polydipsia.  Marland Kitchen   Physical Examination Today's Vitals   10/25/20 1357  BP: 110/70  Pulse: 90  SpO2: 98%  Weight: 159 lb (72.1 kg)  Height: 5\' 6"  (1.676 m)   Body mass index is 25.66 kg/m.  Gen: resting comfortably, no acute distress HEENT: no scleral icterus, pupils equal round and reactive, no palptable cervical adenopathy,  CV: RRR, no m/r/g, no jvd Resp: Clear to  auscultation bilaterally GI: abdomen is soft, non-tender, non-distended, normal bowel sounds, no hepatosplenomegaly MSK: extremities are warm, no edema.  Skin: warm, no rash Neuro:  no focal deficits Psych: appropriate affect   Diagnostic Studies 06/2016 event monitor  Telemetry tracings show sinus rhythm and sinus tachycardia  No symptoms reported  No significant arrhythmias  09/2018 echo Study Conclusions   - Left ventricle: The cavity size was normal. Wall thickness was  normal. Systolic function was normal. The estimated ejection  fraction was in the range of 60% to 65%. Wall motion was normal;  there were no regional wall motion abnormalities. Left  ventricular diastolic function parameters were normal.  - Aortic valve: Valve area (VTI): 2.13 cm^2. Valve area (Vmax):  2.47 cm^2. Valve area (  Vmean): 2.42 cm^2.  - Technically adequate study.  Assessment and Plan  1. Chest pain - long history of chest pain symptoms, prior echo was benign - recent symptoms different from prior. Young age without any CAD risk factors - symptoms improve with aspirin suggesting an inflammatory process, will try 7 day course of ibuprofen 400mg  bid - if ongoing or progressing symptoms could consider a simple GXT.        , M.D.

## 2020-10-25 NOTE — Patient Instructions (Signed)
Medication Instructions:  Your physician has recommended you make the following change in your medication:   Take over the counter Ibuprofen 400 mg Two Times Daily For Seven Days   *If you need a refill on your cardiac medications before your next appointment, please call your pharmacy*   Lab Work: NONE   If you have labs (blood work) drawn today and your tests are completely normal, you will receive your results only by: Marland Kitchen MyChart Message (if you have MyChart) OR . A paper copy in the mail If you have any lab test that is abnormal or we need to change your treatment, we will call you to review the results.   Testing/Procedures: NONE    Follow-Up: At [facility], you and your health needs are our priority.  As part of our continuing mission to provide you with exceptional heart care, we have created designated Provider Care Teams.  These Care Teams include your primary Cardiologist (physician) and Advanced Practice Providers (APPs -  Physician Assistants and Nurse Practitioners) who all work together to provide you with the care you need, when you need it.  We recommend signing up for the patient portal called "MyChart".  Sign up information is provided on this After Visit Summary.  MyChart is used to connect with patients for Virtual Visits (Telemedicine).  Patients are able to view lab/test results, encounter notes, upcoming appointments, etc.  Non-urgent messages can be sent to your provider as well.   To learn more about what you can do with MyChart, go to [email or web address].    Your next appointment:   1 month(s)  The format for your next appointment:   In Person  Provider:   You will see one of the following Advanced Practice Providers on your designated Care Team:    Randall An, PA-C   Jacolyn Reedy, PA-C       Other Instructions Thank you for choosing [facility]!

## 2020-10-26 IMAGING — US US ABDOMEN COMPLETE
1 series · 14 of 25 positions shown · non-contrast
Comparison: None.

CLINICAL DATA: 24-year-old female with hyperbilirubinemia.

EXAM:
ABDOMEN ULTRASOUND COMPLETE

[Series 1: us abdomen complete · 0.18mm/px · 14 of 182 slices shown]
[im 1/182]
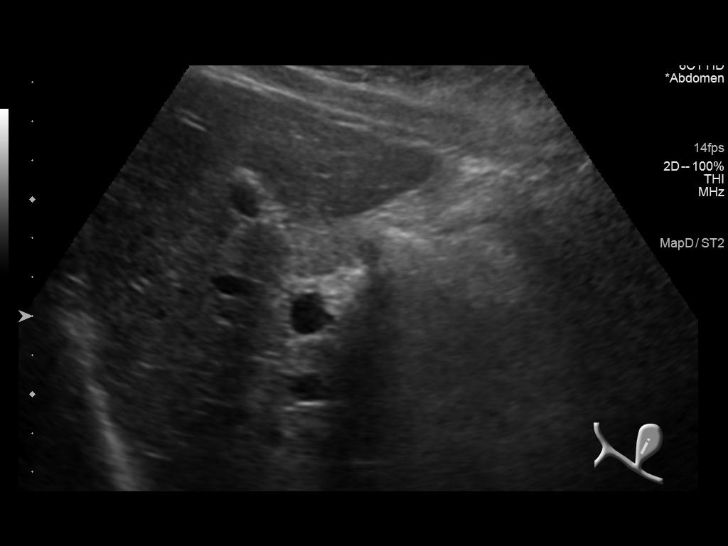
[im 16/182]
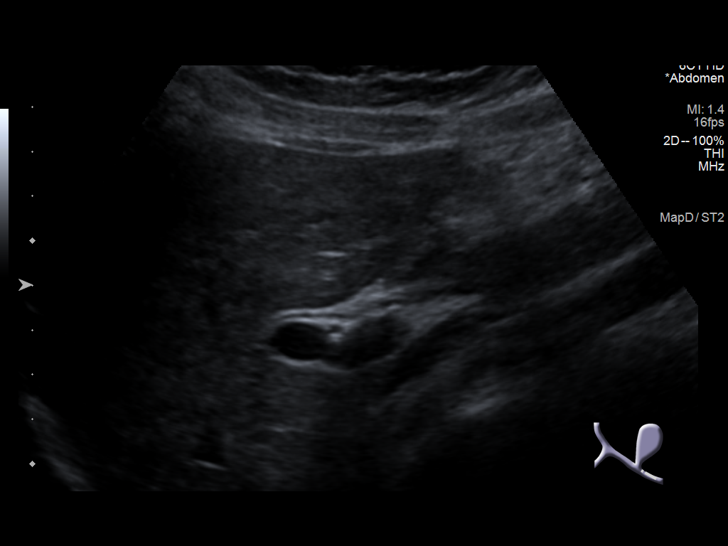
[im 31/182]
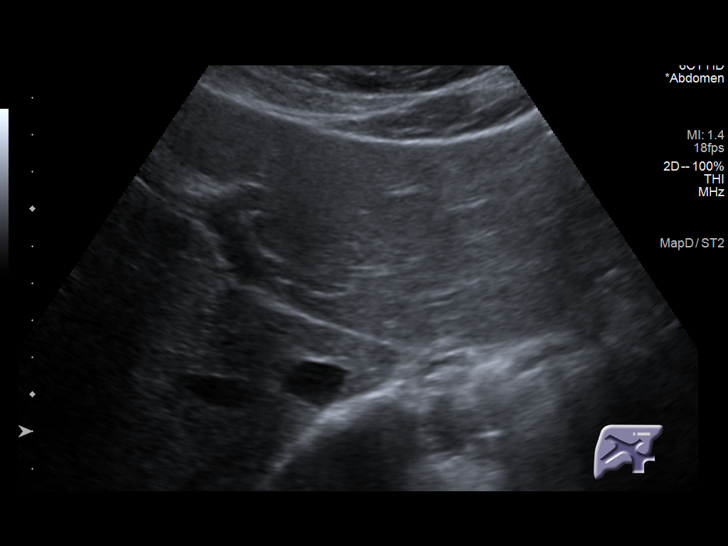
[im 46/182]
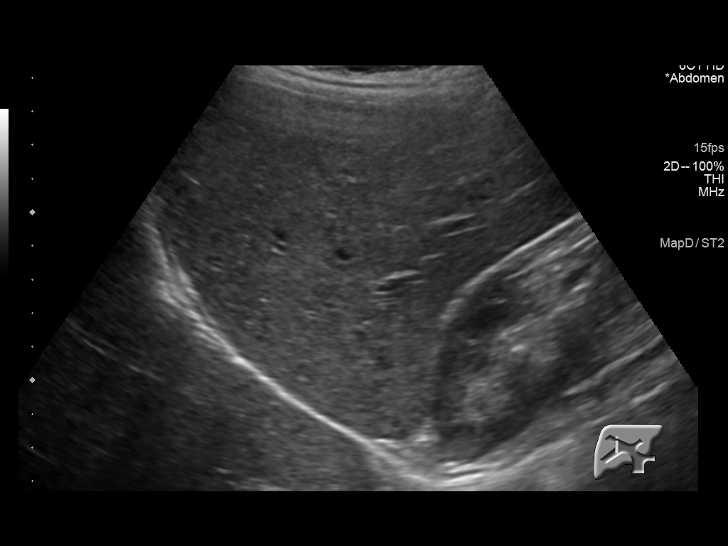
[im 61/182]
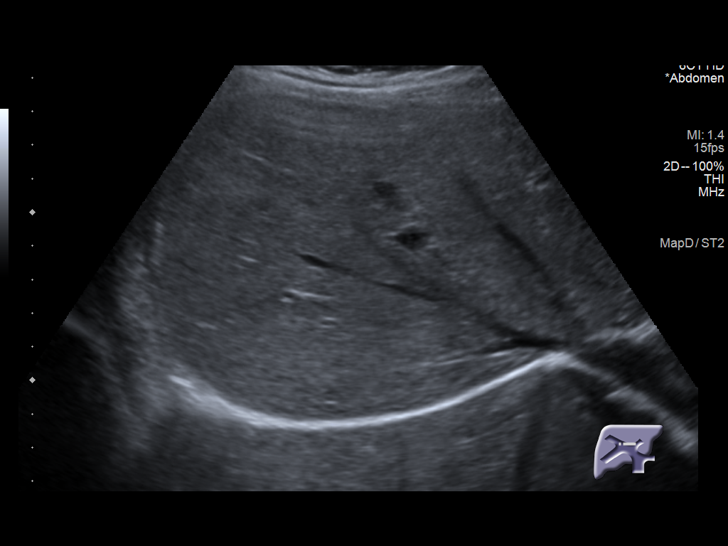
[im 68/182]
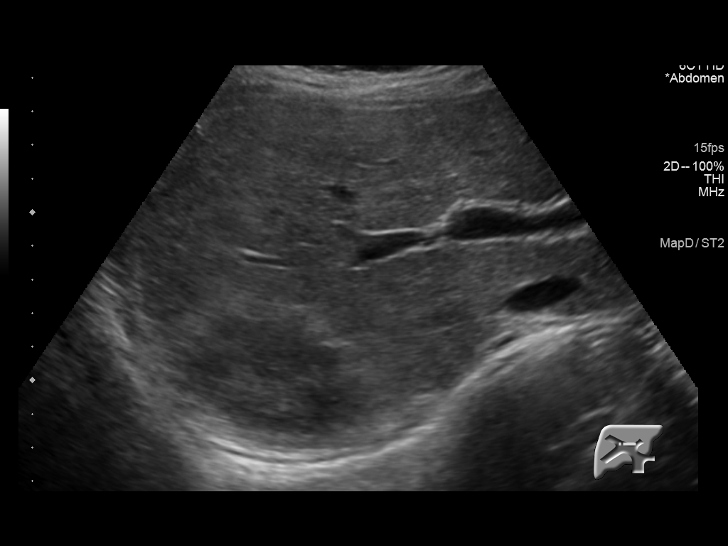
[im 83/182]
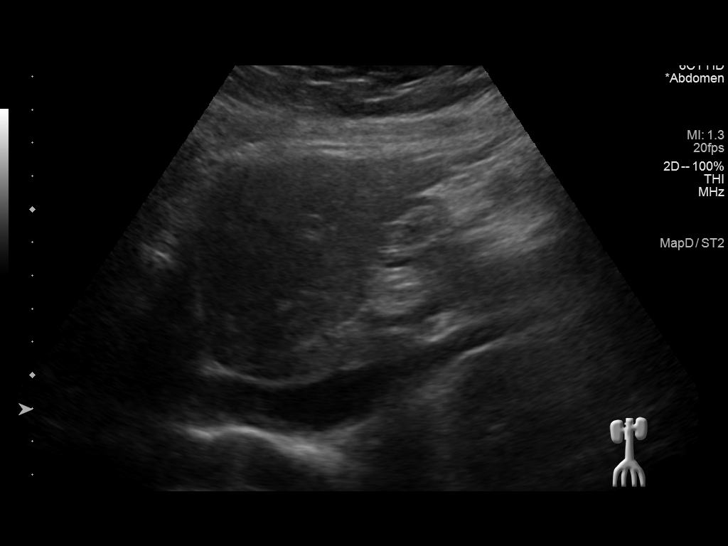
[im 99/182]
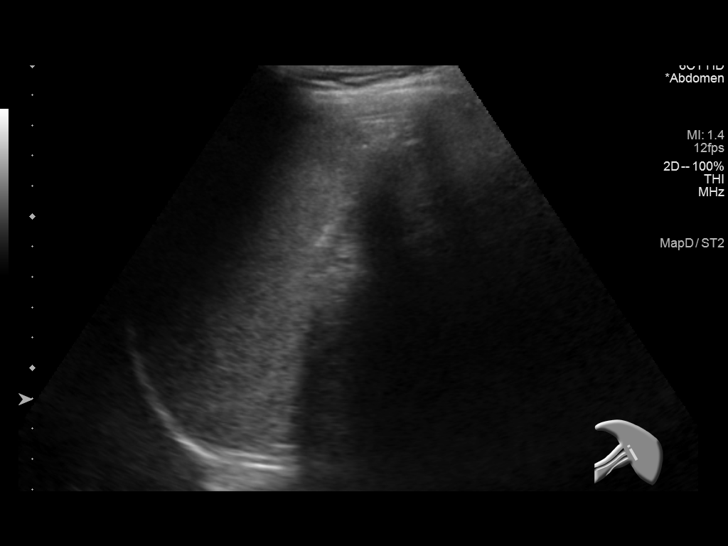
[im 114/182]
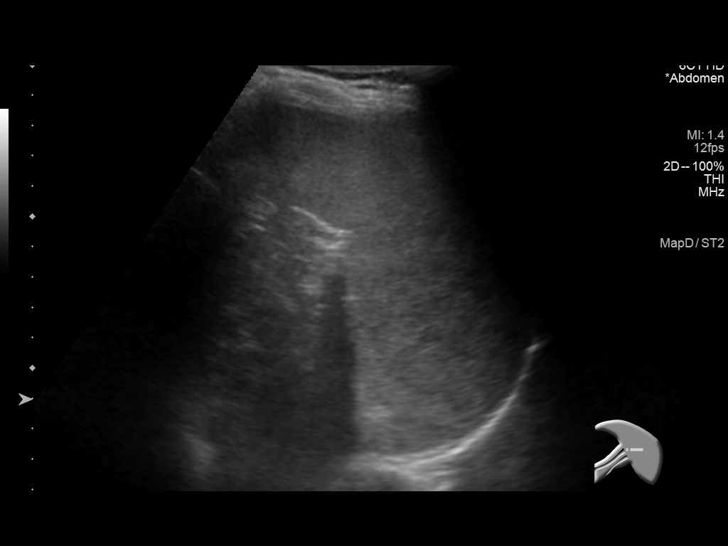
[im 121/182]
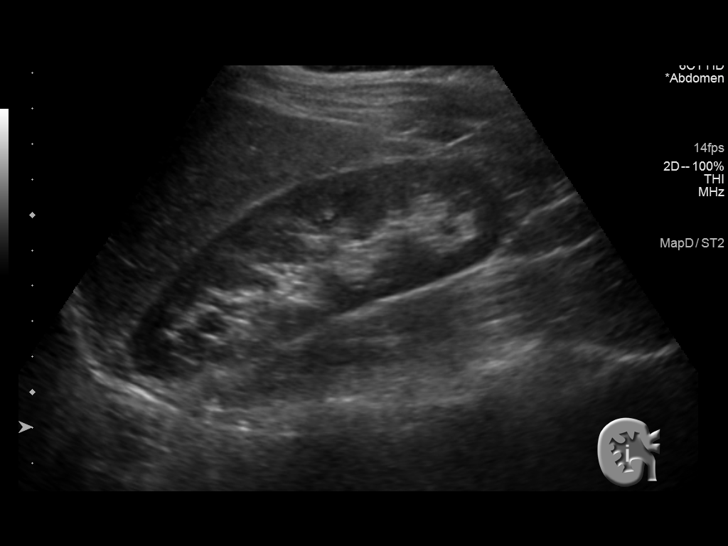
[im 136/182]
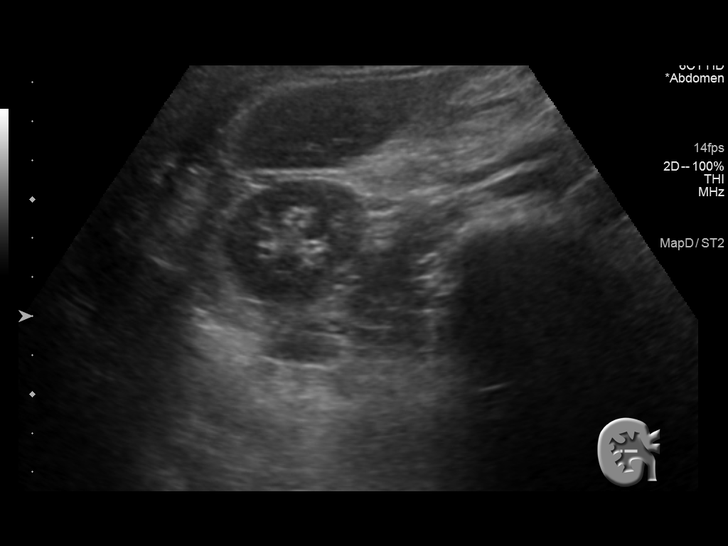
[im 151/182]
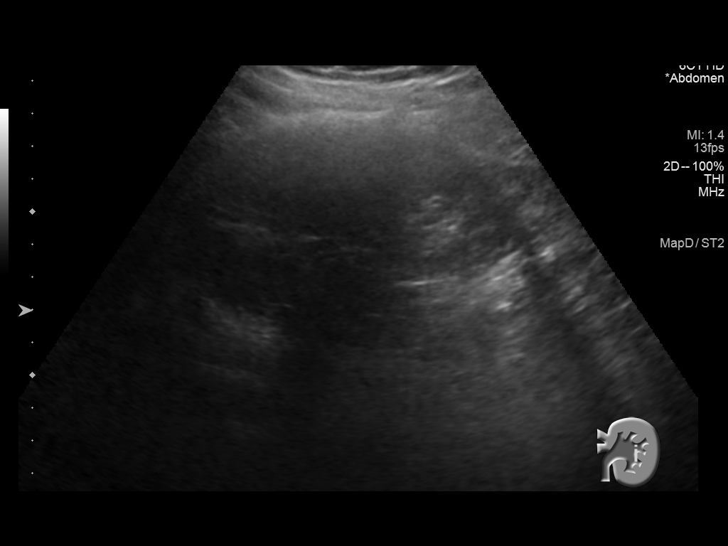
[im 166/182]
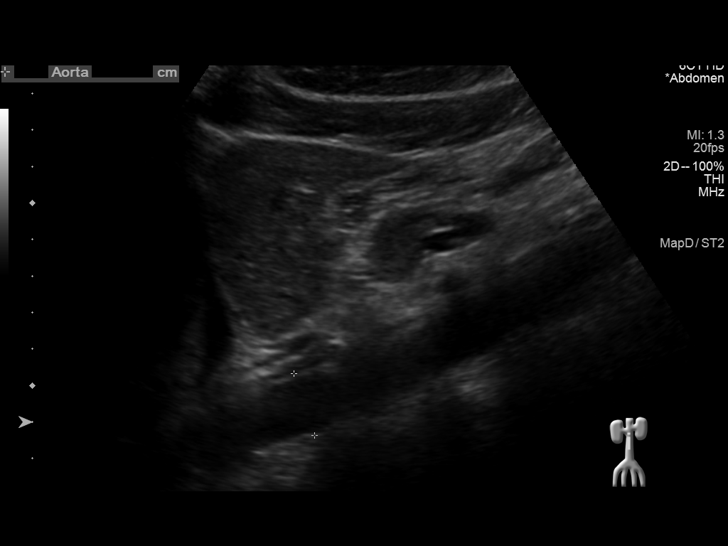
[im 182/182]
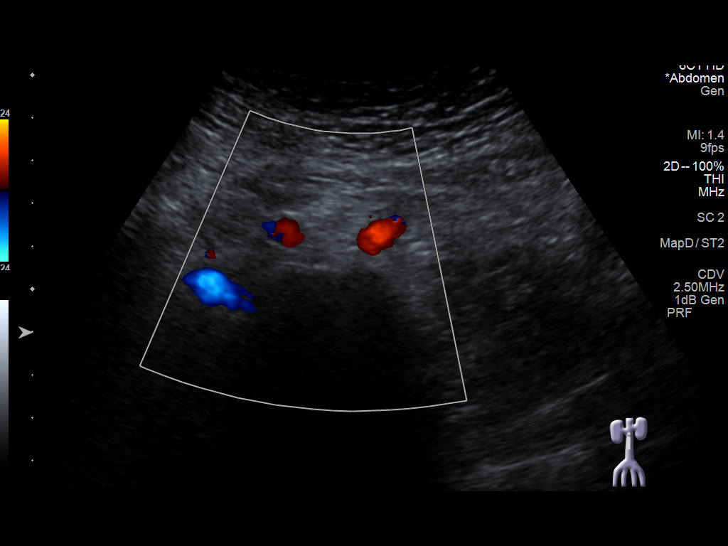

[14 of 25 positions shown; findings below may reference images not displayed]

FINDINGS: Gallbladder: Cholecystectomy.

Common bile duct: Diameter: 1 mm

Liver: No focal lesion identified. Within normal limits in
parenchymal echogenicity. Portal vein is patent on color Doppler
imaging with normal direction of blood flow towards the liver.

IVC: No abnormality visualized.

Pancreas: Visualized portion unremarkable.

Spleen: Size and appearance within normal limits.

Right Kidney: Length: 11.5 cm. Echogenicity within normal limits. No
mass or hydronephrosis visualized.

Left Kidney: Length: 11.4 cm. Echogenicity within normal limits. No
mass or hydronephrosis visualized.

Abdominal aorta: No aneurysm visualized.

Other findings: None.
IMPRESSION: Cholecystectomy, otherwise unremarkable abdominal ultrasound.

## 2020-11-30 ENCOUNTER — Ambulatory Visit: Payer: Commercial Managed Care - PPO | Admitting: General Practice

## 2021-01-17 ENCOUNTER — Inpatient Hospital Stay (HOSPITAL_BASED_OUTPATIENT_CLINIC_OR_DEPARTMENT_OTHER): Payer: Commercial Managed Care - PPO | Admitting: Oncology

## 2021-01-17 ENCOUNTER — Other Ambulatory Visit: Payer: Self-pay

## 2021-01-17 ENCOUNTER — Other Ambulatory Visit (HOSPITAL_COMMUNITY): Payer: Commercial Managed Care - PPO

## 2021-01-17 ENCOUNTER — Inpatient Hospital Stay (HOSPITAL_COMMUNITY): Payer: Commercial Managed Care - PPO | Attending: Hematology

## 2021-01-17 ENCOUNTER — Telehealth (HOSPITAL_COMMUNITY): Payer: Commercial Managed Care - PPO | Admitting: Oncology

## 2021-01-17 DIAGNOSIS — G629 Polyneuropathy, unspecified: Secondary | ICD-10-CM | POA: Diagnosis not present

## 2021-01-17 DIAGNOSIS — Z79899 Other long term (current) drug therapy: Secondary | ICD-10-CM | POA: Insufficient documentation

## 2021-01-17 DIAGNOSIS — R002 Palpitations: Secondary | ICD-10-CM | POA: Insufficient documentation

## 2021-01-17 DIAGNOSIS — Z9049 Acquired absence of other specified parts of digestive tract: Secondary | ICD-10-CM | POA: Diagnosis not present

## 2021-01-17 DIAGNOSIS — Z808 Family history of malignant neoplasm of other organs or systems: Secondary | ICD-10-CM | POA: Insufficient documentation

## 2021-01-17 DIAGNOSIS — Z8249 Family history of ischemic heart disease and other diseases of the circulatory system: Secondary | ICD-10-CM | POA: Insufficient documentation

## 2021-01-17 DIAGNOSIS — R5383 Other fatigue: Secondary | ICD-10-CM | POA: Insufficient documentation

## 2021-01-17 DIAGNOSIS — Z98891 History of uterine scar from previous surgery: Secondary | ICD-10-CM | POA: Insufficient documentation

## 2021-01-17 DIAGNOSIS — E538 Deficiency of other specified B group vitamins: Secondary | ICD-10-CM | POA: Diagnosis not present

## 2021-01-17 DIAGNOSIS — D508 Other iron deficiency anemias: Secondary | ICD-10-CM | POA: Insufficient documentation

## 2021-01-17 LAB — COMPREHENSIVE METABOLIC PANEL
ALT: 18 U/L (ref 0–44)
AST: 17 U/L (ref 15–41)
Albumin: 4.3 g/dL (ref 3.5–5.0)
Alkaline Phosphatase: 45 U/L (ref 38–126)
Anion gap: 5 (ref 5–15)
BUN: 11 mg/dL (ref 6–20)
CO2: 24 mmol/L (ref 22–32)
Calcium: 9 mg/dL (ref 8.9–10.3)
Chloride: 109 mmol/L (ref 98–111)
Creatinine, Ser: 0.65 mg/dL (ref 0.44–1.00)
GFR, Estimated: >60 mL/min (ref >60)
Glucose, Bld: 109 mg/dL — ABNORMAL HIGH (ref 70–99)
Potassium: 4.1 mmol/L (ref 3.5–5.1)
Sodium: 138 mmol/L (ref 135–145)
Total Bilirubin: 1.2 mg/dL (ref 0.3–1.2)
Total Protein: 7.3 g/dL (ref 6.5–8.1)

## 2021-01-17 LAB — CBC WITH DIFFERENTIAL/PLATELET
Abs Immature Granulocytes: 0.02 10*3/uL (ref 0.00–0.07)
Basophils Absolute: 0 10*3/uL (ref 0.0–0.1)
Basophils Relative: 0 %
Eosinophils Absolute: 0.1 10*3/uL (ref 0.0–0.5)
Eosinophils Relative: 1 %
HCT: 40.7 % (ref 36.0–46.0)
Hemoglobin: 13.4 g/dL (ref 12.0–15.0)
Immature Granulocytes: 0 %
Lymphocytes Relative: 22 %
Lymphs Abs: 1.6 10*3/uL (ref 0.7–4.0)
MCH: 29.1 pg (ref 26.0–34.0)
MCHC: 32.9 g/dL (ref 30.0–36.0)
MCV: 88.5 fL (ref 80.0–100.0)
Monocytes Absolute: 0.4 10*3/uL (ref 0.1–1.0)
Monocytes Relative: 6 %
Neutro Abs: 4.9 10*3/uL (ref 1.7–7.7)
Neutrophils Relative %: 71 %
Platelets: 206 10*3/uL (ref 150–400)
RBC: 4.6 MIL/uL (ref 3.87–5.11)
RDW: 12.8 % (ref 11.5–15.5)
WBC: 7.1 10*3/uL (ref 4.0–10.5)
nRBC: 0 % (ref 0.0–0.2)

## 2021-01-17 LAB — VITAMIN B12: Vitamin B-12: 384 pg/mL (ref 180–914)

## 2021-01-17 LAB — FOLATE: Folate: 9 ng/mL (ref >5.9)

## 2021-01-17 LAB — VITAMIN D 25 HYDROXY (VIT D DEFICIENCY, FRACTURES): Vit D, 25-Hydroxy: 29.55 ng/mL — ABNORMAL LOW (ref 30–100)

## 2021-01-17 LAB — LACTATE DEHYDROGENASE: LDH: 135 U/L (ref 98–192)

## 2021-01-17 LAB — IRON AND TIBC
Iron: 89 ug/dL (ref 28–170)
Saturation Ratios: 29 % (ref 10.4–31.8)
TIBC: 304 ug/dL (ref 250–450)
UIBC: 215 ug/dL

## 2021-01-17 LAB — FERRITIN: Ferritin: 72 ng/mL (ref 11–307)

## 2021-01-17 NOTE — Progress Notes (Signed)
[facility] Cancer Follow up:    Carol Bernard, Carol Bernard  I connected with Carol Bernard on 01/17/21 at  9:30 AM EST by telephone visit and verified that I am speaking with the correct person using two identifiers.   I discussed the limitations, risks, security and privacy concerns of performing an evaluation and management service by telemedicine and the availability of in-person appointments. I also discussed with the patient that there may be a patient responsible charge related to this service. The patient expressed understanding and agreed to proceed.   Other persons participating in the visit and their role in the encounter: None  Patient's location: Home Provider's location: Home   DIAGNOSIS: Iron deficiency anemia  CURRENT THERAPY: Intermittent iron infusions  INTERVAL HISTORY: Carol Bernard 25 y.o. female was called for a telephone visit for iron deficiency anemia.  She was last seen in clinic on 07/19/2020 for follow-up.  She last received IV Venofer from 06/30/19-08/05/19 for total of 4 doses.  She noted improvement of her fatigue and energy levels.  In the interim, she has done well.  She was evaluated by cardiology on 10/25/2020 for palpitations that have been present for about a year.  Echo was benign.  Symptoms improved with aspirin suggesting inflammatory process.  Darted on 7-day course of ibuprofen 400 mg twice daily.  Today, she reports feeling well but has had increasing fatigue and feels that she "wears out" quicker than usual.  She has 58-year-old twin daughters. 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  She has restarted taking gabapentin for bilateral lower extremity neuropathy.  She is currently taking 300 mg twice a day. She denies any bright red bleeding per rectum or melena.  She denies any easy bruising or bleeding. Denies any nausea, vomiting, or diarrhea. Denies any new pains. Had not noticed any  recent bleeding such as epistaxis, hematuria or hematochezia. Denies recent chest pain on exertion, shortness of breath on minimal exertion, pre-syncopal episodes, or palpitations. Denies any numbness or tingling in hands or feet. Denies any recent fevers, infections, or recent hospitalizations. Patient reports appetite at 75% and energy level at 75%.  She is eating well maintain her weight at this time.    Patient Active Problem List   Diagnosis Date Noted  . Neuropathy 07/11/2020  . Encounter for IUD insertion 06/13/2020  . Iron deficiency anemia 06/25/2019  . Lupus anticoagulant disorder (HCC) 04/28/2018  . Previous cesarean section 04/28/2018  . H/O Gestational thrombocytopenia (HCC) 08/17/2017  . 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  MEDICAL HISTORY: Past Medical History:  Diagnosis Date  . Allergy   . Anxiety and depression   . Asthma   . Birth defect   . Blood transfusion without reported diagnosis   . Clotting disorder (HCC)   . Complication of anesthesia    'I had stridor after my 1st D&C but with my 2nd one they gave me a breathing treatment before my surgery and I did ok".  . Cyst of left kidney    frequent cysts to kidneys  . Dysrhythmia    h/o SVT  . Family history of adverse reaction to anesthesia   . GERD (gastroesophageal reflux disease)   . Headache   . History of recurrent miscarriages   . Hypoglycemia   . Lupus anticoagulant  positive   . Neuropathic pain   . Palpitations     SURGICAL HISTORY: Past Surgical History:  Procedure Laterality Date  . CESAREAN SECTION    . CHOLECYSTECTOMY N/A 07/23/2018   Procedure: LAPAROSCOPIC CHOLECYSTECTOMY;  Surgeon: Lucretia Roers, MD;  Location: [facility];  Service: General;  Laterality: N/A;  . DILATION AND CURETTAGE OF UTERUS N/A 01/29/2017   Procedure: SUCTION DILATATION AND CURETTAGE;  Surgeon: Tilda Burrow, MD;  Location: [facility];  Service: Gynecology;  Laterality: N/A;  . DILATION AND CURETTAGE OF UTERUS N/A 05/01/2017   Procedure: SUCTION DILATATION AND CURETTAGE;  Surgeon: Lazaro Arms, MD;  Location: [facility];  Service: Gynecology;  Laterality: N/A;  pt to arrive at 7:15 to have labwork  . FETOSCOPIC LASER PHOTOCOAGULATION  11/14/2017   of 34 placental vessels for TTTS  . WISDOM TOOTH EXTRACTION      SOCIAL HISTORY: Social History   Socioeconomic History  . Marital status: Single    Spouse name: Not on file  . Number of children: Not on file  . Years of education: Not on file  . Highest education level: Not on file  Occupational History  . Occupation: Loss adjuster, chartered  . Occupation: Psychologist, sport and exercise  Tobacco Use  . Smoking status: Never Smoker  . Smokeless tobacco: Never Used  Vaping Use  . Vaping Use: Never used  Substance and Sexual Activity  . Alcohol use: No    Alcohol/week: 0.0 standard drinks  . Drug use: No  . Sexual activity: Yes    Birth control/protection: Injection  Other Topics Concern  . Not on file  Social History Narrative  . Not on file   Social Determinants of Health   Financial Resource Strain: Not on file  Food Insecurity: Not on file  Transportation Needs: Not on file  Physical Activity: Not on file  Stress: Not on file  Social Connections: Not on file  Intimate Partner Violence: Not on file    FAMILY HISTORY: Family History  Adopted: Yes  Problem Relation Age of Onset  . Migraines Sister   . Hypertension Maternal Grandmother   . Skin cancer Maternal Grandmother   . Heart disease Maternal Grandfather     Review of Systems  All other systems reviewed and are negative.   Vital signs: -Deferred due to telephone visit  Physical Exam -Deferred due to telephone visit -Patient was alert and oriented over the phone and in no acute distress next  LABORATORY DATA:  CBC    Component Value Date/Time   WBC 7.1 01/17/2021 0803   RBC  4.60 01/17/2021 0803   HGB 13.4 01/17/2021 0803   HGB 12.1 04/28/2018 1611   HCT 40.7 01/17/2021 0803   HCT 35.5 04/28/2018 1611   PLT 206 01/17/2021 0803   PLT 187 04/28/2018 1611   MCV 88.5 01/17/2021 0803   MCV 83 04/28/2018 1611   MCH 29.1 01/17/2021 0803   MCHC 32.9 01/17/2021 0803   RDW 12.8 01/17/2021 0803   RDW 14.0 04/28/2018 1611   LYMPHSABS 1.6 01/17/2021 0803   LYMPHSABS 1.4 04/28/2018 1611   MONOABS 0.4 01/17/2021 0803   EOSABS 0.1 01/17/2021 0803   EOSABS 0.1 04/28/2018 1611   BASOSABS 0.0 01/17/2021 0803   BASOSABS 0.0 04/28/2018 1611    CMP     Component Value Date/Time   NA 138 01/17/2021 0803   NA 136 12/12/2017 1542   K 4.1 01/17/2021 0803   CL 109 01/17/2021 0803   CO2 24  01/17/2021 0803   GLUCOSE 109 (H) 01/17/2021 0803   BUN 11 01/17/2021 0803   BUN 6 12/12/2017 1542   CREATININE 0.65 01/17/2021 0803   CALCIUM 9.0 01/17/2021 0803   PROT 7.3 01/17/2021 0803   PROT 6.3 12/12/2017 1542   ALBUMIN 4.3 01/17/2021 0803   ALBUMIN 3.7 12/12/2017 1542   AST 17 01/17/2021 0803   ALT 18 01/17/2021 0803   ALKPHOS 45 01/17/2021 0803   BILITOT 1.2 01/17/2021 0803   BILITOT 0.9 12/12/2017 1542   GFRNONAA >60 01/17/2021 0803   GFRAA >60 07/15/2020 6761    All questions were answered to patient's stated satisfaction. Encouraged patient to call with any new concerns or questions before his next visit to the cancer center and we can certain see him sooner, if needed.     ASSESSMENT and THERAPY PLAN:  1.  Iron deficiency anemia: -Last menstrual period was 2 to 3 months ago.  She has been on depo-Provera shots for birth control. -She last received 4 infusions of Venofer with the last one being on 08/05/2019. -Labs on 01/17/2021 show hemoglobin of 13.4, ferritin 72, percent saturation 29%. -Lab work appears essentially stable from previous.  Slight decline in ferritin levels. -Patient would like to try 2 doses of IV Venofer to see if her symptoms improve.  2.   Indirect hyperbilirubinemia: -She was found to have elevated total bilirubin on 04/01/2019 at 2.7. -LDH was mildly elevated.  It was predominantly indirect bilirubin.  Direct Coombs test was negative for IgG and complement. -DAT negative hemolytic anemia evaluation [place] labs were also negative.  -Differential diagnosis includes Gilbert's syndrome. -She has been referred to GI. -GI did upper endoscopy on 01/15/2020 with biopsies.  Did showed mild chronic gastritis.  No other pathological findings.  No source of bleeding. -Labs from 01/17/2021 show a total bilirubin of 1.2. - Continue follow-up with GI  3.  Vitamin D deficiency: -Labs done on 01/17/21 are pending -Patient reports she takes vitamin D once daily. -We will change this to twice daily.  4. Bilateral lower extremity:  -Secondary to pregnancy with her twins -Currently taking gabapentin 300 mg twice daily with improvement of her symptoms -Stable -Monitored by her PCP  Disposition: - IV Venofer x2 doses -RTC in 6 months with labs and MD assessment.  No problem-specific Assessment & Plan notes found for this encounter.  No orders of the defined types were placed in this encounter.  All questions were answered. The patient knows to call the clinic with any problems, questions or concerns. We can certainly see the patient much sooner if necessary. This note was electronically signed.  I provided 20 minutes of non face-to-face telephone visit time during this encounter, and > 50% was spent counseling as documented under my assessment & plan.  Mauro Kaufmann, NP 01/17/2021

## 2021-01-19 ENCOUNTER — Telehealth (HOSPITAL_COMMUNITY): Payer: Commercial Managed Care - PPO | Admitting: Oncology

## 2021-01-20 ENCOUNTER — Telehealth (HOSPITAL_COMMUNITY): Payer: Commercial Managed Care - PPO | Admitting: Nurse Practitioner

## 2021-01-20 ENCOUNTER — Other Ambulatory Visit (HOSPITAL_COMMUNITY): Payer: Commercial Managed Care - PPO

## 2021-02-03 ENCOUNTER — Other Ambulatory Visit: Payer: Self-pay

## 2021-02-03 ENCOUNTER — Encounter (HOSPITAL_COMMUNITY): Payer: Self-pay

## 2021-02-03 ENCOUNTER — Inpatient Hospital Stay (HOSPITAL_COMMUNITY): Payer: Commercial Managed Care - PPO | Attending: Hematology

## 2021-02-03 VITALS — BP 103/58 | HR 68 | Temp 97.1°F | Resp 18

## 2021-02-03 DIAGNOSIS — Z79899 Other long term (current) drug therapy: Secondary | ICD-10-CM | POA: Diagnosis not present

## 2021-02-03 DIAGNOSIS — R5383 Other fatigue: Secondary | ICD-10-CM | POA: Insufficient documentation

## 2021-02-03 DIAGNOSIS — Z8249 Family history of ischemic heart disease and other diseases of the circulatory system: Secondary | ICD-10-CM | POA: Insufficient documentation

## 2021-02-03 DIAGNOSIS — Z808 Family history of malignant neoplasm of other organs or systems: Secondary | ICD-10-CM | POA: Insufficient documentation

## 2021-02-03 DIAGNOSIS — D508 Other iron deficiency anemias: Secondary | ICD-10-CM

## 2021-02-03 DIAGNOSIS — D509 Iron deficiency anemia, unspecified: Secondary | ICD-10-CM | POA: Diagnosis present

## 2021-02-03 MED ORDER — SODIUM CHLORIDE 0.9 % IV SOLN
200.0000 mg | Freq: Once | INTRAVENOUS | Status: AC
  Administered 2021-02-03: 200 mg via INTRAVENOUS
  Filled 2021-02-03: qty 200

## 2021-02-03 MED ORDER — SODIUM CHLORIDE 0.9 % IV SOLN: Freq: Once | INTRAVENOUS | Status: AC

## 2021-02-03 NOTE — Progress Notes (Signed)
Patient tolerated iron infusion with no complaints voiced.  Peripheral IV site clean and dry with good blood return noted before and after infusion.  Band aid applied.  VSS with discharge and left in satisfactory condition with no s/s of distress noted.   

## 2021-02-09 ENCOUNTER — Encounter (HOSPITAL_COMMUNITY): Payer: Self-pay

## 2021-02-09 ENCOUNTER — Inpatient Hospital Stay (HOSPITAL_COMMUNITY): Payer: Commercial Managed Care - PPO

## 2021-02-09 ENCOUNTER — Other Ambulatory Visit: Payer: Self-pay

## 2021-02-09 VITALS — BP 116/60 | HR 82 | Temp 97.5°F | Resp 18

## 2021-02-09 DIAGNOSIS — D509 Iron deficiency anemia, unspecified: Secondary | ICD-10-CM | POA: Diagnosis not present

## 2021-02-09 DIAGNOSIS — D508 Other iron deficiency anemias: Secondary | ICD-10-CM

## 2021-02-09 MED ORDER — SODIUM CHLORIDE 0.9 % IV SOLN: Freq: Once | INTRAVENOUS | Status: AC

## 2021-02-09 MED ORDER — SODIUM CHLORIDE 0.9 % IV SOLN
200.0000 mg | Freq: Once | INTRAVENOUS | Status: AC
  Administered 2021-02-09: 200 mg via INTRAVENOUS
  Filled 2021-02-09: qty 200

## 2021-02-09 NOTE — Progress Notes (Signed)
Infusion tolerated without incident or complaint. VSS upon completion of treatment. Port flushed and deaccessed per protocol, see MAR and IV flowsheet for details. Discharged ambulatory in satisfactory condition with follow up instructions. 

## 2021-04-04 ENCOUNTER — Encounter (HOSPITAL_COMMUNITY): Payer: Self-pay | Admitting: Emergency Medicine

## 2021-04-04 ENCOUNTER — Emergency Department (HOSPITAL_COMMUNITY)
Admission: EM | Admit: 2021-04-04 | Discharge: 2021-04-04 | Disposition: A | Discharge status: Home / Self Care | Payer: Commercial Managed Care - PPO | Attending: Emergency Medicine | Admitting: Emergency Medicine

## 2021-04-04 ENCOUNTER — Other Ambulatory Visit: Payer: Self-pay

## 2021-04-04 DIAGNOSIS — H66001 Acute suppurative otitis media without spontaneous rupture of ear drum, right ear: Secondary | ICD-10-CM

## 2021-04-04 DIAGNOSIS — J45909 Unspecified asthma, uncomplicated: Secondary | ICD-10-CM | POA: Insufficient documentation

## 2021-04-04 DIAGNOSIS — H9201 Otalgia, right ear: Secondary | ICD-10-CM | POA: Diagnosis not present

## 2021-04-04 DIAGNOSIS — H60501 Unspecified acute noninfective otitis externa, right ear: Secondary | ICD-10-CM

## 2021-04-04 MED ORDER — NEOMYCIN-POLYMYXIN-HC 1 % OT SOLN
4.0000 [drp] | Freq: Once | OTIC | Status: AC
  Administered 2021-04-04: 4 [drp] via OTIC
  Filled 2021-04-04: qty 10

## 2021-04-04 MED ORDER — AMOXICILLIN-POT CLAVULANATE 500-125 MG PO TABS
1.0000 | ORAL_TABLET | Freq: Once | ORAL | Status: AC
  Administered 2021-04-04: 500 mg via ORAL
  Filled 2021-04-04: qty 1

## 2021-04-04 MED ORDER — ONDANSETRON 8 MG PO TBDP
8.0000 mg | ORAL_TABLET | Freq: Once | ORAL | Status: AC
  Administered 2021-04-04: 8 mg via ORAL
  Filled 2021-04-04: qty 1

## 2021-04-04 MED ORDER — AMOXICILLIN-POT CLAVULANATE 500-125 MG PO TABS: 1.0000 | ORAL_TABLET | Freq: Three times a day (TID) | ORAL | 0 refills | Status: DC

## 2021-04-04 MED ORDER — ONDANSETRON 8 MG PO TBDP: ORAL_TABLET | ORAL | 0 refills | Status: DC

## 2021-04-04 NOTE — Discharge Instructions (Signed)
Begin taking Augmentin as prescribed.  Use Cortisporin drops, 4 drops every 4 hours while awake for the next 5 days.  Take ibuprofen 600 mg every 6 hours as needed for pain.  Follow-up with ENT if not improving in the next 3 days.  The contact information for [facility]NT has been provided in this discharge summary for you to call and make these arrangements.

## 2021-04-04 NOTE — ED Notes (Signed)
Pt provided with crackers and ginger ale d/t increase in nausea. Pt tolerating PO at this time. Will continue to monitor.

## 2021-04-04 NOTE — ED Triage Notes (Signed)
Pt c/o right ear pain for a few days.

## 2021-04-04 NOTE — ED Provider Notes (Signed)
[facility] EMERGENCY DEPARTMENT Provider Note   CSN: [id number] Arrival date & time: 04/04/21  0132     History Chief Complaint  Patient presents with  . Ear Pain    Carol Bernard is a 26 y.o. female.  Patient is a 26 year old female presenting with complaints of right ear pain.  This began 3 days ago in the absence of any injury or trauma.  She tried taking amoxicillin which she had leftover from a previous prescription, however this is not helping.  She does describe some muffled hearing.  She denies any bleeding or drainage from the ear.  The history is provided by the patient.       Past Medical History:  Diagnosis Date  . Allergy   . Anxiety and depression   . Asthma   . Birth defect   . Blood transfusion without reported diagnosis   . Clotting disorder (HCC)   . Complication of anesthesia    'I had stridor after my 1st D&C but with my 2nd one they gave me a breathing treatment before my surgery and I did ok".  . Cyst of left kidney    frequent cysts to kidneys  . Dysrhythmia    h/o SVT  . Family history of adverse reaction to anesthesia   . GERD (gastroesophageal reflux disease)   . Headache   . History of recurrent miscarriages   . Hypoglycemia   . Lupus anticoagulant positive   . Neuropathic pain   . Palpitations     Patient Active Problem List   Diagnosis Date Noted  . Neuropathy 07/11/2020  . Encounter for IUD insertion 06/13/2020  . Iron deficiency anemia 06/25/2019  . Lupus anticoagulant disorder (HCC) 04/28/2018  . Previous cesarean section 04/28/2018  . H/O Gestational thrombocytopenia (HCC) 08/17/2017  . 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 CESAREAN SECTION    . CHOLECYSTECTOMY N/A 07/23/2018   Procedure: LAPAROSCOPIC CHOLECYSTECTOMY;  Surgeon: Lucretia Roers, MD;  Location: [facility];  Service: General;  Laterality: N/A;  . DILATION AND CURETTAGE OF UTERUS N/A 01/29/2017    Procedure: SUCTION DILATATION AND CURETTAGE;  Surgeon: Tilda Burrow, MD;  Location: [facility];  Service: Gynecology;  Laterality: N/A;  . DILATION AND CURETTAGE OF UTERUS N/A 05/01/2017   Procedure: SUCTION DILATATION AND CURETTAGE;  Surgeon: Lazaro Arms, MD;  Location: [facility];  Service: Gynecology;  Laterality: N/A;  pt to arrive at 7:15 to have labwork  . FETOSCOPIC LASER PHOTOCOAGULATION  11/14/2017   of 34 placental vessels for TTTS  . WISDOM TOOTH EXTRACTION       OB History    Gravida  7   Para  1   Term      Preterm  1   AB  5   Living  2     SAB  4   IAB      Ectopic      Multiple  1   Live Births  2           Family History  Adopted: Yes  Problem Relation Age of Onset  . Migraines Sister   . Hypertension Maternal Grandmother   . Skin cancer Maternal Grandmother   . Heart disease Maternal Grandfather     Social History   Tobacco Use  . Smoking status: Never Smoker  . Smokeless tobacco: Never Used  Vaping Use  . Vaping Use: Never used  Substance Use  Topics  . Alcohol use: No    Alcohol/week: 0.0 standard drinks  . Drug use: No    Home Medications Prior to Admission medications   Medication Sig Start Date End Date Taking? Authorizing Provider  gabapentin (NEURONTIN) 300 MG capsule Take 1 capsule (300 mg total) by mouth 2 (two) times daily. 07/11/20   Cheral Marker, CNM  HYDROcodone-acetaminophen (NORCO/VICODIN) 5-325 MG tablet Take 1 tablet by mouth 3 (three) times daily as needed. 06/08/20   [provider]  lidocaine (LIDODERM) 5 % Place 1 patch onto the skin daily as needed (back pain). May wear up to 12 hours    [provider]  methocarbamol (ROBAXIN) 500 MG tablet methocarbamol 500 mg tablet  TAKE 1 TABLET BY MOUTH TWICE A DAY AS NEEDED    [provider]  omeprazole (PRILOSEC) 40 MG capsule Take 40 mg by mouth 2 (two) times daily. 11/30/19   [provider]  paragard intrauterine copper IUD IUD  ParaGard T 380A 380 square mm intrauterine device    [provider]    Allergies    Pecan extract allergy skin test, Pecan nut (diagnostic), and Reglan [metoclopramide]  Review of Systems   Review of Systems  All other systems reviewed and are negative.   Physical Exam Updated Vital Signs BP 132/82   Pulse (!) 122   Temp 99 F (37.2 C) (Oral)   Resp 18   Ht 5\' 6"  (1.676 m)   Wt 70.3 kg   SpO2 96%   BMI 25.02 kg/m   Physical Exam Vitals and nursing note reviewed.  Constitutional:      General: She is not in acute distress.    Appearance: Normal appearance. She is not ill-appearing.  HENT:     Head: Normocephalic and atraumatic.     Ears:     Comments: The left ear is normal in appearance and the canal and with the TM.  The right TM is erythematous.  The ear canal is swollen with some drainage noted.  There is pain with manipulation of the pinna and speculum insertion. Pulmonary:     Effort: Pulmonary effort is normal.  Skin:    General: Skin is warm and dry.  Neurological:     Mental Status: She is alert and oriented to person, place, and time.     ED Results / Procedures / Treatments   Labs (all labs ordered are listed, but only abnormal results are displayed) Labs Reviewed - No data to display  EKG None  Radiology No results found.  Procedures Procedures   Medications Ordered in ED Medications  NEOMYCIN-POLYMYXIN-HYDROCORTISONE (CORTISPORIN) OTIC (EAR) solution 4 drop (has no administration in time range)  amoxicillin-clavulanate (AUGMENTIN) 500-125 MG per tablet 500 mg (has no administration in time range)    ED Course  I have reviewed the triage vital signs and the nursing notes.  Pertinent labs & imaging results that were available during my care of the patient were reviewed by me and considered in my medical decision making (see chart for details).    MDM Rules/Calculators/A&P  Patient appears to have elements of both otitis  externa and otitis media.  She will be treated with Cortisporin and Augmentin.  To follow-up with ENT if not improving in the next few days.  Final Clinical Impression(s) / ED Diagnoses Final diagnoses:  None    Rx / DC Orders ED Discharge Orders    None       , MD 04/04/21  0240  

## 2021-04-27 ENCOUNTER — Ambulatory Visit: Payer: Commercial Managed Care - PPO | Admitting: Urology

## 2021-05-04 ENCOUNTER — Other Ambulatory Visit: Payer: Self-pay

## 2021-05-04 ENCOUNTER — Ambulatory Visit: Payer: Commercial Managed Care - PPO | Admitting: Urology

## 2021-05-04 ENCOUNTER — Ambulatory Visit (INDEPENDENT_AMBULATORY_CARE_PROVIDER_SITE_OTHER): Payer: Commercial Managed Care - PPO | Admitting: Urology

## 2021-05-04 ENCOUNTER — Encounter: Payer: Self-pay | Admitting: Urology

## 2021-05-04 VITALS — BP 103/70 | HR 90

## 2021-05-04 DIAGNOSIS — R350 Frequency of micturition: Secondary | ICD-10-CM | POA: Diagnosis not present

## 2021-05-04 DIAGNOSIS — N8110 Cystocele, unspecified: Secondary | ICD-10-CM

## 2021-05-04 DIAGNOSIS — N3943 Post-void dribbling: Secondary | ICD-10-CM | POA: Diagnosis not present

## 2021-05-04 DIAGNOSIS — N3941 Urge incontinence: Secondary | ICD-10-CM | POA: Diagnosis not present

## 2021-05-04 LAB — URINALYSIS, ROUTINE W REFLEX MICROSCOPIC
Bilirubin, UA: NEGATIVE
Glucose, UA: NEGATIVE
Ketones, UA: NEGATIVE
Leukocytes,UA: NEGATIVE
Nitrite, UA: NEGATIVE
Protein,UA: NEGATIVE
RBC, UA: NEGATIVE
Specific Gravity, UA: 1.03 (ref 1.005–1.030)
Urobilinogen, Ur: 0.2 mg/dL (ref 0.2–1.0)
pH, UA: 5.5 (ref 5.0–7.5)

## 2021-05-04 LAB — BLADDER SCAN AMB NON-IMAGING: Scan Result: 2

## 2021-05-04 MED ORDER — GEMTESA 75 MG PO TABS: 75.0000 mg | ORAL_TABLET | Freq: Every day | ORAL | 0 refills | Status: DC

## 2021-05-04 NOTE — Progress Notes (Signed)
Subjective: 1. Urinary frequency   2. Urinary incontinence, post-void dribbling   3. Urge incontinence   4. Cystocele without uterine prolapse      Consult requested by Lawerance Sabal PA.   Carol Bernard is a 26 yo female who presents with an 8 month history of frequency with a sensation of incomplete emptying.  She can go multiple x within and hour and she has nocturia 3-4x.  She will continue to leak when she tries to stop the stream.  She will have to stand and sit back down to completely empty.  She has tried pelvic floor exercises without benefit.  She has rare SUI but she can have UUI if she tries to daily.  She has not been treated for this.  Her PVR is only 33ml.and her UA clear.   She has had no UTI's. She has no history of stones.  She is G7P2(Twin)M5CS1.  She is on Gabapentin for neuropathy in her feet since 2019.  She has IBS with variable symptoms with some frequent loose stools.    ROS:  ROS  Allergies  Allergen Reactions  . Pecan Extract Allergy Skin Test Rash and Swelling  . Pecan Nut (Diagnostic) Hives  . Reglan [Metoclopramide] Hives    Past Medical History:  Diagnosis Date  . Allergy   . Anxiety and depression   . Asthma   . Birth defect   . Blood transfusion without reported diagnosis   . Clotting disorder (HCC)   . Complication of anesthesia    'I had stridor after my 1st D&C but with my 2nd one they gave me a breathing treatment before my surgery and I did ok".  . Cyst of left kidney    frequent cysts to kidneys  . Dysrhythmia    h/o SVT  . Family history of adverse reaction to anesthesia   . GERD (gastroesophageal reflux disease)   . Headache   . History of recurrent miscarriages   . Hypoglycemia   . Lupus anticoagulant positive   . Neuropathic pain   . Palpitations     Past Surgical History:  Procedure Laterality Date  . CESAREAN SECTION    . CHOLECYSTECTOMY N/A 07/23/2018   Procedure: LAPAROSCOPIC CHOLECYSTECTOMY;  Surgeon: Lucretia Roers, MD;   Location: [facility];  Service: General;  Laterality: N/A;  . DILATION AND CURETTAGE OF UTERUS N/A 01/29/2017   Procedure: SUCTION DILATATION AND CURETTAGE;  Surgeon: Tilda Burrow, MD;  Location: [facility];  Service: Gynecology;  Laterality: N/A;  . DILATION AND CURETTAGE OF UTERUS N/A 05/01/2017   Procedure: SUCTION DILATATION AND CURETTAGE;  Surgeon: Lazaro Arms, MD;  Location: [facility];  Service: Gynecology;  Laterality: N/A;  pt to arrive at 7:15 to have labwork  . FETOSCOPIC LASER PHOTOCOAGULATION  11/14/2017   of 34 placental vessels for TTTS  . WISDOM TOOTH EXTRACTION      Social History   Socioeconomic History  . Marital status: Single    Spouse name: Not on file  . Number of children: Not on file  . Years of education: Not on file  . Highest education level: Not on file  Occupational History  . Occupation: Loss adjuster, chartered  . Occupation: Psychologist, sport and exercise  Tobacco Use  . Smoking status: Never Smoker  . Smokeless tobacco: Never Used  Vaping Use  . Vaping Use: Never used  Substance and Sexual Activity  . Alcohol use: No    Alcohol/week: 0.0 standard drinks  . Drug use: No  . Sexual  activity: Yes    Birth control/protection: Injection  Other Topics Concern  . Not on file  Social History Narrative  . Not on file   Social Determinants of Health   Financial Resource Strain: Not on file  Food Insecurity: Not on file  Transportation Needs: Not on file  Physical Activity: Not on file  Stress: Not on file  Social Connections: Not on file  Intimate Partner Violence: Not on file    Family History  Adopted: Yes  Problem Relation Age of Onset  . Migraines Sister   . Hypertension Maternal Grandmother   . Skin cancer Maternal Grandmother   . Heart disease Maternal Grandfather     Anti-infectives: Anti-infectives (From admission, onward)   None      Current Outpatient Medications  Medication Sig Dispense Refill  . albuterol (VENTOLIN HFA) 108 (90 Base) MCG/ACT inhaler  albuterol sulfate HFA 90 mcg/actuation aerosol inhaler  INHALE 2 PUFFS BY MOUTH FOUR TIMES DAILY    . gabapentin (NEURONTIN) 300 MG capsule Take 1 capsule (300 mg total) by mouth 2 (two) times daily. 60 capsule 11  . HYDROcodone-acetaminophen (NORCO/VICODIN) 5-325 MG tablet Take 1 tablet by mouth 3 (three) times daily as needed.    . lidocaine (LIDODERM) 5 % Place 1 patch onto the skin daily as needed (back pain). May wear up to 12 hours    . methocarbamol (ROBAXIN) 500 MG tablet methocarbamol 500 mg tablet  TAKE 1 TABLET BY MOUTH TWICE A DAY AS NEEDED    . omeprazole (PRILOSEC) 40 MG capsule Take 40 mg by mouth 2 (two) times daily.    . ondansetron (ZOFRAN ODT) 8 MG disintegrating tablet 8mg  ODT q4 hours prn nausea 8 tablet 0  . paragard intrauterine copper IUD IUD ParaGard T 380A 380 square mm intrauterine device    . SYMBICORT 80-4.5 MCG/ACT inhaler SMARTSIG:2 Inhalation By Mouth Twice Daily    . Vibegron (GEMTESA) 75 MG TABS Take 75 mg by mouth daily. 28 tablet 0   Current Facility-Administered Medications  Medication Dose Route Frequency Provider Last Rate Last Admin  . 0.9 %  sodium chloride infusion  500 mL Intravenous Once , MD         Objective: Vital signs in last 24 hours: BP 103/70   Pulse 90   Intake/Output from previous day: No intake/output data recorded. Intake/Output this shift: @IOTHISSHIFT @   Physical Exam Vitals reviewed.  Constitutional:      Appearance: Normal appearance.  Abdominal:     General: Abdomen is flat.     Palpations: Abdomen is soft. There is no mass.     Tenderness: There is no abdominal tenderness.     Hernia: No hernia is present.  Genitourinary:    Comments: Nl external genitalia. Urethral meatus normal. Normal vaginal mucosa. Moderate Urethral hypermobility without leakage. Moderate broad based cystocele with mild uterine prolapse. Cervix and uterus palpably normal. No adnexal masses.  Musculoskeletal:         General: No swelling or tenderness. Normal range of motion.  Skin:    General: Skin is warm and dry.  Neurological:     General: No focal deficit present.     Mental Status: She is alert and oriented to person, place, and time.  Psychiatric:        Mood and Affect: Mood normal.        Behavior: Behavior normal.     Lab Results:  Results for orders placed or performed in visit on 05/04/21 (from  the past 24 hour(s))  Urinalysis, Routine w reflex microscopic     Status: Abnormal   Collection Time: 05/04/21  3:05 PM  Result Value Ref Range   Specific Gravity, UA 1.030 1.005 - 1.030   pH, UA 5.5 5.0 - 7.5   Color, UA Yellow Yellow   Appearance Ur Hazy (A) Clear   Leukocytes,UA Negative Negative   Protein,UA Negative Negative/Trace   Glucose, UA Negative Negative   Ketones, UA Negative Negative   RBC, UA Negative Negative   Bilirubin, UA Negative Negative   Urobilinogen, Ur 0.2 0.2 - 1.0 mg/dL   Nitrite, UA Negative Negative   Narrative   Performed at:  [address] - [facility] [address] [place], [place]  [id number] Lab Director: Chinita Pester MT, Phone:  [phone]    BMET No results for input(s): NA, K, CL, CO2, GLUCOSE, BUN, CREATININE, CALCIUM in the last 72 hours. PT/INR No results for input(s): LABPROT, INR in the last 72 hours. ABG No results for input(s): PHART, HCO3 in the last 72 hours.  Invalid input(s): PCO2, PO2 UA is clear. Studies/Results: No results found. PVR is 35ml.  Assessment/Plan: She has OAB wet with some intermittency and a sensation of incomplete emptying but a PVR of 49ml.  She does have some prolapse with a cystocele and urethral hypermobility.   I am going to try her on Gemtesa since she is getting married in 2 weeks and would like to try something for symptom control prior to that but I will have her set up for urodynamics with f/u OV afterwards.     Meds ordered this encounter  Medications  . Vibegron (GEMTESA) 75 MG TABS    Sig:  Take 75 mg by mouth daily.    Dispense:  28 tablet    Refill:  0     Orders Placed This Encounter  Procedures  . Urinalysis, Routine w reflex microscopic  . Ambulatory referral to Urology    Referral Priority:   Routine    Referral Type:   Consultation    Referral Reason:   Specialty Services Required    Referred to Provider:   Bjorn Pippin, MD    Requested Specialty:   Urology    Number of Visits Requested:   1  . BLADDER SCAN AMB NON-IMAGING     Return for Next available with urodynamic results. .    CC: Lawerance Sabal PA.     Bjorn Pippin 05/04/2021 [phone]

## 2021-05-04 NOTE — Progress Notes (Signed)
sUrological Symptom Review  PVR 2  Patient is experiencing the following symptoms: Frequent urination Get up at night to urinate Leakage of urine Urine stream starts and stops   Review of Systems  Gastrointestinal (upper)  : Negative for upper GI symptoms  Gastrointestinal (lower) : Negative for lower GI symptoms  Constitutional : Negative for symptoms  Skin: Negative for skin symptoms  Eyes: Negative for eye symptoms  Ear/Nose/Throat : Negative for Ear/Nose/Throat symptoms  Hematologic/Lymphatic: Negative for Hematologic/Lymphatic symptoms  Cardiovascular : Negative for cardiovascular symptoms  Respiratory : Negative for respiratory symptoms  Endocrine: Negative for endocrine symptoms  Musculoskeletal: Negative for musculoskeletal symptoms  Neurological: Negative for neurological symptoms  Psychologic: Negative for psychiatric symptoms

## 2021-06-29 ENCOUNTER — Encounter: Payer: Self-pay | Admitting: Urology

## 2021-06-29 ENCOUNTER — Ambulatory Visit (INDEPENDENT_AMBULATORY_CARE_PROVIDER_SITE_OTHER): Payer: Medicaid Other | Admitting: Urology

## 2021-06-29 ENCOUNTER — Other Ambulatory Visit: Payer: Self-pay

## 2021-06-29 VITALS — BP 121/78 | HR 93 | Temp 98.7°F | Wt 160.0 lb

## 2021-06-29 DIAGNOSIS — R350 Frequency of micturition: Secondary | ICD-10-CM | POA: Diagnosis not present

## 2021-06-29 DIAGNOSIS — N3941 Urge incontinence: Secondary | ICD-10-CM | POA: Diagnosis not present

## 2021-06-29 DIAGNOSIS — N8110 Cystocele, unspecified: Secondary | ICD-10-CM | POA: Diagnosis not present

## 2021-06-29 LAB — URINALYSIS, ROUTINE W REFLEX MICROSCOPIC
Bilirubin, UA: NEGATIVE
Glucose, UA: NEGATIVE
Ketones, UA: NEGATIVE
Leukocytes,UA: NEGATIVE
Nitrite, UA: NEGATIVE
RBC, UA: NEGATIVE
Specific Gravity, UA: >1.03 — ABNORMAL HIGH (ref 1.005–1.030)
Urobilinogen, Ur: 0.2 mg/dL (ref 0.2–1.0)
pH, UA: 5.5 (ref 5.0–7.5)

## 2021-06-29 MED ORDER — MIRABEGRON ER 50 MG PO TB24: 50.0000 mg | ORAL_TABLET | Freq: Every day | ORAL | 0 refills | Status: DC

## 2021-06-29 MED ORDER — FESOTERODINE FUMARATE ER 8 MG PO TB24: 8.0000 mg | ORAL_TABLET | Freq: Every day | ORAL | 0 refills | Status: DC

## 2021-06-29 NOTE — Progress Notes (Signed)
Subjective: 1. Urinary frequency   2. Urge incontinence   3. Cystocele without uterine prolapse      06/29/21: Carol Bernard returns today in f/u.    She didn't benefit from the Crocker.   She had urodynamics and was noted to have a bladder with instability and incontinence.   She had an unstable contraction at .  She had a good FOS with a PF of 24ml/sec.   She did have a PVR on 22ml.  She required some packing because of the cystocele.        05/04/21: Carol Bernard is a 26 yo female who presents with an 8 month history of frequency with a sensation of incomplete emptying.  She can go multiple x within and hour and she has nocturia 3-4x.  She will continue to leak when she tries to stop the stream.  She will have to stand and sit back down to completely empty.  She has tried pelvic floor exercises without benefit.  She has rare SUI but she can have UUI if she tries to daily.  She has not been treated for this.  Her PVR is only 78ml.and her UA clear.   She has had no UTI's. She has no history of stones.  She is G7P2(Twin)M5CS1.  She is on Gabapentin for neuropathy in her feet since 2019.  She has IBS with variable symptoms with some frequent loose stools.    ROS:  ROS  Allergies  Allergen Reactions   Pecan Extract Allergy Skin Test Rash and Swelling   Pecan Nut (Diagnostic) Hives   Reglan [Metoclopramide] Hives    Past Medical History:  Diagnosis Date   Allergy    Anxiety and depression    Asthma    Birth defect    Blood transfusion without reported diagnosis    Clotting disorder (HCC)    Complication of anesthesia    'I had stridor after my 1st D&C but with my 2nd one they gave me a breathing treatment before my surgery and I did ok".   Cyst of left kidney    frequent cysts to kidneys   Dysrhythmia    h/o SVT   Family history of adverse reaction to anesthesia    GERD (gastroesophageal reflux disease)    Headache    History of recurrent miscarriages    Hypoglycemia    Lupus  anticoagulant positive    Neuropathic pain    Palpitations     Past Surgical History:  Procedure Laterality Date   CESAREAN SECTION     CHOLECYSTECTOMY N/A 07/23/2018   Procedure: LAPAROSCOPIC CHOLECYSTECTOMY;  Surgeon: Lucretia Roers, MD;  Location: [facility];  Service: General;  Laterality: N/A;   DILATION AND CURETTAGE OF UTERUS N/A 01/29/2017   Procedure: SUCTION DILATATION AND CURETTAGE;  Surgeon: Tilda Burrow, MD;  Location: [facility];  Service: Gynecology;  Laterality: N/A;   DILATION AND CURETTAGE OF UTERUS N/A 05/01/2017   Procedure: SUCTION DILATATION AND CURETTAGE;  Surgeon: Lazaro Arms, MD;  Location: [facility];  Service: Gynecology;  Laterality: N/A;  pt to arrive at 7:15 to have labwork   FETOSCOPIC LASER PHOTOCOAGULATION  11/14/2017   of 34 placental vessels for TTTS   WISDOM TOOTH EXTRACTION      Social History   Socioeconomic History   Marital status: Single    Spouse name: Not on file   Number of children: Not on file   Years of education: Not on file   Highest education level: Not on  file  Occupational History   Occupation: Loss adjuster, chartered   Occupation: Psychologist, sport and exercise  Tobacco Use   Smoking status: Never   Smokeless tobacco: Never  Vaping Use   Vaping Use: Never used  Substance and Sexual Activity   Alcohol use: No    Alcohol/week: 0.0 standard drinks   Drug use: No   Sexual activity: Yes    Birth control/protection: Injection  Other Topics Concern   Not on file  Social History Narrative   Not on file   Social Determinants of Health   Financial Resource Strain: Not on file  Food Insecurity: Not on file  Transportation Needs: Not on file  Physical Activity: Not on file  Stress: Not on file  Social Connections: Not on file  Intimate Partner Violence: Not on file    Family History  Adopted: Yes  Problem Relation Age of Onset   Migraines Sister    Hypertension Maternal Grandmother    Skin cancer Maternal Grandmother    Heart disease Maternal  Grandfather     Anti-infectives: Anti-infectives (From admission, onward)    None       Current Outpatient Medications  Medication Sig Dispense Refill   fesoterodine (TOVIAZ) 8 MG TB24 tablet Take 1 tablet (8 mg total) by mouth daily. Start the Gala Murdoch if you don't benefit from the Myrbetriq. 28 tablet 0   mirabegron ER (MYRBETRIQ) 50 MG TB24 tablet Take 1 tablet (50 mg total) by mouth daily. Start with the 25mg  samples and go to the 50mg  as needed. 28 tablet 0   albuterol (VENTOLIN HFA) 108 (90 Base) MCG/ACT inhaler albuterol sulfate HFA 90 mcg/actuation aerosol inhaler  INHALE 2 PUFFS BY MOUTH FOUR TIMES DAILY     amoxicillin-clavulanate (AUGMENTIN) 500-125 MG tablet amoxicillin 500 mg-potassium clavulanate 125 mg tablet  TAKE 1 TABLET BY MOUTH EVERY 8 HOURS     gabapentin (NEURONTIN) 300 MG capsule Take 1 capsule (300 mg total) by mouth 2 (two) times daily. 60 capsule 11   HYDROcodone-acetaminophen (NORCO/VICODIN) 5-325 MG tablet Take 1 tablet by mouth 3 (three) times daily as needed.     lidocaine (LIDODERM) 5 % Place 1 patch onto the skin daily as needed (back pain). May wear up to 12 hours     methocarbamol (ROBAXIN) 500 MG tablet methocarbamol 500 mg tablet  TAKE 1 TABLET BY MOUTH TWICE A DAY AS NEEDED     omeprazole (PRILOSEC) 40 MG capsule Take 40 mg by mouth 2 (two) times daily.     ondansetron (ZOFRAN ODT) 8 MG disintegrating tablet 8mg  ODT q4 hours prn nausea 8 tablet 0   paragard intrauterine copper IUD IUD ParaGard T 380A 380 square mm intrauterine device     SYMBICORT 80-4.5 MCG/ACT inhaler SMARTSIG:2 Inhalation By Mouth Twice Daily     Current Facility-Administered Medications  Medication Dose Route Frequency Provider Last Rate Last Admin   0.9 %  sodium chloride infusion  500 mL Intravenous Once , MD         Objective: Vital signs in last 24 hours: BP 121/78   Pulse 93   Temp 98.7 F (37.1 C)   Wt 160 lb (72.6 kg)   BMI 25.82 kg/m    Intake/Output from previous day: No intake/output data recorded. Intake/Output this shift: @IOTHISSHIFT @   Physical Exam  Lab Results:  Results for orders placed or performed in visit on 06/29/21 (from the past 24 hour(s))  Urinalysis, Routine w reflex microscopic     Status: Abnormal  Collection Time: 06/29/21  2:46 PM  Result Value Ref Range   Specific Gravity, UA >1.030 (H) 1.005 - 1.030   pH, UA 5.5 5.0 - 7.5   Color, UA Yellow Yellow   Appearance Ur Clear Clear   Leukocytes,UA Negative Negative   Protein,UA Trace (A) Negative/Trace   Glucose, UA Negative Negative   Ketones, UA Negative Negative   RBC, UA Negative Negative   Bilirubin, UA Negative Negative   Urobilinogen, Ur 0.2 0.2 - 1.0 mg/dL   Nitrite, UA Negative Negative   Microscopic Examination Comment    Narrative   Performed at:  [address] - [facility] [address] [place], [place]  [id number] Lab Director: Chinita Pester MT, Phone:  [phone]    BMET No results for input(s): NA, K, CL, CO2, GLUCOSE, BUN, CREATININE, CALCIUM in the last 72 hours. PT/INR No results for input(s): LABPROT, INR in the last 72 hours. ABG No results for input(s): PHART, HCO3 in the last 72 hours.  Invalid input(s): PCO2, PO2 UA is clear. Studies/Results: Urodynamic study reviewed.     Assessment/Plan: She has OAB wet with some intermittency and a sensation of incomplete emptying but a low PVR.  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   She didn't benefit from Alpha.   I discussed Myrbetriq and anticholinergics as well as PTNS, Interstim, botox and PT.   I will give her samples of Myrbetriq and Toviaz to try sequentially and refer her to Dr. Sherron Monday for further evaluation.   She does have some prolapse with a cystocele and urethral hypermobility but no apparent SUI.   She has mild uterine prolapse but is only 25 and just married so I don't think a hysterectomy would be appropriate.    Meds ordered this encounter   Medications   mirabegron ER (MYRBETRIQ) 50 MG TB24 tablet    Sig: Take 1 tablet (50 mg total) by mouth daily. Start with the 25mg  samples and go to the 50mg  as needed.    Dispense:  28 tablet    Refill:  0   fesoterodine (TOVIAZ) 8 MG TB24 tablet    Sig: Take 1 tablet (8 mg total) by mouth daily. Start the if you don't benefit from the Myrbetriq.    Dispense:  28 tablet    Refill:  0     Orders Placed This Encounter  Procedures   Urinalysis, Routine w reflex microscopic   Ambulatory referral to Urology    Referral Priority:   Routine    Referral Type:   Consultation    Referral Reason:   Specialty Services Required    Referred to Provider:   , MD    Requested Specialty:   Urology    Number of Visits Requested:   1     Return if symptoms worsen or fail to improve.    CC: Gala Murdoch PA.     Alfredo Martinez 06/29/2021 [phone]

## 2021-06-29 NOTE — Progress Notes (Signed)
Urological Symptom Review  Patient is experiencing the following symptoms: Frequent urination Get up at night to urinate Leakage of urine Stream starts and stops Have to strain to urinate   Review of Systems  Gastrointestinal (upper)  : Negative for upper GI symptoms  Gastrointestinal (lower) : Negative for lower GI symptoms  Constitutional : Fatigue  Skin: Negative for skin symptoms  Eyes: Negative for eye symptoms  Ear/Nose/Throat : Negative for Ear/Nose/Throat symptoms  Hematologic/Lymphatic: Negative for Hematologic/Lymphatic symptoms  Cardiovascular : Negative for cardiovascular symptoms  Respiratory : Negative for respiratory symptoms  Endocrine: Negative for endocrine symptoms  Musculoskeletal: Back pain  Neurological: Negative for neurological symptoms  Psychologic: Negative for psychiatric symptoms

## 2021-07-14 ENCOUNTER — Encounter (HOSPITAL_COMMUNITY): Payer: Self-pay | Admitting: Hematology

## 2021-07-17 ENCOUNTER — Other Ambulatory Visit (HOSPITAL_COMMUNITY): Payer: Commercial Managed Care - PPO

## 2021-07-18 ENCOUNTER — Other Ambulatory Visit (HOSPITAL_COMMUNITY): Payer: Self-pay | Admitting: Surgery

## 2021-07-18 DIAGNOSIS — D508 Other iron deficiency anemias: Secondary | ICD-10-CM

## 2021-07-21 ENCOUNTER — Inpatient Hospital Stay (HOSPITAL_COMMUNITY): Payer: Commercial Managed Care - PPO | Attending: Hematology

## 2021-07-21 ENCOUNTER — Other Ambulatory Visit: Payer: Self-pay

## 2021-07-21 ENCOUNTER — Other Ambulatory Visit (HOSPITAL_COMMUNITY): Payer: Commercial Managed Care - PPO

## 2021-07-21 ENCOUNTER — Encounter (HOSPITAL_COMMUNITY): Payer: Self-pay | Admitting: Hematology

## 2021-07-21 DIAGNOSIS — D508 Other iron deficiency anemias: Secondary | ICD-10-CM

## 2021-07-21 DIAGNOSIS — D509 Iron deficiency anemia, unspecified: Secondary | ICD-10-CM | POA: Diagnosis present

## 2021-07-21 LAB — COMPREHENSIVE METABOLIC PANEL
ALT: 24 U/L (ref 0–44)
AST: 22 U/L (ref 15–41)
Albumin: 4.9 g/dL (ref 3.5–5.0)
Alkaline Phosphatase: 53 U/L (ref 38–126)
Anion gap: 6 (ref 5–15)
BUN: 14 mg/dL (ref 6–20)
CO2: 26 mmol/L (ref 22–32)
Calcium: 9.3 mg/dL (ref 8.9–10.3)
Chloride: 106 mmol/L (ref 98–111)
Creatinine, Ser: 0.61 mg/dL (ref 0.44–1.00)
GFR, Estimated: >60 mL/min (ref >60)
Glucose, Bld: 114 mg/dL — ABNORMAL HIGH (ref 70–99)
Potassium: 4.2 mmol/L (ref 3.5–5.1)
Sodium: 138 mmol/L (ref 135–145)
Total Bilirubin: 1.5 mg/dL — ABNORMAL HIGH (ref 0.3–1.2)
Total Protein: 8.3 g/dL — ABNORMAL HIGH (ref 6.5–8.1)

## 2021-07-21 LAB — CBC WITH DIFFERENTIAL/PLATELET
Abs Immature Granulocytes: 0.04 10*3/uL (ref 0.00–0.07)
Basophils Absolute: 0 10*3/uL (ref 0.0–0.1)
Basophils Relative: 0 %
Eosinophils Absolute: 0.1 10*3/uL (ref 0.0–0.5)
Eosinophils Relative: 1 %
HCT: 43.1 % (ref 36.0–46.0)
Hemoglobin: 14.3 g/dL (ref 12.0–15.0)
Immature Granulocytes: 1 %
Lymphocytes Relative: 18 %
Lymphs Abs: 1.4 10*3/uL (ref 0.7–4.0)
MCH: 29.9 pg (ref 26.0–34.0)
MCHC: 33.2 g/dL (ref 30.0–36.0)
MCV: 90.2 fL (ref 80.0–100.0)
Monocytes Absolute: 0.3 10*3/uL (ref 0.1–1.0)
Monocytes Relative: 4 %
Neutro Abs: 5.8 10*3/uL (ref 1.7–7.7)
Neutrophils Relative %: 76 %
Platelets: 223 10*3/uL (ref 150–400)
RBC: 4.78 MIL/uL (ref 3.87–5.11)
RDW: 13 % (ref 11.5–15.5)
WBC: 7.7 10*3/uL (ref 4.0–10.5)
nRBC: 0 % (ref 0.0–0.2)

## 2021-07-21 LAB — IRON AND TIBC
Iron: 96 ug/dL (ref 28–170)
Saturation Ratios: 28 % (ref 10.4–31.8)
TIBC: 346 ug/dL (ref 250–450)
UIBC: 250 ug/dL

## 2021-07-21 LAB — FOLATE: Folate: 12.1 ng/mL (ref >5.9)

## 2021-07-21 LAB — LACTATE DEHYDROGENASE: LDH: 143 U/L (ref 98–192)

## 2021-07-21 LAB — VITAMIN B12: Vitamin B-12: 633 pg/mL (ref 180–914)

## 2021-07-21 LAB — VITAMIN D 25 HYDROXY (VIT D DEFICIENCY, FRACTURES): Vit D, 25-Hydroxy: 31.63 ng/mL (ref 30–100)

## 2021-07-21 LAB — FERRITIN: Ferritin: 136 ng/mL (ref 11–307)

## 2021-07-24 ENCOUNTER — Ambulatory Visit (HOSPITAL_COMMUNITY): Payer: Commercial Managed Care - PPO | Admitting: Hematology

## 2021-07-25 NOTE — Progress Notes (Signed)
Virtual Visit via Telephone Note [facility]  I connected with Carol Bernard  on 07/26/21  at 1:39 PM by telephone and verified that I am speaking with the correct person using two identifiers.  Location: Patient: Home Provider: [facility]   I discussed the limitations, risks, security and privacy concerns of performing an evaluation and management service by telephone and the availability of in person appointments. I also discussed with the patient that there may be a patient responsible charge related to this service. The patient expressed understanding and agreed to proceed.  DIAGNOSIS: Iron deficiency anemia   CURRENT THERAPY: Intermittent iron infusions  HISTORY OF PRESENT ILLNESS: Carol Bernard was called for telephone visit for her iron deficiency anemia.  She was last evaluated by our clinic by NP Durenda Hurt via telephone visit on 01/17/2021.  Last iron infusion was with IV Venofer 200 mg x 2 doses (02/03/2021 and 02/09/2021).  Patient reports that she felt that her energy level improved after her IV iron infusions in February, but that she has been starting to feel "worn down again."  She reports that her menses are much lighter now that she is on ParaGard.  She denies any other sources of blood loss such as epistaxis, melena, hemoptysis, hematemesis, or hematochezia.  She reports fatigue, with current energy level about 30%.  She denies other symptoms such as chest pain, dyspnea, palpitations, syncope, fever, chills, night sweats, unintentional weight loss.  She reports that her current energy level is 30%, with 75% appetite.  She is eating well to maintain a stable weight at this time.    OBSERVATIONS/OBJECTIVE: Review of Systems  Constitutional:  Positive for malaise/fatigue. Negative for chills, diaphoresis, fever and weight loss.  Respiratory:  Negative for cough and shortness of breath.   Cardiovascular:  Negative for chest pain and  palpitations.  Gastrointestinal:  Positive for abdominal pain (intermittent abdominal pain, followed by GI). Negative for blood in stool, melena, nausea and vomiting.  Neurological:  Negative for dizziness and headaches.    PHYSICAL EXAM (per limitations of virtual telephone visit): The patient is alert and oriented x 3, exhibiting adequate mentation, good mood, and ability to speak in full sentences and execute sound judgement.   ASSESSMENT & PLAN: 1.  Iron deficiency anemia: - She denies menorrhagia/heavy periods.  She is on ParaGard for birth control. - Last iron infusion was with IV Venofer 200 mg x 2 doses (02/03/2021 and 02/09/2021), reports improved energy after IV iron. - Labs on 07/21/2021 show normal Hgb 14.3 with MCV 90.2 and normal differential; ferritin 136 with 28% iron saturation.  Folate, vitamin D, B12 normal - PLAN: No indication for IV iron infusions at this time.  RTC in 6 months for repeat labs and office visit.   2.  Indirect hyperbilirubinemia: -She was found to have elevated total bilirubin on 04/01/2019 at 2.7. -LDH was mildly elevated.  It was predominantly indirect bilirubin.  Direct Coombs test was negative for IgG and complement. -DAT negative hemolytic anemia evaluation [place] labs were also negative. -Differential diagnosis includes Gilbert's syndrome, and she was referred to gastroenterology -GI did upper endoscopy on 01/15/2020 with biopsies.  Did showed mild chronic gastritis.  No other pathological findings; negative for celiac disease.  No source of bleeding. - PLAN: Continue follow-up with gastroenterology.  We will continue to monitor due to borderline splenomegaly and previous thrombocytopenia.   3.  Vitamin D deficiency: - Labs done on 07/21/2021 show normal vitamin  D level at 31.63 - Patient reports she takes vitamin D twice daily. - PLAN: Continue current dose of vitamin D.   FOLLOW UP INSTRUCTIONS: - Labs and RTC in 6 months (in-person visit)    I  discussed the assessment and treatment plan with the patient. The patient was provided an opportunity to ask questions and all were answered. The patient agreed with the plan and demonstrated an understanding of the instructions.   The patient was advised to call back or seek an in-person evaluation if the symptoms worsen or if the condition fails to improve as anticipated.  I provided 13 minutes of non-face-to-face time during this encounter.   Carnella Guadalajara, PA-C 07/26/2021 2:04 PM

## 2021-07-26 ENCOUNTER — Encounter (HOSPITAL_COMMUNITY): Payer: Self-pay | Admitting: Physician Assistant

## 2021-07-26 ENCOUNTER — Ambulatory Visit (HOSPITAL_COMMUNITY): Payer: Commercial Managed Care - PPO | Admitting: Hematology

## 2021-07-26 ENCOUNTER — Inpatient Hospital Stay (HOSPITAL_BASED_OUTPATIENT_CLINIC_OR_DEPARTMENT_OTHER): Payer: Commercial Managed Care - PPO | Admitting: Physician Assistant

## 2021-07-26 ENCOUNTER — Other Ambulatory Visit: Payer: Self-pay

## 2021-07-26 DIAGNOSIS — D508 Other iron deficiency anemias: Secondary | ICD-10-CM

## 2021-11-01 ENCOUNTER — Telehealth: Payer: Self-pay | Admitting: Urology

## 2021-11-01 NOTE — Telephone Encounter (Signed)
Per Junious Dresser at [facility] [phone]), patient declined consult appointment with Dr. Sherron Monday at [facility] for cystocele without uterine prolapse, urge incontinence.

## 2022-02-13 ENCOUNTER — Other Ambulatory Visit (HOSPITAL_COMMUNITY): Payer: Commercial Managed Care - PPO

## 2022-02-13 ENCOUNTER — Inpatient Hospital Stay (HOSPITAL_COMMUNITY): Payer: Commercial Managed Care - PPO | Attending: Physician Assistant

## 2022-02-13 DIAGNOSIS — D509 Iron deficiency anemia, unspecified: Secondary | ICD-10-CM | POA: Diagnosis present

## 2022-02-13 DIAGNOSIS — Z808 Family history of malignant neoplasm of other organs or systems: Secondary | ICD-10-CM | POA: Diagnosis not present

## 2022-02-13 DIAGNOSIS — D508 Other iron deficiency anemias: Secondary | ICD-10-CM

## 2022-02-13 DIAGNOSIS — E559 Vitamin D deficiency, unspecified: Secondary | ICD-10-CM | POA: Insufficient documentation

## 2022-02-13 LAB — CBC WITH DIFFERENTIAL/PLATELET
Abs Immature Granulocytes: 0.01 10*3/uL (ref 0.00–0.07)
Basophils Absolute: 0 10*3/uL (ref 0.0–0.1)
Basophils Relative: 0 %
Eosinophils Absolute: 0.1 10*3/uL (ref 0.0–0.5)
Eosinophils Relative: 2 %
HCT: 41.1 % (ref 36.0–46.0)
Hemoglobin: 13.5 g/dL (ref 12.0–15.0)
Immature Granulocytes: 0 %
Lymphocytes Relative: 16 %
Lymphs Abs: 0.9 10*3/uL (ref 0.7–4.0)
MCH: 28.5 pg (ref 26.0–34.0)
MCHC: 32.8 g/dL (ref 30.0–36.0)
MCV: 86.9 fL (ref 80.0–100.0)
Monocytes Absolute: 0.5 10*3/uL (ref 0.1–1.0)
Monocytes Relative: 10 %
Neutro Abs: 3.7 10*3/uL (ref 1.7–7.7)
Neutrophils Relative %: 72 %
Platelets: 167 10*3/uL (ref 150–400)
RBC: 4.73 MIL/uL (ref 3.87–5.11)
RDW: 12.5 % (ref 11.5–15.5)
WBC: 5.2 10*3/uL (ref 4.0–10.5)
nRBC: 0 % (ref 0.0–0.2)

## 2022-02-13 LAB — FERRITIN: Ferritin: 134 ng/mL (ref 11–307)

## 2022-02-13 LAB — IRON AND TIBC
Iron: 56 ug/dL (ref 28–170)
Saturation Ratios: 20 % (ref 10.4–31.8)
TIBC: 276 ug/dL (ref 250–450)
UIBC: 220 ug/dL

## 2022-02-15 NOTE — Progress Notes (Signed)
Carol Bernard, [place] [address]   CLINIC:  Medical Oncology/Hematology  PCP:  Denny Levy, [place] [address] [phone]   REASON FOR VISIT:  Follow-up for iron deficiency anemia  CURRENT THERAPY: Intermittent IV iron infusions  INTERVAL HISTORY:  Carol Bernard 27 y.o. female returns for routine follow-up of her iron deficiency anemia.  She was last evaluated by Tarri Abernethy PA-C via telemedicine visit on 07/26/2021.  At today's visit, she reports feeling poorly due to fatigue.  She has some underlying chronic fatigue, that has been particularly worsened over the past 3 months, with severe fatigue over the last 2 to 3 weeks.  She admits to pica cravings for ice chips.  She denies any restless legs or headaches.  She has not had any lightheadedness or syncopal episodes.  She denies any epistaxis, hematemesis, hematochezia, or melena.  She reports that her periods are light secondary to using ParaGard IUD.  She is taking iron pill once daily.  Additional symptoms further described in ROS, as they are not directly pertinent to today's visit.  She has 60% energy and 60% appetite. She endorses that she is maintaining a stable weight.   REVIEW OF SYSTEMS:  Review of Systems  Constitutional:  Positive for appetite change (appetite 60%) and fatigue. Negative for chills, diaphoresis, fever and unexpected weight change.  HENT:   Negative for lump/mass and nosebleeds.   Eyes:  Negative for eye problems.  Respiratory:  Positive for shortness of breath (intermittent from asthma). Negative for cough and hemoptysis.   Cardiovascular:  Positive for chest pain (occasional, follows with cardiology). Negative for leg swelling and palpitations.  Gastrointestinal:  Positive for nausea (being worked up by PCP for possible stomach ulcers). Negative for abdominal pain, blood in stool, constipation, diarrhea and vomiting.  Genitourinary:  Negative  for hematuria.   Skin: Negative.   Neurological:  Positive for dizziness and numbness (tingling). Negative for headaches and light-headedness.  Hematological:  Does not bruise/bleed easily.  Psychiatric/Behavioral:  Positive for sleep disturbance.      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   Cyst of left kidney    frequent cysts to kidneys   Dysrhythmia    h/o SVT   Family history of adverse reaction to anesthesia    GERD (gastroesophageal reflux disease)    Headache    History of recurrent miscarriages    Hypoglycemia    Lupus anticoagulant positive    Neuropathic pain    Palpitations    Past Surgical History:  Procedure Laterality Date   CESAREAN SECTION     CHOLECYSTECTOMY N/A 07/23/2018   Procedure: LAPAROSCOPIC CHOLECYSTECTOMY;  Surgeon: Virl Cagey, MD;  Location: [facility];  Service: General;  Laterality: N/A;   DILATION AND CURETTAGE OF UTERUS N/A 01/29/2017   Procedure: SUCTION DILATATION AND CURETTAGE;  Surgeon: Jonnie Kind, MD;  Location: [facility];  Service: Gynecology;  Laterality: N/A;   DILATION AND CURETTAGE OF UTERUS N/A 05/01/2017   Procedure: SUCTION DILATATION AND CURETTAGE;  Surgeon: Florian Buff, MD;  Location: [facility];  Service: Gynecology;  Laterality: N/A;  pt to arrive at 7:15 to have labwork   FETOSCOPIC LASER PHOTOCOAGULATION  11/14/2017   of 34 placental vessels for TTTS   WISDOM TOOTH EXTRACTION       SOCIAL HISTORY:  Social History   Socioeconomic History   Marital status: Married    Spouse name: Not on file   Number of children: Not on file   Years of education: Not on file   Highest education level: Not on file   Occupational History   Occupation: Doctor, hospital   Occupation: Chartered certified accountant  Tobacco Use   Smoking status: Never   Smokeless tobacco: Never  Vaping Use   Vaping Use: Never used  Substance and Sexual Activity   Alcohol use: No    Alcohol/week: 0.0 standard drinks   Drug use: No   Sexual activity: Yes    Birth control/protection: Injection  Other Topics Concern   Not on file  Social History Narrative   Not on file   Social Determinants of Health   Financial Resource Strain: Not on file  Food Insecurity: Not on file  Transportation Needs: Not on file  Physical Activity: Not on file  Stress: Not on file  Social Connections: Not on file  Intimate Partner Violence: Not on file    FAMILY HISTORY:  Family History  Adopted: Yes  Problem Relation Age of Onset   Migraines Sister    Hypertension Maternal Grandmother    Skin cancer Maternal Grandmother    Heart disease Maternal Grandfather     CURRENT MEDICATIONS:  Outpatient Encounter Medications as of 02/16/2022  Medication Sig   albuterol (VENTOLIN HFA) 108 (90 Base) MCG/ACT inhaler albuterol sulfate HFA 90 mcg/actuation aerosol inhaler  INHALE 2 PUFFS BY MOUTH FOUR TIMES DAILY   amoxicillin-clavulanate (AUGMENTIN) 500-125 MG tablet amoxicillin 500 mg-potassium clavulanate 125 mg tablet  TAKE 1 TABLET BY MOUTH EVERY 8 HOURS (Patient not taking: Reported on 07/26/2021)   fesoterodine (TOVIAZ) 8 MG TB24 tablet Take 1 tablet (8 mg total) by mouth daily. Start the Lisbeth Ply if you don't benefit from the Myrbetriq. (Patient not taking: Reported on 07/26/2021)   gabapentin (NEURONTIN) 300 MG capsule Take 1 capsule (300 mg total) by mouth 2 (two) times daily.   HYDROcodone-acetaminophen (NORCO/VICODIN) 5-325 MG tablet Take 1 tablet by mouth 3 (three) times daily as needed.   lidocaine (LIDODERM) 5 % Place 1 patch onto the skin daily as needed (back pain). May wear up to 12 hours   methocarbamol (ROBAXIN) 500 MG tablet methocarbamol  500 mg tablet  TAKE 1 TABLET BY MOUTH TWICE A DAY AS NEEDED   mirabegron ER (MYRBETRIQ) 50 MG TB24 tablet Take 1 tablet (50 mg total) by mouth daily. Start with the 25mg  samples and go to the 50mg  as needed. (Patient not taking: Reported on 07/26/2021)   omeprazole (PRILOSEC) 40 MG capsule Take 40 mg by mouth 2 (two) times daily.   ondansetron (ZOFRAN ODT) 8 MG disintegrating tablet 8mg  ODT q4 hours prn nausea   paragard intrauterine copper IUD IUD ParaGard T 380A 380 square mm intrauterine device   SYMBICORT 80-4.5 MCG/ACT inhaler SMARTSIG:2 Inhalation By Mouth Twice Daily   Facility-Administered Encounter Medications as of 02/16/2022  Medication   0.9 %  sodium chloride infusion    ALLERGIES:  Allergies  Allergen Reactions   Pecan Extract Allergy Skin Test Rash and Swelling   Pecan Nut (Diagnostic) Hives   Reglan [Metoclopramide] Hives  PHYSICAL EXAM:  ECOG PERFORMANCE STATUS: 1 - Symptomatic but completely ambulatory  There were no vitals filed for this visit. There were no vitals filed for this visit. Physical Exam Constitutional:      Appearance: Normal appearance.  HENT:     Head: Normocephalic and atraumatic.     Mouth/Throat:     Mouth: Mucous membranes are moist.  Eyes:     Extraocular Movements: Extraocular movements intact.     Pupils: Pupils are equal, round, and reactive to light.  Cardiovascular:     Rate and Rhythm: Normal rate and regular rhythm.     Pulses: Normal pulses.     Heart sounds: Normal heart sounds.  Pulmonary:     Effort: Pulmonary effort is normal.     Breath sounds: Normal breath sounds.  Abdominal:     General: Bowel sounds are normal.     Palpations: Abdomen is soft.     Tenderness: There is no abdominal tenderness.  Musculoskeletal:        General: No swelling.     Right lower leg: No edema.     Left lower leg: No edema.  Lymphadenopathy:     Cervical: No cervical adenopathy.  Skin:    General: Skin is warm and dry.   Neurological:     General: No focal deficit present.     Mental Status: She is alert and oriented to person, place, and time.  Psychiatric:        Mood and Affect: Mood normal.        Behavior: Behavior normal.     LABORATORY DATA:  I have reviewed the labs as listed.  CBC    Component Value Date/Time   WBC 5.2 02/13/2022 0904   RBC 4.73 02/13/2022 0904   HGB 13.5 02/13/2022 0904   HGB 12.1 04/28/2018 1611   HCT 41.1 02/13/2022 0904   HCT 35.5 04/28/2018 1611   PLT 167 02/13/2022 0904   PLT 187 04/28/2018 1611   MCV 86.9 02/13/2022 0904   MCV 83 04/28/2018 1611   MCH 28.5 02/13/2022 0904   MCHC 32.8 02/13/2022 0904   RDW 12.5 02/13/2022 0904   RDW 14.0 04/28/2018 1611   LYMPHSABS 0.9 02/13/2022 0904   LYMPHSABS 1.4 04/28/2018 1611   MONOABS 0.5 02/13/2022 0904   EOSABS 0.1 02/13/2022 0904   EOSABS 0.1 04/28/2018 1611   BASOSABS 0.0 02/13/2022 0904   BASOSABS 0.0 04/28/2018 1611   CMP Latest Ref Rng & Units 07/21/2021 01/17/2021 07/15/2020  Glucose 70 - 99 mg/dL 114(H) 109(H) 105(H)  BUN 6 - 20 mg/dL 14 11 13   Creatinine 0.44 - 1.00 mg/dL 0.61 0.65 0.62  Sodium 135 - 145 mmol/L 138 138 138  Potassium 3.5 - 5.1 mmol/L 4.2 4.1 4.6  Chloride 98 - 111 mmol/L 106 109 106  CO2 22 - 32 mmol/L 26 24 25   Calcium 8.9 - 10.3 mg/dL 9.3 9.0 9.0  Total Protein 6.5 - 8.1 g/dL 8.3(H) 7.3 7.5  Total Bilirubin 0.3 - 1.2 mg/dL 1.5(H) 1.2 1.5(H)  Alkaline Phos 38 - 126 U/L 53 45 53  AST 15 - 41 U/L 22 17 17   ALT 0 - 44 U/L 24 18 22     DIAGNOSTIC IMAGING:  I have independently reviewed the relevant imaging and discussed with the patient.  ASSESSMENT & PLAN: 1.  Iron deficiency anemia: - She denies menorrhagia/heavy periods.  She previously experienced heavy periods, but this is improved after she started ParaGard for birth control.  No  bright red blood per rectum or melena. - Last iron infusion (ferritin 72) was with IV Venofer 200 mg x 2 doses (02/03/2021 and 02/09/2021), reports  improved energy after IV iron. - She is symptomatic with worsening fatigue, reports that IV iron is helped with this in the past - Most recent labs (02/13/2022): Hgb 13.5, MCV 86.9, ferritin 134, iron saturation 20% - PLAN: IV Venofer 200 mg x 2 doses. - RTC in 6 months for repeat labs and office visit   2.  Indirect hyperbilirubinemia: - She was found to have elevated total bilirubin on 04/01/2019 at 2.7. - LDH was mildly elevated.  It was predominantly indirect bilirubin.  Direct [place] test was negative for IgG and complement. - DAT negative hemolytic anemia evaluation [place] labs were also negative. - Differential diagnosis includes Gilbert's syndrome, and she was referred to gastroenterology - GI did upper endoscopy on 01/15/2020 with biopsies.  Did showed mild chronic gastritis.  No other pathological findings; negative for celiac disease.  No source of bleeding. - PLAN: Continue follow-up with gastroenterology.  We will continue to monitor due to borderline splenomegaly and previous thrombocytopenia.   3.  Vitamin D deficiency: - Labs done on 07/21/2021 show normal vitamin D level at 31.63 - Patient reports she takes vitamin D twice daily. - PLAN: Continue current dose of vitamin D.    PLAN SUMMARY & DISPOSITION: IV Venofer 200 mg x 2 doses Labs and office visit in 6 months  All questions were answered. The patient knows to call the clinic with any problems, questions or concerns.  Medical decision making: Low  Time spent on visit: I spent 15 minutes counseling the patient face to face. The total time spent in the appointment was 25 minutes and more than 50% was on counseling.   Harriett Rush, PA-C  02/16/2022 11:31 AM

## 2022-02-16 ENCOUNTER — Inpatient Hospital Stay (HOSPITAL_BASED_OUTPATIENT_CLINIC_OR_DEPARTMENT_OTHER): Payer: Commercial Managed Care - PPO | Admitting: Physician Assistant

## 2022-02-16 ENCOUNTER — Ambulatory Visit (HOSPITAL_COMMUNITY): Payer: Commercial Managed Care - PPO | Admitting: Physician Assistant

## 2022-02-16 ENCOUNTER — Encounter (HOSPITAL_COMMUNITY): Payer: Self-pay | Admitting: Physician Assistant

## 2022-02-16 ENCOUNTER — Other Ambulatory Visit: Payer: Self-pay

## 2022-02-16 VITALS — BP 97/72 | HR 95 | Temp 97.8°F | Resp 18 | Ht 65.0 in | Wt 145.5 lb

## 2022-02-16 DIAGNOSIS — R5383 Other fatigue: Secondary | ICD-10-CM | POA: Diagnosis not present

## 2022-02-16 DIAGNOSIS — D508 Other iron deficiency anemias: Secondary | ICD-10-CM

## 2022-02-16 DIAGNOSIS — D509 Iron deficiency anemia, unspecified: Secondary | ICD-10-CM | POA: Diagnosis not present

## 2022-02-16 HISTORY — DX: Other fatigue: R53.83

## 2022-02-21 ENCOUNTER — Inpatient Hospital Stay (HOSPITAL_COMMUNITY): Payer: Commercial Managed Care - PPO

## 2022-02-21 VITALS — BP 108/77 | HR 91 | Temp 98.4°F | Resp 18

## 2022-02-21 DIAGNOSIS — D509 Iron deficiency anemia, unspecified: Secondary | ICD-10-CM | POA: Diagnosis not present

## 2022-02-21 DIAGNOSIS — D508 Other iron deficiency anemias: Secondary | ICD-10-CM

## 2022-02-21 DIAGNOSIS — R5383 Other fatigue: Secondary | ICD-10-CM

## 2022-02-21 MED ORDER — SODIUM CHLORIDE 0.9 % IV SOLN
200.0000 mg | Freq: Once | INTRAVENOUS | Status: AC
  Administered 2022-02-21: 200 mg via INTRAVENOUS
  Filled 2022-02-21: qty 10

## 2022-02-21 MED ORDER — ACETAMINOPHEN 325 MG PO TABS
650.0000 mg | ORAL_TABLET | Freq: Once | ORAL | Status: AC
  Administered 2022-02-21: 650 mg via ORAL
  Filled 2022-02-21: qty 2

## 2022-02-21 MED ORDER — LORATADINE 10 MG PO TABS
10.0000 mg | ORAL_TABLET | Freq: Once | ORAL | Status: AC
  Administered 2022-02-21: 10 mg via ORAL
  Filled 2022-02-21: qty 1

## 2022-02-21 MED ORDER — SODIUM CHLORIDE 0.9 % IV SOLN: Freq: Once | INTRAVENOUS | Status: AC

## 2022-02-21 NOTE — Progress Notes (Signed)
Patient presents today for Venofer infusion per providers order.  Vital signs WNL.  Patient has no new complaints at this time.  Peripheral IV started and blood return noted pre and post infusion.  Venofer infusion given today per MD orders.  Stable during infusion without adverse affects.  Vital signs stable.  No complaints at this time.  Discharge from clinic ambulatory in stable condition.  Alert and oriented X 3.  Follow up with [facility] as scheduled.  

## 2022-02-21 NOTE — Patient Instructions (Signed)
[facility]  Discharge Instructions: Thank you for choosing [facility] to provide your oncology and hematology care.  If you have a lab appointment with the Cancer Center, please come in thru the Main Entrance and check in at the main information desk.  Wear comfortable clothing and clothing appropriate for easy access to any Portacath or PICC line.   We strive to give you quality time with your provider. You may need to reschedule your appointment if you arrive late (15 or more minutes).  Arriving late affects you and other patients whose appointments are after yours.  Also, if you miss three or more appointments without notifying the office, you may be dismissed from the clinic at the provider's discretion.      For prescription refill requests, have your pharmacy contact our office and allow 72 hours for refills to be completed.    Today you received the following chemotherapy and/or immunotherapy agents Venofer      To help prevent nausea and vomiting after your treatment, we encourage you to take your nausea medication as directed.  BELOW ARE SYMPTOMS THAT SHOULD BE REPORTED IMMEDIATELY: *FEVER GREATER THAN 100.4 F (38 C) OR HIGHER *CHILLS OR SWEATING *NAUSEA AND VOMITING THAT IS NOT CONTROLLED WITH YOUR NAUSEA MEDICATION *UNUSUAL SHORTNESS OF BREATH *UNUSUAL BRUISING OR BLEEDING *URINARY PROBLEMS (pain or burning when urinating, or frequent urination) *BOWEL PROBLEMS (unusual diarrhea, constipation, pain near the anus) TENDERNESS IN MOUTH AND THROAT WITH OR WITHOUT PRESENCE OF ULCERS (sore throat, sores in mouth, or a toothache) UNUSUAL RASH, SWELLING OR PAIN  UNUSUAL VAGINAL DISCHARGE OR ITCHING   Items with * indicate a potential emergency and should be followed up as soon as possible or go to the Emergency Department if any problems should occur.  Please show the CHEMOTHERAPY ALERT CARD or IMMUNOTHERAPY ALERT CARD at check-in to the Emergency  Department and triage nurse.  Should you have questions after your visit or need to cancel or reschedule your appointment, please contact [facility] [phone]  and follow the prompts.  Office hours are 8:00 a.m. to 4:30 p.m. Monday - Friday. Please note that voicemails left after 4:00 p.m. may not be returned until the following business day.  We are closed weekends and major holidays. You have access to a nurse at all times for urgent questions. Please call the main number to the clinic [phone] and follow the prompts.  For any non-urgent questions, you may also contact your provider using MyChart. We now offer e-Visits for anyone 18 and older to request care online for non-urgent symptoms. For details visit [email or web address].   Also download the MyChart app! Go to the app store, search "MyChart", open the app, select [facility], and log in with your MyChart username and password.  Due to Covid, a mask is required upon entering the hospital/clinic. If you do not have a mask, one will be given to you upon arrival. For doctor visits, patients may have 1 support person aged 18 or older with them. For treatment visits, patients cannot have anyone with them due to current Covid guidelines and our immunocompromised population.  

## 2022-03-07 ENCOUNTER — Other Ambulatory Visit: Payer: Self-pay

## 2022-03-07 ENCOUNTER — Inpatient Hospital Stay (HOSPITAL_COMMUNITY): Payer: Commercial Managed Care - PPO | Attending: Physician Assistant

## 2022-03-07 VITALS — BP 101/71 | HR 65 | Temp 97.4°F | Resp 18

## 2022-03-07 DIAGNOSIS — D509 Iron deficiency anemia, unspecified: Secondary | ICD-10-CM | POA: Diagnosis present

## 2022-03-07 DIAGNOSIS — Z79899 Other long term (current) drug therapy: Secondary | ICD-10-CM | POA: Insufficient documentation

## 2022-03-07 DIAGNOSIS — R5383 Other fatigue: Secondary | ICD-10-CM

## 2022-03-07 DIAGNOSIS — D508 Other iron deficiency anemias: Secondary | ICD-10-CM

## 2022-03-07 MED ORDER — SODIUM CHLORIDE 0.9 % IV SOLN: Freq: Once | INTRAVENOUS | Status: AC

## 2022-03-07 MED ORDER — ACETAMINOPHEN 325 MG PO TABS
650.0000 mg | ORAL_TABLET | Freq: Once | ORAL | Status: AC
  Administered 2022-03-07: 650 mg via ORAL
  Filled 2022-03-07: qty 2

## 2022-03-07 MED ORDER — SODIUM CHLORIDE 0.9 % IV SOLN
200.0000 mg | Freq: Once | INTRAVENOUS | Status: AC
  Administered 2022-03-07: 200 mg via INTRAVENOUS
  Filled 2022-03-07: qty 200

## 2022-03-07 MED ORDER — LORATADINE 10 MG PO TABS
10.0000 mg | ORAL_TABLET | Freq: Once | ORAL | Status: AC
  Administered 2022-03-07: 10 mg via ORAL
  Filled 2022-03-07: qty 1

## 2022-03-07 NOTE — Patient Instructions (Signed)
[facility]  Discharge Instructions: Thank you for choosing [facility] to provide your oncology and hematology care.  If you have a lab appointment with the Cancer Center, please come in thru the Main Entrance and check in at the main information desk.  Wear comfortable clothing and clothing appropriate for easy access to any Portacath or PICC line.   We strive to give you quality time with your provider. You may need to reschedule your appointment if you arrive late (15 or more minutes).  Arriving late affects you and other patients whose appointments are after yours.  Also, if you miss three or more appointments without notifying the office, you may be dismissed from the clinic at the provider's discretion.      For prescription refill requests, have your pharmacy contact our office and allow 72 hours for refills to be completed.        To help prevent nausea and vomiting after your treatment, we encourage you to take your nausea medication as directed.  BELOW ARE SYMPTOMS THAT SHOULD BE REPORTED IMMEDIATELY: *FEVER GREATER THAN 100.4 F (38 C) OR HIGHER *CHILLS OR SWEATING *NAUSEA AND VOMITING THAT IS NOT CONTROLLED WITH YOUR NAUSEA MEDICATION *UNUSUAL SHORTNESS OF BREATH *UNUSUAL BRUISING OR BLEEDING *URINARY PROBLEMS (pain or burning when urinating, or frequent urination) *BOWEL PROBLEMS (unusual diarrhea, constipation, pain near the anus) TENDERNESS IN MOUTH AND THROAT WITH OR WITHOUT PRESENCE OF ULCERS (sore throat, sores in mouth, or a toothache) UNUSUAL RASH, SWELLING OR PAIN  UNUSUAL VAGINAL DISCHARGE OR ITCHING   Items with * indicate a potential emergency and should be followed up as soon as possible or go to the Emergency Department if any problems should occur.  Please show the CHEMOTHERAPY ALERT CARD or IMMUNOTHERAPY ALERT CARD at check-in to the Emergency Department and triage nurse.  Should you have questions after your visit or need to cancel  or reschedule your appointment, please contact [facility] [phone]  and follow the prompts.  Office hours are 8:00 a.m. to 4:30 p.m. Monday - Friday. Please note that voicemails left after 4:00 p.m. may not be returned until the following business day.  We are closed weekends and major holidays. You have access to a nurse at all times for urgent questions. Please call the main number to the clinic [phone] and follow the prompts.  For any non-urgent questions, you may also contact your provider using MyChart. We now offer e-Visits for anyone 18 and older to request care online for non-urgent symptoms. For details visit [email or web address].   Also download the MyChart app! Go to the app store, search "MyChart", open the app, select [facility], and log in with your MyChart username and password.  Due to Covid, a mask is required upon entering the hospital/clinic. If you do not have a mask, one will be given to you upon arrival. For doctor visits, patients may have 1 support person aged 18 or older with them. For treatment visits, patients cannot have anyone with them due to current Covid guidelines and our immunocompromised population.  

## 2022-03-07 NOTE — Progress Notes (Signed)
Patient presents today for iron infusion.  Patient is in satisfactory condition with no new complaints voiced.  Vital signs are stable.  We will proceed with treatment per MD orders.   Patient tolerated treatment well with no complaints voiced.  Patient left ambulatory in stable condition.  Vital signs stable at discharge.  Follow up as scheduled.    

## 2022-03-09 ENCOUNTER — Encounter (HOSPITAL_COMMUNITY): Payer: Self-pay | Admitting: Hematology

## 2022-08-16 ENCOUNTER — Inpatient Hospital Stay: Payer: Commercial Managed Care - PPO | Attending: Physician Assistant

## 2022-08-16 DIAGNOSIS — D508 Other iron deficiency anemias: Secondary | ICD-10-CM

## 2022-08-16 DIAGNOSIS — D509 Iron deficiency anemia, unspecified: Secondary | ICD-10-CM | POA: Diagnosis not present

## 2022-08-16 DIAGNOSIS — E559 Vitamin D deficiency, unspecified: Secondary | ICD-10-CM | POA: Diagnosis not present

## 2022-08-16 DIAGNOSIS — R5383 Other fatigue: Secondary | ICD-10-CM

## 2022-08-16 LAB — FERRITIN: Ferritin: 210 ng/mL (ref 11–307)

## 2022-08-16 LAB — CBC WITH DIFFERENTIAL/PLATELET
Abs Immature Granulocytes: 0.02 10*3/uL (ref 0.00–0.07)
Basophils Absolute: 0 10*3/uL (ref 0.0–0.1)
Basophils Relative: 0 %
Eosinophils Absolute: 0.1 10*3/uL (ref 0.0–0.5)
Eosinophils Relative: 1 %
HCT: 39.8 % (ref 36.0–46.0)
Hemoglobin: 13 g/dL (ref 12.0–15.0)
Immature Granulocytes: 0 %
Lymphocytes Relative: 25 %
Lymphs Abs: 1.7 10*3/uL (ref 0.7–4.0)
MCH: 28.6 pg (ref 26.0–34.0)
MCHC: 32.7 g/dL (ref 30.0–36.0)
MCV: 87.5 fL (ref 80.0–100.0)
Monocytes Absolute: 0.3 10*3/uL (ref 0.1–1.0)
Monocytes Relative: 5 %
Neutro Abs: 4.7 10*3/uL (ref 1.7–7.7)
Neutrophils Relative %: 69 %
Platelets: 183 10*3/uL (ref 150–400)
RBC: 4.55 MIL/uL (ref 3.87–5.11)
RDW: 13.1 % (ref 11.5–15.5)
WBC: 6.9 10*3/uL (ref 4.0–10.5)
nRBC: 0 % (ref 0.0–0.2)

## 2022-08-16 LAB — IRON AND TIBC
Iron: 48 ug/dL (ref 28–170)
Saturation Ratios: 18 % (ref 10.4–31.8)
TIBC: 270 ug/dL (ref 250–450)
UIBC: 222 ug/dL

## 2022-08-16 LAB — VITAMIN B12: Vitamin B-12: 595 pg/mL (ref 180–914)

## 2022-08-16 LAB — VITAMIN D 25 HYDROXY (VIT D DEFICIENCY, FRACTURES): Vit D, 25-Hydroxy: 25.66 ng/mL — ABNORMAL LOW (ref 30–100)

## 2022-08-20 ENCOUNTER — Other Ambulatory Visit: Payer: Commercial Managed Care - PPO

## 2022-08-20 ENCOUNTER — Encounter: Payer: Self-pay | Admitting: *Deleted

## 2022-08-20 LAB — METHYLMALONIC ACID, SERUM: Methylmalonic Acid, Quantitative: 127 nmol/L (ref 0–378)

## 2022-08-21 NOTE — Progress Notes (Deleted)
RESCHEDULE (arrived late)

## 2022-08-22 ENCOUNTER — Encounter (HOSPITAL_COMMUNITY): Payer: Self-pay | Admitting: Emergency Medicine

## 2022-08-22 ENCOUNTER — Emergency Department (HOSPITAL_COMMUNITY)
Admission: EM | Admit: 2022-08-22 | Discharge: 2022-08-23 | Disposition: A | Discharge status: Home / Self Care | Payer: Worker's Compensation | Attending: Emergency Medicine | Admitting: Emergency Medicine

## 2022-08-22 ENCOUNTER — Ambulatory Visit: Payer: Commercial Managed Care - PPO | Admitting: Physician Assistant

## 2022-08-22 DIAGNOSIS — S060X0A Concussion without loss of consciousness, initial encounter: Secondary | ICD-10-CM | POA: Insufficient documentation

## 2022-08-22 DIAGNOSIS — J45909 Unspecified asthma, uncomplicated: Secondary | ICD-10-CM | POA: Insufficient documentation

## 2022-08-22 DIAGNOSIS — R519 Headache, unspecified: Secondary | ICD-10-CM | POA: Diagnosis not present

## 2022-08-22 DIAGNOSIS — S0990XA Unspecified injury of head, initial encounter: Secondary | ICD-10-CM | POA: Diagnosis present
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
## 2022-08-23 ENCOUNTER — Inpatient Hospital Stay: Payer: Commercial Managed Care - PPO | Admitting: Physician Assistant

## 2022-08-23 ENCOUNTER — Encounter (HOSPITAL_COMMUNITY): Payer: Self-pay | Admitting: Hematology

## 2022-08-23 ENCOUNTER — Emergency Department (HOSPITAL_COMMUNITY): Payer: Worker's Compensation

## 2022-08-23 MED ORDER — ONDANSETRON 4 MG PO TBDP
4.0000 mg | ORAL_TABLET | Freq: Once | ORAL | Status: AC
Start: 2022-08-23 — End: 2022-08-23
  Administered 2022-08-23: 4 mg via ORAL
  Filled 2022-08-23: qty 1

## 2022-08-23 MED ORDER — PROCHLORPERAZINE MALEATE 10 MG PO TABS: 10.0000 mg | ORAL_TABLET | Freq: Two times a day (BID) | ORAL | 0 refills | Status: DC | PRN

## 2022-08-23 MED ORDER — ACETAMINOPHEN 500 MG PO TABS
1000.0000 mg | ORAL_TABLET | Freq: Once | ORAL | Status: AC
  Administered 2022-08-23: 1000 mg via ORAL
  Filled 2022-08-23: qty 2

## 2022-08-23 NOTE — Discharge Instructions (Signed)
You were evaluated in the Emergency Department and after careful evaluation, we did not find any emergent condition requiring admission or further testing in the hospital.  Your exam/testing today is overall reassuring.  CT scan did not show any bleeding or emergencies.  Your symptoms are likely due to a concussion.  Recommend mental and physical rest for the next 2 days and then slow return to normal daily activities.  Recommend follow-up with primary care doctor if still having lingering symptoms after 2 days of rest.  Please return to the Emergency Department if you experience any worsening of your condition.   Thank you for allowing [place] to be a part of your care.
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
## 2022-08-27 NOTE — Progress Notes (Unsigned)
[facility] [address], [place] [address]   CLINIC:  Medical Oncology/Hematology  PCP:  Lawerance Sabal, [place] [address] [place] [address] [phone]   REASON FOR VISIT:  Follow-up for iron deficiency anemia  CURRENT THERAPY: Intermittent IV iron infusions  INTERVAL HISTORY:  Ms. Germer 27 y.o. female returns for routine follow-up of her iron deficiency anemia.  She was last seen by Rojelio Brenner PA-C on 02/16/2022.  At today's visit, she reports feeling poorly due to ongoing chronic fatigue and generalized pain.  She has headaches, dizziness, and palpitations.  No syncopal episodes.  She denies any epistaxis, hematemesis, hematochezia, or melena.  She reports that her periods are light secondary to using ParaGard IUD.  She is taking iron pill once daily. She takes multivitamin that contains vitamin D.   Additional symptoms further described in ROS, as they are not directly pertinent to today's visit.  She has little to no energy and  60% appetite. She has lost 20 pounds in the past 6 months secondary to stress and decreased appetite.   REVIEW OF SYSTEMS:  Review of Systems  Constitutional:  Positive for appetite change (appetite 60%) and fatigue. Negative for chills, diaphoresis, fever and unexpected weight change.  HENT:   Negative for lump/mass and nosebleeds.        Dry mouth  Eyes:  Negative for eye problems.  Respiratory:  Positive for shortness of breath (intermittent from asthma). Negative for cough and hemoptysis.   Cardiovascular:  Positive for palpitations. Negative for chest pain and leg swelling.  Gastrointestinal:  Positive for diarrhea (IBS) and nausea. Negative for abdominal pain, blood in stool, constipation and vomiting.  Genitourinary:  Negative for hematuria.   Musculoskeletal:        Generalized "skin pain"  Skin: Negative.   Neurological:  Positive for dizziness and headaches. Negative for light-headedness and numbness.   Hematological:  Does not bruise/bleed easily.  Psychiatric/Behavioral:  Positive for depression and sleep disturbance. The patient is nervous/anxious.       PAST MEDICAL/SURGICAL HISTORY:  Past Medical History:  Diagnosis Date   Allergy    Anxiety and depression    Asthma    Birth defect    Blood transfusion without reported diagnosis    Clotting disorder (HCC)    Complication of anesthesia    'I had stridor after my 1st D&C but with my 2nd one they gave me a breathing treatment before my surgery and I did ok".   Cyst of left kidney    frequent cysts to kidneys   Dysrhythmia    h/o SVT   Family history of adverse reaction to anesthesia    GERD (gastroesophageal reflux disease)    Headache    History of recurrent miscarriages    Hypoglycemia    Lupus anticoagulant positive    Neuropathic pain    Other fatigue 02/16/2022   Palpitations    Past Surgical History:  Procedure Laterality Date   CESAREAN SECTION     CHOLECYSTECTOMY N/A 07/23/2018   Procedure: LAPAROSCOPIC CHOLECYSTECTOMY;  Surgeon: Lucretia Roers, MD;  Location: [facility] ORS;  Service: General;  Laterality: N/A;   DILATION AND CURETTAGE OF UTERUS N/A 01/29/2017   Procedure: SUCTION DILATATION AND CURETTAGE;  Surgeon: Tilda Burrow, MD;  Location: [facility] ORS;  Service: Gynecology;  Laterality: N/A;   DILATION AND CURETTAGE OF UTERUS N/A 05/01/2017   Procedure: SUCTION DILATATION AND CURETTAGE;  Surgeon: Lazaro Arms, MD;  Location: [facility]  ORS;  Service: Gynecology;  Laterality: N/A;  pt to arrive at 7:15 to have labwork   FETOSCOPIC LASER PHOTOCOAGULATION  11/14/2017   of 34 placental vessels for TTTS   WISDOM TOOTH EXTRACTION       SOCIAL HISTORY:  Social History   Socioeconomic History   Marital status: Married    Spouse name: Not on file   Number of children: Not on file   Years of education: Not on file   Highest education level: Not on file  Occupational History   Occupation: Loss adjuster, chartered   Occupation:  Psychologist, sport and exercise  Tobacco Use   Smoking status: Never   Smokeless tobacco: Never  Vaping Use   Vaping Use: Never used  Substance and Sexual Activity   Alcohol use: No    Alcohol/week: 0.0 standard drinks of alcohol   Drug use: No   Sexual activity: Yes    Birth control/protection: Injection  Other Topics Concern   Not on file  Social History Narrative   Not on file   Social Determinants of Health   Financial Resource Strain: Not on file  Food Insecurity: Not on file  Transportation Needs: Not on file  Physical Activity: Not on file  Stress: Not on file  Social Connections: Not on file  Intimate Partner Violence: Not on file    FAMILY HISTORY:  Family History  Adopted: Yes  Problem Relation Age of Onset   Migraines Sister    Hypertension Maternal Grandmother    Skin cancer Maternal Grandmother    Heart disease Maternal Grandfather     CURRENT MEDICATIONS:  Outpatient Encounter Medications as of 08/28/2022  Medication Sig   albuterol (VENTOLIN HFA) 108 (90 Base) MCG/ACT inhaler albuterol sulfate HFA 90 mcg/actuation aerosol inhaler  INHALE 2 PUFFS BY MOUTH FOUR TIMES DAILY   dicyclomine (BENTYL) 10 MG capsule Take 10 mg by mouth every 6 (six) hours as needed.   gabapentin (NEURONTIN) 300 MG capsule Take 1 capsule (300 mg total) by mouth 2 (two) times daily.   HYDROcodone-acetaminophen (NORCO/VICODIN) 5-325 MG tablet Take 1 tablet by mouth 3 (three) times daily as needed.   hydrOXYzine (ATARAX) 25 MG tablet Take 25 mg by mouth daily.   Iron Combinations (IRON COMPLEX) CAPS Take by mouth.   lidocaine (LIDODERM) 5 % Place 1 patch onto the skin daily as needed (back pain). May wear up to 12 hours   methocarbamol (ROBAXIN) 500 MG tablet methocarbamol 500 mg tablet  TAKE 1 TABLET BY MOUTH TWICE A DAY AS NEEDED   omeprazole (PRILOSEC) 40 MG capsule Take 40 mg by mouth 2 (two) times daily.   ondansetron (ZOFRAN) 4 MG tablet Take by mouth.   paragard intrauterine copper IUD IUD  ParaGard T 380A 380 square mm intrauterine device   PARoxetine (PAXIL) 10 MG tablet Take 10 mg by mouth daily.   prochlorperazine (COMPAZINE) 10 MG tablet Take 1 tablet (10 mg total) by mouth 2 (two) times daily as needed (Nausea or headache).   SYMBICORT 80-4.5 MCG/ACT inhaler SMARTSIG:2 Inhalation By Mouth Twice Daily   Facility-Administered Encounter Medications as of 08/28/2022  Medication   0.9 %  sodium chloride infusion    ALLERGIES:  Allergies  Allergen Reactions   Pecan Extract Allergy Skin Test Rash and Swelling   Pecan Nut (Diagnostic) Hives   Reglan [Metoclopramide] Hives     PHYSICAL EXAM:  ECOG PERFORMANCE STATUS: 1 - Symptomatic but completely ambulatory  There were no vitals filed for this visit. There were no  vitals filed for this visit. Physical Exam Constitutional:      Appearance: Normal appearance.  HENT:     Head: Normocephalic and atraumatic.     Mouth/Throat:     Mouth: Mucous membranes are moist.  Eyes:     Extraocular Movements: Extraocular movements intact.     Pupils: Pupils are equal, round, and reactive to light.  Cardiovascular:     Rate and Rhythm: Normal rate and regular rhythm.     Pulses: Normal pulses.     Heart sounds: Normal heart sounds.  Pulmonary:     Effort: Pulmonary effort is normal.     Breath sounds: Normal breath sounds.  Abdominal:     General: Bowel sounds are normal.     Palpations: Abdomen is soft.     Tenderness: There is no abdominal tenderness.  Musculoskeletal:        General: No swelling.     Right lower leg: No edema.     Left lower leg: No edema.  Lymphadenopathy:     Cervical: No cervical adenopathy.  Skin:    General: Skin is warm and dry.  Neurological:     General: No focal deficit present.     Mental Status: She is alert and oriented to person, place, and time.  Psychiatric:        Mood and Affect: Mood is anxious. Affect is tearful.        Behavior: Behavior normal.      LABORATORY DATA:  I  have reviewed the labs as listed.  CBC    Component Value Date/Time   WBC 6.9 08/16/2022 1359   RBC 4.55 08/16/2022 1359   HGB 13.0 08/16/2022 1359   HGB 12.1 04/28/2018 1611   HCT 39.8 08/16/2022 1359   HCT 35.5 04/28/2018 1611   PLT 183 08/16/2022 1359   PLT 187 04/28/2018 1611   MCV 87.5 08/16/2022 1359   MCV 83 04/28/2018 1611   MCH 28.6 08/16/2022 1359   MCHC 32.7 08/16/2022 1359   RDW 13.1 08/16/2022 1359   RDW 14.0 04/28/2018 1611   LYMPHSABS 1.7 08/16/2022 1359   LYMPHSABS 1.4 04/28/2018 1611   MONOABS 0.3 08/16/2022 1359   EOSABS 0.1 08/16/2022 1359   EOSABS 0.1 04/28/2018 1611   BASOSABS 0.0 08/16/2022 1359   BASOSABS 0.0 04/28/2018 1611      Latest Ref Rng & Units 07/21/2021    9:11 AM 01/17/2021    8:03 AM 07/15/2020    8:42 AM  CMP  Glucose 70 - 99 mg/dL 948  546  270   BUN 6 - 20 mg/dL 14  11  13    Creatinine 0.44 - 1.00 mg/dL  3.50  0.93   Sodium 135 - 145 mmol/L 138  138  138   Potassium 3.5 - 5.1 mmol/L 4.2  4.1  4.6   Chloride 98 - 111 mmol/L 106  109  106   CO2 22 - 32 mmol/L 26  24  25    Calcium 8.9 - 10.3 mg/dL 9.3  9.0  9.0   Total Protein 6.5 - 8.1 g/dL 8.3  7.3  7.5   Total Bilirubin 0.3 - 1.2 mg/dL 1.5  1.2  1.5   Alkaline Phos 38 - 126 U/L 53  45  53   AST 15 - 41 U/L 22  17  17    ALT 0 - 44 U/L 24  18  22      DIAGNOSTIC IMAGING:  I have independently reviewed the relevant  imaging and discussed with the patient.   ASSESSMENT & PLAN: 1.  Iron deficiency anemia: - She denies menorrhagia/heavy periods.  She previously experienced heavy periods, but this is improved after she started ParaGard for birth control.  No bright red blood per rectum or melena. - Last iron infusion was with IV Venofer 200 mg x 2 doses (02/21/2022 and 03/07/2022), reports improved energy after IV iron  - She reports severe chronic fatigue - Most recent labs (08/16/2022): Hgb 13.0, ferritin 210, iron saturation 18%.  Normal B12 and MMA. - PLAN: No indication for IV  iron at this time. - RTC in 6 months for repeat labs and office visit   2.  Indirect hyperbilirubinemia: - She was found to have elevated total bilirubin on 04/01/2019 at 2.7. - LDH was mildly elevated.  It was predominantly indirect bilirubin.  Direct Coombs test was negative for IgG and complement. - DAT negative hemolytic anemia evaluation labs were also negative. - CT abdomen/pelvis (04/01/2019): Spleen 12 cm (upper limit of normal) - Abdominal ultrasound (12/09/2019): Normal liver and spleen - Differential diagnosis includes Gilbert's syndrome, and she was referred to gastroenterology - GI did upper endoscopy on 01/15/2020 with biopsies.  Mild chronic gastritis.  No other pathological findings; negative for celiac disease.  No source of bleeding.   3.  Vitamin D deficiency: - Labs done on 08/16/2022 show low vitamin D level at 25.66 - Patient reports she takes multivitamin that contains 800 units Vitamin D - PLAN: Continue daily multivitamin with addition of Vitamin D 1,000 units daily  4.  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 - Patient requested prescription for Lovenox - reports that she was previously found to have positive lupus anticoagulant test - Reports miscarriages x5 - She gave birth to premature twins (26 weeks) in 2018, but reports that she was only able to carry them to the age of viability while she was on Lovenox injections - She reports that she "felt better" while taking Lovenox - She has had increased fatigue and generalized skin pain and asks if this could be from lupus anticoagulant syndrome - Review of past labs shows: Antiphospholipid panel (06/28/2017) indeterminate for presence of lupus anticoagulant.  Only 1 phospholipid dependent assay showed evidence of positivity, with hexagonal phospholipid neutralization assay following just above normal reference interval.  This may have been falsely positive in the setting of pregnancy and Lovenox. Lupus  anticoagulant panel negative on 01/28/2019 Lupus anticoagulant panel negative on 06/19/2019 - PLAN: Discussed with patient that she does not meet criteria for lupus anticoagulant syndrome or antiphospholipid antibody syndrome.  Discussed with her that even if she did meet this criteria, Lovenox would not be indicated since she does not have any prior history of VTE. - Discussed with patient that her constellation of symptoms is not related to her history of indeterminate lupus anticoagulant test and recommended additional follow-up with PCP and other specialists as indicated.    PLAN SUMMARY & DISPOSITION: Labs and office visit in 6 months   All questions were answered. The patient knows to call the clinic with any problems, questions or concerns.  Medical decision making: Moderate   Time spent on visit: I spent 20 minutes counseling the patient face to face. The total time spent in the appointment was 30 minutes and more than 50% was on counseling.   Carnella Guadalajara, PA-C  08/28/2022 12:01 PM

## 2022-08-28 ENCOUNTER — Inpatient Hospital Stay (HOSPITAL_BASED_OUTPATIENT_CLINIC_OR_DEPARTMENT_OTHER): Payer: Commercial Managed Care - PPO | Admitting: Physician Assistant

## 2022-08-28 VITALS — BP 112/83 | HR 101 | Temp 97.0°F | Resp 19 | Ht 65.0 in | Wt 127.3 lb

## 2022-08-28 DIAGNOSIS — R5383 Other fatigue: Secondary | ICD-10-CM | POA: Diagnosis not present

## 2022-08-28 DIAGNOSIS — D509 Iron deficiency anemia, unspecified: Secondary | ICD-10-CM | POA: Diagnosis not present

## 2022-08-28 DIAGNOSIS — D508 Other iron deficiency anemias: Secondary | ICD-10-CM

## 2022-08-28 NOTE — Patient Instructions (Signed)
[facility] **VISIT SUMMARY & IMPORTANT INSTRUCTIONS **   You were seen today by Rojelio Brenner PA-C for your iron deficiency anemia.    IRON DEFICIENCY Your blood and iron levels look great at this time. Continue to take iron tablets once daily.  VITAMIN D DEFICIENCY Your vitamin D levels remain slightly low at 25.66 Continue to take your multivitamin once daily but you should ALSO TAKE VITAMIN D 25 mcg (1000 units) DAILY.  HISTORY OF POSSIBLE LUPUS ANTICOAGULANT POSITIVITY Review of lab results and clinical history does not support diagnosis of lupus anticoagulant syndrome at this time. There is no clinical indication for anticoagulant or Lovenox. Let us know immediately if you have any blood clots (DVT or PE) in the future, or if you are seeking to become pregnant.  FOLLOW-UP APPOINTMENT: Labs in 6 months with office visit the following week  ** Thank you for trusting me with your healthcare!  I strive to provide all of my patients with quality care at each visit.  If you receive a survey for this visit, I would be so grateful to you for taking the time to provide feedback.  Thank you in advance!  ~ Genette Huertas                   Dr. Doreatha Massed   &   Rojelio Brenner, PA-C   - - - - - - - - - - - - - - - - - -    Thank you for choosing [facility] at [facility] to provide your oncology and hematology care.  To afford each patient quality time with our provider, please arrive at least 15 minutes before your scheduled appointment time.   If you have a lab appointment with the Cancer Center please come in thru the Main Entrance and check in at the main information desk.  You need to re-schedule your appointment should you arrive 10 or more minutes late.  We strive to give you quality time with our providers, and arriving late affects you and other patients whose appointments are after yours.  Also, if you no show  three or more times for appointments you may be dismissed from the clinic at the providers discretion.     Again, thank you for choosing [facility].  Our hope is that these requests will decrease the amount of time that you wait before being seen by our physicians.       _____________________________________________________________  Should you have questions after your visit to [facility], please contact our office at [phone] and follow the prompts.  Our office hours are 8:00 a.m. and 4:30 p.m. Monday - Friday.  Please note that voicemails left after 4:00 p.m. may not be returned until the following business day.  We are closed weekends and major holidays.  You do have access to a nurse 24-7, just call the main number to the clinic [phone] and do not press any options, hold on the line and a nurse will answer the phone.    For prescription refill requests, have your pharmacy contact our office and allow 72 hours.

## 2022-09-24 ENCOUNTER — Encounter (HOSPITAL_COMMUNITY): Payer: Self-pay | Admitting: Hematology

## 2022-11-19 NOTE — Progress Notes (Signed)
Office Visit Note  Patient: Carol Bernard             Date of Birth: 1995-05-27           MRN: [id number]             PCP: Lawerance Sabal, PA Referring: Lawerance Sabal, [place] Visit Date: 11/20/2022 Occupation: RT school  Subjective:  New Patient (Initial Visit) (Feels like skin is ripping off)   History of Present Illness: Carol Bernard is a 27 y.o. female here for evaluation for questionable antiphospholipid antibody syndrome.  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  She had an isolated abnormal lupus anticoagulant test but was on Lovenox anticoagulation at the time as well.  After this she is continue to have problems with episodes of severe pain that feels very superficially located sensation like her skin is ripping off or tearing despite no visible changes.  She did experience some peripheral neuropathy symptoms during pregnancy that was treated with gabapentin although these improved after delivery.  Since her problems have been ongoing for years typically experiences episodes 1 day she might have severe pain in a particular area and under the days is pretty much pain-free.  The only area that is chronically affected is low back pain for which she sees Dr. Ethelene Hal has been treated with local injections, muscle relaxants, and low-dose pain medications intermittently.  She does have spinal scoliosis.  No previous history of serious trauma or joint surgeries. She denies any complaints of oral nasal ulcers, lymphadenopathy, skin rashes, Raynaud's, abnormal bruising or bleeding. She has a history of iron deficiency anemia also fatigue and low vitamin B12 on replacement.  She has ADHD doing well on medication.  Labs reviewed 12/2018 ANA neg RF neg  Activities of Daily Living:  Patient reports morning stiffness for 0  none .    Patient Denies nocturnal pain.  Difficulty dressing/grooming: Denies Difficulty climbing stairs: Denies Difficulty getting out of chair: Denies Difficulty using hands for taps, buttons, cutlery, and/or writing: Denies  Review of Systems  Constitutional:  Positive for fatigue.  HENT:  Positive for mouth dryness. Negative for mouth sores.   Eyes:  Negative for dryness.  Respiratory:  Positive for shortness of breath.   Cardiovascular:  Positive for chest pain and palpitations.  Gastrointestinal:  Positive for nausea. Negative for blood in stool, constipation and diarrhea.  Endocrine: Positive for increased urination.  Genitourinary:  Negative for involuntary urination.  Musculoskeletal:  Positive for joint pain, joint pain, myalgias, muscle weakness, muscle tenderness and myalgias. Negative for gait problem, joint swelling and morning stiffness.  Skin:  Positive for color change and hair loss. Negative for rash and sensitivity to sunlight.  Allergic/Immunologic: Negative for susceptible to infections.  Neurological:  Positive for dizziness and headaches.  Hematological:  Negative for swollen glands.  Psychiatric/Behavioral:  Positive for sleep disturbance. Negative for depressed mood. The patient is nervous/anxious.     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   Cyst of left kidney    frequent cysts to kidneys   Dysrhythmia    h/o SVT   Family history of adverse reaction to anesthesia    GERD (gastroesophageal reflux disease)    Headache    History of recurrent miscarriages    Hypoglycemia    Iron deficiency    Lupus anticoagulant positive    Neuropathic pain    Other fatigue 02/16/2022   Palpitations     Family History  Adopted: Yes  Problem Relation Age of Onset   Migraines Sister    Hypertension Maternal Grandmother    Skin cancer Maternal Grandmother    Heart disease Maternal Grandfather    Past Surgical History:  Procedure Laterality Date   ABLATION     CESAREAN SECTION     CHOLECYSTECTOMY N/A 07/23/2018   Procedure: LAPAROSCOPIC CHOLECYSTECTOMY;  Surgeon: Virl Cagey, MD;  Location: [facility];  Service: General;  Laterality: N/A;   DILATION AND CURETTAGE OF UTERUS N/A 01/29/2017   Procedure: SUCTION DILATATION AND CURETTAGE;  Surgeon: Jonnie Kind, MD;  Location: [facility];  Service: Gynecology;  Laterality: N/A;   DILATION AND CURETTAGE OF UTERUS N/A 05/01/2017   Procedure: SUCTION DILATATION AND CURETTAGE;  Surgeon: Florian Buff, MD;  Location: [facility];  Service: Gynecology;  Laterality: N/A;  pt to arrive at 7:15 to have labwork   FETOSCOPIC LASER PHOTOCOAGULATION  11/14/2017   of 34 placental vessels for TTTS   WISDOM TOOTH EXTRACTION     Social History   Social History Narrative   Not on file   Immunization History  Administered Date(s) Administered   Influenza Inj Mdck Quad Pf 12/31/2017   Influenza-Unspecified 10/07/2018, 10/09/2019   Tdap 12/29/2017      Objective: Vital Signs: BP 105/69 (BP Location: Right Arm, Patient Position: Sitting, Cuff Size: Normal)   Pulse 72   Resp 14   Ht 5\' 4"  (1.626 m)   Wt 126 lb (57.2 kg)   BMI 21.63 kg/m    Physical Exam HENT:     Mouth/Throat:     Mouth: Mucous membranes are moist.     Pharynx: Oropharynx is clear.  Eyes:     Conjunctiva/sclera: Conjunctivae normal.  Cardiovascular:     Rate and Rhythm: Normal rate and regular rhythm.  Pulmonary:     Effort: Pulmonary effort is normal.     Breath sounds: Normal breath sounds.  Musculoskeletal:     Right lower leg: No edema.     Left lower leg: No edema.  Lymphadenopathy:     Cervical: No cervical adenopathy.  Skin:    General: Skin is warm and dry.     Findings: No rash.  Neurological:     Mental Status: She is alert.  Psychiatric:        Mood and Affect: Mood normal.  Musculoskeletal Exam:  Neck full ROM no tenderness Shoulders full ROM no tenderness or swelling Elbows full ROM no tenderness or swelling Wrists full ROM no tenderness or swelling Fingers full ROM no tenderness or swelling Scoliosis present, mild paraspinal tenderness to pressure not much radiation Knees full ROM no tenderness or swelling Ankles full ROM no tenderness or swelling MTPs full ROM no tenderness or swelling  Investigation: No additional findings.  Imaging: No results found.  Recent Labs: Lab Results  Component Value Date   WBC 6.9 08/16/2022   HGB 13.0 08/16/2022   PLT 183 08/16/2022   NA 138 07/21/2021   K 4.2 07/21/2021   CL 106 07/21/2021   CO2 26 07/21/2021   GLUCOSE 114 (H) 07/21/2021   BUN 14 07/21/2021   CREATININE 0.61 07/21/2021   BILITOT 1.5 (H) 07/21/2021   ALKPHOS 53 07/21/2021   AST 22 07/21/2021   ALT 24 07/21/2021   PROT 8.3 (H) 07/21/2021   ALBUMIN 4.9 07/21/2021   CALCIUM 9.3 07/21/2021   GFRAA >60 07/15/2020    Speciality Comments: No specialty comments available.  Procedures:  No procedures  performed Allergies: Pecan extract allergy skin test, Pecan nut (diagnostic), and Reglan [metoclopramide]   Assessment / Plan:     Visit Diagnoses: Skin pain - Plan: ANA, Anti-Smith antibody, Anti-DNA antibody, double-stranded, C3 and C4, C-reactive protein, Sedimentation rate, Sjogrens syndrome-A extractable nuclear antibody  Episodic body pains seem pretty severe and superficial but localized to the region at a time without widespread pain.  She has had some previous abnormal test results and family history for autoimmune disease.  Will recheck ANA also screening directly with dsDNA [facility] and serum complements ruling out any evidence for lupus.  Will also recheck sedimentation rate and CRP for evidence of systemic inflammation.  Symptoms are not typical for fibromyalgia syndrome as she does not have some of the usual associated symptoms and does not have widespread pain or allodynia.  My other suspicion would be for small fiber neuropathy problem.  She had a reported history of peripheral neuropathy during pregnancy.  Lupus anticoagulant disorder (HCC) - Plan: Beta-2 glycoprotein antibodies, Cardiolipin antibodies, IgG, IgM, IgA, Lupus Anticoagulant Eval w/Reflex  Questionable history for possible obstetric APS though does not meet criteria with no positive repeat confirmatory testing.  Last checked in 2020 we will just repeat at this time at baseline and off any therapy.  Overall benign assessment with hematology clinic as well as so low pretest disease suspicion.  Neuropathy  Suspect neuropathic cause of her pain symptoms may be small fiber neuropathy.  Discussed this is a difficult diagnosis to confirm sometimes identified by skin biopsy but does not have great sensitivity.  If initial workup as above is unrevealing recommend trial of Lyrica.  Also discussed referral to neurology for evaluation she prefers holding off at this time.  Orders: Orders Placed This Encounter  Procedures   ANA    Anti-Smith antibody   Anti-DNA antibody, double-stranded   C3 and C4   Beta-2 glycoprotein antibodies   Cardiolipin antibodies, IgG, IgM, IgA   Lupus Anticoagulant Eval w/Reflex   C-reactive protein   Sedimentation rate   Sjogrens syndrome-A extractable nuclear antibody   No orders of the defined types were placed in this encounter.    Follow-Up Instructions: Return in about 2 months (around 01/20/2023) for New pt ?SFN/FMS f/u 16mos.   Collier Salina, MD  Note - This record has been created using [employer].  Chart creation errors  have been sought, but may not always  have been located. Such creation errors do not reflect on  the standard of medical care.

## 2022-11-20 ENCOUNTER — Encounter: Payer: Self-pay | Admitting: Internal Medicine

## 2022-11-20 ENCOUNTER — Ambulatory Visit: Payer: Commercial Managed Care - PPO | Attending: Internal Medicine | Admitting: Internal Medicine

## 2022-11-20 VITALS — BP 105/69 | HR 72 | Resp 14 | Ht 64.0 in | Wt 126.0 lb

## 2022-11-20 DIAGNOSIS — G629 Polyneuropathy, unspecified: Secondary | ICD-10-CM | POA: Diagnosis not present

## 2022-11-20 DIAGNOSIS — R208 Other disturbances of skin sensation: Secondary | ICD-10-CM | POA: Diagnosis not present

## 2022-11-20 DIAGNOSIS — D6862 Lupus anticoagulant syndrome: Secondary | ICD-10-CM | POA: Diagnosis not present

## 2022-11-26 LAB — BETA-2 GLYCOPROTEIN ANTIBODIES
Beta-2 Glyco 1 IgA: <2 U/mL (ref <20.0)
Beta-2 Glyco 1 IgM: <2 U/mL (ref <20.0)
Beta-2 Glyco I IgG: <2 U/mL (ref <20.0)

## 2022-11-26 LAB — SEDIMENTATION RATE: Sed Rate: 2 mm/h (ref 0–20)

## 2022-11-26 LAB — C-REACTIVE PROTEIN: CRP: 0.3 mg/L (ref <8.0)

## 2022-11-26 LAB — ANTI-DNA ANTIBODY, DOUBLE-STRANDED: ds DNA Ab: 2 IU/mL

## 2022-11-26 LAB — ANTI-SMITH ANTIBODY: ENA SM Ab Ser-aCnc: <1 AI

## 2022-11-26 LAB — C3 AND C4
C3 Complement: 98 mg/dL (ref 83–193)
C4 Complement: 22 mg/dL (ref 15–57)

## 2022-11-26 LAB — SJOGRENS SYNDROME-A EXTRACTABLE NUCLEAR ANTIBODY: SSA (Ro) (ENA) Antibody, IgG: <1 AI

## 2022-11-26 LAB — CARDIOLIPIN ANTIBODIES, IGG, IGM, IGA
Anticardiolipin IgA: <2 APL-U/mL (ref <20.0)
Anticardiolipin IgG: <2 GPL-U/mL (ref <20.0)
Anticardiolipin IgM: <2 MPL-U/mL (ref <20.0)

## 2022-11-26 LAB — LUPUS ANTICOAGULANT EVAL W/ REFLEX
PTT-LA Screen: 34 s (ref <=40)
dRVVT: 29 s (ref <=45)

## 2022-11-26 LAB — ANA: Anti Nuclear Antibody (ANA): NEGATIVE

## 2023-01-18 ENCOUNTER — Ambulatory Visit: Payer: Commercial Managed Care - PPO | Admitting: Internal Medicine

## 2023-02-08 ENCOUNTER — Encounter (HOSPITAL_COMMUNITY): Payer: Self-pay | Admitting: Hematology

## 2023-02-15 ENCOUNTER — Other Ambulatory Visit: Payer: Commercial Managed Care - PPO

## 2023-02-15 ENCOUNTER — Inpatient Hospital Stay: Payer: Commercial Managed Care - PPO | Attending: Hematology

## 2023-02-15 DIAGNOSIS — E559 Vitamin D deficiency, unspecified: Secondary | ICD-10-CM | POA: Diagnosis not present

## 2023-02-15 DIAGNOSIS — R5383 Other fatigue: Secondary | ICD-10-CM

## 2023-02-15 DIAGNOSIS — D509 Iron deficiency anemia, unspecified: Secondary | ICD-10-CM | POA: Diagnosis present

## 2023-02-15 DIAGNOSIS — E538 Deficiency of other specified B group vitamins: Secondary | ICD-10-CM | POA: Diagnosis not present

## 2023-02-15 DIAGNOSIS — Z808 Family history of malignant neoplasm of other organs or systems: Secondary | ICD-10-CM | POA: Diagnosis not present

## 2023-02-15 DIAGNOSIS — D508 Other iron deficiency anemias: Secondary | ICD-10-CM

## 2023-02-15 LAB — CBC WITH DIFFERENTIAL/PLATELET
Abs Immature Granulocytes: 0.01 10*3/uL (ref 0.00–0.07)
Basophils Absolute: 0 10*3/uL (ref 0.0–0.1)
Basophils Relative: 1 %
Eosinophils Absolute: 0.1 10*3/uL (ref 0.0–0.5)
Eosinophils Relative: 2 %
HCT: 39.6 % (ref 36.0–46.0)
Hemoglobin: 13.1 g/dL (ref 12.0–15.0)
Immature Granulocytes: 0 %
Lymphocytes Relative: 25 %
Lymphs Abs: 1.1 10*3/uL (ref 0.7–4.0)
MCH: 29.2 pg (ref 26.0–34.0)
MCHC: 33.1 g/dL (ref 30.0–36.0)
MCV: 88.4 fL (ref 80.0–100.0)
Monocytes Absolute: 0.2 10*3/uL (ref 0.1–1.0)
Monocytes Relative: 5 %
Neutro Abs: 3.1 10*3/uL (ref 1.7–7.7)
Neutrophils Relative %: 67 %
Platelets: 168 10*3/uL (ref 150–400)
RBC: 4.48 MIL/uL (ref 3.87–5.11)
RDW: 12.7 % (ref 11.5–15.5)
WBC: 4.5 10*3/uL (ref 4.0–10.5)
nRBC: 0 % (ref 0.0–0.2)

## 2023-02-15 LAB — COMPREHENSIVE METABOLIC PANEL
ALT: 13 U/L (ref 0–44)
AST: 16 U/L (ref 15–41)
Albumin: 4.5 g/dL (ref 3.5–5.0)
Alkaline Phosphatase: 36 U/L — ABNORMAL LOW (ref 38–126)
Anion gap: 7 (ref 5–15)
BUN: 18 mg/dL (ref 6–20)
CO2: 25 mmol/L (ref 22–32)
Calcium: 8.8 mg/dL — ABNORMAL LOW (ref 8.9–10.3)
Chloride: 105 mmol/L (ref 98–111)
Creatinine, Ser: 0.65 mg/dL (ref 0.44–1.00)
GFR, Estimated: >60 mL/min (ref >60)
Glucose, Bld: 90 mg/dL (ref 70–99)
Potassium: 4.2 mmol/L (ref 3.5–5.1)
Sodium: 137 mmol/L (ref 135–145)
Total Bilirubin: 1.8 mg/dL — ABNORMAL HIGH (ref 0.3–1.2)
Total Protein: 7.3 g/dL (ref 6.5–8.1)

## 2023-02-15 LAB — FOLATE: Folate: 3.2 ng/mL — ABNORMAL LOW (ref >5.9)

## 2023-02-15 LAB — IRON AND TIBC
Iron: 102 ug/dL (ref 28–170)
Saturation Ratios: 37 % — ABNORMAL HIGH (ref 10.4–31.8)
TIBC: 276 ug/dL (ref 250–450)
UIBC: 174 ug/dL

## 2023-02-15 LAB — FERRITIN: Ferritin: 145 ng/mL (ref 11–307)

## 2023-02-15 LAB — VITAMIN B12: Vitamin B-12: 499 pg/mL (ref 180–914)

## 2023-02-16 LAB — MISC LABCORP TEST (SEND OUT): Labcorp test code: 81950

## 2023-02-21 ENCOUNTER — Other Ambulatory Visit: Payer: Commercial Managed Care - PPO

## 2023-02-22 ENCOUNTER — Ambulatory Visit: Payer: Commercial Managed Care - PPO | Admitting: Physician Assistant

## 2023-02-22 LAB — METHYLMALONIC ACID, SERUM: Methylmalonic Acid, Quantitative: 179 nmol/L (ref 0–378)
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
## 2023-02-25 ENCOUNTER — Other Ambulatory Visit: Payer: Self-pay

## 2023-02-25 ENCOUNTER — Inpatient Hospital Stay (HOSPITAL_BASED_OUTPATIENT_CLINIC_OR_DEPARTMENT_OTHER): Payer: Commercial Managed Care - PPO | Admitting: Physician Assistant

## 2023-02-25 VITALS — BP 103/71 | HR 84 | Temp 98.4°F | Resp 18 | Ht 64.0 in | Wt 126.9 lb

## 2023-02-25 DIAGNOSIS — E538 Deficiency of other specified B group vitamins: Secondary | ICD-10-CM | POA: Diagnosis not present

## 2023-02-25 DIAGNOSIS — E559 Vitamin D deficiency, unspecified: Secondary | ICD-10-CM

## 2023-02-25 DIAGNOSIS — D509 Iron deficiency anemia, unspecified: Secondary | ICD-10-CM | POA: Diagnosis not present

## 2023-02-25 DIAGNOSIS — D508 Other iron deficiency anemias: Secondary | ICD-10-CM

## 2023-02-25 MED ORDER — FOLIC ACID 1 MG PO TABS: 1.0000 mg | ORAL_TABLET | Freq: Every day | ORAL | 3 refills | Status: AC

## 2023-02-25 NOTE — Patient Instructions (Signed)
[facility] [place] **   You were seen today by Tarri Abernethy PA-C for your iron deficiency anemia.    VITAMIN & MINERAL DEFICIENCY Your blood and iron levels look great at this time - Continue to take iron tablets once daily. Your vitamin D levels have significantly improved! Continue to take your multivitamin once daily, and decrease your extra VITAMIN D 25 mcg (1000 units) to EVERY OTHER DAY. Your vitamin B12 levels look great.  Continue to take daily vitamin B12 supplement. Your folate levels are low.  You will need to start taking folic acid 1 mg every day.  This is available over-the-counter.  HISTORY OF POSSIBLE LUPUS ANTICOAGULANT POSITIVITY Review of lab results and clinical history does not support diagnosis of lupus anticoagulant syndrome at this time. There is no clinical indication for anticoagulant or Lovenox. Let us know immediately if you have any blood clots (DVT or PE) in the future, or if you are seeking to become pregnant.  FOLLOW-UP APPOINTMENT: Labs in 6 months with office visit the following week  ** Thank you for trusting me with your healthcare!  I strive to provide all of my patients with quality care at each visit.  If you receive a survey for this visit, I would be so grateful to you for taking the time to provide feedback.  Thank you in advance!  ~ Bracen Schum                   Dr. Derek Jack   &   Tarri Abernethy, PA-C   - - - - - - - - - - - - - - - - - -    Thank you for choosing [facility] at [facility] to provide your oncology and hematology care.  To afford each patient quality time with our provider, please arrive at least 15 minutes before your scheduled appointment time.   If you have a lab appointment with the [facility] please come in thru the Main Entrance and check in at the main information desk.  You need to re-schedule your appointment  should you arrive 10 or more minutes late.  We strive to give you quality time with our providers, and arriving late affects you and other patients whose appointments are after yours.  Also, if you no show three or more times for appointments you may be dismissed from the clinic at the providers discretion.     Again, thank you for choosing [facility].  Our hope is that these requests will decrease the amount of time that you wait before being seen by our physicians.       _____________________________________________________________  Should you have questions after your visit to [facility], please contact our office at [phone] and follow the prompts.  Our office hours are 8:00 a.m. and 4:30 p.m. Monday - Friday.  Please note that voicemails left after 4:00 p.m. may not be returned until the following business day.  We are closed weekends and major holidays.  You do have access to a nurse 24-7, just call the main number to the clinic [phone] and do not press any options, hold on the line and a nurse will answer the phone.    For prescription refill requests, have your pharmacy contact our office and allow 72 hours.

## 2023-02-28 ENCOUNTER — Ambulatory Visit: Payer: Commercial Managed Care - PPO | Admitting: Physician Assistant

## 2023-03-25 ENCOUNTER — Other Ambulatory Visit: Payer: Commercial Managed Care - PPO | Admitting: Women's Health

## 2023-04-08 ENCOUNTER — Other Ambulatory Visit (HOSPITAL_COMMUNITY)
Admission: RE | Admit: 2023-04-08 | Discharge: 2023-04-08 | Disposition: A | Discharge status: Home / Self Care | Payer: Commercial Managed Care - PPO | Source: Ambulatory Visit | Attending: Obstetrics & Gynecology | Admitting: Obstetrics & Gynecology

## 2023-04-08 ENCOUNTER — Ambulatory Visit (INDEPENDENT_AMBULATORY_CARE_PROVIDER_SITE_OTHER): Payer: Commercial Managed Care - PPO | Admitting: Obstetrics & Gynecology

## 2023-04-08 ENCOUNTER — Encounter: Payer: Self-pay | Admitting: Obstetrics & Gynecology

## 2023-04-08 VITALS — BP 97/68 | HR 68 | Ht 60.0 in | Wt 127.0 lb

## 2023-04-08 DIAGNOSIS — Z01419 Encounter for gynecological examination (general) (routine) without abnormal findings: Secondary | ICD-10-CM | POA: Insufficient documentation

## 2023-04-08 NOTE — Progress Notes (Signed)
Subjective:     Carol Bernard is a 28 y.o. female here for a routine exam.  No LMP recorded. (Menstrual status: IUD). [id number] Birth Control Method:  Mirena IUD 2021 Menstrual Calendar(currently): regular  Current complaints: none.   Current acute medical issues:  none   Recent Gynecologic History No LMP recorded. (Menstrual status: IUD). Last Pap: 2019,  normal Last mammogram: na,    Past Medical History:  Diagnosis Date   Allergy    Anemia    Anxiety and depression    Asthma    Birth defect    Blood transfusion without reported diagnosis    Clotting disorder    Complication of anesthesia    'I had stridor after my 1st D&C but with my 2nd one they gave me a breathing treatment before my surgery and I did ok".   Cyst of left kidney    frequent cysts to kidneys   Dysrhythmia    h/o SVT   Family history of adverse reaction to anesthesia    GERD (gastroesophageal reflux disease)    Headache    History of recurrent miscarriages    Hypoglycemia    Iron deficiency    Lupus anticoagulant positive    Neuropathic pain    Other fatigue 02/16/2022   Palpitations     Past Surgical History:  Procedure Laterality Date   ABLATION     CESAREAN SECTION     CHOLECYSTECTOMY N/A 07/23/2018   Procedure: LAPAROSCOPIC CHOLECYSTECTOMY;  Surgeon: Lucretia Roers, MD;  Location: [facility];  Service: General;  Laterality: N/A;   DILATION AND CURETTAGE OF UTERUS N/A 01/29/2017   Procedure: SUCTION DILATATION AND CURETTAGE;  Surgeon: Tilda Burrow, MD;  Location: [facility];  Service: Gynecology;  Laterality: N/A;   DILATION AND CURETTAGE OF UTERUS N/A 05/01/2017   Procedure: SUCTION DILATATION AND CURETTAGE;  Surgeon: Lazaro Arms, MD;  Location: [facility];  Service: Gynecology;  Laterality: N/A;  pt to arrive at 7:15 to have labwork   FETOSCOPIC LASER PHOTOCOAGULATION  11/14/2017   of 34 placental vessels for TTTS   WISDOM TOOTH EXTRACTION      OB History     Gravida  7   Para  1    Term      Preterm  1   AB  5   Living  2      SAB  4   IAB      Ectopic      Multiple  1   Live Births  2           Social History   Socioeconomic History   Marital status: Married    Spouse name: Not on file   Number of children: Not on file   Years of education: Not on file   Highest education level: Not on file  Occupational History   Occupation: Loss adjuster, chartered   Occupation: Psychologist, sport and exercise  Tobacco Use   Smoking status: Never    Passive exposure: Never   Smokeless tobacco: Never  Vaping Use   Vaping Use: Never used  Substance and Sexual Activity   Alcohol use: No    Alcohol/week: 0.0 standard drinks of alcohol   Drug use: No   Sexual activity: Yes    Birth control/protection: I.U.D.  Other Topics Concern   Not on file  Social History Narrative   Not on file   Social Determinants of Health   Financial Resource Strain: Low Risk  (04/08/2023)   Overall  Financial Resource Strain (CARDIA)    Difficulty of Paying Living Expenses: Not very hard  Food Insecurity: No Food Insecurity (04/08/2023)   Hunger Vital Sign    Worried About Running Out of Food in the Last Year: Never true    Ran Out of Food in the Last Year: Never true  Transportation Needs: No Transportation Needs (04/08/2023)   PRAPARE - Administrator, Civil ServiceTransportation    Lack of Transportation (Medical): No    Lack of Transportation (Non-Medical): No  Physical Activity: Insufficiently Active (04/08/2023)   Exercise Vital Sign    Days of Exercise per Week: 7 days    Minutes of Exercise per Session: 20 min  Stress: Stress Concern Present (04/08/2023)   [employer] - Occupational Stress Questionnaire    Feeling of Stress : To some extent  Social Connections: Moderately Integrated (04/08/2023)   Social Connection and Isolation Panel [NHANES]    Frequency of Communication with Friends and Family: Three times a week    Frequency of Social Gatherings with Friends and Family: Twice a week     Attends Religious Services: More than 4 times per year    Active Member of [employer]ubs or Organizations: No    Attends Engineer, structuralClub or Organization Meetings: Never    Marital Status: Married    Family History  Adopted: Yes  Problem Relation Age of Onset   Hypertension Maternal Grandmother    Skin cancer Maternal Grandmother    Heart disease Maternal Grandfather    Hypertension Mother    Migraines Sister      Current Outpatient Medications:    albuterol (VENTOLIN HFA) 108 (90 Base) MCG/ACT inhaler, albuterol sulfate HFA 90 mcg/actuation aerosol inhaler  INHALE 2 PUFFS BY MOUTH FOUR TIMES DAILY, Disp: , Rfl:    amphetamine-dextroamphetamine (ADDERALL XR) 25 MG 24 hr capsule, Take by mouth every morning., Disp: , Rfl:    folic acid (FOLVITE) 1 MG tablet, Take 1 tablet (1 mg total) by mouth daily., Disp: 90 tablet, Rfl: 3   gabapentin (NEURONTIN) 300 MG capsule, Take 1 capsule (300 mg total) by mouth 2 (two) times daily. (Patient taking differently: Take 100 mg by mouth daily.), Disp: 60 capsule, Rfl: 11   Iron Combinations (IRON COMPLEX) CAPS, Take by mouth., Disp: , Rfl:    lidocaine (LIDODERM) 5 %, Place 1 patch onto the skin daily as needed (back pain). May wear up to 12 hours, Disp: , Rfl:    methocarbamol (ROBAXIN) 500 MG tablet, methocarbamol 500 mg tablet  TAKE 1 TABLET BY MOUTH TWICE A DAY AS NEEDED, Disp: , Rfl:    omeprazole (PRILOSEC) 40 MG capsule, Take 40 mg by mouth 2 (two) times daily., Disp: , Rfl:    paragard intrauterine copper IUD IUD, ParaGard T 380A 380 square mm intrauterine device, Disp: , Rfl:    SYMBICORT 80-4.5 MCG/ACT inhaler, SMARTSIG:2 Inhalation By Mouth Twice Daily, Disp: , Rfl:    vitamin B-12 (CYANOCOBALAMIN) 50 MCG tablet, Take 50 mcg by mouth daily., Disp: , Rfl:    Vitamin D, Ergocalciferol, (DRISDOL) 1.25 MG (50000 UNIT) CAPS capsule, Take 50,000 Units by mouth every 7 (seven) days., Disp: , Rfl:    dicyclomine (BENTYL) 10 MG capsule, Take 10 mg by mouth every 6  (six) hours as needed. (Patient not taking: Reported on 11/20/2022), Disp: , Rfl:    HYDROcodone-acetaminophen (NORCO/VICODIN) 5-325 MG tablet, Take 1 tablet by mouth 3 (three) times daily as needed. (Patient not taking: Reported on 04/08/2023), Disp: , Rfl:  ondansetron (ZOFRAN) 8 MG tablet, Take by mouth. (Patient not taking: Reported on 04/08/2023), Disp: , Rfl:   Current Facility-Administered Medications:    0.9 %  sodium chloride infusion, 500 mL, Intravenous, Once, Tressia Danas, MD  Review of Systems  Review of Systems  Constitutional: Negative for fever, chills, weight loss, malaise/fatigue and diaphoresis.  HENT: Negative for hearing loss, ear pain, nosebleeds, congestion, sore throat, neck pain, tinnitus and ear discharge.   Eyes: Negative for blurred vision, double vision, photophobia, pain, discharge and redness.  Respiratory: Negative for cough, hemoptysis, sputum production, shortness of breath, wheezing and stridor.   Cardiovascular: Negative for chest pain, palpitations, orthopnea, claudication, leg swelling and PND.  Gastrointestinal: negative for abdominal pain. Negative for heartburn, nausea, vomiting, diarrhea, constipation, blood in stool and melena.  Genitourinary: Negative for dysuria, urgency, frequency, hematuria and flank pain.  Musculoskeletal: Negative for myalgias, back pain, joint pain and falls.  Skin: Negative for itching and rash.  Neurological: Negative for dizziness, tingling, tremors, sensory change, speech change, focal weakness, seizures, loss of consciousness, weakness and headaches.  Endo/Heme/Allergies: Negative for environmental allergies and polydipsia. Does not bruise/bleed easily.  Psychiatric/Behavioral: Negative for depression, suicidal ideas, hallucinations, memory loss and substance abuse. The patient is not nervous/anxious and does not have insomnia.        Objective:  Blood pressure 97/68, pulse 68, height 5' (1.524 m), weight 127 lb  (57.6 kg).   Physical Exam  Vitals reviewed. Constitutional: She is oriented to person, place, and time. She appears well-developed and well-nourished.  HENT:  Head: Normocephalic and atraumatic.        Right Ear: External ear normal.  Left Ear: External ear normal.  Nose: Nose normal.  Mouth/Throat: Oropharynx is clear and moist.  Eyes: Conjunctivae and EOM are normal. Pupils are equal, round, and reactive to light. Right eye exhibits no discharge. Left eye exhibits no discharge. No scleral icterus.  Neck: Normal range of motion. Neck supple. No tracheal deviation present. No thyromegaly present.  Cardiovascular: Normal rate, regular rhythm, normal heart sounds and intact distal pulses.  Exam reveals no gallop and no friction rub.   No murmur heard. Respiratory: Effort normal and breath sounds normal. No respiratory distress. She has no wheezes. She has no rales. She exhibits no tenderness.  GI: Soft. Bowel sounds are normal. She exhibits no distension and no mass. There is no tenderness. There is no rebound and no guarding.  Genitourinary:  Breasts no masses skin changes or nipple changes bilaterally      Vulva is normal without lesions Vagina is pink moist without discharge Cervix normal in appearance and pap is done strings are present Uterus is normal size shape and contour Adnexa is negative with normal sized ovaries   Musculoskeletal: Normal range of motion. She exhibits no edema and no tenderness.  Neurological: She is alert and oriented to person, place, and time. She has normal reflexes. She displays normal reflexes. No cranial nerve deficit. She exhibits normal muscle tone. Coordination normal.  Skin: Skin is warm and dry. No rash noted. No erythema. No pallor.  Psychiatric: She has a normal mood and affect. Her behavior is normal. Judgment and thought content normal.       Medications Ordered at today's visit: No orders of the defined types were placed in this  encounter.   Other orders placed at today's visit: No orders of the defined types were placed in this encounter.     Assessment:    Normal Gyn exam.  Plan:    Contraception: IUD. Follow up in: 3 years.     Return in about 3 years (around 04/07/2026), or if symptoms worsen or fail to improve, for yearly.

## 2023-04-10 LAB — CYTOLOGY - PAP
Comment: NEGATIVE
Diagnosis: NEGATIVE
High risk HPV: NEGATIVE

## 2023-08-22 ENCOUNTER — Inpatient Hospital Stay: Payer: Commercial Managed Care - PPO | Attending: Hematology

## 2023-08-22 DIAGNOSIS — Z808 Family history of malignant neoplasm of other organs or systems: Secondary | ICD-10-CM | POA: Diagnosis not present

## 2023-08-22 DIAGNOSIS — E538 Deficiency of other specified B group vitamins: Secondary | ICD-10-CM | POA: Diagnosis not present

## 2023-08-22 DIAGNOSIS — D509 Iron deficiency anemia, unspecified: Secondary | ICD-10-CM | POA: Diagnosis present

## 2023-08-22 DIAGNOSIS — K2950 Unspecified chronic gastritis without bleeding: Secondary | ICD-10-CM | POA: Insufficient documentation

## 2023-08-22 DIAGNOSIS — R7402 Elevation of levels of lactic acid dehydrogenase (LDH): Secondary | ICD-10-CM | POA: Insufficient documentation

## 2023-08-22 DIAGNOSIS — R5383 Other fatigue: Secondary | ICD-10-CM | POA: Insufficient documentation

## 2023-08-22 DIAGNOSIS — E559 Vitamin D deficiency, unspecified: Secondary | ICD-10-CM | POA: Insufficient documentation

## 2023-08-22 DIAGNOSIS — D508 Other iron deficiency anemias: Secondary | ICD-10-CM

## 2023-08-22 LAB — CBC WITH DIFFERENTIAL/PLATELET
Abs Immature Granulocytes: 0.02 10*3/uL (ref 0.00–0.07)
Basophils Absolute: 0 10*3/uL (ref 0.0–0.1)
Basophils Relative: 1 %
Eosinophils Absolute: 0.1 10*3/uL (ref 0.0–0.5)
Eosinophils Relative: 1 %
HCT: 42 % (ref 36.0–46.0)
Hemoglobin: 13.9 g/dL (ref 12.0–15.0)
Immature Granulocytes: 0 %
Lymphocytes Relative: 21 %
Lymphs Abs: 1.5 10*3/uL (ref 0.7–4.0)
MCH: 29.1 pg (ref 26.0–34.0)
MCHC: 33.1 g/dL (ref 30.0–36.0)
MCV: 87.9 fL (ref 80.0–100.0)
Monocytes Absolute: 0.4 10*3/uL (ref 0.1–1.0)
Monocytes Relative: 5 %
Neutro Abs: 5.1 10*3/uL (ref 1.7–7.7)
Neutrophils Relative %: 72 %
Platelets: 179 10*3/uL (ref 150–400)
RBC: 4.78 MIL/uL (ref 3.87–5.11)
RDW: 12.5 % (ref 11.5–15.5)
WBC: 7 10*3/uL (ref 4.0–10.5)
nRBC: 0 % (ref 0.0–0.2)

## 2023-08-22 LAB — COMPREHENSIVE METABOLIC PANEL
ALT: 18 U/L (ref 0–44)
AST: 17 U/L (ref 15–41)
Albumin: 4.7 g/dL (ref 3.5–5.0)
Alkaline Phosphatase: 35 U/L — ABNORMAL LOW (ref 38–126)
Anion gap: 8 (ref 5–15)
BUN: 16 mg/dL (ref 6–20)
CO2: 24 mmol/L (ref 22–32)
Calcium: 9.3 mg/dL (ref 8.9–10.3)
Chloride: 103 mmol/L (ref 98–111)
Creatinine, Ser: 0.58 mg/dL (ref 0.44–1.00)
GFR, Estimated: >60 mL/min (ref >60)
Glucose, Bld: 99 mg/dL (ref 70–99)
Potassium: 4.7 mmol/L (ref 3.5–5.1)
Sodium: 135 mmol/L (ref 135–145)
Total Bilirubin: 1.3 mg/dL — ABNORMAL HIGH (ref 0.3–1.2)
Total Protein: 7.9 g/dL (ref 6.5–8.1)

## 2023-08-22 LAB — FOLATE: Folate: 4.6 ng/mL — ABNORMAL LOW (ref >5.9)

## 2023-08-22 LAB — IRON AND TIBC
Iron: 65 ug/dL (ref 28–170)
Saturation Ratios: 20 % (ref 10.4–31.8)
TIBC: 324 ug/dL (ref 250–450)
UIBC: 259 ug/dL

## 2023-08-22 LAB — VITAMIN B12: Vitamin B-12: 441 pg/mL (ref 180–914)

## 2023-08-22 LAB — FERRITIN: Ferritin: 60 ng/mL (ref 11–307)

## 2023-08-22 LAB — VITAMIN D 25 HYDROXY (VIT D DEFICIENCY, FRACTURES): Vit D, 25-Hydroxy: 32.68 ng/mL (ref 30–100)

## 2023-08-23 ENCOUNTER — Other Ambulatory Visit: Payer: Commercial Managed Care - PPO

## 2023-08-24 LAB — METHYLMALONIC ACID, SERUM: Methylmalonic Acid, Quantitative: 160 nmol/L (ref 0–378)

## 2023-08-30 ENCOUNTER — Inpatient Hospital Stay: Payer: Commercial Managed Care - PPO | Admitting: Oncology

## 2023-08-30 VITALS — BP 109/83 | HR 80 | Temp 98.2°F | Resp 16 | Wt 127.0 lb

## 2023-08-30 DIAGNOSIS — D508 Other iron deficiency anemias: Secondary | ICD-10-CM

## 2023-08-30 DIAGNOSIS — D509 Iron deficiency anemia, unspecified: Secondary | ICD-10-CM | POA: Diagnosis not present

## 2023-08-30 DIAGNOSIS — E538 Deficiency of other specified B group vitamins: Secondary | ICD-10-CM | POA: Diagnosis not present

## 2023-08-30 NOTE — Progress Notes (Unsigned)
[facility] [address], [place] [address]   CLINIC:  Medical Oncology/Hematology Continues to have PCP:  Lawerance Sabal, [place] [address] [place] [address] [phone]   REASON FOR VISIT:  Follow-up for iron deficiency anemia   CURRENT THERAPY: Intermittent IV iron infusions  INTERVAL HISTORY:   Carol Bernard 28 y.o. female returns for routine follow-up of her iron deficiency anemia.  She was last seen in clinic by Rojelio Brenner, PA on 02/25/2023.  In the interim, she denies any recent hospitalizations, surgeries or changes in baseline health.  She last received IV iron in February/March 2023.  Reports some significant fatigue and weakness since the spring.  Has intermittent shortness of breath and palpitations.  Feels like she may need additional IV iron.  She currently takes ferrous sulfate 325 mg tablets daily.  Reports a low appetite but she drinks plenty of fluids.  She denies any episodes of epistaxis, hematemesis, hematochezia or melena.  Denies heavy menstrual cycles secondary to ParaGard IUD.  She is able to take and tolerate oral iron along with B12 vitamins.   Additional symptoms further described in ROS, as they are not directly pertinent to today's visit.  She has 50% energy and 50% appetite.  Her weight is stable compared to her visit 6 months ago, although she had previously lost about 20 pounds in the previous 6 months due to stress and decreased appetite.  ASSESSMENT & PLAN:  1.  Iron deficiency anemia: - She denies menorrhagia/heavy periods since having IUD placed. -Denies bright red blood per rectum or melena. -Last iron infusion was on 02/21/2022 and 03/07/2022.  -Most recent labs from 08/22/2023 showed normal CMP, ferritin 60 and iron saturations 20%.  Hemoglobin is 13.9 and differential is normal.  Normal B12 level, vitamin D level and MMA.  Folate low at 4.6. -No additional IV iron needed at this time.  Continue oral iron.  2.  Folic  acid deficiency - Labs from 08/22/2023 showed folate level 4.6 (3.2). -Continue folic acid 1 mg daily.   3.  Indirect hyperbilirubinemia: - She was found to have elevated total bilirubin on 04/01/2019 at 2.7. - LDH was mildly elevated.  It was predominantly indirect bilirubin.  Direct Coombs test was negative for IgG and complement. - DAT negative hemolytic anemia evaluation labs were also negative. - CT abdomen/pelvis (04/01/2019): Spleen 12 cm (upper limit of normal) - Abdominal ultrasound (12/09/2019): Normal liver and spleen - Differential diagnosis includes Gilbert's syndrome, and she was referred to gastroenterology - GI did upper endoscopy on 01/15/2020 with biopsies.  Mild chronic gastritis.  No other pathological findings; negative for celiac disease.  No source of bleeding. -Repeat total bili from 08/22/2023 was 1.3.  Continue to monitor.   4.  Vitamin D deficiency: - Vitamin D level from 08/22/2023 is 32.68. -Continue daily multivitamin with vitamin D 1000 units every other day.  5.  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  Only 1 phospholipid dependent assay showed evidence of positivity, with hexagonal phospholipid neutralization assay following just above normal reference interval.  This may have been falsely positive in the setting of pregnancy and Lovenox. Lupus anticoagulant  panel negative on 01/28/2019 Lupus anticoagulant panel negative on 06/19/2019 - She had initial consultation with rheumatology (Dr. Dimple Casey) November 2023, but canceled her follow-up visit in January  -Patient does not meet criteria for lupus anticoagulant syndrome or antiphospholipid antibody syndrome.  Lovenox is not indicated at this time.  PLAN SUMMARY: >> Continue folic acid 1 mg daily, vitamin D and iron supplements. >> RTC in 6 months with labs (CBC/D, CMP, ferritin, iron/iron panel, folate, vitamin D, B12 and MMA.     REVIEW OF SYSTEMS:   Review of Systems - Oncology   PHYSICAL EXAM:  ECOG PERFORMANCE STATUS: 1 - Symptomatic but completely ambulatory  There were no vitals filed for this visit. There were no vitals filed for this visit. Physical Exam  PAST MEDICAL/SURGICAL HISTORY:  Past Medical History:  Diagnosis Date  . Allergy   . Anemia   . Anxiety and depression   . Asthma   . Birth defect   . Blood transfusion without reported diagnosis   . Clotting disorder (HCC)   . Complication of anesthesia    'I had stridor after my 1st D&C but with my 2nd one they gave me a breathing treatment before my surgery and I did ok".  . Cyst of left kidney    frequent cysts to kidneys  . Dysrhythmia    h/o SVT  . Family history of adverse reaction to anesthesia   . GERD (gastroesophageal reflux disease)   . Headache   . History of recurrent miscarriages   . Hypoglycemia   . Iron deficiency   . Lupus anticoagulant positive   . Neuropathic pain   . Other fatigue 02/16/2022  . Palpitations    Past Surgical History:  Procedure Laterality Date  . ABLATION    . CESAREAN SECTION    . CHOLECYSTECTOMY N/A 07/23/2018   Procedure: LAPAROSCOPIC CHOLECYSTECTOMY;  Surgeon: Lucretia Roers, MD;  Location: [facility];  Service: General;  Laterality: N/A;  . DILATION AND CURETTAGE OF UTERUS N/A 01/29/2017   Procedure: SUCTION DILATATION AND CURETTAGE;  Surgeon: Tilda Burrow, MD;   Location: [facility];  Service: Gynecology;  Laterality: N/A;  . DILATION AND CURETTAGE OF UTERUS N/A 05/01/2017   Procedure: SUCTION DILATATION AND CURETTAGE;  Surgeon: Lazaro Arms, MD;  Location: [facility];  Service: Gynecology;  Laterality: N/A;  pt to arrive at 7:15 to have labwork  . FETOSCOPIC LASER PHOTOCOAGULATION  11/14/2017   of 34 placental vessels for TTTS  . WISDOM TOOTH EXTRACTION      SOCIAL HISTORY:  Social History   Socioeconomic History  . Marital status: Married    Spouse name: Not on file  . Number of children: Not on file  . Years of education: Not on file  . Highest education level: Not on file  Occupational History  . Occupation: Loss adjuster, chartered  . Occupation: Psychologist, sport and exercise  Tobacco Use  . Smoking status: Never    Passive exposure: Never  . Smokeless tobacco: Never  Vaping Use  . Vaping status: Never Used  Substance and Sexual Activity  . Alcohol use: No    Alcohol/week: 0.0 standard drinks of alcohol  . Drug use: No  . Sexual activity: Yes    Birth control/protection:  I.U.D.  Other Topics Concern  . Not on file  Social History Narrative  . Not on file   Social Determinants of Health   Financial Resource Strain: Low Risk  (04/08/2023)   Overall Financial Resource Strain (CARDIA)   . Difficulty of Paying Living Expenses: Not very hard  Food Insecurity: No Food Insecurity (04/08/2023)   Hunger Vital Sign   . Worried About Programme researcher, broadcasting/film/video in the Last Year: Never true   . Ran Out of Food in the Last Year: Never true  Transportation Needs: No Transportation Needs (04/08/2023)   PRAPARE - Transportation   . Lack of Transportation (Medical): No   . Lack of Transportation (Non-Medical): No  Physical Activity: Insufficiently Active (04/08/2023)   Exercise Vital Sign   . Days of Exercise per Week: 7 days   . Minutes of Exercise per Session: 20 min  Stress: Stress Concern Present (04/08/2023)   [employer] of Occupational Health - Occupational Stress  Questionnaire   . Feeling of Stress : To some extent  Social Connections: Moderately Integrated (04/08/2023)   Social Connection and Isolation Panel [NHANES]   . Frequency of Communication with Friends and Family: Three times a week   . Frequency of Social Gatherings with Friends and Family: Twice a week   . Attends Religious Services: More than 4 times per year   . Active Member of Clubs or Organizations: No   . Attends Banker Meetings: Never   . Marital Status: Married  Catering manager Violence: Not At Risk (04/08/2023)   Humiliation, Afraid, Rape, and Kick questionnaire   . Fear of Current or Ex-Partner: No   . Emotionally Abused: No   . Physically Abused: No   . Sexually Abused: No    FAMILY HISTORY:  Family History  Adopted: Yes  Problem Relation Age of Onset  . Hypertension Maternal Grandmother   . Skin cancer Maternal Grandmother   . Heart disease Maternal Grandfather   . Hypertension Mother   . Migraines Sister     CURRENT MEDICATIONS:  Outpatient Encounter Medications as of 08/30/2023  Medication Sig  . albuterol (VENTOLIN HFA) 108 (90 Base) MCG/ACT inhaler albuterol sulfate HFA 90 mcg/actuation aerosol inhaler  INHALE 2 PUFFS BY MOUTH FOUR TIMES DAILY  . amphetamine-dextroamphetamine (ADDERALL XR) 25 MG 24 hr capsule Take by mouth every morning.  . dicyclomine (BENTYL) 10 MG capsule Take 10 mg by mouth every 6 (six) hours as needed. (Patient not taking: Reported on 11/20/2022)  . folic acid (FOLVITE) 1 MG tablet Take 1 tablet (1 mg total) by mouth daily.  Marland Kitchen gabapentin (NEURONTIN) 300 MG capsule Take 1 capsule (300 mg total) by mouth 2 (two) times daily. (Patient taking differently: Take 100 mg by mouth daily.)  . HYDROcodone-acetaminophen (NORCO/VICODIN) 5-325 MG tablet Take 1 tablet by mouth 3 (three) times daily as needed. (Patient not taking: Reported on 04/08/2023)  . Iron Combinations (IRON COMPLEX) CAPS Take by mouth.  . lidocaine (LIDODERM) 5 % Place 1  patch onto the skin daily as needed (back pain). May wear up to 12 hours  . methocarbamol (ROBAXIN) 500 MG tablet methocarbamol 500 mg tablet  TAKE 1 TABLET BY MOUTH TWICE A DAY AS NEEDED  . omeprazole (PRILOSEC) 40 MG capsule Take 40 mg by mouth 2 (two) times daily.  . ondansetron (ZOFRAN) 8 MG tablet Take by mouth. (Patient not taking: Reported on 04/08/2023)  . paragard intrauterine copper IUD IUD ParaGard T 380A 380 square mm intrauterine device  .  SYMBICORT 80-4.5 MCG/ACT inhaler SMARTSIG:2 Inhalation By Mouth Twice Daily  . vitamin B-12 (CYANOCOBALAMIN) 50 MCG tablet Take 50 mcg by mouth daily.  . Vitamin D, Ergocalciferol, (DRISDOL) 1.25 MG (50000 UNIT) CAPS capsule Take 50,000 Units by mouth every 7 (seven) days.   Facility-Administered Encounter Medications as of 08/30/2023  Medication  . 0.9 %  sodium chloride infusion    ALLERGIES:  Allergies  Allergen Reactions  . Pecan Extract Rash and Swelling  . Pecan Nut (Diagnostic) Hives  . Reglan [Metoclopramide] Hives    LABORATORY DATA:  I have reviewed the labs as listed.  CBC    Component Value Date/Time   WBC 7.0 08/22/2023 1058   RBC 4.78 08/22/2023 1058   HGB 13.9 08/22/2023 1058   HGB 12.1 04/28/2018 1611   HCT 42.0 08/22/2023 1058   HCT 35.5 04/28/2018 1611   PLT 179 08/22/2023 1058   PLT 187 04/28/2018 1611   MCV 87.9 08/22/2023 1058   MCV 83 04/28/2018 1611   MCH 29.1 08/22/2023 1058   MCHC 33.1 08/22/2023 1058   RDW 12.5 08/22/2023 1058   RDW 14.0 04/28/2018 1611   LYMPHSABS 1.5 08/22/2023 1058   LYMPHSABS 1.4 04/28/2018 1611   MONOABS 0.4 08/22/2023 1058   EOSABS 0.1 08/22/2023 1058   EOSABS 0.1 04/28/2018 1611   BASOSABS 0.0 08/22/2023 1058   BASOSABS 0.0 04/28/2018 1611      Latest Ref Rng & Units 08/22/2023   10:58 AM 02/15/2023    9:20 AM 07/21/2021    9:11 AM  CMP  Glucose 70 - 99 mg/dL 99  90  161   BUN 6 - 20 mg/dL 16  18  14    Creatinine 0.44 - 1.00 mg/dL 0.96  0.45  4.09   Sodium 135 - 145  mmol/L 135  137  138   Potassium 3.5 - 5.1 mmol/L 4.7  4.2  4.2   Chloride 98 - 111 mmol/L 103  105  106   CO2 22 - 32 mmol/L 24  25  26    Calcium 8.9 - 10.3 mg/dL 9.3  8.8  9.3   Total Protein 6.5 - 8.1 g/dL 7.9  7.3  8.3   Total Bilirubin 0.3 - 1.2 mg/dL 1.3  1.8  1.5   Alkaline Phos 38 - 126 U/L 35  36  53   AST 15 - 41 U/L 17  16  22    ALT 0 - 44 U/L 18  13  24      DIAGNOSTIC IMAGING:  I have independently reviewed the relevant imaging and discussed with the patient.   WRAP UP:  All questions were answered. The patient knows to call the clinic with any problems, questions or concerns.  Medical decision making: Moderate  Time spent on visit: I spent 25 minutes dedicated to the care of this patient (face-to-face and non-face-to-face) on the date of the encounter to include what is described in the assessment and plan.  Mauro Kaufmann, NP  02/25/2023 9:07 AM

## 2023-09-02 ENCOUNTER — Encounter (HOSPITAL_COMMUNITY): Payer: Self-pay | Admitting: Hematology

## 2023-09-13 ENCOUNTER — Inpatient Hospital Stay: Payer: Commercial Managed Care - PPO | Attending: Hematology

## 2023-09-13 VITALS — BP 105/65 | HR 63 | Temp 97.2°F | Resp 18

## 2023-09-13 DIAGNOSIS — E538 Deficiency of other specified B group vitamins: Secondary | ICD-10-CM | POA: Diagnosis not present

## 2023-09-13 DIAGNOSIS — E559 Vitamin D deficiency, unspecified: Secondary | ICD-10-CM | POA: Diagnosis not present

## 2023-09-13 DIAGNOSIS — D508 Other iron deficiency anemias: Secondary | ICD-10-CM

## 2023-09-13 DIAGNOSIS — D509 Iron deficiency anemia, unspecified: Secondary | ICD-10-CM | POA: Diagnosis present

## 2023-09-13 DIAGNOSIS — R5383 Other fatigue: Secondary | ICD-10-CM

## 2023-09-13 MED ORDER — ACETAMINOPHEN 325 MG PO TABS
650.0000 mg | ORAL_TABLET | Freq: Once | ORAL | Status: AC
  Administered 2023-09-13: 650 mg via ORAL
  Filled 2023-09-13: qty 2

## 2023-09-13 MED ORDER — SODIUM CHLORIDE 0.9 % IV SOLN: Freq: Once | INTRAVENOUS | Status: AC

## 2023-09-13 MED ORDER — CETIRIZINE HCL 10 MG PO TABS
10.0000 mg | ORAL_TABLET | Freq: Once | ORAL | Status: AC
  Administered 2023-09-13: 10 mg via ORAL
  Filled 2023-09-13: qty 1

## 2023-09-13 MED ORDER — SODIUM CHLORIDE 0.9 % IV SOLN
300.0000 mg | Freq: Once | INTRAVENOUS | Status: AC
  Administered 2023-09-13: 300 mg via INTRAVENOUS
  Filled 2023-09-13: qty 300

## 2023-09-13 NOTE — Progress Notes (Signed)
Patient presents today for iron infusion.  Patient is in satisfactory condition with no new complaints voiced.  Vital signs are stable.  We will proceed with infusion per provider orders.    Treatment given today per MD orders. Tolerated infusion without adverse affects. Vital signs stable. No complaints at this time. Discharged from clinic ambulatory in stable condition. Alert and oriented x 3. F/U with [facility] as scheduled.

## 2023-09-13 NOTE — Patient Instructions (Signed)
[facility] [place]  Discharge Instructions: Thank you for choosing [facility] to provide your oncology and hematology care.  If you have a lab appointment with the Cancer Center - please note that after April 8th, 2024, all labs will be drawn in the cancer center.  You do not have to check in or register with the main entrance as you have in the past but will complete your check-in in the cancer center.  Wear comfortable clothing and clothing appropriate for easy access to any Portacath or PICC line.   We strive to give you quality time with your provider. You may need to reschedule your appointment if you arrive late (15 or more minutes).  Arriving late affects you and other patients whose appointments are after yours.  Also, if you miss three or more appointments without notifying the office, you may be dismissed from the clinic at the provider's discretion.      For prescription refill requests, have your pharmacy contact our office and allow 72 hours for refills to be completed.    Today you received iron infusion     BELOW ARE SYMPTOMS THAT SHOULD BE REPORTED IMMEDIATELY: *FEVER GREATER THAN 100.4 F (38 C) OR HIGHER *CHILLS OR SWEATING *NAUSEA AND VOMITING THAT IS NOT CONTROLLED WITH YOUR NAUSEA MEDICATION *UNUSUAL SHORTNESS OF BREATH *UNUSUAL BRUISING OR BLEEDING *URINARY PROBLEMS (pain or burning when urinating, or frequent urination) *BOWEL PROBLEMS (unusual diarrhea, constipation, pain near the anus) TENDERNESS IN MOUTH AND THROAT WITH OR WITHOUT PRESENCE OF ULCERS (sore throat, sores in mouth, or a toothache) UNUSUAL RASH, SWELLING OR PAIN  UNUSUAL VAGINAL DISCHARGE OR ITCHING   Items with * indicate a potential emergency and should be followed up as soon as possible or go to the Emergency Department if any problems should occur.  Please show the CHEMOTHERAPY ALERT CARD or IMMUNOTHERAPY ALERT CARD at check-in to the Emergency Department and triage  nurse.  Should you have questions after your visit or need to cancel or reschedule your appointment, please contact [facility] AT [facility] [phone]  and follow the prompts.  Office hours are 8:00 a.m. to 4:30 p.m. Monday - Friday. Please note that voicemails left after 4:00 p.m. may not be returned until the following business day.  We are closed weekends and major holidays. You have access to a nurse at all times for urgent questions. Please call the main number to the clinic [phone] and follow the prompts.  For any non-urgent questions, you may also contact your provider using MyChart. We now offer e-Visits for anyone 38 and older to request care online for non-urgent symptoms. For details visit [email or web address].   Also download the MyChart app! Go to the app store, search "MyChart", open the app, select [place], and log in with your MyChart username and password.

## 2023-09-27 ENCOUNTER — Inpatient Hospital Stay: Payer: Commercial Managed Care - PPO

## 2023-10-11 ENCOUNTER — Inpatient Hospital Stay: Payer: Commercial Managed Care - PPO

## 2023-10-17 ENCOUNTER — Inpatient Hospital Stay: Payer: Commercial Managed Care - PPO | Attending: Hematology

## 2023-10-17 VITALS — BP 110/62 | HR 75 | Temp 99.0°F | Resp 18 | Ht 65.0 in | Wt 124.9 lb

## 2023-10-17 DIAGNOSIS — Z79899 Other long term (current) drug therapy: Secondary | ICD-10-CM | POA: Insufficient documentation

## 2023-10-17 DIAGNOSIS — D509 Iron deficiency anemia, unspecified: Secondary | ICD-10-CM | POA: Diagnosis present

## 2023-10-17 DIAGNOSIS — R5383 Other fatigue: Secondary | ICD-10-CM

## 2023-10-17 DIAGNOSIS — D508 Other iron deficiency anemias: Secondary | ICD-10-CM

## 2023-10-17 MED ORDER — SODIUM CHLORIDE 0.9 % IV SOLN: Freq: Once | INTRAVENOUS | Status: AC

## 2023-10-17 MED ORDER — SODIUM CHLORIDE 0.9 % IV SOLN
300.0000 mg | Freq: Once | INTRAVENOUS | Status: AC
  Administered 2023-10-17: 300 mg via INTRAVENOUS
  Filled 2023-10-17: qty 5

## 2023-10-17 NOTE — Patient Instructions (Signed)
[facility] [place]  Discharge Instructions: Thank you for choosing [facility] to provide your oncology and hematology care.  If you have a lab appointment with the Cancer Center - please note that after April 8th, 2024, all labs will be drawn in the cancer center.  You do not have to check in or register with the main entrance as you have in the past but will complete your check-in in the cancer center.  Wear comfortable clothing and clothing appropriate for easy access to any Portacath or PICC line.   We strive to give you quality time with your provider. You may need to reschedule your appointment if you arrive late (15 or more minutes).  Arriving late affects you and other patients whose appointments are after yours.  Also, if you miss three or more appointments without notifying the office, you may be dismissed from the clinic at the provider's discretion.      For prescription refill requests, have your pharmacy contact our office and allow 72 hours for refills to be completed.    Today you received the following Venofer.  Iron Sucrose Injection What is this medication? IRON SUCROSE (EYE ern SOO krose) treats low levels of iron (iron deficiency anemia) in people with kidney disease. Iron is a mineral that plays an important role in making red blood cells, which carry oxygen from your lungs to the rest of your body. This medicine may be used for other purposes; ask your health care provider or pharmacist if you have questions. COMMON BRAND NAME(S): Venofer What should I tell my care team before I take this medication? They need to know if you have any of these conditions: Anemia not caused by low iron levels Heart disease High levels of iron in the blood Kidney disease Liver disease An unusual or allergic reaction to iron, other medications, foods, dyes, or preservatives Pregnant or trying to get pregnant Breastfeeding How should I use this  medication? This medication is for infusion into a vein. It is given in a hospital or clinic setting. Talk to your care team about the use of this medication in children. While this medication may be prescribed for children as young as 2 years for selected conditions, precautions do apply. Overdosage: If you think you have taken too much of this medicine contact a poison control center or emergency room at once. NOTE: This medicine is only for you. Do not share this medicine with others. What if I miss a dose? Keep appointments for follow-up doses. It is important not to miss your dose. Call your care team if you are unable to keep an appointment. What may interact with this medication? Do not take this medication with any of the following: Deferoxamine Dimercaprol Other iron products This medication may also interact with the following: Chloramphenicol Deferasirox This list may not describe all possible interactions. Give your health care provider a list of all the medicines, herbs, non-prescription drugs, or dietary supplements you use. Also tell them if you smoke, drink alcohol, or use illegal drugs. Some items may interact with your medicine. What should I watch for while using this medication? Visit your care team regularly. Tell your care team if your symptoms do not start to get better or if they get worse. You may need blood work done while you are taking this medication. You may need to follow a special diet. Talk to your care team. Foods that contain iron include: whole grains/cereals, dried fruits, beans, or  peas, leafy green vegetables, and organ meats (liver, kidney). What side effects may I notice from receiving this medication? Side effects that you should report to your care team as soon as possible: Allergic reactions--skin rash, itching, hives, swelling of the face, lips, tongue, or throat Low blood pressure--dizziness, feeling faint or lightheaded, blurry vision Shortness of  breath Side effects that usually do not require medical attention (report to your care team if they continue or are bothersome): Flushing Headache Joint pain Muscle pain Nausea Pain, redness, or irritation at injection site This list may not describe all possible side effects. Call your doctor for medical advice about side effects. You may report side effects to FDA at 1-800-FDA-1088. Where should I keep my medication? This medication is given in a hospital or clinic. It will not be stored at home. NOTE: This sheet is a summary. It may not cover all possible information. If you have questions about this medicine, talk to your doctor, pharmacist, or health care provider.  2024 Elsevier/Gold Standard (2023-05-24 00:00:00)    To help prevent nausea and vomiting after your treatment, we encourage you to take your nausea medication as directed.  BELOW ARE SYMPTOMS THAT SHOULD BE REPORTED IMMEDIATELY: *FEVER GREATER THAN 100.4 F (38 C) OR HIGHER *CHILLS OR SWEATING *NAUSEA AND VOMITING THAT IS NOT CONTROLLED WITH YOUR NAUSEA MEDICATION *UNUSUAL SHORTNESS OF BREATH *UNUSUAL BRUISING OR BLEEDING *URINARY PROBLEMS (pain or burning when urinating, or frequent urination) *BOWEL PROBLEMS (unusual diarrhea, constipation, pain near the anus) TENDERNESS IN MOUTH AND THROAT WITH OR WITHOUT PRESENCE OF ULCERS (sore throat, sores in mouth, or a toothache) UNUSUAL RASH, SWELLING OR PAIN  UNUSUAL VAGINAL DISCHARGE OR ITCHING   Items with * indicate a potential emergency and should be followed up as soon as possible or go to the Emergency Department if any problems should occur.  Please show the CHEMOTHERAPY ALERT CARD or IMMUNOTHERAPY ALERT CARD at check-in to the Emergency Department and triage nurse.  Should you have questions after your visit or need to cancel or reschedule your appointment, please contact [facility] [phone]  and follow the prompts.  Office hours are  8:00 a.m. to 4:30 p.m. Monday - Friday. Please note that voicemails left after 4:00 p.m. may not be returned until the following business day.  We are closed weekends and major holidays. You have access to a nurse at all times for urgent questions. Please call the main number to the clinic [phone] and follow the prompts.  For any non-urgent questions, you may also contact your provider using MyChart. We now offer e-Visits for anyone 72 and older to request care online for non-urgent symptoms. For details visit [email or web address].   Also download the MyChart app! Go to the app store, search "MyChart", open the app, select [place], and log in with your MyChart username and password.

## 2023-10-17 NOTE — Progress Notes (Signed)
Patient presents today for iron infusion.  Patient is in satisfactory condition with no new complaints voiced.  Vital signs are stable.  IV placed in L arm.  IV flushed well with good blood return noted.  Tylenol and Zyrtec taken at 1020 at home prior to visit.  We will proceed with infusion per provider orders.    Patient tolerated infusion well with no complaints voiced.  Patient left ambulatory in stable condition.  Vital signs stable at discharge.  Follow up as scheduled.

## 2023-10-31 ENCOUNTER — Inpatient Hospital Stay: Payer: Commercial Managed Care - PPO

## 2023-10-31 VITALS — BP 109/74 | HR 65 | Temp 98.5°F | Resp 18

## 2023-10-31 DIAGNOSIS — D509 Iron deficiency anemia, unspecified: Secondary | ICD-10-CM | POA: Diagnosis not present

## 2023-10-31 DIAGNOSIS — D508 Other iron deficiency anemias: Secondary | ICD-10-CM

## 2023-10-31 DIAGNOSIS — R5383 Other fatigue: Secondary | ICD-10-CM

## 2023-10-31 MED ORDER — SODIUM CHLORIDE 0.9 % IV SOLN: Freq: Once | INTRAVENOUS | Status: AC

## 2023-10-31 MED ORDER — SODIUM CHLORIDE 0.9 % IV SOLN
300.0000 mg | Freq: Once | INTRAVENOUS | Status: AC
  Administered 2023-10-31: 300 mg via INTRAVENOUS
  Filled 2023-10-31: qty 5

## 2023-10-31 NOTE — Progress Notes (Signed)
Patient presents today for iron infusion, patient reports taking pre-meds at home. Patient tolerated iron infusion with no complaints voiced. Peripheral IV site clean and dry with good blood return noted before and after infusion. Band aid applied. VSS with discharge and left in satisfactory condition with no s/s of distress noted.

## 2023-10-31 NOTE — Patient Instructions (Signed)
[facility] [place]  Discharge Instructions: Thank you for choosing [facility] to provide your oncology and hematology care.  If you have a lab appointment with the Cancer Center - please note that after April 8th, 2024, all labs will be drawn in the cancer center.  You do not have to check in or register with the main entrance as you have in the past but will complete your check-in in the cancer center.  Wear comfortable clothing and clothing appropriate for easy access to any Portacath or PICC line.   We strive to give you quality time with your provider. You may need to reschedule your appointment if you arrive late (15 or more minutes).  Arriving late affects you and other patients whose appointments are after yours.  Also, if you miss three or more appointments without notifying the office, you may be dismissed from the clinic at the provider's discretion.      For prescription refill requests, have your pharmacy contact our office and allow 72 hours for refills to be completed.    Today you received the following Venofer, return as scheduled.   To help prevent nausea and vomiting after your treatment, we encourage you to take your nausea medication as directed.  BELOW ARE SYMPTOMS THAT SHOULD BE REPORTED IMMEDIATELY: *FEVER GREATER THAN 100.4 F (38 C) OR HIGHER *CHILLS OR SWEATING *NAUSEA AND VOMITING THAT IS NOT CONTROLLED WITH YOUR NAUSEA MEDICATION *UNUSUAL SHORTNESS OF BREATH *UNUSUAL BRUISING OR BLEEDING *URINARY PROBLEMS (pain or burning when urinating, or frequent urination) *BOWEL PROBLEMS (unusual diarrhea, constipation, pain near the anus) TENDERNESS IN MOUTH AND THROAT WITH OR WITHOUT PRESENCE OF ULCERS (sore throat, sores in mouth, or a toothache) UNUSUAL RASH, SWELLING OR PAIN  UNUSUAL VAGINAL DISCHARGE OR ITCHING   Items with * indicate a potential emergency and should be followed up as soon as possible or go to the Emergency Department if  any problems should occur.  Please show the CHEMOTHERAPY ALERT CARD or IMMUNOTHERAPY ALERT CARD at check-in to the Emergency Department and triage nurse.  Should you have questions after your visit or need to cancel or reschedule your appointment, please contact [facility] [place] [phone]  and follow the prompts.  Office hours are 8:00 a.m. to 4:30 p.m. Monday - Friday. Please note that voicemails left after 4:00 p.m. may not be returned until the following business day.  We are closed weekends and major holidays. You have access to a nurse at all times for urgent questions. Please call the main number to the clinic [phone] and follow the prompts.  For any non-urgent questions, you may also contact your provider using MyChart. We now offer e-Visits for anyone 18 and older to request care online for non-urgent symptoms. For details visit [email or web address].   Also download the MyChart app! Go to the app store, search "MyChart", open the app, select , and log in with your MyChart username and password.   

## 2023-12-23 ENCOUNTER — Telehealth: Payer: Self-pay

## 2023-12-23 NOTE — Telephone Encounter (Signed)
Notified Patient that FMLA forms were faxed again to [employer] per her request. Fax transmission confirmation received. No other needs or concerns voiced at this time.

## 2024-01-02 ENCOUNTER — Other Ambulatory Visit: Payer: Self-pay

## 2024-01-02 DIAGNOSIS — E559 Vitamin D deficiency, unspecified: Secondary | ICD-10-CM

## 2024-01-02 DIAGNOSIS — E538 Deficiency of other specified B group vitamins: Secondary | ICD-10-CM

## 2024-01-02 DIAGNOSIS — D508 Other iron deficiency anemias: Secondary | ICD-10-CM

## 2024-01-03 ENCOUNTER — Inpatient Hospital Stay: Payer: Commercial Managed Care - PPO | Attending: Hematology

## 2024-01-03 DIAGNOSIS — E538 Deficiency of other specified B group vitamins: Secondary | ICD-10-CM | POA: Diagnosis not present

## 2024-01-03 DIAGNOSIS — E559 Vitamin D deficiency, unspecified: Secondary | ICD-10-CM | POA: Diagnosis not present

## 2024-01-03 DIAGNOSIS — D508 Other iron deficiency anemias: Secondary | ICD-10-CM

## 2024-01-03 DIAGNOSIS — R531 Weakness: Secondary | ICD-10-CM | POA: Insufficient documentation

## 2024-01-03 DIAGNOSIS — D509 Iron deficiency anemia, unspecified: Secondary | ICD-10-CM | POA: Insufficient documentation

## 2024-01-03 DIAGNOSIS — R519 Headache, unspecified: Secondary | ICD-10-CM | POA: Diagnosis not present

## 2024-01-03 LAB — CBC WITH DIFFERENTIAL/PLATELET
Abs Immature Granulocytes: 0.02 10*3/uL (ref 0.00–0.07)
Basophils Absolute: 0 10*3/uL (ref 0.0–0.1)
Basophils Relative: 0 %
Eosinophils Absolute: 0.1 10*3/uL (ref 0.0–0.5)
Eosinophils Relative: 2 %
HCT: 40.8 % (ref 36.0–46.0)
Hemoglobin: 13.3 g/dL (ref 12.0–15.0)
Immature Granulocytes: 0 %
Lymphocytes Relative: 23 %
Lymphs Abs: 1.4 10*3/uL (ref 0.7–4.0)
MCH: 29 pg (ref 26.0–34.0)
MCHC: 32.6 g/dL (ref 30.0–36.0)
MCV: 89.1 fL (ref 80.0–100.0)
Monocytes Absolute: 0.4 10*3/uL (ref 0.1–1.0)
Monocytes Relative: 7 %
Neutro Abs: 4.1 10*3/uL (ref 1.7–7.7)
Neutrophils Relative %: 68 %
Platelets: 170 10*3/uL (ref 150–400)
RBC: 4.58 MIL/uL (ref 3.87–5.11)
RDW: 12.7 % (ref 11.5–15.5)
WBC: 6 10*3/uL (ref 4.0–10.5)
nRBC: 0 % (ref 0.0–0.2)

## 2024-01-03 LAB — IRON AND TIBC
Iron: 87 ug/dL (ref 28–170)
Saturation Ratios: 31 % (ref 10.4–31.8)
TIBC: 282 ug/dL (ref 250–450)
UIBC: 195 ug/dL

## 2024-01-03 LAB — FOLATE: Folate: 3.1 ng/mL — ABNORMAL LOW (ref >5.9)

## 2024-01-03 LAB — VITAMIN D 25 HYDROXY (VIT D DEFICIENCY, FRACTURES): Vit D, 25-Hydroxy: 19.03 ng/mL — ABNORMAL LOW (ref 30–100)

## 2024-01-03 LAB — VITAMIN B12: Vitamin B-12: 467 pg/mL (ref 180–914)

## 2024-01-03 LAB — FERRITIN: Ferritin: 264 ng/mL (ref 11–307)

## 2024-01-06 LAB — METHYLMALONIC ACID, SERUM: Methylmalonic Acid, Quantitative: 161 nmol/L (ref 0–378)

## 2024-01-09 ENCOUNTER — Other Ambulatory Visit: Payer: Self-pay | Admitting: *Deleted

## 2024-01-10 ENCOUNTER — Inpatient Hospital Stay (HOSPITAL_BASED_OUTPATIENT_CLINIC_OR_DEPARTMENT_OTHER): Payer: Commercial Managed Care - PPO | Admitting: Oncology

## 2024-01-10 DIAGNOSIS — E559 Vitamin D deficiency, unspecified: Secondary | ICD-10-CM | POA: Diagnosis not present

## 2024-01-10 DIAGNOSIS — D508 Other iron deficiency anemias: Secondary | ICD-10-CM

## 2024-01-10 DIAGNOSIS — E538 Deficiency of other specified B group vitamins: Secondary | ICD-10-CM | POA: Diagnosis not present

## 2024-01-10 NOTE — Progress Notes (Signed)
 Virtual Visit via Telephone Note  I connected with Carol Bernard on 01/10/24 at  9:30 AM EST by telephone and verified that I am speaking with the correct person using two identifiers.  Location: Patient: Home Provider: Clinic    I discussed the limitations, risks, security and privacy concerns of performing an evaluation and management service by telephone and the availability of in person appointments. I also discussed with the patient that there may be a patient responsible charge related to this service. The patient expressed understanding and agreed to proceed.   History of Present Illness: Carol Bernard is a 29 year old female with past medical history significant for iron  deficiency anemia who receives intermittent IV iron  infusions.  She was last seen in clinic on 08/30/2023.  She last received IV Venofer  on 09/13/2023, 10/17/2023 and 10/31/2023.  Tolerated iron  infusions well.  Reports that she like to spreading out the IV iron  infusions and feels like they will stay with her longer.  She continues iron  supplements as well.  Reports some improvement of her energy levels since receiving IV iron .  Reports she still having some dizziness and generalized weakness but overall feels better.  She denies any bleeding episodes, melena or hematochezia.  Her menstrual cycles are normal.  She eats a variety in her diet although she is gluten intolerant.  Previously, was instructed to take vitamin D  and folic acid  supplements which she still takes but were decreased by previous provider.  Appetite is 100% energy level 75%.  She denies any pain.  Denies any recent hospitalizations, surgeries or changes to her baseline health.  Observations/Objective: Review of Systems  Constitutional:  Positive for malaise/fatigue. Negative for weight loss.  Gastrointestinal:  Positive for constipation, nausea and vomiting.  Musculoskeletal:  Positive for joint pain.  Neurological:  Positive for dizziness  and headaches.    Physical Exam Neurological:     Mental Status: She is alert and oriented to person, place, and time.     Assessment and Plan: 1. Other iron  deficiency anemia (Primary) -Receives intermittent IV iron .  Last IV Venofer  given on 09/13/2023, 10/17/2023 and 10/31/2023. -She is taking oral iron  supplements daily. -Feels somewhat improved since IV iron  infusions a few months ago.  Reports she still has some generalized weakness and headaches but overall feels better. -Reviewed labs from 01/03/2024 which show an unremarkable CBC.'s hemoglobin 13.3 with normal platelets and white blood cell count.  Differential is normal.  CMP is also unremarkable.  Iron  saturations 31% with ferritin of 264. -No additional iron  needed at this time.  2. Vitamin D  deficiency -Has been on and off vitamin D  supplements for a few years now. -We discussed her vitamin D  level is severely low at 19.  Recommend restarting 5000 units of vitamin D  daily. -Recheck these labs in 4 to 6 months.  3. Folate deficiency -She has been on and off folic acid  as well.  -Labs from 01/03/2024 show folate level 3.1. -Recommend restarting folic acid  and taking consistently.  Follow Up Instructions: -Recheck labs in 4 to 6 months.  Patient will call with available dates and time.  This can be a virtual visit if she wishes.  Labs a week before.    I discussed the assessment and treatment plan with the patient. The patient was provided an opportunity to ask questions and all were answered. The patient agreed with the plan and demonstrated an understanding of the instructions.   The patient was advised to call back or seek an in-person  evaluation if the symptoms worsen or if the condition fails to improve as anticipated.  I provided 20 minutes of non-face-to-face time during this encounter.   Delon FORBES Hope, NP

## 2024-09-23 ENCOUNTER — Other Ambulatory Visit (HOSPITAL_BASED_OUTPATIENT_CLINIC_OR_DEPARTMENT_OTHER): Payer: Self-pay

## 2024-09-23 ENCOUNTER — Encounter: Payer: Self-pay | Admitting: Oncology

## 2024-09-23 MED ORDER — AMOXICILLIN 500 MG PO CAPS
500.0000 mg | ORAL_CAPSULE | Freq: Two times a day (BID) | ORAL | 0 refills | Status: AC
  Filled 2024-09-23: qty 14, 7d supply, fill #0
